# Patient Record
Sex: Female | Born: 1935
Health system: Southern US, Community
[De-identification: ages and names within clinical notes are randomized; demographics above are authoritative.]

## PROBLEM LIST (undated history)

## (undated) DIAGNOSIS — N183 Chronic kidney disease, stage 3 unspecified: Secondary | ICD-10-CM

## (undated) DIAGNOSIS — I2510 Atherosclerotic heart disease of native coronary artery without angina pectoris: Secondary | ICD-10-CM

## (undated) DIAGNOSIS — E785 Hyperlipidemia, unspecified: Secondary | ICD-10-CM

## (undated) DIAGNOSIS — G8929 Other chronic pain: Secondary | ICD-10-CM

## (undated) DIAGNOSIS — M545 Low back pain, unspecified: Secondary | ICD-10-CM

## (undated) DIAGNOSIS — M1990 Unspecified osteoarthritis, unspecified site: Secondary | ICD-10-CM

## (undated) DIAGNOSIS — I10 Essential (primary) hypertension: Secondary | ICD-10-CM

## (undated) DIAGNOSIS — E875 Hyperkalemia: Secondary | ICD-10-CM

## (undated) DIAGNOSIS — H409 Unspecified glaucoma: Secondary | ICD-10-CM

## (undated) DIAGNOSIS — E119 Type 2 diabetes mellitus without complications: Secondary | ICD-10-CM

## (undated) DIAGNOSIS — I5032 Chronic diastolic (congestive) heart failure: Secondary | ICD-10-CM

## (undated) HISTORY — DX: Hyperlipidemia, unspecified: E78.5

## (undated) HISTORY — PX: CATARACT EXTRACTION W/ INTRAOCULAR LENS  IMPLANT, BILATERAL: SHX1307

## (undated) HISTORY — DX: Hyperkalemia: E87.5

## (undated) HISTORY — DX: Unspecified osteoarthritis, unspecified site: M19.90

## (undated) HISTORY — DX: Chronic kidney disease, stage 3 unspecified: N18.30

## (undated) HISTORY — DX: Chronic diastolic (congestive) heart failure: I50.32

## (undated) HISTORY — DX: Morbid (severe) obesity due to excess calories: E66.01

## (undated) HISTORY — PX: TONSILLECTOMY: SUR1361

## (undated) HISTORY — DX: Essential (primary) hypertension: I10

---

## 1998-03-22 ENCOUNTER — Other Ambulatory Visit: Admission: RE | Admit: 1998-03-22 | Discharge: 1998-03-22 | Payer: Self-pay | Admitting: Family Medicine

## 1999-01-11 ENCOUNTER — Other Ambulatory Visit: Admission: RE | Admit: 1999-01-11 | Discharge: 1999-01-11 | Payer: Self-pay | Admitting: Internal Medicine

## 1999-05-15 ENCOUNTER — Ambulatory Visit (HOSPITAL_COMMUNITY): Admission: RE | Admit: 1999-05-15 | Discharge: 1999-05-15 | Payer: Self-pay | Admitting: *Deleted

## 2000-03-23 ENCOUNTER — Encounter: Payer: Self-pay | Admitting: Emergency Medicine

## 2000-03-23 ENCOUNTER — Emergency Department (HOSPITAL_COMMUNITY): Admission: EM | Admit: 2000-03-23 | Discharge: 2000-03-23 | Payer: Self-pay | Admitting: Emergency Medicine

## 2000-06-29 ENCOUNTER — Ambulatory Visit (HOSPITAL_COMMUNITY): Admission: RE | Admit: 2000-06-29 | Discharge: 2000-06-29 | Payer: Self-pay | Admitting: *Deleted

## 2001-02-04 ENCOUNTER — Other Ambulatory Visit: Admission: RE | Admit: 2001-02-04 | Discharge: 2001-02-04 | Payer: Self-pay | Admitting: Family Medicine

## 2001-03-22 ENCOUNTER — Ambulatory Visit (HOSPITAL_COMMUNITY): Admission: RE | Admit: 2001-03-22 | Discharge: 2001-03-22 | Payer: Self-pay | Admitting: Ophthalmology

## 2001-06-04 ENCOUNTER — Ambulatory Visit (HOSPITAL_COMMUNITY): Admission: RE | Admit: 2001-06-04 | Discharge: 2001-06-04 | Payer: Self-pay | Admitting: Ophthalmology

## 2001-07-07 ENCOUNTER — Encounter: Payer: Self-pay | Admitting: Ophthalmology

## 2001-07-09 ENCOUNTER — Ambulatory Visit (HOSPITAL_COMMUNITY): Admission: RE | Admit: 2001-07-09 | Discharge: 2001-07-09 | Payer: Self-pay | Admitting: Ophthalmology

## 2002-04-26 ENCOUNTER — Other Ambulatory Visit: Admission: RE | Admit: 2002-04-26 | Discharge: 2002-04-26 | Payer: Self-pay | Admitting: Family Medicine

## 2003-05-17 ENCOUNTER — Ambulatory Visit (HOSPITAL_COMMUNITY): Admission: RE | Admit: 2003-05-17 | Discharge: 2003-05-17 | Payer: Self-pay | Admitting: Ophthalmology

## 2003-06-09 ENCOUNTER — Ambulatory Visit (HOSPITAL_COMMUNITY): Admission: RE | Admit: 2003-06-09 | Discharge: 2003-06-09 | Payer: Self-pay | Admitting: Internal Medicine

## 2003-09-28 ENCOUNTER — Ambulatory Visit (HOSPITAL_COMMUNITY): Admission: RE | Admit: 2003-09-28 | Discharge: 2003-09-28 | Payer: Self-pay | Admitting: Internal Medicine

## 2004-05-28 ENCOUNTER — Ambulatory Visit: Payer: Self-pay | Admitting: Nurse Practitioner

## 2004-06-03 ENCOUNTER — Ambulatory Visit (HOSPITAL_COMMUNITY): Admission: RE | Admit: 2004-06-03 | Discharge: 2004-06-03 | Payer: Self-pay | Admitting: Internal Medicine

## 2004-08-28 ENCOUNTER — Ambulatory Visit: Payer: Self-pay | Admitting: Nurse Practitioner

## 2004-12-05 ENCOUNTER — Ambulatory Visit: Payer: Self-pay | Admitting: Nurse Practitioner

## 2004-12-13 ENCOUNTER — Ambulatory Visit: Payer: Self-pay | Admitting: Nurse Practitioner

## 2004-12-13 ENCOUNTER — Other Ambulatory Visit: Admission: RE | Admit: 2004-12-13 | Discharge: 2004-12-13 | Payer: Self-pay | Admitting: Family Medicine

## 2004-12-24 ENCOUNTER — Ambulatory Visit (HOSPITAL_COMMUNITY): Admission: RE | Admit: 2004-12-24 | Discharge: 2004-12-24 | Payer: Self-pay | Admitting: Internal Medicine

## 2005-04-04 ENCOUNTER — Emergency Department (HOSPITAL_COMMUNITY): Admission: EM | Admit: 2005-04-04 | Discharge: 2005-04-04 | Payer: Self-pay | Admitting: Emergency Medicine

## 2005-07-28 ENCOUNTER — Ambulatory Visit: Payer: Self-pay | Admitting: Nurse Practitioner

## 2005-08-15 ENCOUNTER — Other Ambulatory Visit: Admission: RE | Admit: 2005-08-15 | Discharge: 2005-08-15 | Payer: Self-pay | Admitting: Family Medicine

## 2005-08-15 ENCOUNTER — Ambulatory Visit: Payer: Self-pay | Admitting: Family Medicine

## 2005-08-20 ENCOUNTER — Ambulatory Visit: Payer: Self-pay | Admitting: Nurse Practitioner

## 2005-09-05 ENCOUNTER — Ambulatory Visit: Payer: Self-pay | Admitting: Family Medicine

## 2005-09-16 ENCOUNTER — Ambulatory Visit (HOSPITAL_COMMUNITY): Admission: RE | Admit: 2005-09-16 | Discharge: 2005-09-16 | Payer: Self-pay | Admitting: Internal Medicine

## 2005-11-28 ENCOUNTER — Ambulatory Visit: Payer: Self-pay | Admitting: Gynecology

## 2005-11-28 ENCOUNTER — Other Ambulatory Visit: Admission: RE | Admit: 2005-11-28 | Discharge: 2005-11-28 | Payer: Self-pay | Admitting: Gynecology

## 2005-12-19 ENCOUNTER — Ambulatory Visit: Payer: Self-pay | Admitting: Nurse Practitioner

## 2005-12-30 ENCOUNTER — Encounter: Admission: RE | Admit: 2005-12-30 | Discharge: 2005-12-30 | Payer: Self-pay

## 2006-01-29 ENCOUNTER — Ambulatory Visit: Payer: Self-pay | Admitting: Nurse Practitioner

## 2008-01-21 ENCOUNTER — Ambulatory Visit (HOSPITAL_COMMUNITY): Admission: RE | Admit: 2008-01-21 | Discharge: 2008-01-21 | Payer: Self-pay | Admitting: Obstetrics and Gynecology

## 2008-01-21 ENCOUNTER — Encounter (INDEPENDENT_AMBULATORY_CARE_PROVIDER_SITE_OTHER): Payer: Self-pay | Admitting: Obstetrics and Gynecology

## 2008-08-29 ENCOUNTER — Encounter: Admission: RE | Admit: 2008-08-29 | Discharge: 2008-08-29 | Payer: Self-pay | Admitting: Internal Medicine

## 2011-01-28 NOTE — Op Note (Signed)
NAMESHAI, RASMUSSEN          ACCOUNT NO.:  [id number]   MEDICAL RECORD NO.:  [id number]          PATIENT TYPE:  AMB   LOCATION:  SDC                           [facility]ACILITY:  [facility]   PHYSICIAN:  Osborn Coho, M.D.   DATE OF BIRTH:  09/09/1936   DATE OF PROCEDURE:  01/21/2008  DATE OF DISCHARGE:                               OPERATIVE REPORT   PREOPERATIVE DIAGNOSES:  1. Atypical glandular cells of undetermined significance.  2. Atypical squamous cells of undetermined significance.  3. Polyp.   POSTOPERATIVE DIAGNOSES:  1. Atypical glandular cells of undetermined significance.  2. Atypical squamous cells of undetermined significance.  3. Polyp.   PROCEDURE:  1. Hysteroscopy.  2. Dilation and curettage.  3. Polypectomy.   ATTENDING:  Osborn Coho, MD   ANESTHESIA:  MAC and paracervical block.   SPECIMENS TO PATHOLOGY:  1. Polyp.  2. Endometrial curettings.   FLUIDS:  1000 mL.   URINE OUTPUT:  Straight cath prior to procedure with quantity  sufficient.  Hysteroscopic fluid deficit of 50 mL of sorbitol.   ESTIMATED BLOOD LOSS:  Minimal.   COMPLICATIONS:  None.   PROCEDURE:  The patient was taken to the operating room after risks,  benefits, and alternatives were reviewed with the patient.  The patient  verbalized understanding and consent was signed and witnessed.  The  patient was given a MAC per anesthesia and prepped and draped in the  normal sterile fashion.  A bivalve speculum was placed in the patient's  vagina and a paracervical block was administered using a total of 20 mL  of 1% lidocaine and the anterior lip of the cervix was grasped with a  single-tooth tenaculum.  The cervix was dilated for passage of the  hysteroscope.  Hysteroscope was introduced and a large polyp noted and  the base was attached to the anterior wall adjacent to the fundus.  Polypectomy was performed and most of the polyp was removed.  A small  portion of the base was still remaining.   Sharp curettage was performed  and the base was then removed and sent to pathology with the rest of the  polyp.  Curettage was performed, so a good texture was noted and  curettings sent to pathology.  Hysteroscope was introduced again and no  obvious remaining polyp was noticed.  Curettage was performed 1 last  time to remove any remaining debris.  The tenaculum was removed and  there was a small amount of bleeding at the right tenaculum site, which  was made hemostatic with silver nitrate and pressure.  All instruments  were removed.  The patient tolerated the procedure well and was awaiting  to transfer to the recovery room in good condition.      Osborn Coho, M.D.  Electronically Signed     AR/MEDQ  D:  01/21/2008  T:  01/22/2008  Job:  [id number]

## 2011-01-31 NOTE — Op Note (Signed)
[place]. [facility]  Patient:    Sarah Weaver, Sarah Weaver Visit Number: [id number] MRN: [id number]          Service Type: DSU Location: [facility] 28[address] 17 Attending Physician:  Karenann Cai Dictated by:   Marya Landry Carlyle Lipa., M.D. Proc. Date: 06/04/01 Admit Date:  06/04/2001                             Operative Report  PREOPERATIVE DIAGNOSIS:  Opaque posterior capsule right eye.  POSTOPERATIVE DIAGNOSIS:  Opaque posterior capsule right eye.  OPERATION:  YAG laser capsulotomy.  JUSTIFICATION FOR PROCEDURE:  This 75 year old lady underwent a cataract extraction on the right eye several months ago.  She has developed opacification of the posterior capsule with a drop in her visual acuity.  She had a visual acuity at this time as best corrected at 20/40.  She complains of difficulty seeing to read.  She was evaluated and found to have an opaque posterior capsule, and YAG laser capsulotomy is recommended. She is admitted at this time for that purpose.  DESCRIPTION OF PROCEDURE:  The patient was brought to the laser room, positioned appropriately behind the laser.  Approximately 13 applications of laser energy at 1.9 millijoules were applied to the posterior capsule obtaining an opening of 3 to 4 mm.  The patient tolerated the procedure well and was discharged in satisfactory condition with instructions to see me in the office this afternoon for evaluation of her intraocular pressure.  DISCHARGE DIAGNOSIS: Opaque posterior right eye. Dictated by:   Marya Landry Carlyle Lipa., M.D. Attending Physician:  Karenann Cai DD:  06/04/01 TD:  06/04/01 Job: [id number]

## 2011-01-31 NOTE — Op Note (Signed)
[place]. [facility]  Patient:    Sarah Weaver, Sarah Weaver Visit Number: [id number] MRN: [id number]          Service Type: DSU Location: [facility] 28[address] 22 Attending Physician:  Karenann Cai Dictated by:   Marya Landry Carlyle Lipa., M.D. Admit Date:  07/09/2001 Discharge Date: 07/09/2001                             Operative Report  PREOPERATIVE DIAGNOSIS:  Immature cataract, left eye.  POSTOPERATIVE DIAGNOSIS:  Immature cataract, left eye.  PROCEDURE:  Kelman phacoemulsification of cataract, left eye.  ANESTHESIA:  Local using Xylocaine 2% with Marcaine 0.75% and Wydase.  JUSTIFICATION FOR PROCEDURE:  This is a 75 year old lady who has had a cataract extraction from the right eye.  She has a cataract present in the left eye with a visual acuity best corrected at 20/60.  She complains of difficulty seeing to read.  Cataract extraction with intraocular lens implantation was recommended, and she is admitted at this time for that purpose.  DESCRIPTION OF PROCEDURE:  Under the influence of IV sedation, the patient was prepped and draped in the usual manner.  The lid speculum was inserted under the upper and lower lid of the left eye, and a 4-0 silk traction suture was passed through the belly of the superior rectus muscle for traction.  A fornix-based conjunctival flap was turned and hemostasis was achieved by using cautery.  An incision made in the sclera approximately 1 mm posterior to the limbus.  This incision was dissected down to clear cornea using the crescent blade.  A side port incision was made at the 1:30 clock hour position. Occucoat was injected into the eye through the side port incision.  The anterior chamber was then entered through the corneoscleral tunnel incision at the 11:30 position.  An anterior capsulotomy was done using a bent 25-gauge needle.  The nucleus was hydrodissected using Xylocaine.  The KPE handpiece was passed  into the eye, and the nucleus was emulsified without difficulty. The residual cortical material was aspirated.  The posterior capsule was polished using the olive-tip polisher.  The wound was then widened slightly to accommodate a foldable silicone lens.  This was injected into the eye behind the iris without difficulty.  The anterior chamber was reformed, and the pupil was constricted using Miochol.  The lips of the wound were hydrated with saline.  The wound was tested to make sure that it was airtight and watertight.  The conjunctiva was then closed over the wounds using thermal cautery.  Celestone 1 cc and 0.5 cc of gentamicin were injected subconjunctivally.  Maxitrol ophthalmic ointment and Pilopine ointment were applied along with a patch and Fox shield.  The patient tolerated the procedure well, was discharged to postanesthesia recovery room in satisfactory condition.  She was instructed to rest today, to take Vicodin every four hours as needed for pain, and to see me in the office tomorrow for further evaluation.  DISCHARGE DIAGNOSIS:  Immature cataract, left eye. Dictated by:   Marya Landry Carlyle Lipa., M.D. Attending Physician:  Karenann Cai DD:  07/10/01 TD:  07/12/01 Job: [id number]

## 2011-01-31 NOTE — Group Therapy Note (Signed)
NAMEDENNIS, Sarah Weaver          ACCOUNT NO.:  [id number]   MEDICAL RECORD NO.:  [id number]          PATIENT TYPE:  WOC   LOCATION:  [facility]                   FACILITY:  [facility]   PHYSICIAN:  Tinnie Gens, MD        DATE OF BIRTH:  Dec 12, 1935   DATE OF SERVICE:  09/05/2005                                    CLINIC NOTE   CHIEF COMPLAINT:  Follow-up biopsy results.   HISTORY OF PRESENT ILLNESS:  Patient is a 75 year old para 3, who was  referred from [facility] for evaluation and treatment of  postmenopausal bleeding.  On her last visit, she underwent endometrial  biopsy sampling, which revealed simple hyperplasia without atypia associated  with an endometrial polyp.   PHYSICAL EXAMINATION:  VITAL SIGNS:  As noted in the chart.  GENERAL: She is an obese black female in no acute distress.   IMPRESSION:  Simple hyperplasia and endometrial polyp.   PLAN:  Will try a chemical D&C given her poor health and probably not a good  surgical candidate with Provera 10 mg p.o. q. day for the next three months  followed by a follow-up endometrial biopsy at that time.  If this fails, D&C  would probably be the next option.  We will make that determination at her  next biopsy.           ______________________________  Tinnie Gens, MD     TP/MEDQ  D:  09/05/2005  T:  09/08/2005  Job:  [id number]   cc:   [facility]  [address]

## 2011-01-31 NOTE — Group Therapy Note (Signed)
Sarah Weaver, Sarah Weaver          ACCOUNT NO.:  [id number]   MEDICAL RECORD NO.:  [id number]          PATIENT TYPE:  WOC   LOCATION:  [facility]                   [facility]ACILITY[facility]   PHYSICIAN:  Tinnie Gens, MD        DATE OF BIRTH:  1936/06/04   DATE OF SERVICE:                                    CLINIC NOTE   CHIEF COMPLAINT:  Postmenopausal bleeding.   HISTORY OF PRESENT ILLNESS:  Patient is a 75 year old, para 3, who is  referred from [facility] for postmenopausal bleeding.  The patient  underwent pelvic sonography, which revealed an endometrial stripe of 1.8 cm,  and an endometrial biopsy was suggested.  The patient underwent this  ultrasound in April of 2006, and apparently, the result was just found by  her physician, and she is referred over for a biopsy today.   PAST MEDICAL HISTORY:  Her past medical history is fairly complicated, and  the patient did not fill out a GYN history form today.  However, she clearly  has elevated blood pressure, diabetes, and elevated cholesterol.   MEDICATIONS:  1.  Atenolol.  2.  Hydrochlorothiazide.  3.  Possibly Glyburide, but may be other medications.  She did not bring      them with her today.   ALLERGIES:  None known.   GYNECOLOGY HISTORY:  Underwent menopause in 1992, she believes, at age 41.  Last Pap was in 2005 and was normal.  Last mammogram was also in 2005 and  normal.   PHYSICAL EXAMINATION:  VITAL SIGNS: On exam today, her blood pressure is  184/98 without taking her meds.  Weight is 313 pounds.  GENERAL: She is an obese black female in no acute distress.  ABDOMEN:  Soft, non-tender, non-distended.  GENITOURINARY:  She has normal external female genitalia.  The vagina is  pink.  The cervix is parous and without lesions.  The uterus sounds to  approximately 7 cm.  Endometrial biopsy is taken without difficulty.   IMPRESSION:  1.  Postmenopausal bleeding.  2.  Multiple other medical problems.   PLAN:  1.   Endometrial biopsy today.  2.  Follow-up results in two weeks and then treatment after that.           ______________________________  Tinnie Gens, MD     TP/MEDQ  D:  08/15/2005  T:  08/16/2005  Job:  [id number]

## 2011-01-31 NOTE — Op Note (Signed)
[place]. [facility]  Patient:    Sarah Weaver, Sarah Weaver                   MRN: [id number] Proc. Date: 03/23/01 Attending:  Marya Landry. Carlyle Lipa., M.D.                           Operative Report  PREOPERATIVE DIAGNOSIS:  Immature cataract, right eye.  POSTOPERATIVE DIAGNOSIS:  Immature cataract, right eye.  OPERATION PERFORMED:  Kelman phacoemulsification of cataract, right eye, with intraocular lens implantation.  SURGEON:  Marya Landry. Carlyle Lipa., M.D.  ANESTHESIA:  Local using Xylocaine 2%, Marcaine 0.75% with Wydase.  INDICATIONS FOR PROCEDURE:  The patient is a 75 year old diabetic female who complains of blurring of vision with difficulty seeing to read and recognize faces.  Her vision is particularly blurred in the bright sun.  She was evaluated and found to have visual acuity best corrected at 20/60 on the right and 20/50 on the left.  There were bilateral posterior subcapsular cataract slightly worse on the right than on the left.  Cataract extraction with intraocular lens implantation in the right eye was recommended and she was admitted now for that purpose.  DESCRIPTION OF PROCEDURE:  Under the influence of IV sedation, the van Lint akinesia retrobulbar anesthesia was given.  The patient was prepped and draped in the usual manner.  The lid speculum was inserted under the upper and lower lid of the right eye and a 4-0 silk  traction suture was passed through the belly of the superior rectus muscle for traction.  A fornix based conjunctival flap was turned and hemostasis was used with cautery.  A grooved incision was made in the sclera approximately 1 mm posterior to the limbus.  This incision was dissected down into the clear cornea using crescent blade.  A side port incision was made at the 1:30 oclock position.  OcuCoat was injected into the eye through the side port incision.  The anterior chamber was then entered through the  corneoscleral tunnel incision at the 11:30 oclock position. Anterior capsulotomy was done using a bent 25 gauge needle.  The nucleus was hydrodissected using Xylocaine.  The KPE handpiece was passed into the eye and the nucleus was emulsified without difficulty.  The residual cortical material was aspirated.  The posterior capsule was polished using an olive tip polisher.  The wound was then widened slightly to accommodate a 5 x 6 oval intraocular lens.  This lens was seated into the eye behind the iris without difficulty.  The anterior chamber was reformed and the pupil was constricted using  Miochol.  The residual OcuCoat was aspirated from the eye.  The corneoscleral wound was then closed by using a single horizontal suture of 10-0 nylon.  It was ascertained that the wound was airtight and watertight and the conjunctiva was closed using thermal cautery.  1 cc of Celestone and 0.5 cc of gentamicin were injected subconjunctivally.  Maxitrol ophthalmic ointment and Pilopine ointment were applied along with a patch and Fox shield. The patient tolerated the procedure well and was discharged to the post anesthesia care unit in satisfactory condition.  She was instructed to rest today, to take Vicodin every four hours as needed for pain and to see me in the office tomorrow for further evaluation.  DISCHARGE DIAGNOSIS:  Immature cataract right eye.DD:  03/23/01 TD:  03/23/01 Job: [id number]

## 2011-01-31 NOTE — Group Therapy Note (Signed)
Sarah Weaver, Sarah Weaver          ACCOUNT NO.:  [id number]   MEDICAL RECORD NO.:  [id number]          PATIENT TYPE:  WOC   LOCATION:  [facility]                   FACILITY:  [facility]   PHYSICIAN:  Ginger Carne, MD DATE OF BIRTH:  Jan 23, 1936   DATE OF SERVICE:                                    CLINIC NOTE   REASON FOR CONSULTATION:  Followup for management of simple hyperplasia of  the endometrium without atypia.   HISTORY OF PRESENT ILLNESS:  This is a 75 year old para 3, who has undergone  a 28-month treatment with Provera 10 mg daily for non-atypical simple  hyperplasia of the endometrium.  The patient reports no active bleeding at  this time.   PHYSICAL EXAMINATION:  VITAL SIGNS:  Per chart.  PELVIC:  Cervix smooth without erosions.  A very small polyp at the 10  o'clock position was noted measuring about 3-4 mm and it was elected to  leave said polyp alone at this time.  An endometrial biopsy was obtained,  scant tissue noted.   IMPRESSION:  Non-atypical simple hyperplasia of the endometrium.   PLAN:  Endometrial biopsy sample will be sent to pathology and results  obtained.  If the patient's hyperplasia resolves, Provera will be  discontinued.  If hyperplasia is still present and/or bleeding recurs,  further management will be explored.  Dr. Shawnie Pons will be notified of said  findings.  The small polyp will also be discussed with Dr. Shawnie Pons.  From a  practical standpoint, it is unlikely this represents any significant lesion.           ______________________________  Ginger Carne, MD     SHB/MEDQ  D:  11/28/2005  T:  11/29/2005  Job:  [id number]

## 2011-03-21 ENCOUNTER — Emergency Department (HOSPITAL_COMMUNITY): Payer: PRIVATE HEALTH INSURANCE

## 2011-03-21 ENCOUNTER — Inpatient Hospital Stay (HOSPITAL_COMMUNITY)
Admission: EM | Admit: 2011-03-21 | Discharge: 2011-03-23 | DRG: 444 | Disposition: A | Discharge status: Home / Self Care | Payer: PRIVATE HEALTH INSURANCE | Attending: Family Medicine | Admitting: Family Medicine

## 2011-03-21 DIAGNOSIS — Z7982 Long term (current) use of aspirin: Secondary | ICD-10-CM

## 2011-03-21 DIAGNOSIS — K859 Acute pancreatitis without necrosis or infection, unspecified: Secondary | ICD-10-CM | POA: Diagnosis present

## 2011-03-21 DIAGNOSIS — N390 Urinary tract infection, site not specified: Secondary | ICD-10-CM | POA: Diagnosis present

## 2011-03-21 DIAGNOSIS — E785 Hyperlipidemia, unspecified: Secondary | ICD-10-CM | POA: Diagnosis present

## 2011-03-21 DIAGNOSIS — IMO0002 Reserved for concepts with insufficient information to code with codable children: Secondary | ICD-10-CM | POA: Diagnosis present

## 2011-03-21 DIAGNOSIS — I10 Essential (primary) hypertension: Secondary | ICD-10-CM | POA: Diagnosis present

## 2011-03-21 DIAGNOSIS — R42 Dizziness and giddiness: Secondary | ICD-10-CM | POA: Diagnosis present

## 2011-03-21 DIAGNOSIS — E860 Dehydration: Secondary | ICD-10-CM | POA: Diagnosis present

## 2011-03-21 DIAGNOSIS — K8020 Calculus of gallbladder without cholecystitis without obstruction: Principal | ICD-10-CM | POA: Diagnosis present

## 2011-03-21 DIAGNOSIS — K7689 Other specified diseases of liver: Secondary | ICD-10-CM | POA: Diagnosis present

## 2011-03-21 DIAGNOSIS — E1165 Type 2 diabetes mellitus with hyperglycemia: Secondary | ICD-10-CM | POA: Diagnosis present

## 2011-03-22 ENCOUNTER — Inpatient Hospital Stay (HOSPITAL_COMMUNITY): Payer: PRIVATE HEALTH INSURANCE

## 2011-03-22 LAB — URINE MICROSCOPIC-ADD ON

## 2011-03-22 LAB — COMPREHENSIVE METABOLIC PANEL
ALT: 22 U/L (ref 0–35)
AST: 19 U/L (ref 0–37)
Albumin: 3.5 g/dL (ref 3.5–5.2)
Alkaline Phosphatase: 77 U/L (ref 39–117)
BUN: 16 mg/dL (ref 6–23)
CO2: 24 mEq/L (ref 19–32)
Calcium: 9.3 mg/dL (ref 8.4–10.5)
Chloride: 100 mEq/L (ref 96–112)
Creatinine, Ser: 0.89 mg/dL (ref 0.50–1.10)
GFR calc Af Amer: >60 mL/min (ref >60)
GFR calc non Af Amer: >60 mL/min (ref >60)
Glucose, Bld: 214 mg/dL — ABNORMAL HIGH (ref 70–99)
Potassium: 4.6 mEq/L (ref 3.5–5.1)
Sodium: 135 mEq/L (ref 135–145)
Total Bilirubin: 0.3 mg/dL (ref 0.3–1.2)
Total Protein: 7 g/dL (ref 6.0–8.3)

## 2011-03-22 LAB — BASIC METABOLIC PANEL
BUN: 14 mg/dL (ref 6–23)
CO2: 28 mEq/L (ref 19–32)
Calcium: 9 mg/dL (ref 8.4–10.5)
Chloride: 102 mEq/L (ref 96–112)
Creatinine, Ser: 0.86 mg/dL (ref 0.50–1.10)
GFR calc Af Amer: >60 mL/min (ref >60)
GFR calc non Af Amer: >60 mL/min (ref >60)
Glucose, Bld: 210 mg/dL — ABNORMAL HIGH (ref 70–99)
Potassium: 4.4 mEq/L (ref 3.5–5.1)
Sodium: 138 mEq/L (ref 135–145)

## 2011-03-22 LAB — DIFFERENTIAL
Basophils Absolute: 0 10*3/uL (ref 0.0–0.1)
Basophils Absolute: 0 10*3/uL (ref 0.0–0.1)
Basophils Relative: 0 % (ref 0–1)
Basophils Relative: 0 % (ref 0–1)
Eosinophils Absolute: 0 10*3/uL (ref 0.0–0.7)
Eosinophils Absolute: 0 10*3/uL (ref 0.0–0.7)
Eosinophils Relative: 0 % (ref 0–5)
Eosinophils Relative: 1 % (ref 0–5)
Lymphocytes Relative: 18 % (ref 12–46)
Lymphocytes Relative: 41 % (ref 12–46)
Lymphs Abs: 1.3 10*3/uL (ref 0.7–4.0)
Lymphs Abs: 1.6 10*3/uL (ref 0.7–4.0)
Monocytes Absolute: 0.3 10*3/uL (ref 0.1–1.0)
Monocytes Absolute: 0.4 10*3/uL (ref 0.1–1.0)
Monocytes Relative: 5 % (ref 3–12)
Monocytes Relative: 7 % (ref 3–12)
Neutro Abs: 2 10*3/uL (ref 1.7–7.7)
Neutro Abs: 5.3 10*3/uL (ref 1.7–7.7)
Neutrophils Relative %: 51 % (ref 43–77)
Neutrophils Relative %: 76 % (ref 43–77)

## 2011-03-22 LAB — CBC
HCT: 33.1 % — ABNORMAL LOW (ref 36.0–46.0)
HCT: 35.7 % — ABNORMAL LOW (ref 36.0–46.0)
Hemoglobin: 11 g/dL — ABNORMAL LOW (ref 12.0–15.0)
Hemoglobin: 12.1 g/dL (ref 12.0–15.0)
MCH: 28.4 pg (ref 26.0–34.0)
MCH: 28.7 pg (ref 26.0–34.0)
MCHC: 33.2 g/dL (ref 30.0–36.0)
MCHC: 33.9 g/dL (ref 30.0–36.0)
MCV: 84.8 fL (ref 78.0–100.0)
MCV: 85.3 fL (ref 78.0–100.0)
Platelets: 199 10*3/uL (ref 150–400)
Platelets: 206 10*3/uL (ref 150–400)
RBC: 3.88 MIL/uL (ref 3.87–5.11)
RBC: 4.21 MIL/uL (ref 3.87–5.11)
RDW: 12.8 % (ref 11.5–15.5)
RDW: 12.9 % (ref 11.5–15.5)
WBC: 3.9 10*3/uL — ABNORMAL LOW (ref 4.0–10.5)
WBC: 7 10*3/uL (ref 4.0–10.5)

## 2011-03-22 LAB — CARDIAC PANEL(CRET KIN+CKTOT+MB+TROPI)
CK, MB: 2 ng/mL (ref 0.3–4.0)
CK, MB: 2.2 ng/mL (ref 0.3–4.0)
Relative Index: 1.8 (ref 0.0–2.5)
Relative Index: 1.6 (ref 0.0–2.5)
Total CK: 113 U/L (ref 7–177)
Total CK: 134 U/L (ref 7–177)
Troponin I: <0.3 ng/mL (ref <0.30)
Troponin I: <0.3 ng/mL (ref <0.30)

## 2011-03-22 LAB — URINE CULTURE
Colony Count: NO GROWTH
Culture  Setup Time: 201207070059
Culture: NO GROWTH

## 2011-03-22 LAB — HEMOGLOBIN A1C
Hgb A1c MFr Bld: 7.9 % — ABNORMAL HIGH (ref <5.7)
Mean Plasma Glucose: 180 mg/dL — ABNORMAL HIGH (ref <117)

## 2011-03-22 LAB — URINALYSIS, ROUTINE W REFLEX MICROSCOPIC
Bilirubin Urine: NEGATIVE
Glucose, UA: NEGATIVE mg/dL
Ketones, ur: NEGATIVE mg/dL
Nitrite: NEGATIVE
Protein, ur: 30 mg/dL — AB
Specific Gravity, Urine: 1.023 (ref 1.005–1.030)
Urobilinogen, UA: 0.2 mg/dL (ref 0.0–1.0)
pH: 5 (ref 5.0–8.0)

## 2011-03-22 LAB — GLUCOSE, CAPILLARY
Glucose-Capillary: 173 mg/dL — ABNORMAL HIGH (ref 70–99)
Glucose-Capillary: 133 mg/dL — ABNORMAL HIGH (ref 70–99)
Glucose-Capillary: 166 mg/dL — ABNORMAL HIGH (ref 70–99)

## 2011-03-22 LAB — CK TOTAL AND CKMB (NOT AT ARMC)
CK, MB: 1.8 ng/mL (ref 0.3–4.0)
Relative Index: 1.7 (ref 0.0–2.5)
Total CK: 108 U/L (ref 7–177)

## 2011-03-22 LAB — LIPID PANEL
Cholesterol: 166 mg/dL (ref 0–200)
HDL: 60 mg/dL (ref >39)
LDL Cholesterol: 90 mg/dL (ref 0–99)
Total CHOL/HDL Ratio: 2.8 RATIO
Triglycerides: 80 mg/dL (ref <150)
VLDL: 16 mg/dL (ref 0–40)

## 2011-03-22 LAB — LIPASE, BLOOD: Lipase: 70 U/L — ABNORMAL HIGH (ref 11–59)

## 2011-03-22 LAB — PROTIME-INR
INR: 1.03 (ref 0.00–1.49)
Prothrombin Time: 13.7 seconds (ref 11.6–15.2)

## 2011-03-22 LAB — APTT: aPTT: 29 seconds (ref 24–37)

## 2011-03-22 LAB — TSH: TSH: 2.043 u[IU]/mL (ref 0.350–4.500)

## 2011-03-23 LAB — CBC
HCT: 33.9 % — ABNORMAL LOW (ref 36.0–46.0)
Hemoglobin: 11.1 g/dL — ABNORMAL LOW (ref 12.0–15.0)
MCH: 28.2 pg (ref 26.0–34.0)
MCHC: 32.7 g/dL (ref 30.0–36.0)
MCV: 86 fL (ref 78.0–100.0)
Platelets: 181 10*3/uL (ref 150–400)
RBC: 3.94 MIL/uL (ref 3.87–5.11)
RDW: 12.9 % (ref 11.5–15.5)
WBC: 3.8 10*3/uL — ABNORMAL LOW (ref 4.0–10.5)

## 2011-03-23 LAB — DIFFERENTIAL
Basophils Absolute: 0 10*3/uL (ref 0.0–0.1)
Basophils Relative: 0 % (ref 0–1)
Eosinophils Absolute: 0.1 10*3/uL (ref 0.0–0.7)
Eosinophils Relative: 2 % (ref 0–5)
Lymphocytes Relative: 48 % — ABNORMAL HIGH (ref 12–46)
Lymphs Abs: 1.8 10*3/uL (ref 0.7–4.0)
Monocytes Absolute: 0.4 10*3/uL (ref 0.1–1.0)
Monocytes Relative: 11 % (ref 3–12)
Neutro Abs: 1.5 10*3/uL — ABNORMAL LOW (ref 1.7–7.7)
Neutrophils Relative %: 39 % — ABNORMAL LOW (ref 43–77)

## 2011-03-23 LAB — COMPREHENSIVE METABOLIC PANEL
ALT: 20 U/L (ref 0–35)
AST: 18 U/L (ref 0–37)
Albumin: 3.1 g/dL — ABNORMAL LOW (ref 3.5–5.2)
Alkaline Phosphatase: 64 U/L (ref 39–117)
BUN: 9 mg/dL (ref 6–23)
CO2: 27 mEq/L (ref 19–32)
Calcium: 8.7 mg/dL (ref 8.4–10.5)
Chloride: 104 mEq/L (ref 96–112)
Creatinine, Ser: 0.88 mg/dL (ref 0.50–1.10)
GFR calc Af Amer: >60 mL/min (ref >60)
GFR calc non Af Amer: >60 mL/min (ref >60)
Glucose, Bld: 136 mg/dL — ABNORMAL HIGH (ref 70–99)
Potassium: 4 mEq/L (ref 3.5–5.1)
Sodium: 139 mEq/L (ref 135–145)
Total Bilirubin: 0.3 mg/dL (ref 0.3–1.2)
Total Protein: 6.1 g/dL (ref 6.0–8.3)

## 2011-03-23 LAB — GLUCOSE, CAPILLARY
Glucose-Capillary: 145 mg/dL — ABNORMAL HIGH (ref 70–99)
Glucose-Capillary: 257 mg/dL — ABNORMAL HIGH (ref 70–99)

## 2011-03-23 LAB — LIPASE, BLOOD: Lipase: 50 U/L (ref 11–59)

## 2011-03-23 LAB — HEMOGLOBIN A1C
Hgb A1c MFr Bld: 8.1 % — ABNORMAL HIGH (ref <5.7)
Mean Plasma Glucose: 186 mg/dL — ABNORMAL HIGH (ref <117)

## 2011-03-23 LAB — AMYLASE: Amylase: 55 U/L (ref 0–105)

## 2011-03-25 NOTE — Discharge Summary (Signed)
NAMESAKARA, Sarah Weaver          ACCOUNT NO.:  [id number]  MEDICAL RECORD NO.:  [id number]  LOCATION:  2019                         FACILITY:  [facility]  PHYSICIAN:  Standley Dakins, MD   DATE OF BIRTH:  Oct 10, 1935  DATE OF ADMISSION:  03/21/2011 DATE OF DISCHARGE:                              DISCHARGE SUMMARY   PRIMARY CARE PHYSICIAN:  Sarah Goo, MD  DISCHARGE DIAGNOSES: 1. Cholelithiasis - NO CHOLECYSTITIS DETERMINED. 2. Pancreatitis - acute secondary to cholelithiasis - resolved. 3. Morbid obesity. 4. Hypertension - poorly controlled. 5. Poorly controlled type 2 diabetes mellitus, non-insulin requiring. 6. Dyslipidemia. 7. Allergies. 8. Steatosis of the liver.  DISCHARGE MEDICATIONS: 1. Ciprofloxacin 500 mg 1 p.o. b.i.d. times additional 3 days. 2. Avapro 300 mg 1 p.o. daily. 3. Atenolol 50 mg 2 tabs p.o. daily. 4. Metformin 1000 mg p.o. b.i.d. 5. Clonidine 0.1 mg 0.5 tabs p.o. b.i.d. 6. Procardia XL 90 mg 1 p.o. daily. 7. Januvia 50 mg 1 p.o. q.a.m. 8. Simvastatin 40 mg 1 p.o. daily. 9. Aspirin 81 mg 1 p.o. daily. 10.Promethazine 25 mg 1 p.o. b.i.d. p.r.n. 11.Metoclopramide 10 mg 1 p.o. t.i.d. 12.Klor-Con 10 mEq 1 p.o. q.a.m. 13.Loratadine 10 mg 1 p.o. daily. 14.Glimepiride 1 p.o. q.a.m. 15.Hydrochlorothiazide 25 mg p.o. q.a.m.  HOSPITAL COURSE:  Briefly, this patient is a 75 year old female who presented to the emergency department with nausea and vomiting.  She was evaluated in the emergency department and had negative findings for acute intracranial hemorrhage and mass lesion.  No acute infarction noted.  The patient had results that revealed a mild urinary tract infection and an elevated lipase level at 70.  The patient was admitted to the hospital with diagnosis of pancreatitis.  She was treated with clear liquids and IV fluids for dehydration.  She was treated with Rocephin for the urinary tract infection.  She improved considerably.    I ordered  an ultrasound of her abdomen that revealed hepatic steatosis and cholelithiasis in the gallbladder, but NO cholecystitis.  The patient's lipase improved to normal levels on day of discharge and the patient tolerated regular diet and improved considerably.  The nausea and vomiting had also resolved.  At that point, the patient was deemed stable for discharge.  I explained to the patient that she will most likely need to be evaluated by surgeon for consideration of cholecystectomy.  Because she has multiple gallstones in the gallbladder, I advised her to avoid high fatty meals because that could irritate her gallbladder and could possibly cause the gallbladder attack.  The patient verbalized understanding.  She said that she would speak with her primary care physician to discuss having her gallbladder removed.  DISCHARGE CONDITION:  Stable.  DISPOSITION:  The patient will be discharged home with family members.  ACTIVITY: 1. Ad lib.  Fall precautions recommended. 2. Diet low fat cardiac and diabetic diet recommended3. Followup with Dr. Lovell Sheehan her primary care physician in 3-5 days. 4. Drink extra fluids.  SPECIAL INSTRUCTIONS: 1. Please discuss being referred to a surgeon for evaluation for     cholecystectomy with your primary care physician DR. JENKINS. 2. Avoid high fatty meals because this can aggravate gallstones and     cause a gallbladder  attack. 3. Return if symptoms recur, worsen or new changes develop.  The     patient verbalized understanding to these instructions. 4. Check blood sugars 1-2 times per day. 5. Discuss with your primary care physician about instituting basal     insulin to help control diabetes better. 6. Monitor blood pressure at least one time per day.  Record them and     bring them to the primary care physician's office for visit.  The     patient was evaluated on the day of discharge.     She was evaluated on March 23, 2011.    The patient was in no  distress, cooperative and pleasant.  She reported  that her dizziness has improved, but is still there.  She is not having   any abdominal pain and the nausea and vomiting has resolved.  PHYSICAL EXAMINATION:  VITAL SIGNS:  Were reviewed temperature 98.1, pulse 64, respirations 19, blood pressure 146/64 and pulse ox 97% on 2 L nasal cannula. GENERAL:  Well-developed, well-nourished female.  She is awake in no distress, cooperative and pleasant. HEENT:  Normocephalic, atraumatic.  Mucous membranes moist.  Oropharynx clear.  Nares clear. NECK:  Supple.  No lymphadenopathy, no JVD and no adenopathy.  Trachea midline.  Thyroid soft, no nodules or masses palpated. CARDIOVASCULAR:  Normal S1 and S2.  Sounds without murmurs, rubs or gallops. LUNGS:  Bilateral breath sounds.  Clear to auscultation. ABDOMEN:  Obese, soft, bowel sounds present, nontender and no masses palpated.  No hepatosplenomegaly appreciated. EXTREMITIES:  No cyanosis, clubbing or edema. NEUROLOGICAL:  No focal deficits.  LABORATORY DATA:  White blood cell count 3.8, hemoglobin 11.1, hematocrit 33.9, platelet count 181.  Lipase 50, amylase 55.  Sodium 139, potassium 4.0, chloride 104, bicarb 27, BUN 9, creatinine 0.88, AST 18, ALT 20, albumin 3.1, calcium 8.7.  IMPRESSION:  The patient is evaluated and deemed stable for discharge with close followup with her primary care provider.  I spent 37 minutes preparing this patient's discharge including reviewing her medical records, laboratory data, reconciling medications.     Standley Dakins, MD     CJ/MEDQ  D:  03/23/2011  T:  03/23/2011  Job:  [id number]  cc:   Sarah Weaver, M.D.  Electronically Signed by Standley Dakins  on 03/25/2011 06:49:13 PM

## 2011-04-07 NOTE — H&P (Signed)
NAMEAYRIEL, TEXIDOR          ACCOUNT NO.:  [id number]  MEDICAL RECORD NO.:  [id number]  LOCATION:                                 FACILITY:  PHYSICIAN:  Della Goo, M.D. DATE OF BIRTH:  1936-05-14  DATE OF ADMISSION:  03/22/2011 DATE OF DISCHARGE:                             HISTORY & PHYSICAL   PRIMARY CARE PHYSICIAN:  Della Goo, MD  CHIEF COMPLAINT:  Nausea, vomiting.  HISTORY OF PRESENT ILLNESS:  This is a 75 year old female with a history of uncontrolled diabetes and uncontrolled hypertension who presents to the emergency department with complaints of worsening nausea and vomiting over the past 2 weeks.  She denies having any abdominal pain. She denies having any fevers or chills.  She also denies having any diarrhea or constipation.  The patient reports being able to hold down a little bit food today prior to coming to the emergency department.  The patient also reports having dizziness and feeling as if she has motion sickness.  She denies having any ear pain or nasal congestion.  The patient was seen and evaluated in the emergency department and a CAT scan of her head was performed, results of which were negative for findings of acute intracranial hemorrhage, mass lesion, or acute infarction.  Laboratory studies were also performed, results of which revealed a mild urinary tract infection versus contamination and an elevated lipase level at 70, the normal range is 11-59.  The patient was referred for medical admission.  PAST MEDICAL HISTORY:  Significant for: 1. Uncontrolled hypertension. 2. Uncontrolled diabetes, type 2. 3. Morbid obesity. 4. Dyslipidemia. 5. Allergies.  MEDICATIONS:  Aspirin, atenolol, Avapro, clonidine, glimepiride, Glucophage, HydroDIURIL, Januvia, Klor-Con, loratadine, metoclopramide, Phenergan, Procardia, and simvastatin.  ALLERGIES:  No known drug allergies.  SOCIAL HISTORY:  The patient is a nonsmoker, nondrinker.  No  history of illicit drug usage.  FAMILY HISTORY:  Noncontributory.  REVIEW OF SYSTEMS:  Pertinents as mentioned above.  PHYSICAL EXAMINATION FINDINGS:  GENERAL:  This is a 74 year old morbidly obese African American female who is in no visible discomfort or acute distress. VITAL SIGNS:  Temperature 98.0, blood pressure 149/81, heart rate 75, respirations 20, O2 sats 85% initially, then 97-98%. HEENT:  Normocephalic, atraumatic.  Pupils equally round and reactive to light.  Extraocular movements are intact.  Funduscopic benign.  There is no scleral icterus.  Nares are patent bilaterally.  Oropharynx is clear. Tympanic membranes were also clear but small fluid levels were seen. NECK:  Supple.  Full range of motion.  No thyromegaly, adenopathy, jugular venous distention. CARDIOVASCULAR:  Regular rate and rhythm.  No murmurs, gallops, or rubs appreciated. LUNGS:  Clear to auscultation bilaterally.  No rales, rhonchi, or wheezes. ABDOMEN:  Positive bowel sounds, soft, nontender, nondistended.  No hepatosplenomegaly. EXTREMITIES:  Without cyanosis, clubbing, or edema. NEUROLOGIC:  Nonfocal.  LABORATORY STUDIES:  White blood cell count 7.0, hemoglobin 12.1, hematocrit 35.7, MCV 84.8, platelets 206, neutrophils 76%, lymphocytes 18%, and a urinalysis as mentioned above.  Sodium 135, potassium 4.6, chloride 100, CO2 is 24, BUN 16, creatinine 0.89, and glucose 214, lipase 70.  Pro time 13.7, INR 1.03, PTT 29.  ASSESSMENT:  This is a 75 year old female being admitted with: 1.  Nausea and vomiting. 2. Uncontrolled type 2 diabetes mellitus. 3. Uncontrolled hypertension. 4. Urinary tract infection. 5. Morbid obesity. 6. Dizziness/vertigo.  PLAN:  The patient will be admitted to telemetry for monitoring. Cardiac enzymes will be performed.  The patient will be placed on antibiotic therapy to cover a urinary tract infection and her regular medications will be further reconciled and sliding  scale insulin coverage will also be ordered.  DVT prophylaxis has also been ordered. Further workup will ensue pending results and the patient's clinical course.     Della Goo, M.D.   ______________________________ Della Goo, M.D.    HJ/MEDQ  D:  03/22/2011  T:  03/22/2011  Job:  [id number]  Electronically Signed by Della Goo M.D. on 04/07/2011 12:05:00 PM

## 2011-04-14 ENCOUNTER — Ambulatory Visit (INDEPENDENT_AMBULATORY_CARE_PROVIDER_SITE_OTHER): Payer: PRIVATE HEALTH INSURANCE | Admitting: General Surgery

## 2011-04-14 ENCOUNTER — Encounter (INDEPENDENT_AMBULATORY_CARE_PROVIDER_SITE_OTHER): Payer: Self-pay | Admitting: General Surgery

## 2011-04-14 DIAGNOSIS — K8020 Calculus of gallbladder without cholecystitis without obstruction: Secondary | ICD-10-CM | POA: Insufficient documentation

## 2011-04-14 NOTE — Progress Notes (Signed)
Subjective:     Patient ID: Sarah Weaver, female   DOB: 08-18-1936, 75 y.o.   MRN: [id number]  HPI We are asked to see the patient comes placement l Lovell Sheehan to allow her for gallstones. Chief complaint is nausea and vomiting. The patient is a 75 year old black female who has been experiencing intermittent nausea and vomiting for the last month or so. She denies any abdominal pain. She was actually hospitalized a couple weeks ago for this. As part of her workup she underwent an ultrasound. This showed stones in the gallbladder but no gallbladder wall thickening or ductal dilatation.  Review of Systems  Constitutional: Negative.   HENT: Negative.   Eyes: Negative.   Respiratory: Negative.   Cardiovascular: Negative.   Gastrointestinal: Negative.   Genitourinary: Negative.   Musculoskeletal: Negative.   Skin: Negative.   Neurological: Negative.   Hematological: Negative.   Psychiatric/Behavioral: Negative.    Past Medical History  Diagnosis Date  . Hyperlipidemia   . Hypertension   . Glaucoma   . Arthritis    Past Surgical History  Procedure Date  . Tonsillectomy    Current outpatient prescriptions:atenolol (TENORMIN) 50 MG tablet, Take 50 mg by mouth 2 (two) times daily.  , Disp: , Rfl: ;  brinzolamide (AZOPT) 1 % ophthalmic suspension, 1 drop every 3 (three) days.  , Disp: , Rfl: ;  cloNIDine (CATAPRES) 0.1 MG tablet, Take 0.1 mg by mouth 2 (two) times daily.  , Disp: , Rfl: ;  glimepiride (AMARYL) 4 MG tablet, Take 4 mg by mouth daily before breakfast.  , Disp: , Rfl:  hydrochlorothiazide 25 MG tablet, Take 25 mg by mouth daily.  , Disp: , Rfl: ;  HYDROcodone-acetaminophen (VICODIN) 5-500 MG per tablet, Take 1 tablet by mouth every 6 (six) hours as needed.  , Disp: , Rfl: ;  irbesartan (AVAPRO) 300 MG tablet, Take 300 mg by mouth at bedtime.  , Disp: , Rfl: ;  latanoprost (XALATAN) 0.005 % ophthalmic solution, 1 drop as needed. Every 12 days  , Disp: , Rfl:  loratadine  (CLARITIN) 10 MG tablet, Take 10 mg by mouth daily.  , Disp: , Rfl: ;  metFORMIN (GLUMETZA) 1000 MG (MOD) 24 hr tablet, Take 1,000 mg by mouth 2 (two) times daily with a meal.  , Disp: , Rfl: ;  NIFEdipine (ADALAT CC) 90 MG 24 hr tablet, Take 90 mg by mouth daily.  , Disp: , Rfl: ;  potassium chloride (KLOR-CON) 10 MEQ CR tablet, Take 10 mEq by mouth daily.  , Disp: , Rfl:  simvastatin (ZOCOR) 40 MG tablet, Take 40 mg by mouth at bedtime.  , Disp: , Rfl: ;  sitaGLIPtin (JANUVIA) 50 MG tablet, Take 50 mg by mouth daily.  , Disp: , Rfl: ;  timolol (TIMOPTIC) 0.5 % ophthalmic solution, 1 drop every 3 (three) days.  , Disp: , Rfl: ;  traMADol (ULTRAM) 50 MG tablet, Take 50 mg by mouth every 6 (six) hours as needed.  , Disp: , Rfl:   No Known Allergies     Objective:   Physical Exam  Constitutional: She is oriented to person, place, and time. She appears well-developed and well-nourished.  HENT:  Head: Normocephalic and atraumatic.  Eyes: Conjunctivae and EOM are normal. Pupils are equal, round, and reactive to light.  Neck: Normal range of motion. Neck supple.  Cardiovascular: Normal rate, regular rhythm and normal heart sounds.   Pulmonary/Chest: Effort normal and breath sounds normal.  Abdominal: Soft. Bowel  sounds are normal. There is tenderness.  Musculoskeletal: Normal range of motion.  Neurological: She is alert and oriented to person, place, and time.  Skin: Skin is warm and dry.  Psychiatric: She has a normal mood and affect. Her behavior is normal.       Assessment:     Probable symptomatic gallstones.    Plan:     Because of the risk of further episodes as well as possible pancreatitis I think she would benefit from having her gallbladder removed. She would also like to have this done. I've discussed in detail the risks and benefits of the operations to remove the gallbladder as well as some technical aspects and she understands and wishes to proceed.

## 2011-04-21 ENCOUNTER — Encounter (HOSPITAL_COMMUNITY)
Admission: RE | Admit: 2011-04-21 | Discharge: 2011-04-21 | Disposition: A | Discharge status: Home / Self Care | Payer: PRIVATE HEALTH INSURANCE | Source: Ambulatory Visit | Attending: General Surgery | Admitting: General Surgery

## 2011-04-21 LAB — DIFFERENTIAL
Basophils Absolute: 0 10*3/uL (ref 0.0–0.1)
Basophils Relative: 0 % (ref 0–1)
Eosinophils Absolute: 0.1 10*3/uL (ref 0.0–0.7)
Eosinophils Relative: 1 % (ref 0–5)
Lymphocytes Relative: 38 % (ref 12–46)
Lymphs Abs: 1.7 10*3/uL (ref 0.7–4.0)
Monocytes Absolute: 0.4 10*3/uL (ref 0.1–1.0)
Monocytes Relative: 8 % (ref 3–12)
Neutro Abs: 2.4 10*3/uL (ref 1.7–7.7)
Neutrophils Relative %: 53 % (ref 43–77)

## 2011-04-21 LAB — SURGICAL PCR SCREEN
MRSA, PCR: NEGATIVE
Staphylococcus aureus: NEGATIVE

## 2011-04-21 LAB — COMPREHENSIVE METABOLIC PANEL
ALT: 15 U/L (ref 0–35)
AST: 15 U/L (ref 0–37)
Albumin: 3.7 g/dL (ref 3.5–5.2)
Alkaline Phosphatase: 58 U/L (ref 39–117)
BUN: 19 mg/dL (ref 6–23)
CO2: 28 mEq/L (ref 19–32)
Calcium: 10 mg/dL (ref 8.4–10.5)
Chloride: 103 mEq/L (ref 96–112)
Creatinine, Ser: 1 mg/dL (ref 0.50–1.10)
GFR calc Af Amer: >60 mL/min (ref >60)
GFR calc non Af Amer: 54 mL/min — ABNORMAL LOW (ref >60)
Glucose, Bld: 162 mg/dL — ABNORMAL HIGH (ref 70–99)
Potassium: 3.9 mEq/L (ref 3.5–5.1)
Sodium: 141 mEq/L (ref 135–145)
Total Bilirubin: 0.4 mg/dL (ref 0.3–1.2)
Total Protein: 6.9 g/dL (ref 6.0–8.3)

## 2011-04-21 LAB — CBC
HCT: 35.8 % — ABNORMAL LOW (ref 36.0–46.0)
Hemoglobin: 11.9 g/dL — ABNORMAL LOW (ref 12.0–15.0)
MCH: 28 pg (ref 26.0–34.0)
MCHC: 33.2 g/dL (ref 30.0–36.0)
MCV: 84.2 fL (ref 78.0–100.0)
Platelets: 210 10*3/uL (ref 150–400)
RBC: 4.25 MIL/uL (ref 3.87–5.11)
RDW: 13 % (ref 11.5–15.5)
WBC: 4.6 10*3/uL (ref 4.0–10.5)

## 2011-04-23 ENCOUNTER — Other Ambulatory Visit (INDEPENDENT_AMBULATORY_CARE_PROVIDER_SITE_OTHER): Payer: Self-pay | Admitting: General Surgery

## 2011-04-23 ENCOUNTER — Ambulatory Visit (HOSPITAL_COMMUNITY): Payer: PRIVATE HEALTH INSURANCE

## 2011-04-23 ENCOUNTER — Ambulatory Visit (HOSPITAL_COMMUNITY)
Admission: RE | Admit: 2011-04-23 | Discharge: 2011-04-25 | Disposition: A | Discharge status: Home / Self Care | Payer: PRIVATE HEALTH INSURANCE | Source: Ambulatory Visit | Attending: General Surgery | Admitting: General Surgery

## 2011-04-23 DIAGNOSIS — E119 Type 2 diabetes mellitus without complications: Secondary | ICD-10-CM | POA: Insufficient documentation

## 2011-04-23 DIAGNOSIS — K8010 Calculus of gallbladder with chronic cholecystitis without obstruction: Secondary | ICD-10-CM

## 2011-04-23 DIAGNOSIS — Z01812 Encounter for preprocedural laboratory examination: Secondary | ICD-10-CM | POA: Insufficient documentation

## 2011-04-23 DIAGNOSIS — Z23 Encounter for immunization: Secondary | ICD-10-CM | POA: Insufficient documentation

## 2011-04-23 DIAGNOSIS — Z01810 Encounter for preprocedural cardiovascular examination: Secondary | ICD-10-CM | POA: Insufficient documentation

## 2011-04-23 DIAGNOSIS — E669 Obesity, unspecified: Secondary | ICD-10-CM | POA: Insufficient documentation

## 2011-04-23 DIAGNOSIS — I10 Essential (primary) hypertension: Secondary | ICD-10-CM | POA: Insufficient documentation

## 2011-04-23 DIAGNOSIS — M129 Arthropathy, unspecified: Secondary | ICD-10-CM | POA: Insufficient documentation

## 2011-04-23 HISTORY — PX: LAPAROSCOPIC CHOLECYSTECTOMY: SUR755

## 2011-04-23 LAB — GLUCOSE, CAPILLARY
Glucose-Capillary: 174 mg/dL — ABNORMAL HIGH (ref 70–99)
Glucose-Capillary: 163 mg/dL — ABNORMAL HIGH (ref 70–99)
Glucose-Capillary: 138 mg/dL — ABNORMAL HIGH (ref 70–99)
Glucose-Capillary: 139 mg/dL — ABNORMAL HIGH (ref 70–99)

## 2011-04-24 LAB — GLUCOSE, CAPILLARY
Glucose-Capillary: 153 mg/dL — ABNORMAL HIGH (ref 70–99)
Glucose-Capillary: 159 mg/dL — ABNORMAL HIGH (ref 70–99)
Glucose-Capillary: 172 mg/dL — ABNORMAL HIGH (ref 70–99)
Glucose-Capillary: 174 mg/dL — ABNORMAL HIGH (ref 70–99)

## 2011-04-25 LAB — GLUCOSE, CAPILLARY: Glucose-Capillary: 145 mg/dL — ABNORMAL HIGH (ref 70–99)

## 2011-05-13 ENCOUNTER — Encounter (INDEPENDENT_AMBULATORY_CARE_PROVIDER_SITE_OTHER): Payer: Self-pay | Admitting: General Surgery

## 2011-05-13 ENCOUNTER — Ambulatory Visit (INDEPENDENT_AMBULATORY_CARE_PROVIDER_SITE_OTHER): Payer: PRIVATE HEALTH INSURANCE | Admitting: General Surgery

## 2011-05-13 DIAGNOSIS — K8020 Calculus of gallbladder without cholecystitis without obstruction: Secondary | ICD-10-CM

## 2011-05-13 NOTE — Progress Notes (Signed)
Subjective:     Patient ID: Sarah Weaver, female   DOB: 1935/12/08, 75 y.o.   MRN: [id number]  HPI The patient is a 75 year old white female who is now 2 weeks out from a laparoscopic cholecystectomy for cholecystitis with cholelithiasis. Her postoperative course has been complicated only by some diarrhea. She is not running any fevers. She's not having any abdominal pain.  Review of Systems     Objective:   Physical Exam On exam her abdomen is soft and nontender. Her incisions are healing nicely.    Assessment:     2 weeks status post laparoscopic cholecystectomy.    Plan:     Because of the diarrhea we will plan to check her stool for Clostridium difficile. If this is negative then I think the diarrhea is simply a postop result of the cholecystectomy. Hopefully this will get better for her way from surgery she gets. If it does not we can start her on some cholestyramine. I will see her back in about 3 weeks.

## 2011-05-13 NOTE — Patient Instructions (Signed)
Avoid greasy foods Drink plenty of liquids F/U in 3 weeks

## 2011-05-13 NOTE — Op Note (Signed)
NAMEMARSA, MATTEO          ACCOUNT NO.:  [id number]  MEDICAL RECORD NO.:  [id number]  LOCATION:  5122                         FACILITY:  [facility]  PHYSICIAN:  Ollen Gross. Vernell Morgans, M.D. DATE OF BIRTH:  Jan 06, 1936  DATE OF PROCEDURE:  04/23/2011 DATE OF DISCHARGE:                              OPERATIVE REPORT   PREOPERATIVE DIAGNOSIS:  Gallstones.  POSTOPERATIVE DIAGNOSIS:  Gallstones.  PROCEDURE:  Laparoscopic cholecystectomy with intraoperative cholangiogram.  SURGEON:  Ollen Gross. Vernell Morgans, MD  ASSISTANT:  Wilmon Arms. Tsuei, MD  ANESTHESIA:  General endotracheal.  PROCEDURE IN DETAIL:  After informed consent was obtained, the patient was brought to the operating room and placed in supine position on the operating room table.  After adequate induction of general anesthesia, the patient's abdomen was prepped with ChloraPrep, allowed to dry and draped in usual sterile manner.  The area below the umbilicus was infiltrated with 0.25% Marcaine.  A small incision was made with a #15 blade knife.  This incision was carried down through the subcutaneous tissue bluntly with a hemostat and [employer] retractors until linea alba was identified.  The linea alba was incised with a #15 blade knife and each side was grasped with Kocher clamps and elevated anteriorly.  The preperitoneal space was then probed bluntly with the hemostat until the peritoneum was opened and access was gained to the abdominal cavity.  A 0-Vicryl pursestring stitch was placed in the fascia around the opening. A Hasson cannula was placed through the opening and anchored in place with the previously placed 0-Vicryl pursestring stitch.  The abdomen was then insufflated with carbon dioxide without difficulty.  The patient was placed in reverse Trendelenburg position and rotated with the right side up.  The laparoscope was inserted through the Hasson cannula and the right upper quadrant was inspected.  The dome of the  gallbladder and liver were readily identified.  The epigastric region was then infiltrated with 0.25% Marcaine.  A small incision was made with a #15 blade knife.  A 10-mm port was placed bluntly through this incision into the abdominal cavity under direct vision.  Two sites were chosen laterally on the right side of the abdomen with placement of 5-mm ports. Each of these areas were infiltrated with 0.25% Marcaine.  Small stab incisions were made with #15 blade knife and 5-mm ports were then placed bluntly through these incisions into the abdominal cavity under direct vision.  A blunt grasper was placed through the lateral-most 5-mm port and used to grasp the dome of gallbladder and elevated anteriorly and superiorly.  Another blunt grasper was placed through the other 5-mm port and used to retract on the body and neck of the gallbladder.  A dissector was placed through the epigastric port and using the electrocautery, the peritoneal reflection at the gallbladder neck was opened.  Blunt dissection was then carried out in this area until the gallbladder neck and cystic duct junction was readily identified and a good window was created.  The patient had a small anterior artery that was also dissected bluntly in a circumferential manner until a good window was created.  Two clips were placed proximally and one distally in the artery and the artery  was divided between the two.  A clip was then placed on the gallbladder neck.  A small ductotomy was made just below the clip with the laparoscopic scissors.  A 14-gauge Angiocath was placed percutaneously through the anterior abdominal wall under direct vision.  A Reddick cholangiogram catheter was placed through the Angiocath and flushed.  The Reddick catheter was then placed within the cystic duct and anchored in place with a clip.  A cholangiogram was obtained that showed no filling defects, good emptying in duodenum and adequate length of the  cystic duct.  Anchoring clip and catheters were removed from the patient.  Three clips were placed proximally on the cystic duct and the duct was divided between the two sets of clips. Posterior branch of the cystic artery was identified and again dissected bluntly in a circumferential manner until a good window was created. Two clips were placed proximally and one distally on the artery and the artery was divided between the two.  Next, a laparoscopic hook cautery device was used to separate the gallbladder from the liver bed prior to completely detaching the gallbladder from the liver bed.  The liver bed was inspected and several small bleeding points were coagulated with electrocautery until the area was completely hemostatic.  The gallbladder was then detached rest of the way from the liver bed without difficulty.  A laparoscopic bag was inserted through the epigastric port.  The gallbladder was placed within the bag and bag was sealed. The abdomen was then irrigated with copious amounts of saline until the effluent was clear.  Laparoscope was then moved to the epigastric port. A gallbladder grasper was placed through the Hasson cannula and used to grasp and open the bag.  The bag with the gallbladder was removed with the Hasson cannula through the infraumbilical port without difficulty. The fascial defect was closed with the previously placed 0-Vicryl pursestring stitch as well as with another figure-of-eight 0-Vicryl stitch.  The rest of the ports were then removed under direct vision and were found to be hemostatic.  The gas was allowed to escape.  The skin incisions were all closed with interrupted 4-0 Monocryl subcuticular stitches and Dermabond dressings were applied.  The patient tolerated the procedure well.  At the of the end case, all needle, sponge, and instrument counts were correct.  The patient was then awakened and taken to recovery room in stable  condition.     Ollen Gross. Vernell Morgans, M.D.     PST/MEDQ  D:  04/23/2011  T:  04/24/2011  Job:  [id number]  Electronically Signed by Chevis Pretty III M.D. on 05/13/2011 07:56:05 AM

## 2011-05-20 LAB — CLOSTRIDIUM DIFFICILE TOXIN

## 2011-05-26 ENCOUNTER — Ambulatory Visit (INDEPENDENT_AMBULATORY_CARE_PROVIDER_SITE_OTHER): Payer: PRIVATE HEALTH INSURANCE | Admitting: General Surgery

## 2011-05-26 DIAGNOSIS — K8020 Calculus of gallbladder without cholecystitis without obstruction: Secondary | ICD-10-CM

## 2011-05-26 NOTE — Patient Instructions (Signed)
Continue to try different foods Try to avoid fats

## 2011-05-29 ENCOUNTER — Encounter (INDEPENDENT_AMBULATORY_CARE_PROVIDER_SITE_OTHER): Payer: Self-pay | Admitting: General Surgery

## 2011-05-29 LAB — CLOSTRIDIUM DIFFICILE CULTURE-FECAL

## 2011-05-29 NOTE — Progress Notes (Signed)
Subjective:     Patient ID: Sarah Weaver, female   DOB: May 10, 1936, 75 y.o.   MRN: [id number]  HPI The patient is a 75 year old black female who is now about a month out from a laparoscopic cholecystectomy. Early on she had some issues with diarrhea but this is now resolved. She still doesn't have much of an appetite but otherwise seems to be doing well.  Review of Systems     Objective:   Physical Exam On exam her abdomen is soft and nontender. Her incisions are all healing nicely.   Assessment:     Postop from a laparoscopic cholecystectomy.    Plan:     At this point she seems to be doing well now. Her C. difficile toxin study was negative. The diarrhea has resolved. She can return to all her normal activities without any restrictions. We will plan to see her back on a p.r.n. basis

## 2011-06-12 ENCOUNTER — Encounter (INDEPENDENT_AMBULATORY_CARE_PROVIDER_SITE_OTHER): Payer: PRIVATE HEALTH INSURANCE | Admitting: General Surgery

## 2011-07-07 ENCOUNTER — Encounter (INDEPENDENT_AMBULATORY_CARE_PROVIDER_SITE_OTHER): Payer: Self-pay | Admitting: General Surgery

## 2011-07-07 ENCOUNTER — Ambulatory Visit (INDEPENDENT_AMBULATORY_CARE_PROVIDER_SITE_OTHER): Payer: PRIVATE HEALTH INSURANCE | Admitting: General Surgery

## 2011-07-07 DIAGNOSIS — K8020 Calculus of gallbladder without cholecystitis without obstruction: Secondary | ICD-10-CM

## 2011-07-07 NOTE — Progress Notes (Signed)
Subjective:     Patient ID: Sarah Weaver, female   DOB: [birth year], 75 y.o.   MRN: [id number]  HPI The patient is a 75 her white female who is now little over 2 months out from a laparoscopic cholecystectomy. Her postoperative course was complicated by some persistent diarrhea but this has since resolved now. She feels good today and has no complaints.  Review of Systems     Objective:   Physical Exam On exam her abdomen is soft and nontender. Her incisions have all healed nicely.    Assessment:     2 months status post laparoscopic cholecystectomy    Plan:     At this point she can return to her normal activities without any restrictions. We will plan to see her back on a p.r.n. basis.

## 2011-07-07 NOTE — Patient Instructions (Signed)
May return to all normal activities 

## 2012-06-07 ENCOUNTER — Emergency Department (HOSPITAL_COMMUNITY): Payer: PRIVATE HEALTH INSURANCE

## 2012-06-07 ENCOUNTER — Emergency Department (HOSPITAL_COMMUNITY)
Admission: EM | Admit: 2012-06-07 | Discharge: 2012-06-08 | Disposition: A | Discharge status: Home / Self Care | Payer: PRIVATE HEALTH INSURANCE | Attending: Emergency Medicine | Admitting: Emergency Medicine

## 2012-06-07 ENCOUNTER — Encounter (HOSPITAL_COMMUNITY): Payer: Self-pay

## 2012-06-07 DIAGNOSIS — R51 Headache: Secondary | ICD-10-CM | POA: Insufficient documentation

## 2012-06-07 DIAGNOSIS — I10 Essential (primary) hypertension: Secondary | ICD-10-CM | POA: Insufficient documentation

## 2012-06-07 DIAGNOSIS — M542 Cervicalgia: Secondary | ICD-10-CM | POA: Insufficient documentation

## 2012-06-07 DIAGNOSIS — Z79899 Other long term (current) drug therapy: Secondary | ICD-10-CM | POA: Insufficient documentation

## 2012-06-07 DIAGNOSIS — M5412 Radiculopathy, cervical region: Secondary | ICD-10-CM | POA: Insufficient documentation

## 2012-06-07 DIAGNOSIS — E119 Type 2 diabetes mellitus without complications: Secondary | ICD-10-CM | POA: Insufficient documentation

## 2012-06-07 MED ORDER — MORPHINE SULFATE 4 MG/ML IJ SOLN
4.0000 mg | Freq: Once | INTRAMUSCULAR | Status: AC
  Administered 2012-06-07: 4 mg via INTRAVENOUS
  Filled 2012-06-07: qty 1

## 2012-06-07 MED ORDER — DIAZEPAM 5 MG PO TABS
5.0000 mg | ORAL_TABLET | Freq: Once | ORAL | Status: AC
  Administered 2012-06-07: 5 mg via ORAL
  Filled 2012-06-07: qty 1

## 2012-06-07 MED ORDER — ONDANSETRON HCL 4 MG/2ML IJ SOLN
4.0000 mg | Freq: Once | INTRAMUSCULAR | Status: AC
  Administered 2012-06-07: 4 mg via INTRAVENOUS
  Filled 2012-06-07: qty 2

## 2012-06-07 NOTE — ED Provider Notes (Signed)
History     CSN: [id number]  Arrival date & time 06/07/12  8469   First MD Initiated Contact with Patient 06/07/12 2124      Chief Complaint  Patient presents with  . Headache  . Neck Pain   HPI  History provided by the patient and family. Patient is a 76 year old female with history of hypertension, hyperlipidemia, diabetes, and degenerative disc disease who presents with complaints of posterior neck pain. Patient reports waking up Saturday morning with pain in her neck. Pain is worse with any movements. He has been increasing steadily and is unrelieved with hydrocodone at home. Patient uses hydrocodone for low back pains. She denies any previous history of neck pains similar to this. Pain radiates into the posterior scalp and for head area. She denies any numbness or weakness of extremities, facial weakness or droop, speech difficulty or vision change. Patient denies any fever, chills, cough, nasal congestion, rhinorrhea or sinus pressure.    Past Medical History  Diagnosis Date  . Hyperlipidemia   . Hypertension   . Glaucoma   . Arthritis   . Diabetes mellitus     Past Surgical History  Procedure Date  . Tonsillectomy   . Cholecystectomy 04/23/11    lap chole    Family History  Problem Relation Age of Onset  . Heart disease Mother   . Heart disease Sister   . Cancer Sister     History  Substance Use Topics  . Smoking status: Never Smoker   . Smokeless tobacco: Not on file  . Alcohol Use: No    OB History    Grav Para Term Preterm Abortions TAB SAB Ect Mult Living                  Review of Systems  Constitutional: Negative for fever, chills and diaphoresis.  HENT: Positive for neck pain. Negative for congestion, sore throat, rhinorrhea and sinus pressure.   Eyes: Negative for visual disturbance.  Respiratory: Negative for cough.   Musculoskeletal: Negative for back pain.  Neurological: Positive for headaches. Negative for dizziness, seizures, facial  asymmetry, speech difficulty, weakness, light-headedness and numbness.    Allergies  Review of patient's allergies indicates no known allergies.  Home Medications   Current Outpatient Rx  Name Route Sig Dispense Refill  . ATENOLOL 50 MG PO TABS Oral Take 50 mg by mouth 2 (two) times daily.      Marland Kitchen BRINZOLAMIDE 1 % OP SUSP  1 drop every 3 (three) days.      Marland Kitchen CLONIDINE HCL 0.1 MG PO TABS Oral Take 0.1 mg by mouth daily.     Marland Kitchen GLIMEPIRIDE 4 MG PO TABS Oral Take 4 mg by mouth daily before breakfast.      . HYDROCHLOROTHIAZIDE 25 MG PO TABS Oral Take 25 mg by mouth daily.      Marland Kitchen HYDROCODONE-ACETAMINOPHEN 5-500 MG PO TABS Oral Take 1 tablet by mouth 2 (two) times daily as needed.     . IRBESARTAN 300 MG PO TABS Oral Take 300 mg by mouth daily.     Marland Kitchen LATANOPROST 0.005 % OP SOLN Both Eyes Place 1 drop into both eyes as needed. Every 12 days     . METFORMIN HCL ER (MOD) 1000 MG PO TB24 Oral Take 1,000 mg by mouth 2 (two) times daily with a meal.      . NIFEDIPINE ER 90 MG PO TB24 Oral Take 90 mg by mouth daily.      Marland Kitchen  POTASSIUM CHLORIDE 10 MEQ PO TBCR Oral Take 10 mEq by mouth daily.      Marland Kitchen SIMVASTATIN 40 MG PO TABS Oral Take 40 mg by mouth at bedtime.        BP 152/78  Pulse 77  Temp 98.7 F (37.1 C) (Oral)  Resp 16  SpO2 96%  Physical Exam  Nursing note and vitals reviewed. Constitutional: She is oriented to person, place, and time. She appears well-developed and well-nourished. No distress.  HENT:  Head: Normocephalic and atraumatic.       Tenderness to posterior scalp and neck.  Eyes: EOM are normal. Pupils are equal, round, and reactive to light.  Neck: No tracheal deviation present.  Cardiovascular: Normal rate and regular rhythm.   Pulmonary/Chest: Effort normal and breath sounds normal. No respiratory distress. She has no rales.  Abdominal: Soft. There is no tenderness.  Musculoskeletal:       Cervical back: She exhibits decreased range of motion and tenderness.        Back:       Bilateral posterior soft tissue tenderness. No gross deformities. Reduced range of motion secondary to pain.  Lymphadenopathy:    She has no cervical adenopathy.  Neurological: She is alert and oriented to person, place, and time. She has normal strength. No cranial nerve deficit or sensory deficit.       Strength equal bilaterally in upper extremities  Skin: Skin is warm and dry. No rash noted.  Psychiatric: She has a normal mood and affect. Her behavior is normal.    ED Course  Procedures   Ct Cervical Spine Wo Contrast  06/07/2012  *RADIOLOGY REPORT*  Clinical Data: 76 year old female with neck pain, headache.  CT CERVICAL SPINE WITHOUT CONTRAST  Technique:  Multidetector CT imaging of the cervical spine was performed. Multiplanar CT image reconstructions were also generated.  Comparison: Head CT 03/21/2011.  Findings: Straightening of cervical lordosis.  Mild ligamentous hypertrophy about the odontoid. Visualized skull base is intact. No atlanto-occipital dissociation.  Cervicothoracic junction alignment is within normal limits.  Bilateral posterior element alignment is within normal limits.  No acute cervical fracture.  Negative lung apices. Visualized paraspinal soft tissues are within normal limits.  Incidental retropharyngeal course of the carotid arteries.  Negative visualized posterior fossa structures.  Multilevel central cervical disc protrusions, most pronounced at C3- C4, and combined with some ligamentous hypertrophy at that level to result in spinal stenosis which is probably mild (estimated AP thecal sac 7-8 mm).  IMPRESSION: 1. No acute fracture or listhesis identified in the cervical spine. Ligamentous injury is not excluded. 2.  Cervical disc degeneration. Evidence of mild spinal stenosis at C3-C4 related to disc protrusion and ligament flavum hypertrophy.   Original Report Authenticated By: Harley Hallmark, M.D.      1. Neck pain   2. Cervical radiculopathy        MDM  9:25 PM patient seen and evaluated. Patient appears mildly uncomfortable. Patient having difficulty positioning head.  Pain symptoms began in the posterior neck. Pain is reproducible with palpation. Pain does cause radiation up into the skull. Patient has a normal nonfocal neuro exam. Symptoms have been gradually worsening. At this time I have low suspicion for [facility] or other concerning cause. CT scan of cervical spine shows some stenosis. Will provide patient soft collar for comfort and symptomatic treatment this time. Patient given strict injections to followup for recheck of symptoms with PCP.      Angus Seller, PA 06/08/12  2215 

## 2012-06-07 NOTE — ED Notes (Signed)
Symptoms started Saturday morning with headache on back of her head and neck pain. Pain 10/10. Denied weakness, numbness, pt has clear speech, no facial droop noted, neurologically intact.

## 2012-06-08 MED ORDER — OXYCODONE-ACETAMINOPHEN 5-325 MG PO TABS: 1.0000 | ORAL_TABLET | ORAL | Status: DC | PRN

## 2012-06-08 MED ORDER — METHOCARBAMOL 500 MG PO TABS: 500.0000 mg | ORAL_TABLET | Freq: Two times a day (BID) | ORAL | Status: DC

## 2012-06-09 NOTE — ED Provider Notes (Signed)
History/physical exam/procedure(s) were performed by non-physician practitioner and as supervising physician I was immediately available for consultation/collaboration. I have reviewed all notes and am in agreement with care and plan.  I performed a history and physical examination of Sarah Weaver and discussed her management with Noel Gerold agree with the history, physical, assessment, and plan of care, with the following exceptions: None  I was present for the following procedures: None Time Spent in Critical Care of the patient: None Time spent in discussions with the patient and family: 10  Masaru Chamberlin Corlis Leak, MD 06/09/12 1512

## 2013-03-15 IMAGING — CT CT HEAD W/O CM
1 of 2 series · 13 of 30 positions shown, 17 images · non-contrast
Comparison: None.

CLINICAL DATA: Vomiting.  Dizziness.  Diabetes.

CT HEAD WITHOUT CONTRAST
TECHNIQUE: Contiguous axial images were obtained from the base of
the skull through the vertex without contrast.

[Series 2: brain · axial · 0.49mm/px · z∈[+127,+263]mm · 13 of 32 slices shown, 17 images]
[im 3/32  brain]
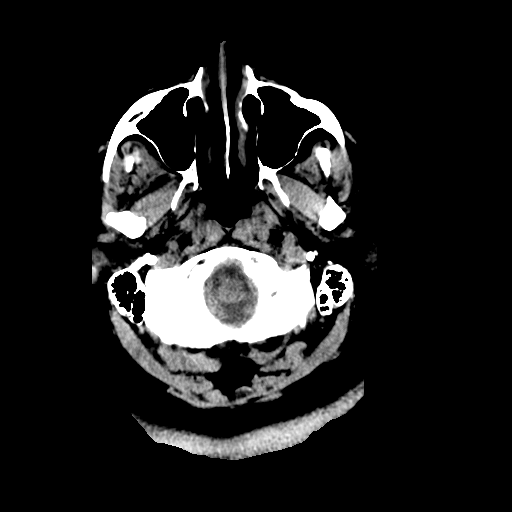
[im 3/32  bone]
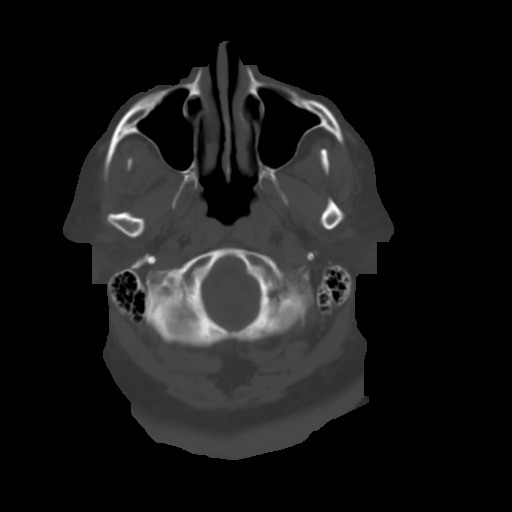
[im 5/32  brain]
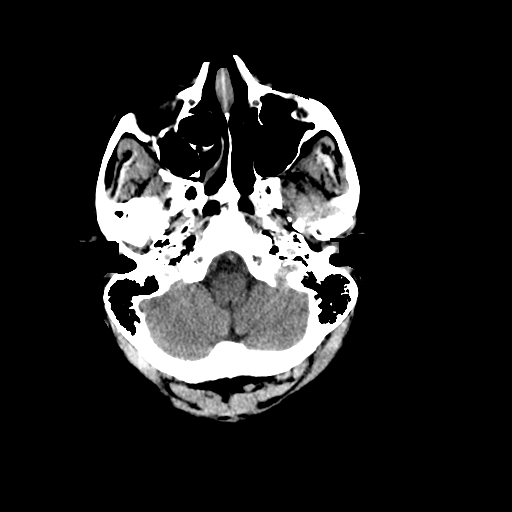
[im 7/32  brain]
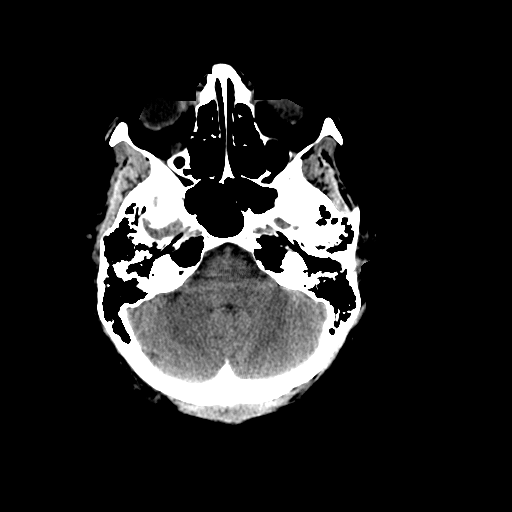
[im 9/32  brain]
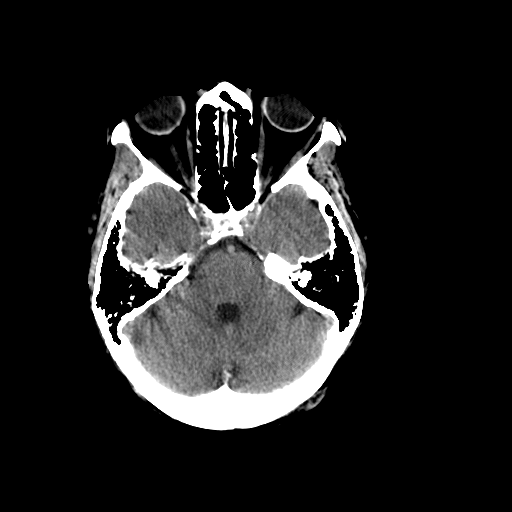
[im 12/32  brain]
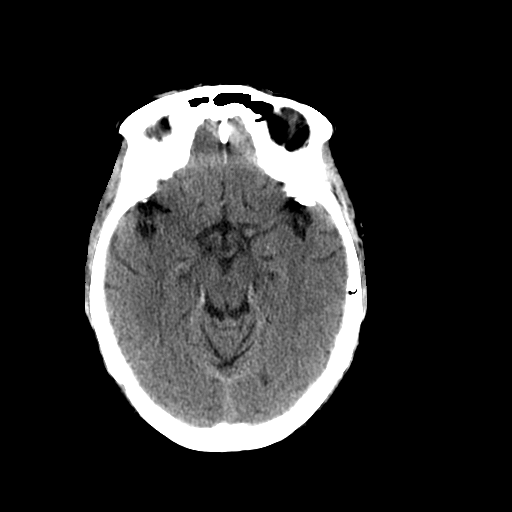
[im 12/32  bone]
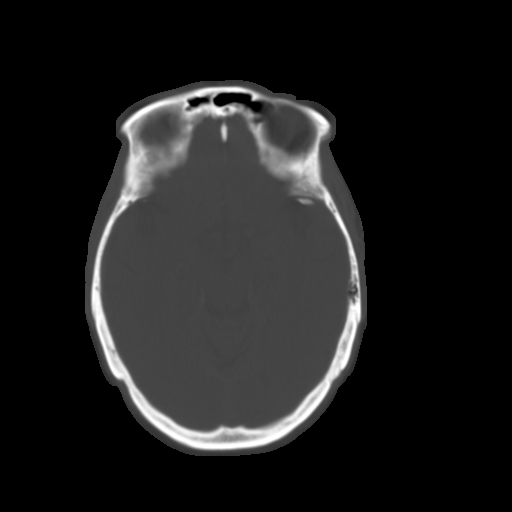
[im 14/32  brain]
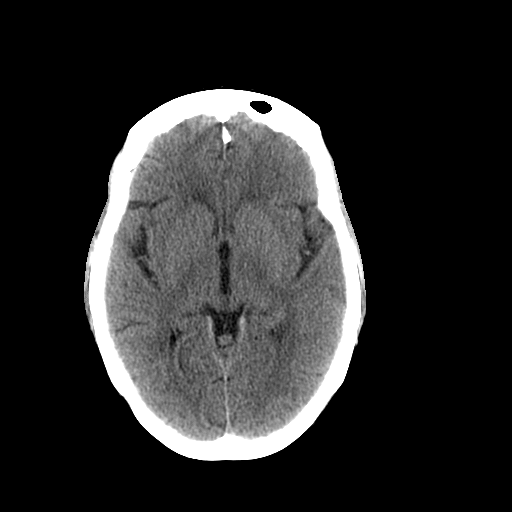
[im 16/32  brain]
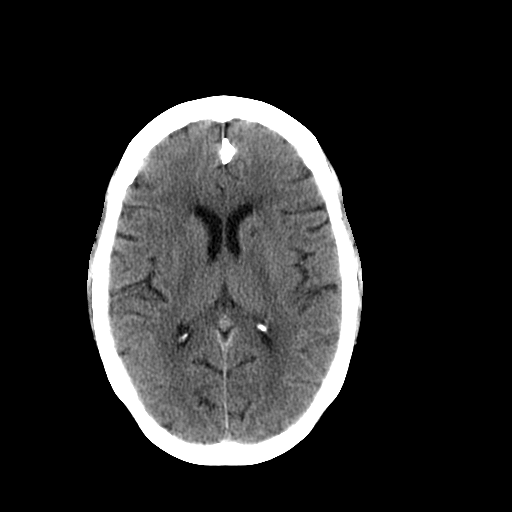
[im 18/32  brain]
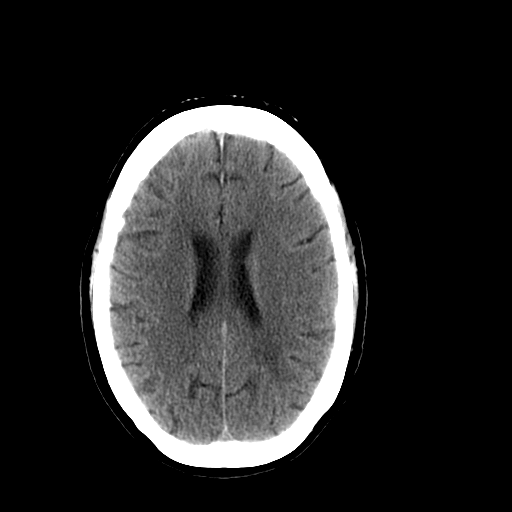
[im 20/32  brain]
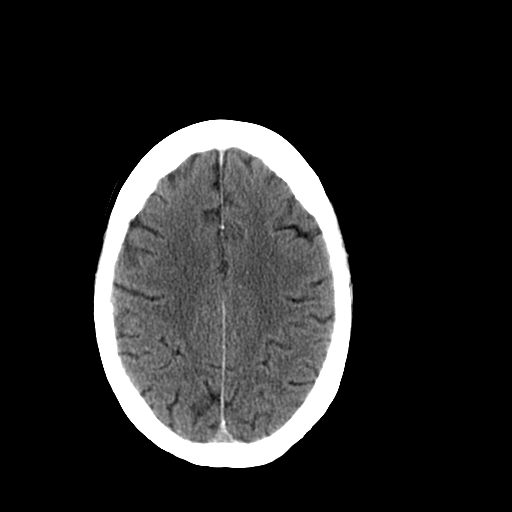
[im 20/32  bone]
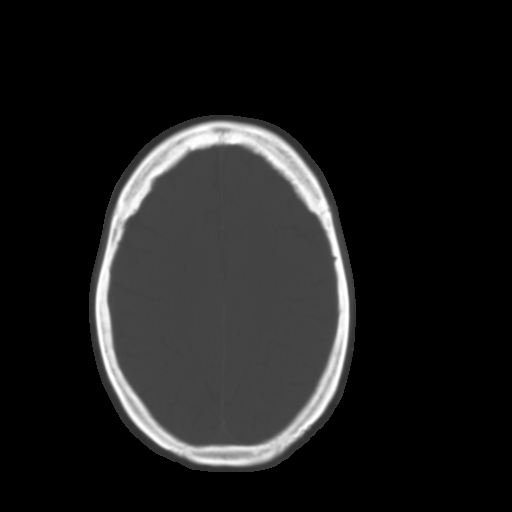
[im 23/32  brain]
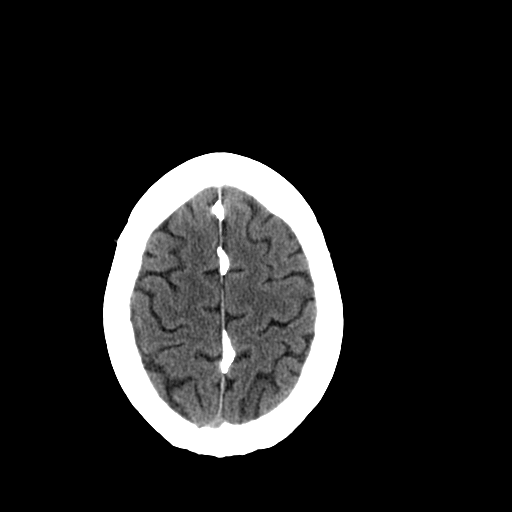
[im 25/32  brain]
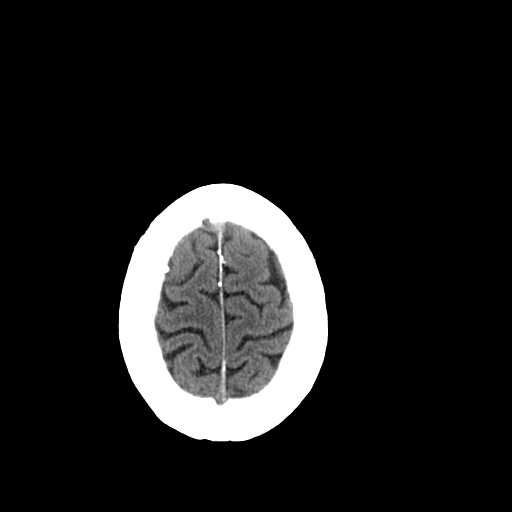
[im 27/32  brain]
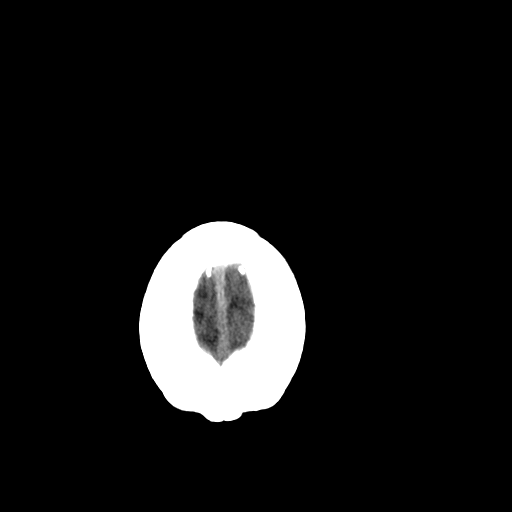
[im 29/32  brain]
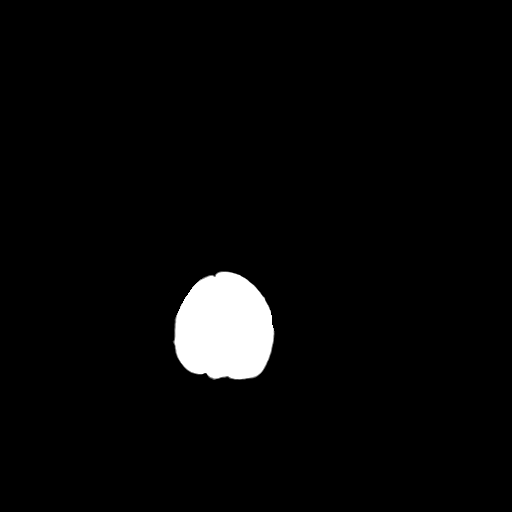
[im 29/32  bone]
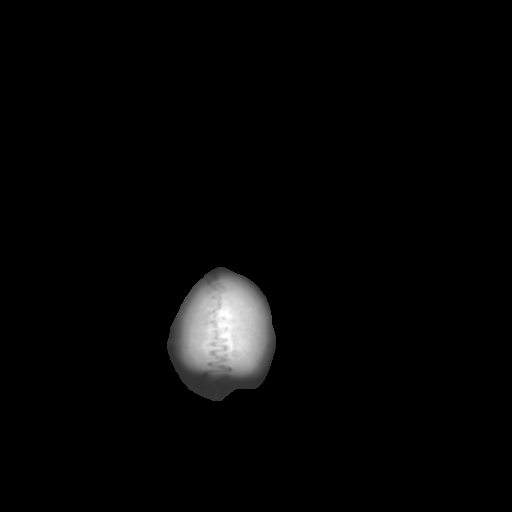

[13 of 30 positions shown; findings below may reference images not displayed]

FINDINGS: Ventricles and sulci appear symmetrical without
significant mass effect or midline shift.  No ventricular
dilatation.  Gray-white matter junctions are distinct.  Basal
cisterns are not effaced.  No abnormal extra-axial fluid
collections.  No evidence of acute intracranial hemorrhage.
Scattered calcifications along the falx.  No depressed skull
fractures.  Visualized paranasal sinuses are not opacified.
IMPRESSION: No evidence of acute intracranial hemorrhage, mass lesion, or acute
infarct.

## 2013-04-05 ENCOUNTER — Ambulatory Visit (HOSPITAL_COMMUNITY)
Admission: RE | Admit: 2013-04-05 | Discharge: 2013-04-05 | Disposition: A | Discharge status: Home / Self Care | Payer: PRIVATE HEALTH INSURANCE | Source: Ambulatory Visit | Attending: Internal Medicine | Admitting: Internal Medicine

## 2013-04-05 ENCOUNTER — Other Ambulatory Visit (HOSPITAL_COMMUNITY): Payer: Self-pay | Admitting: Internal Medicine

## 2013-04-05 DIAGNOSIS — M25521 Pain in right elbow: Secondary | ICD-10-CM

## 2013-04-05 DIAGNOSIS — G8929 Other chronic pain: Secondary | ICD-10-CM

## 2013-04-05 DIAGNOSIS — M25531 Pain in right wrist: Secondary | ICD-10-CM

## 2013-04-05 DIAGNOSIS — M25529 Pain in unspecified elbow: Secondary | ICD-10-CM | POA: Insufficient documentation

## 2013-04-05 DIAGNOSIS — M25539 Pain in unspecified wrist: Secondary | ICD-10-CM | POA: Insufficient documentation

## 2015-02-14 ENCOUNTER — Other Ambulatory Visit: Payer: Self-pay | Admitting: Family Medicine

## 2015-02-14 DIAGNOSIS — Z1231 Encounter for screening mammogram for malignant neoplasm of breast: Secondary | ICD-10-CM

## 2015-02-14 DIAGNOSIS — M8580 Other specified disorders of bone density and structure, unspecified site: Secondary | ICD-10-CM

## 2015-02-14 DIAGNOSIS — E2839 Other primary ovarian failure: Secondary | ICD-10-CM

## 2015-06-20 ENCOUNTER — Emergency Department (HOSPITAL_COMMUNITY): Payer: Medicare Other

## 2015-06-20 ENCOUNTER — Encounter (HOSPITAL_COMMUNITY): Payer: Self-pay | Admitting: Neurology

## 2015-06-20 ENCOUNTER — Emergency Department (HOSPITAL_COMMUNITY)
Admission: EM | Admit: 2015-06-20 | Discharge: 2015-06-20 | Disposition: A | Discharge status: Home / Self Care | Payer: Medicare Other | Attending: Emergency Medicine | Admitting: Emergency Medicine

## 2015-06-20 DIAGNOSIS — H409 Unspecified glaucoma: Secondary | ICD-10-CM | POA: Insufficient documentation

## 2015-06-20 DIAGNOSIS — Z79899 Other long term (current) drug therapy: Secondary | ICD-10-CM | POA: Insufficient documentation

## 2015-06-20 DIAGNOSIS — I10 Essential (primary) hypertension: Secondary | ICD-10-CM | POA: Insufficient documentation

## 2015-06-20 DIAGNOSIS — M7918 Myalgia, other site: Secondary | ICD-10-CM

## 2015-06-20 DIAGNOSIS — M1990 Unspecified osteoarthritis, unspecified site: Secondary | ICD-10-CM | POA: Diagnosis not present

## 2015-06-20 DIAGNOSIS — E119 Type 2 diabetes mellitus without complications: Secondary | ICD-10-CM | POA: Insufficient documentation

## 2015-06-20 DIAGNOSIS — E785 Hyperlipidemia, unspecified: Secondary | ICD-10-CM | POA: Insufficient documentation

## 2015-06-20 DIAGNOSIS — M542 Cervicalgia: Secondary | ICD-10-CM | POA: Insufficient documentation

## 2015-06-20 DIAGNOSIS — M25511 Pain in right shoulder: Secondary | ICD-10-CM | POA: Insufficient documentation

## 2015-06-20 LAB — CBC WITH DIFFERENTIAL/PLATELET
Basophils Absolute: 0 10*3/uL (ref 0.0–0.1)
Basophils Relative: 0 %
Eosinophils Absolute: 0.1 10*3/uL (ref 0.0–0.7)
Eosinophils Relative: 1 %
HCT: 39.8 % (ref 36.0–46.0)
Hemoglobin: 12.8 g/dL (ref 12.0–15.0)
Lymphocytes Relative: 25 %
Lymphs Abs: 1.6 10*3/uL (ref 0.7–4.0)
MCH: 28.3 pg (ref 26.0–34.0)
MCHC: 32.2 g/dL (ref 30.0–36.0)
MCV: 87.9 fL (ref 78.0–100.0)
Monocytes Absolute: 0.4 10*3/uL (ref 0.1–1.0)
Monocytes Relative: 7 %
Neutro Abs: 4.3 10*3/uL (ref 1.7–7.7)
Neutrophils Relative %: 67 %
Platelets: 190 10*3/uL (ref 150–400)
RBC: 4.53 MIL/uL (ref 3.87–5.11)
RDW: 13.3 % (ref 11.5–15.5)
WBC: 6.4 10*3/uL (ref 4.0–10.5)

## 2015-06-20 LAB — BASIC METABOLIC PANEL
Anion gap: 16 — ABNORMAL HIGH (ref 5–15)
BUN: 22 mg/dL — ABNORMAL HIGH (ref 6–20)
CO2: 19 mmol/L — ABNORMAL LOW (ref 22–32)
Calcium: 9.4 mg/dL (ref 8.9–10.3)
Chloride: 99 mmol/L — ABNORMAL LOW (ref 101–111)
Creatinine, Ser: 1.28 mg/dL — ABNORMAL HIGH (ref 0.44–1.00)
GFR calc Af Amer: 46 mL/min — ABNORMAL LOW (ref >60)
GFR calc non Af Amer: 39 mL/min — ABNORMAL LOW (ref >60)
Glucose, Bld: 129 mg/dL — ABNORMAL HIGH (ref 65–99)
Potassium: 4.3 mmol/L (ref 3.5–5.1)
Sodium: 134 mmol/L — ABNORMAL LOW (ref 135–145)

## 2015-06-20 LAB — I-STAT TROPONIN, ED: Troponin i, poc: 0.01 ng/mL (ref 0.00–0.08)

## 2015-06-20 MED ORDER — CYCLOBENZAPRINE HCL 10 MG PO TABS
5.0000 mg | ORAL_TABLET | Freq: Once | ORAL | Status: AC
  Administered 2015-06-20: 5 mg via ORAL
  Filled 2015-06-20: qty 1

## 2015-06-20 MED ORDER — HYDROCODONE-ACETAMINOPHEN 5-325 MG PO TABS: 1.0000 | ORAL_TABLET | ORAL | Status: DC | PRN

## 2015-06-20 MED ORDER — CYCLOBENZAPRINE HCL 7.5 MG PO TABS: 7.5000 mg | ORAL_TABLET | Freq: Two times a day (BID) | ORAL | Status: DC | PRN

## 2015-06-20 MED ORDER — ASPIRIN 81 MG PO CHEW
324.0000 mg | CHEWABLE_TABLET | Freq: Once | ORAL | Status: AC
  Administered 2015-06-20: 324 mg via ORAL
  Filled 2015-06-20: qty 4

## 2015-06-20 NOTE — Discharge Instructions (Signed)
Musculoskeletal Pain Musculoskeletal pain is muscle and boney aches and pains. These pains can occur in any part of the body. Your caregiver may treat you without knowing the cause of the pain. They may treat you if blood or urine tests, X-rays, and other tests were normal.  CAUSES There is often not a definite cause or reason for these pains. These pains may be caused by a type of germ (virus). The discomfort may also come from overuse. Overuse includes working out too hard when your body is not fit. Boney aches also come from weather changes. Bone is sensitive to atmospheric pressure changes. HOME CARE INSTRUCTIONS   Ask when your test results will be ready. Make sure you get your test results.  Only take over-the-counter or prescription medicines for pain, discomfort, or fever as directed by your caregiver. If you were given medications for your condition, do not drive, operate machinery or power tools, or sign legal documents for 24 hours. Do not drink alcohol. Do not take sleeping pills or other medications that may interfere with treatment.  Continue all activities unless the activities cause more pain. When the pain lessens, slowly resume normal activities. Gradually increase the intensity and duration of the activities or exercise.  During periods of severe pain, bed rest may be helpful. Lay or sit in any position that is comfortable.  Putting ice on the injured area.  Put ice in a bag.  Place a towel between your skin and the bag.  Leave the ice on for 15 to 20 minutes, 3 to 4 times a day.  Follow up with your caregiver for continued problems and no reason can be found for the pain. If the pain becomes worse or does not go away, it may be necessary to repeat tests or do additional testing. Your caregiver may need to look further for a possible cause. SEEK IMMEDIATE MEDICAL CARE IF:  You have pain that is getting worse and is not relieved by medications.  You develop chest pain  that is associated with shortness or breath, sweating, feeling sick to your stomach (nauseous), or throw up (vomit).  Your pain becomes localized to the abdomen.  You develop any new symptoms that seem different or that concern you. MAKE SURE YOU:   Understand these instructions.  Will watch your condition.  Will get help right away if you are not doing well or get worse.   This information is not intended to replace advice given to you by your health care provider. Make sure you discuss any questions you have with your health care provider.   Document Released: 09/01/2005 Document Revised: 11/24/2011 Document Reviewed: 05/06/2013 Elsevier Interactive Patient Education 2016 [employer]   Muscle Cramps and Spasms Muscle cramps and spasms occur when a muscle or muscles tighten and you have no control over this tightening (involuntary muscle contraction). They are a common problem and can develop in any muscle. The most common place is in the calf muscles of the leg. Both muscle cramps and muscle spasms are involuntary muscle contractions, but they also have differences:   Muscle cramps are sporadic and painful. They may last a few seconds to a quarter of an hour. Muscle cramps are often more forceful and last longer than muscle spasms.  Muscle spasms may or may not be painful. They may also last just a few seconds or much longer. CAUSES  It is uncommon for cramps or spasms to be due to a serious underlying problem. In many cases,  the cause of cramps or spasms is unknown. Some common causes are:   Overexertion.   Overuse from repetitive motions (doing the same thing over and over).   Remaining in a certain position for a long period of time.   Improper preparation, form, or technique while performing a sport or activity.   Dehydration.   Injury.   Side effects of some medicines.   Abnormally low levels of the salts and ions in your blood (electrolytes), especially  potassium and calcium. This could happen if you are taking water pills (diuretics) or you are pregnant.  Some underlying medical problems can make it more likely to develop cramps or spasms. These include, but are not limited to:   Diabetes.   Parkinson disease.   Hormone disorders, such as thyroid problems.   Alcohol abuse.   Diseases specific to muscles, joints, and bones.   Blood vessel disease where not enough blood is getting to the muscles.  HOME CARE INSTRUCTIONS   Stay well hydrated. Drink enough water and fluids to keep your urine clear or pale yellow.  It may be helpful to massage, stretch, and relax the affected muscle.  For tight or tense muscles, use a warm towel, heating pad, or hot shower water directed to the affected area.  If you are sore or have pain after a cramp or spasm, applying ice to the affected area may relieve discomfort.  Put ice in a plastic bag.  Place a towel between your skin and the bag.  Leave the ice on for 15-20 minutes, 03-04 times a day.  Medicines used to treat a known cause of cramps or spasms may help reduce their frequency or severity. Only take over-the-counter or prescription medicines as directed by your caregiver. SEEK MEDICAL CARE IF:  Your cramps or spasms get more severe, more frequent, or do not improve over time.  MAKE SURE YOU:   Understand these instructions.  Will watch your condition.  Will get help right away if you are not doing well or get worse.   This information is not intended to replace advice given to you by your health care provider. Make sure you discuss any questions you have with your health care provider.   Document Released: 02/21/2002 Document Revised: 12/27/2012 Document Reviewed: 08/18/2012 Elsevier Interactive Patient Education [employer]

## 2015-06-20 NOTE — Care Management (Signed)
ED CM received call from [facility] Pharmacist regarding flexeril prescription written on discharge today upon discharge from ED. Flexeril is not covered not covered by patient's insurance. Spoke with Dr. Julieanne Manson can be switched to Zanaflex instead. Pharmacist made aware. No further CM needs identified.

## 2015-06-20 NOTE — ED Provider Notes (Signed)
CSN: [id number]     Arrival date & time 06/20/15  1655 History   First MD Initiated Contact with Patient 06/20/15 1723     Chief Complaint  Patient presents with  . Shoulder Pain  . Neck Pain   Jonia Oakey is a 79 y.o. female with a past medical history significant for hypertension, hyperlipidemia, diabetes, and arthritis who presents with right shoulder and neck pain. The patient reports that she has had pain for approximately 5 days that she feels radiates from her shoulder to her neck. The patient describes the pain as aching, sharp, and tense. The patient reports that she is ambidextrous and noticed the pain while she was cooking for her grandson on Saturday. The patient denies any specific trauma, any chest pain, shortness of breath, nausea, vomiting, constipation, diarrhea, dysuria. The patient denies any recent head injuries, headache, vision changes, numbness, tingling, or weakness in any extremity. The patient has no other complaints other than the shoulder aching.   (Consider location/radiation/quality/duration/timing/severity/associated sxs/prior Treatment) Patient is a 79 y.o. female presenting with shoulder pain. The history is provided by the patient and a relative. No language interpreter was used.  Shoulder Pain Location:  Shoulder Time since incident:  5 days Injury: no   Shoulder location:  R shoulder Pain details:    Quality:  Aching, cramping and sharp   Radiates to:  R shoulder (right neck)   Severity:  Severe   Onset quality:  Gradual   Timing:  Constant   Progression:  Unchanged Chronicity:  New Handedness:  Ambidextrous Dislocation: no   Foreign body present:  No foreign bodies Tetanus status:  Unknown Prior injury to area:  No Relieved by:  Nothing Worsened by:  Movement Ineffective treatments:  NSAIDs Associated symptoms: neck pain and stiffness   Associated symptoms: no back pain, no decreased range of motion, no fatigue, no fever, no muscle  weakness, no numbness, no swelling and no tingling   Risk factors: no recent illness     Past Medical History  Diagnosis Date  . Hyperlipidemia   . Hypertension   . Glaucoma   . Arthritis   . Diabetes mellitus    Past Surgical History  Procedure Laterality Date  . Tonsillectomy    . Cholecystectomy  04/23/11    lap chole   Family History  Problem Relation Age of Onset  . Heart disease Mother   . Heart disease Sister   . Cancer Sister    Social History  Substance Use Topics  . Smoking status: Never Smoker   . Smokeless tobacco: None  . Alcohol Use: No   OB History    No data available     Review of Systems  Constitutional: Negative for fever, chills, diaphoresis and fatigue.  HENT: Negative for congestion and rhinorrhea.   Eyes: Negative for visual disturbance.  Respiratory: Negative for cough, chest tightness, shortness of breath, wheezing and stridor.   Cardiovascular: Negative for chest pain and palpitations.  Gastrointestinal: Negative for nausea, vomiting, abdominal pain, diarrhea and constipation.  Genitourinary: Negative for flank pain.  Musculoskeletal: Positive for stiffness and neck pain. Negative for back pain and neck stiffness.  Skin: Negative for pallor and rash.  Neurological: Negative for dizziness, facial asymmetry, weakness, light-headedness, numbness and headaches.  Psychiatric/Behavioral: Negative for confusion and agitation.  All other systems reviewed and are negative.     Allergies  Review of patient's allergies indicates no known allergies.  Home Medications   Prior to Admission medications  Medication Sig Start Date End Date Taking? Authorizing Provider  atenolol (TENORMIN) 50 MG tablet Take 50 mg by mouth 2 (two) times daily.      Historical Provider, MD  B Complex-C (B-COMPLEX WITH VITAMIN C) tablet Take 1 tablet by mouth daily.    Historical Provider, MD  brinzolamide (AZOPT) 1 % ophthalmic suspension Place 1 drop into both eyes 3  (three) times daily.     Historical Provider, MD  cloNIDine (CATAPRES) 0.1 MG tablet Take 0.05 mg by mouth 2 (two) times daily.     Historical Provider, MD  glimepiride (AMARYL) 4 MG tablet Take 4 mg by mouth daily before breakfast.      Historical Provider, MD  hydrochlorothiazide 25 MG tablet Take 25 mg by mouth daily.      Historical Provider, MD  HYDROcodone-acetaminophen (VICODIN) 5-500 MG per tablet Take 1 tablet by mouth 2 (two) times daily as needed. For pain.    Historical Provider, MD  irbesartan (AVAPRO) 300 MG tablet Take 300 mg by mouth daily.     Historical Provider, MD  latanoprost (XALATAN) 0.005 % ophthalmic solution Place 1 drop into both eyes at bedtime.     Historical Provider, MD  metFORMIN (GLUCOPHAGE) 1000 MG tablet Take 1,000 mg by mouth 2 (two) times daily with a meal.    Historical Provider, MD  methocarbamol (ROBAXIN) 500 MG tablet Take 1 tablet (500 mg total) by mouth 2 (two) times daily. 06/08/12   Ivonne Andrew, PA-C  NIFEdipine (ADALAT CC) 90 MG 24 hr tablet Take 90 mg by mouth daily.      Historical Provider, MD  oxyCODONE-acetaminophen (PERCOCET) 5-325 MG per tablet Take 1 tablet by mouth every 4 (four) hours as needed for pain. 06/08/12   Ivonne Andrew, PA-C  potassium chloride (KLOR-CON) 10 MEQ CR tablet Take 10 mEq by mouth daily.      Historical Provider, MD  simvastatin (ZOCOR) 40 MG tablet Take 40 mg by mouth at bedtime.      Historical Provider, MD   BP 141/63 mmHg  Pulse 77  Temp(Src) 98.3 F (36.8 C) (Oral)  Resp 16  SpO2 93% Physical Exam  Constitutional: She is oriented to person, place, and time. She appears well-developed and well-nourished. No distress.  HENT:  Head: Normocephalic and atraumatic.  Mouth/Throat: No oropharyngeal exudate.  Eyes: Conjunctivae and EOM are normal. Pupils are equal, round, and reactive to light.  Neck: Normal range of motion. Muscular tenderness present. No spinous process tenderness present. No erythema and normal range  of motion present.    Cardiovascular: Normal rate, regular rhythm, normal heart sounds and intact distal pulses.   No murmur heard. Pulmonary/Chest: Effort normal and breath sounds normal. No stridor. No respiratory distress. She has no wheezes. She exhibits no tenderness.  Abdominal: Soft. Bowel sounds are normal. She exhibits no distension and no mass. There is no tenderness. There is no rebound.  Musculoskeletal: She exhibits tenderness.       Right shoulder: She exhibits tenderness and pain. She exhibits normal range of motion, no crepitus, no deformity, no laceration, normal pulse and normal strength.       Arms: Patient's right upper extremity exam unremarkable. Patient had normal pulses, normal strength and sensation and range of motion in all joints of the right of her sternum area. The patient mild tenderness to palpation in the superior shoulder of the right arm towards her neck.  Neurological: She is alert and oriented to person, place, and time. She displays  normal reflexes. No cranial nerve deficit. She exhibits normal muscle tone. Coordination normal.  Skin: Skin is warm. She is not diaphoretic. No erythema.  Psychiatric: She has a normal mood and affect.  Vitals reviewed.   ED Course  Procedures (including critical care time) Labs Review Labs Reviewed  BASIC METABOLIC PANEL - Abnormal; Notable for the following:    Sodium 134 (*)    Chloride 99 (*)    CO2 19 (*)    Glucose, Bld 129 (*)    BUN 22 (*)    Creatinine, Ser 1.28 (*)    GFR calc non Af Amer 39 (*)    GFR calc Af Amer 46 (*)    Anion gap 16 (*)    All other components within normal limits  CBC WITH DIFFERENTIAL/PLATELET  I-STAT TROPOININ, ED    Imaging Review Dg Chest 2 View  06/20/2015   CLINICAL DATA:  Right shoulder pain for 4 days  EXAM: CHEST  2 VIEW  COMPARISON:  03/21/2011  FINDINGS: Heart is mildly enlarged allowing for AP technique. Lungs are under aerated and grossly clear. Increased a body  habitus. No pleural effusion or pneumothorax.  IMPRESSION: No active cardiopulmonary disease.   Electronically Signed   By: Jolaine Click M.D.   On: 06/20/2015 18:58   Dg Shoulder Right  06/20/2015   CLINICAL DATA:  Pain for 4 days.  No known injury  EXAM: RIGHT SHOULDER - 2+ VIEW  COMPARISON:  April 04, 2005  FINDINGS: Frontal, Y scapular, and axillary images obtained. There is no fracture or dislocation. Joint spaces appear intact. No erosive change. No intra-articular calcification.  IMPRESSION: No fracture or dislocation.  No appreciable arthropathy.   Electronically Signed   By: Bretta Bang III M.D.   On: 06/20/2015 18:58   I have personally reviewed and evaluated these images and lab results as part of my medical decision-making.   EKG Interpretation   Date/Time:  Wednesday June 20 2015 17:09:34 EDT Ventricular Rate:  72 PR Interval:  176 QRS Duration: 68 QT Interval:  368 QTC Calculation: 402 R Axis:   9 Text Interpretation:  Normal sinus rhythm Cannot rule out Anterior infarct  , age undetermined ST \\T \ T wave abnormality, consider inferior ischemia  Abnormal ECG Confirmed by Juleen China  MD, STEPHEN ([id number]) on 06/20/2015 5:15:17  PM      MDM   Anola Gurney is a 79 y.o. female with a past medical history significant for hypertension, hyperlipidemia, diabetes, and arthritis who presents with right shoulder and neck pain. The patient denied any chest pain, shortness of breath, or other symptoms concerning for cardiac etiology of her pain. The patient's exam was concerning for a musculoskeletal etiology of her symptoms as there was palpable muscle spasm in the right shoulder and neck. These muscle areas were also tender to palpation.  The patient had some screening laboratory and imaging studies. The patient's CBC did not show evidence of acute infection or anemia and the patient's BMP was remarkable for a slightly worsening kidney function. The patient's troponin was  normal. And had imaging studies of the chest and shoulder which did not show evidence of acute fracture or findings consistent with infection.  Given the patient's exam and history, the patient's pain is felt to be secondary to a musculoskeletal etiology. The patient was given Flexeril and he reported significant improvement in her symptoms while in the ED.  As the patient reports that she was taking ibuprofen at home, given the  patient's worsening kidney function, the patient was advised to stop taking this medication. The patient was given prescriptions for Norco as well as Flexeril for pain and spasm management. The patient was instructed to follow-up with both her PCP for surveillance of the kidney function as well as a cardiologist to follow-up for some EKG changes were observed.   The patient and her family had no further questions, concerns, or complaints and understood return precautions for new or worsening symptoms. The patient was discharged in good condition with improvement in her presenting symptoms.  This patient was seen with Dr. Madilyn Hook, emergency medicine attending.   Final diagnoses:  Musculoskeletal pain       Theda Belfast, MD 06/21/15 1610  Tilden Fossa, MD 06/23/15 1340

## 2015-06-20 NOTE — ED Notes (Signed)
Pt reports right sided pain in her elbow to her shoulder and into the back of her neck since Saturday. Denies injury or fall. Denies cp or sob.

## 2015-12-20 ENCOUNTER — Encounter (HOSPITAL_COMMUNITY): Payer: Self-pay | Admitting: Emergency Medicine

## 2015-12-20 ENCOUNTER — Encounter (HOSPITAL_COMMUNITY)
Admission: EM | Disposition: A | Discharge status: Home / Self Care | Payer: Self-pay | Source: Home / Self Care | Attending: Internal Medicine

## 2015-12-20 ENCOUNTER — Emergency Department (HOSPITAL_COMMUNITY): Payer: Medicare Other

## 2015-12-20 ENCOUNTER — Inpatient Hospital Stay (HOSPITAL_COMMUNITY)
Admission: EM | Admit: 2015-12-20 | Discharge: 2015-12-21 | DRG: 247 | Disposition: A | Discharge status: Home / Self Care | Payer: Medicare Other | Attending: Internal Medicine | Admitting: Internal Medicine

## 2015-12-20 DIAGNOSIS — I214 Non-ST elevation (NSTEMI) myocardial infarction: Principal | ICD-10-CM | POA: Diagnosis present

## 2015-12-20 DIAGNOSIS — R079 Chest pain, unspecified: Secondary | ICD-10-CM | POA: Diagnosis present

## 2015-12-20 DIAGNOSIS — N183 Chronic kidney disease, stage 3 (moderate): Secondary | ICD-10-CM | POA: Diagnosis present

## 2015-12-20 DIAGNOSIS — I209 Angina pectoris, unspecified: Secondary | ICD-10-CM | POA: Diagnosis not present

## 2015-12-20 DIAGNOSIS — I252 Old myocardial infarction: Secondary | ICD-10-CM | POA: Insufficient documentation

## 2015-12-20 DIAGNOSIS — I129 Hypertensive chronic kidney disease with stage 1 through stage 4 chronic kidney disease, or unspecified chronic kidney disease: Secondary | ICD-10-CM | POA: Diagnosis present

## 2015-12-20 DIAGNOSIS — I10 Essential (primary) hypertension: Secondary | ICD-10-CM | POA: Diagnosis present

## 2015-12-20 DIAGNOSIS — N179 Acute kidney failure, unspecified: Secondary | ICD-10-CM

## 2015-12-20 DIAGNOSIS — E1165 Type 2 diabetes mellitus with hyperglycemia: Secondary | ICD-10-CM | POA: Diagnosis present

## 2015-12-20 DIAGNOSIS — Z955 Presence of coronary angioplasty implant and graft: Secondary | ICD-10-CM

## 2015-12-20 DIAGNOSIS — Z7984 Long term (current) use of oral hypoglycemic drugs: Secondary | ICD-10-CM | POA: Diagnosis not present

## 2015-12-20 DIAGNOSIS — E1122 Type 2 diabetes mellitus with diabetic chronic kidney disease: Secondary | ICD-10-CM

## 2015-12-20 DIAGNOSIS — E785 Hyperlipidemia, unspecified: Secondary | ICD-10-CM | POA: Diagnosis present

## 2015-12-20 DIAGNOSIS — I25119 Atherosclerotic heart disease of native coronary artery with unspecified angina pectoris: Secondary | ICD-10-CM | POA: Diagnosis not present

## 2015-12-20 DIAGNOSIS — Z79899 Other long term (current) drug therapy: Secondary | ICD-10-CM

## 2015-12-20 DIAGNOSIS — E119 Type 2 diabetes mellitus without complications: Secondary | ICD-10-CM

## 2015-12-20 DIAGNOSIS — I119 Hypertensive heart disease without heart failure: Secondary | ICD-10-CM | POA: Insufficient documentation

## 2015-12-20 DIAGNOSIS — Z6841 Body Mass Index (BMI) 40.0 and over, adult: Secondary | ICD-10-CM

## 2015-12-20 DIAGNOSIS — I2510 Atherosclerotic heart disease of native coronary artery without angina pectoris: Secondary | ICD-10-CM | POA: Diagnosis not present

## 2015-12-20 DIAGNOSIS — H409 Unspecified glaucoma: Secondary | ICD-10-CM | POA: Diagnosis present

## 2015-12-20 DIAGNOSIS — Z7982 Long term (current) use of aspirin: Secondary | ICD-10-CM | POA: Diagnosis not present

## 2015-12-20 DIAGNOSIS — I110 Hypertensive heart disease with heart failure: Secondary | ICD-10-CM | POA: Diagnosis present

## 2015-12-20 HISTORY — DX: Low back pain, unspecified: M54.50

## 2015-12-20 HISTORY — DX: Atherosclerotic heart disease of native coronary artery without angina pectoris: I25.10

## 2015-12-20 HISTORY — DX: Type 2 diabetes mellitus without complications: E11.9

## 2015-12-20 HISTORY — DX: Low back pain: M54.5

## 2015-12-20 HISTORY — PX: CARDIAC CATHETERIZATION: SHX172

## 2015-12-20 HISTORY — DX: Other chronic pain: G89.29

## 2015-12-20 HISTORY — PX: CORONARY ANGIOPLASTY WITH STENT PLACEMENT: SHX49

## 2015-12-20 HISTORY — DX: Unspecified glaucoma: H40.9

## 2015-12-20 LAB — GLUCOSE, CAPILLARY
Glucose-Capillary: 110 mg/dL — ABNORMAL HIGH (ref 65–99)
Glucose-Capillary: 180 mg/dL — ABNORMAL HIGH (ref 65–99)
Glucose-Capillary: 232 mg/dL — ABNORMAL HIGH (ref 65–99)

## 2015-12-20 LAB — CBC
HCT: 36.5 % (ref 36.0–46.0)
Hemoglobin: 11.3 g/dL — ABNORMAL LOW (ref 12.0–15.0)
MCH: 27.4 pg (ref 26.0–34.0)
MCHC: 31 g/dL (ref 30.0–36.0)
MCV: 88.4 fL (ref 78.0–100.0)
Platelets: 199 10*3/uL (ref 150–400)
RBC: 4.13 MIL/uL (ref 3.87–5.11)
RDW: 13.2 % (ref 11.5–15.5)
WBC: 5.2 10*3/uL (ref 4.0–10.5)

## 2015-12-20 LAB — POCT ACTIVATED CLOTTING TIME: Activated Clotting Time: 507 seconds

## 2015-12-20 LAB — I-STAT TROPONIN, ED: Troponin i, poc: 0 ng/mL (ref 0.00–0.08)

## 2015-12-20 LAB — CBG MONITORING, ED
Glucose-Capillary: 306 mg/dL — ABNORMAL HIGH (ref 65–99)
Glucose-Capillary: 219 mg/dL — ABNORMAL HIGH (ref 65–99)

## 2015-12-20 LAB — TROPONIN I
Troponin I: 0.05 ng/mL — ABNORMAL HIGH (ref <0.031)
Troponin I: 0.15 ng/mL — ABNORMAL HIGH (ref <0.031)
Troponin I: 0.41 ng/mL — ABNORMAL HIGH (ref <0.031)

## 2015-12-20 LAB — BASIC METABOLIC PANEL
Anion gap: 14 (ref 5–15)
BUN: 17 mg/dL (ref 6–20)
CO2: 27 mmol/L (ref 22–32)
Calcium: 9.7 mg/dL (ref 8.9–10.3)
Chloride: 98 mmol/L — ABNORMAL LOW (ref 101–111)
Creatinine, Ser: 1.48 mg/dL — ABNORMAL HIGH (ref 0.44–1.00)
GFR calc Af Amer: 38 mL/min — ABNORMAL LOW (ref >60)
GFR calc non Af Amer: 33 mL/min — ABNORMAL LOW (ref >60)
Glucose, Bld: 275 mg/dL — ABNORMAL HIGH (ref 65–99)
Potassium: 4 mmol/L (ref 3.5–5.1)
Sodium: 139 mmol/L (ref 135–145)

## 2015-12-20 LAB — MAGNESIUM: Magnesium: 1.7 mg/dL (ref 1.7–2.4)

## 2015-12-20 LAB — BRAIN NATRIURETIC PEPTIDE: B Natriuretic Peptide: 47.7 pg/mL (ref 0.0–100.0)

## 2015-12-20 SURGERY — LEFT HEART CATH AND CORONARY ANGIOGRAPHY

## 2015-12-20 MED ORDER — LIDOCAINE HCL (PF) 1 % IJ SOLN
INTRAMUSCULAR | Status: AC
  Filled 2015-12-20: qty 30

## 2015-12-20 MED ORDER — NITROGLYCERIN 1 MG/10 ML FOR IR/CATH LAB
INTRA_ARTERIAL | Status: AC
  Filled 2015-12-20: qty 10

## 2015-12-20 MED ORDER — IOPAMIDOL (ISOVUE-370) INJECTION 76%
INTRAVENOUS | Status: AC
  Filled 2015-12-20: qty 100

## 2015-12-20 MED ORDER — LOSARTAN POTASSIUM 50 MG PO TABS
100.0000 mg | ORAL_TABLET | Freq: Every day | ORAL | Status: DC
  Administered 2015-12-20: 100 mg via ORAL
  Filled 2015-12-20 (×2): qty 2

## 2015-12-20 MED ORDER — BIVALIRUDIN 250 MG IV SOLR
INTRAVENOUS | Status: AC
  Filled 2015-12-20: qty 250

## 2015-12-20 MED ORDER — NITROGLYCERIN IN D5W 200-5 MCG/ML-% IV SOLN
10.0000 ug/min | INTRAVENOUS | Status: DC
  Administered 2015-12-20: 19:00:00 10 ug/min via INTRAVENOUS

## 2015-12-20 MED ORDER — MORPHINE SULFATE (PF) 2 MG/ML IV SOLN: 2.0000 mg | INTRAVENOUS | Status: DC | PRN

## 2015-12-20 MED ORDER — METOPROLOL TARTRATE 1 MG/ML IV SOLN
INTRAVENOUS | Status: DC | PRN
  Administered 2015-12-20: 5 mg via INTRAVENOUS

## 2015-12-20 MED ORDER — VITAMIN D 1000 UNITS PO TABS
1000.0000 [IU] | ORAL_TABLET | Freq: Every day | ORAL | Status: DC
  Administered 2015-12-20 – 2015-12-21 (×2): 1000 [IU] via ORAL
  Filled 2015-12-20 (×2): qty 1

## 2015-12-20 MED ORDER — VERAPAMIL HCL 2.5 MG/ML IV SOLN
INTRA_ARTERIAL | Status: DC | PRN
  Administered 2015-12-20: 2 mL via INTRA_ARTERIAL
  Administered 2015-12-20: 5 mL via INTRA_ARTERIAL

## 2015-12-20 MED ORDER — MIDAZOLAM HCL 2 MG/2ML IJ SOLN
INTRAMUSCULAR | Status: AC
  Filled 2015-12-20: qty 2

## 2015-12-20 MED ORDER — SODIUM CHLORIDE 0.9% FLUSH: 3.0000 mL | INTRAVENOUS | Status: DC | PRN

## 2015-12-20 MED ORDER — METOPROLOL TARTRATE 1 MG/ML IV SOLN
INTRAVENOUS | Status: AC
  Filled 2015-12-20: qty 5

## 2015-12-20 MED ORDER — BIVALIRUDIN 250 MG IV SOLR
1.0000 mg/kg/h | INTRAVENOUS | Status: AC
  Administered 2015-12-20: 1 mg/kg/h via INTRAVENOUS
  Filled 2015-12-20: qty 250

## 2015-12-20 MED ORDER — NITROGLYCERIN 1 MG/10 ML FOR IR/CATH LAB
INTRA_ARTERIAL | Status: DC | PRN
  Administered 2015-12-20: 200 ug via INTRA_ARTERIAL

## 2015-12-20 MED ORDER — ACETAMINOPHEN 325 MG PO TABS: 650.0000 mg | ORAL_TABLET | ORAL | Status: DC | PRN

## 2015-12-20 MED ORDER — LATANOPROST 0.005 % OP SOLN
1.0000 [drp] | Freq: Every day | OPHTHALMIC | Status: DC
  Administered 2015-12-20: 1 [drp] via OPHTHALMIC
  Filled 2015-12-20: qty 2.5

## 2015-12-20 MED ORDER — SODIUM CHLORIDE 0.9 % IV SOLN: INTRAVENOUS | Status: AC

## 2015-12-20 MED ORDER — ATENOLOL 50 MG PO TABS
50.0000 mg | ORAL_TABLET | Freq: Two times a day (BID) | ORAL | Status: DC
  Administered 2015-12-20: 50 mg via ORAL
  Filled 2015-12-20: qty 1

## 2015-12-20 MED ORDER — NITROGLYCERIN 0.4 MG SL SUBL
0.4000 mg | SUBLINGUAL_TABLET | SUBLINGUAL | Status: AC | PRN
  Administered 2015-12-20 (×3): 0.4 mg via SUBLINGUAL
  Filled 2015-12-20: qty 1

## 2015-12-20 MED ORDER — LOSARTAN POTASSIUM-HCTZ 100-25 MG PO TABS: 1.0000 | ORAL_TABLET | Freq: Every day | ORAL | Status: DC

## 2015-12-20 MED ORDER — SODIUM CHLORIDE 0.9 % IV SOLN
250.0000 mg | INTRAVENOUS | Status: DC | PRN
  Administered 2015-12-20: 1.75 mg/kg/h via INTRAVENOUS
  Administered 2015-12-20 (×2): 250 mg
  Administered 2015-12-20: 1.75 mg/kg/h via INTRAVENOUS

## 2015-12-20 MED ORDER — VERAPAMIL HCL 2.5 MG/ML IV SOLN
INTRAVENOUS | Status: AC
  Filled 2015-12-20: qty 2

## 2015-12-20 MED ORDER — NITROGLYCERIN IN D5W 200-5 MCG/ML-% IV SOLN
INTRAVENOUS | Status: DC | PRN
  Administered 2015-12-20: 10 ug/min via INTRAVENOUS

## 2015-12-20 MED ORDER — TICAGRELOR 90 MG PO TABS
ORAL_TABLET | ORAL | Status: AC
  Filled 2015-12-20: qty 1

## 2015-12-20 MED ORDER — SODIUM CHLORIDE 0.9 % IV SOLN
Freq: Once | INTRAVENOUS | Status: AC
  Administered 2015-12-20: 150 mL/h via INTRAVENOUS

## 2015-12-20 MED ORDER — HEPARIN (PORCINE) IN NACL 100-0.45 UNIT/ML-% IJ SOLN
1400.0000 [IU]/h | INTRAMUSCULAR | Status: DC
  Administered 2015-12-20: 1400 [IU]/h via INTRAVENOUS
  Filled 2015-12-20 (×2): qty 250

## 2015-12-20 MED ORDER — ONDANSETRON HCL 4 MG/2ML IJ SOLN: 4.0000 mg | Freq: Four times a day (QID) | INTRAMUSCULAR | Status: DC | PRN

## 2015-12-20 MED ORDER — SODIUM CHLORIDE 0.9% FLUSH
3.0000 mL | Freq: Two times a day (BID) | INTRAVENOUS | Status: DC
  Administered 2015-12-20 – 2015-12-21 (×2): 3 mL via INTRAVENOUS

## 2015-12-20 MED ORDER — SODIUM CHLORIDE 0.9 % IV SOLN: 250.0000 mL | INTRAVENOUS | Status: DC | PRN

## 2015-12-20 MED ORDER — INSULIN ASPART 100 UNIT/ML ~~LOC~~ SOLN
0.0000 [IU] | Freq: Three times a day (TID) | SUBCUTANEOUS | Status: DC
  Administered 2015-12-21: 07:00:00 2 [IU] via SUBCUTANEOUS
  Administered 2015-12-21: 1 [IU] via SUBCUTANEOUS

## 2015-12-20 MED ORDER — HEPARIN BOLUS VIA INFUSION
4000.0000 [IU] | Freq: Once | INTRAVENOUS | Status: AC
  Administered 2015-12-20: 4000 [IU] via INTRAVENOUS
  Filled 2015-12-20: qty 4000

## 2015-12-20 MED ORDER — SIMVASTATIN 20 MG PO TABS: 40.0000 mg | ORAL_TABLET | Freq: Every day | ORAL | Status: DC

## 2015-12-20 MED ORDER — HEPARIN SODIUM (PORCINE) 1000 UNIT/ML IJ SOLN
INTRAMUSCULAR | Status: DC | PRN
Start: 2015-12-20 — End: 2015-12-20
  Administered 2015-12-20: 6000 [IU] via INTRAVENOUS

## 2015-12-20 MED ORDER — INSULIN ASPART 100 UNIT/ML ~~LOC~~ SOLN
0.0000 [IU] | SUBCUTANEOUS | Status: DC
  Administered 2015-12-20: 2 [IU] via SUBCUTANEOUS
  Administered 2015-12-20: 7 [IU] via SUBCUTANEOUS
  Administered 2015-12-20: 3 [IU] via SUBCUTANEOUS
  Filled 2015-12-20 (×2): qty 1

## 2015-12-20 MED ORDER — BIVALIRUDIN BOLUS VIA INFUSION - CUPID
INTRAVENOUS | Status: DC | PRN
  Administered 2015-12-20: 95.25 mg via INTRAVENOUS

## 2015-12-20 MED ORDER — TICAGRELOR 90 MG PO TABS
90.0000 mg | ORAL_TABLET | Freq: Two times a day (BID) | ORAL | Status: DC
Start: 2015-12-20 — End: 2015-12-21
  Administered 2015-12-20 – 2015-12-21 (×2): 90 mg via ORAL
  Filled 2015-12-20 (×2): qty 1

## 2015-12-20 MED ORDER — ONDANSETRON HCL 4 MG/2ML IJ SOLN
4.0000 mg | Freq: Four times a day (QID) | INTRAMUSCULAR | Status: DC | PRN
Start: 2015-12-20 — End: 2015-12-21

## 2015-12-20 MED ORDER — MIDAZOLAM HCL 2 MG/2ML IJ SOLN
INTRAMUSCULAR | Status: DC | PRN
  Administered 2015-12-20 (×2): 1 mg via INTRAVENOUS

## 2015-12-20 MED ORDER — ASPIRIN EC 81 MG PO TBEC
81.0000 mg | DELAYED_RELEASE_TABLET | Freq: Every day | ORAL | Status: DC
  Administered 2015-12-20 – 2015-12-21 (×2): 81 mg via ORAL
  Filled 2015-12-20 (×2): qty 1

## 2015-12-20 MED ORDER — HYDROCHLOROTHIAZIDE 25 MG PO TABS
25.0000 mg | ORAL_TABLET | Freq: Every day | ORAL | Status: DC
  Administered 2015-12-20: 18:00:00 25 mg via ORAL
  Filled 2015-12-20: qty 1

## 2015-12-20 MED ORDER — LIDOCAINE HCL (PF) 1 % IJ SOLN
INTRAMUSCULAR | Status: DC | PRN
  Administered 2015-12-20: 13:00:00

## 2015-12-20 MED ORDER — TICAGRELOR 90 MG PO TABS
ORAL_TABLET | ORAL | Status: DC | PRN
  Administered 2015-12-20: 180 mg via ORAL

## 2015-12-20 MED ORDER — ENOXAPARIN SODIUM 40 MG/0.4ML ~~LOC~~ SOLN
40.0000 mg | Freq: Every day | SUBCUTANEOUS | Status: DC
  Filled 2015-12-20: qty 0.4

## 2015-12-20 MED ORDER — ANGIOPLASTY BOOK
Freq: Once | Status: AC
  Administered 2015-12-21: 07:00:00
  Filled 2015-12-20: qty 1

## 2015-12-20 MED ORDER — HEPARIN (PORCINE) IN NACL 2-0.9 UNIT/ML-% IJ SOLN
INTRAMUSCULAR | Status: AC
  Filled 2015-12-20: qty 1000

## 2015-12-20 MED ORDER — NIFEDIPINE ER OSMOTIC RELEASE 90 MG PO TB24
90.0000 mg | ORAL_TABLET | Freq: Every day | ORAL | Status: DC
  Filled 2015-12-20 (×2): qty 1

## 2015-12-20 MED ORDER — ASPIRIN 81 MG PO CHEW
81.0000 mg | CHEWABLE_TABLET | Freq: Every day | ORAL | Status: DC
Start: 2015-12-20 — End: 2015-12-20

## 2015-12-20 MED ORDER — ATORVASTATIN CALCIUM 80 MG PO TABS
80.0000 mg | ORAL_TABLET | Freq: Every day | ORAL | Status: DC
  Administered 2015-12-20: 18:00:00 80 mg via ORAL
  Filled 2015-12-20: qty 1

## 2015-12-20 MED ORDER — IOPAMIDOL (ISOVUE-370) INJECTION 76%
INTRAVENOUS | Status: DC | PRN
  Administered 2015-12-20: 360 mL via INTRA_ARTERIAL

## 2015-12-20 MED ORDER — FENTANYL CITRATE (PF) 100 MCG/2ML IJ SOLN
INTRAMUSCULAR | Status: DC | PRN
  Administered 2015-12-20 (×3): 25 ug via INTRAVENOUS

## 2015-12-20 MED ORDER — FENTANYL CITRATE (PF) 100 MCG/2ML IJ SOLN
INTRAMUSCULAR | Status: AC
  Filled 2015-12-20: qty 2

## 2015-12-20 MED ORDER — BRINZOLAMIDE 1 % OP SUSP
1.0000 [drp] | Freq: Three times a day (TID) | OPHTHALMIC | Status: DC
  Administered 2015-12-20 – 2015-12-21 (×3): 1 [drp] via OPHTHALMIC
  Filled 2015-12-20: qty 10

## 2015-12-20 SURGICAL SUPPLY — 22 items
BALLN EMERGE MR 2.0X12 (BALLOONS) ×2
BALLN ~~LOC~~ EMERGE MR 2.75X15 (BALLOONS) ×2
BALLOON EMERGE MR 2.0X12 (BALLOONS) IMPLANT
BALLOON ~~LOC~~ EMERGE MR 2.75X15 (BALLOONS) IMPLANT
CATH INFINITI 5 FR JL3.5 (CATHETERS) ×1 IMPLANT
CATH INFINITI JR4 5F (CATHETERS) ×1 IMPLANT
CATH VISTA GUIDE 6FR XBLAD3.5 (CATHETERS) ×1 IMPLANT
DEVICE RAD COMP TR BAND LRG (VASCULAR PRODUCTS) ×1 IMPLANT
GLIDESHEATH SLEND A-KIT 6F 22G (SHEATH) ×1 IMPLANT
HOVERMATT SINGLE USE (MISCELLANEOUS) ×1 IMPLANT
KIT ENCORE 26 ADVANTAGE (KITS) ×1 IMPLANT
KIT ESSENTIALS PG (KITS) ×1 IMPLANT
KIT HEART LEFT (KITS) ×2 IMPLANT
PACK CARDIAC CATHETERIZATION (CUSTOM PROCEDURE TRAY) ×2 IMPLANT
STENT SYNERGY DES 2.5X20 (Permanent Stent) ×1 IMPLANT
TRANSDUCER W/STOPCOCK (MISCELLANEOUS) ×2 IMPLANT
TUBING CIL FLEX 10 FLL-RA (TUBING) ×2 IMPLANT
WIRE ASAHI PROWATER 180CM (WIRE) ×1 IMPLANT
WIRE HI TORQ VERSACORE-J 145CM (WIRE) ×1 IMPLANT
WIRE HI TORQ WHISPER MS 190CM (WIRE) ×1 IMPLANT
WIRE PT2 MS 300CM (WIRE) ×1 IMPLANT
WIRE SAFE-T 1.5MM-J .035X260CM (WIRE) ×1 IMPLANT

## 2015-12-20 NOTE — Care Management Note (Signed)
Case Management Note  Patient Details  Name: Sarah GurneyFreddie Mae Tench MRN: [id number] Date of Birth: Mar 03, 1936  Subjective/Objective:     Patient is from home alone, pta indep.  NCM gave her [employer] 30 day savings card and per benefit check-[employer]: $3.70 for 30 day retail/ no auth required/ Patient can use any retail pharmacy .  [facility] [address] To pick up 30 day free.  NCM will cont to follow for dc needs.                 Action/Plan:   Expected Discharge Date:                  Expected Discharge Plan:  Home/Self Care  In-House Referral:     Discharge planning Services  CM Consult  Post Acute Care Choice:    Choice offered to:     DME Arranged:    DME Agency:     HH Arranged:    HH Agency:     Status of Service:  In process, will continue to follow  Medicare Important Message Given:    Date Medicare IM Given:    Medicare IM give by:    Date Additional Medicare IM Given:    Additional Medicare Important Message give by:     If discussed at Long Length of Stay Meetings, dates discussed:    Additional Comments:  Leone Havenaylor, Keviana Guida Clinton, RN 12/20/2015, 4:47 PM

## 2015-12-20 NOTE — ED Notes (Signed)
CBG - 219 

## 2015-12-20 NOTE — Progress Notes (Signed)
Family in to see. 

## 2015-12-20 NOTE — ED Notes (Signed)
Per EMS, pt c/o chest pain that began approx 1hr prior to calling EMS. Reported pt had similar CP earlier in the week, but not as severe. Took 324mg  ASA per dispatch direction, and received 1 nitro from EMS. Pain decr'd with nitro from 10/10 to 8/10 en route. Denies SOB, N/V/D, LOC. PMHx includes HTN, DM. NKDA

## 2015-12-20 NOTE — Progress Notes (Signed)
PATIENT DETAILS Name: Sarah Weaver Age: 80 y.o. Sex: female Date of Birth: 1936/09/08 Admit Date: 12/20/2015 Admitting Physician Runell Gess, MD ZOX:WRUE, CAMMIE, MD  Subjective: No further chest pain  Assessment/Plan: Principal Problem: Acute Coronary Syndrome:see by Cards, underwent LHC with PCI to mid LAD. Continue ASA,Brilinta, statin and beta blocker.     Active Problems: DM2 (diabetes mellitus, type 2):CBG's stable-continue SSI. Oral Hypoglycemics on hold-Await A1C.  HTN (hypertension):controlled-continue Atenolol-follow  HLD (hyperlipidemia):continue statin-await fasting lipids.  Disposition: Remain inpatient  Antimicrobial agents  See below  Anti-infectives    None      DVT Prophylaxis:  SCD's  Code Status: Full code   Family Communication None at bedside  Procedures: 4/7>>LHC  CONSULTS:  cardiology  Time spent 25 minutes-Greater than 50% of this time was spent in counseling, explanation of diagnosis, planning of further management, and coordination of care.  MEDICATIONS: Scheduled Meds: . [MAR Hold] aspirin EC  81 mg Oral Daily  . [MAR Hold] atenolol  50 mg Oral BID  . [MAR Hold] brinzolamide  1 drop Both Eyes TID  . [MAR Hold] cholecalciferol  1,000 Units Oral Daily  . [MAR Hold] losartan  100 mg Oral Daily   And  . [MAR Hold] hydrochlorothiazide  25 mg Oral Daily  . [MAR Hold] insulin aspart  0-9 Units Subcutaneous 6 times per day  . [MAR Hold] latanoprost  1 drop Both Eyes QHS  . [MAR Hold] NIFEdipine  90 mg Oral Daily  . [MAR Hold] simvastatin  40 mg Oral QHS   Continuous Infusions: . sodium chloride 75 mL/hr at 12/20/15 1308  . bivalirudin (ANGIOMAX) infusion 5 mg/mL (Cath Lab,ACS,PCI indication)    . heparin 1,400 Units/hr (12/20/15 0920)   PRN Meds:.[MAR Hold] acetaminophen, morphine injection, [MAR Hold]  morphine injection, [MAR Hold] ondansetron (ZOFRAN) IV    PHYSICAL EXAM: Vital signs in  last 24 hours: Filed Vitals:   12/20/15 1355 12/20/15 1420 12/20/15 1435 12/20/15 1450  BP: 106/47 102/37 104/38 114/31  Pulse: 70 64 66 64  Temp:      TempSrc:      Resp: Height:      SpO2: 96% 96% 95% 95%    Weight change:  There were no vitals filed for this visit. There is no weight on file to calculate BMI.   Gen Exam: Awake and alert with clear speech.  Neck: Supple, No JVD.   Chest: B/L Clear.   CVS: S1 S2 Regular, no murmurs.  Abdomen: soft, BS +, non tender, non distended.  Extremities: + edema, lower extremities warm to touch. Neurologic: Non Focal.   Skin: No Rash.  Wounds: N/A.    Intake/Output from previous day: No intake or output data in the 24 hours ending 12/20/15 1528   LAB RESULTS: CBC  Recent Labs Lab 12/20/15 0155  WBC 5.2  HGB 11.3*  HCT 36.5  PLT 199  MCV 88.4  MCH 27.4  MCHC 31.0  RDW 13.2    Chemistries   Recent Labs Lab 12/20/15 0155  NA 139  K 4.0  CL 98*  CO2 27  GLUCOSE 275*  BUN 17  CREATININE 1.48*  CALCIUM 9.7  MG 1.7    CBG:  Recent Labs Lab 12/20/15 0525 12/20/15 0826 12/20/15 1325  GLUCAP 306* 219* 110*    GFR CrCl cannot be calculated (Unknown ideal weight.).  Coagulation profile No  results for input(s): INR, PROTIME in the last 168 hours.  Cardiac Enzymes  Recent Labs Lab 12/20/15 0504 12/20/15 0753  TROPONINI 0.05* 0.15*    Invalid input(s): POCBNP No results for input(s): DDIMER in the last 72 hours. No results for input(s): HGBA1C in the last 72 hours. No results for input(s): CHOL, HDL, LDLCALC, TRIG, CHOLHDL, LDLDIRECT in the last 72 hours. No results for input(s): TSH, T4TOTAL, T3FREE, THYROIDAB in the last 72 hours.  Invalid input(s): FREET3 No results for input(s): VITAMINB12, FOLATE, FERRITIN, TIBC, IRON, RETICCTPCT in the last 72 hours. No results for input(s): LIPASE, AMYLASE in the last 72 hours.  Urine Studies No results for input(s): UHGB, CRYS in the last  72 hours.  Invalid input(s): UACOL, UAPR, USPG, UPH, UTP, UGL, UKET, UBIL, UNIT, UROB, ULEU, UEPI, UWBC, URBC, UBAC, CAST, UCOM, BILUA  MICROBIOLOGY: No results found for this or any previous visit (from the past 240 hour(s)).  RADIOLOGY STUDIES/RESULTS: Dg Chest 2 View  12/20/2015  CLINICAL DATA:  Mid upper chest pain for 1 week. Shortness of breath. EXAM: CHEST  2 VIEW COMPARISON:  06/20/2015 FINDINGS: The cardiomediastinal contours are unchanged, heart upper limits of normal in size. The lungs are clear. Pulmonary vasculature is normal. No consolidation, pleural effusion, or pneumothorax. No acute osseous abnormalities are seen. IMPRESSION: No acute pulmonary process. Electronically Signed   By: Rubye OaksMelanie  Ehinger M.D.   On: 12/20/2015 03:08    Jeoffrey MassedGHIMIRE,SHANKER, MD  [facility] Pager:[phone]  If 7PM-7AM, please contact night-coverage [email or web address] Password [email or web address] 12/20/2015, 3:28 PM   LOS: 0 days

## 2015-12-20 NOTE — H&P (Signed)
[facility] History and Physical  Sarah GurneyFreddie Mae Klinedinst XBJ:[id number] DOB: 02/23/1936 DOA: 12/20/2015  Referring physician: EDP PCP: Cain SaupeFULP, CAMMIE, MD   Chief Complaint: Chest pain   HPI: Sarah Weaver is a 80 y.o. female who presents to the ED with c/o chest pain.  Pain is moderate, throbbing, non-radiating, and substernal.  Symptoms onset yesterday and worsened one hour ago PTA while at rest.  Patient has been occasionally complaining of CP in the recent past and her PCP actually had referred her to cardiology although she hadnt been seen yet.  No history of MI, but patient does have numerous risk factors including DM, HTN, HLD, and sedentary life style.  Pain with moderate relief with NTG.  Review of Systems: Systems reviewed.  As above, otherwise negative  Past Medical History  Diagnosis Date  . Hyperlipidemia   . Hypertension   . Glaucoma   . Arthritis   . Diabetes mellitus    Past Surgical History  Procedure Laterality Date  . Tonsillectomy    . Cholecystectomy  04/23/11    lap chole   Social History:  reports that she has never smoked. She does not have any smokeless tobacco history on file. She reports that she does not drink alcohol or use illicit drugs.  No Known Allergies  Family History  Problem Relation Age of Onset  . Heart disease Mother   . Heart disease Sister   . Cancer Sister      Prior to Admission medications   Medication Sig Start Date End Date Taking? Authorizing Provider  aspirin EC 81 MG tablet Take 81 mg by mouth daily.   Yes Historical Provider, MD  atenolol (TENORMIN) 50 MG tablet Take 50 mg by mouth 2 (two) times daily.     Yes Historical Provider, MD  brinzolamide (AZOPT) 1 % ophthalmic suspension Place 1 drop into both eyes 3 (three) times daily.    Yes Historical Provider, MD  cholecalciferol (VITAMIN D) 1000 UNITS tablet Take 1,000 Units by mouth daily.   Yes Historical Provider, MD  glimepiride (AMARYL) 1 MG tablet Take 1 mg  by mouth daily with breakfast.   Yes Historical Provider, MD  latanoprost (XALATAN) 0.005 % ophthalmic solution Place 1 drop into both eyes at bedtime.    Yes Historical Provider, MD  losartan-hydrochlorothiazide (HYZAAR) 100-25 MG tablet Take 1 tablet by mouth daily.   Yes Historical Provider, MD  metFORMIN (GLUCOPHAGE) 1000 MG tablet Take 1,000 mg by mouth daily with breakfast.    Yes Historical Provider, MD  NIFEdipine (ADALAT CC) 90 MG 24 hr tablet Take 90 mg by mouth daily.     Yes Historical Provider, MD  simvastatin (ZOCOR) 40 MG tablet Take 40 mg by mouth at bedtime.     Yes Historical Provider, MD  sitaGLIPtin (JANUVIA) 50 MG tablet Take 50 mg by mouth daily.   Yes Historical Provider, MD   Physical Exam: Filed Vitals:   12/20/15 0337 12/20/15 0342  BP: 131/63 121/53  Pulse:  86  Temp:    Resp:  21    BP 121/53 mmHg  Pulse 86  Temp(Src) 97.4 F (36.3 C) (Oral)  Resp 21  Ht 5\' 4"  (1.626 m)  SpO2 90%  General Appearance:    Alert, oriented, no distress, appears stated age  Head:    Normocephalic, atraumatic  Eyes:    PERRL, EOMI, sclera non-icteric        Nose:   Nares without drainage or epistaxis. Mucosa, turbinates normal  Throat:   Moist mucous membranes. Oropharynx without erythema or exudate.  Neck:   Supple. No carotid bruits.  No thyromegaly.  No lymphadenopathy.   Back:     No CVA tenderness, no spinal tenderness  Lungs:     Clear to auscultation bilaterally, without wheezes, rhonchi or rales  Chest wall:    No tenderness to palpitation  Heart:    Regular rate and rhythm without murmurs, gallops, rubs  Abdomen:     Soft, non-tender, nondistended, normal bowel sounds, no organomegaly  Genitalia:    deferred  Rectal:    deferred  Extremities:   No clubbing, cyanosis or edema.  Pulses:   2+ and symmetric all extremities  Skin:   Skin color, texture, turgor normal, no rashes or lesions  Lymph nodes:   Cervical, supraclavicular, and axillary nodes normal   Neurologic:   CNII-XII intact. Normal strength, sensation and reflexes      throughout    Labs on Admission:  Basic Metabolic Panel:  Recent Labs Lab 12/20/15 0155  NA 139  K 4.0  CL 98*  CO2 27  GLUCOSE 275*  BUN 17  CREATININE 1.48*  CALCIUM 9.7  MG 1.7   Liver Function Tests: No results for input(s): AST, ALT, ALKPHOS, BILITOT, PROT, ALBUMIN in the last 168 hours. No results for input(s): LIPASE, AMYLASE in the last 168 hours. No results for input(s): AMMONIA in the last 168 hours. CBC:  Recent Labs Lab 12/20/15 0155  WBC 5.2  HGB 11.3*  HCT 36.5  MCV 88.4  PLT 199   Cardiac Enzymes: No results for input(s): CKTOTAL, CKMB, CKMBINDEX, TROPONINI in the last 168 hours.  BNP (last 3 results) No results for input(s): PROBNP in the last 8760 hours. CBG: No results for input(s): GLUCAP in the last 168 hours.  Radiological Exams on Admission: Dg Chest 2 View  12/20/2015  CLINICAL DATA:  Mid upper chest pain for 1 week. Shortness of breath. EXAM: CHEST  2 VIEW COMPARISON:  06/20/2015 FINDINGS: The cardiomediastinal contours are unchanged, heart upper limits of normal in size. The lungs are clear. Pulmonary vasculature is normal. No consolidation, pleural effusion, or pneumothorax. No acute osseous abnormalities are seen. IMPRESSION: No acute pulmonary process. Electronically Signed   By: Rubye Oaks M.D.   On: 12/20/2015 03:08    EKG: Independently reviewed.  Assessment/Plan Principal Problem:   Chest pain, moderate coronary artery risk Active Problems:   DM2 (diabetes mellitus, type 2) (HCC)   HTN (hypertension)   HLD (hyperlipidemia)   1. Chest pain - 1. Numerous risk factors 2. HEART score of 6 3. Chest pain obs pathway 4. Call cards in AM 5. Tele monitor 6. NPO 2. DM2 - 1. Sensitive scale SSI Q4H and holding home meds while NPO 3. HTN - continue home meds 4. HLD - continue statin    Code Status: Full  Family Communication: Family at  bedside Disposition Plan: Admit to obs   Time spent: 50 min  GARDNER, JARED M. [facility] Pager [phone]  If 7AM-7PM, please contact the day team taking care of the patient [email or web address] Password [email or web address] 12/20/2015, 5:02 AM

## 2015-12-20 NOTE — Progress Notes (Signed)
TR BAND REMOVAL  LOCATION:  right radial  DEFLATED PER PROTOCOL:  Yes.    TIME BAND OFF / DRESSING APPLIED:   2215   SITE UPON ARRIVAL:   Level 0  SITE AFTER BAND REMOVAL:  Level 0  CIRCULATION SENSATION AND MOVEMENT:  Within Normal Limits  Yes.    COMMENTS:    

## 2015-12-20 NOTE — Progress Notes (Signed)
ANTICOAGULATION CONSULT NOTE - Initial Consult  Pharmacy Consult for heparin Indication: chest pain/ACS  No Known Allergies  Patient Measurements: Height: 5\' 4"  (162.6 cm) IBW/kg (Calculated) : 54.7 Pt stated wt 293 - 296 lbs ~ 134 KG   Vital Signs: Temp: 97.4 F (36.3 C) (04/06 0138) Temp Source: Oral (04/06 0138) BP: 123/51 mmHg (04/06 0849) Pulse Rate: 71 (04/06 0849)  Labs:  Recent Labs  12/20/15 0155 12/20/15 0504 12/20/15 0753  HGB 11.3*  --   --   HCT 36.5  --   --   PLT 199  --   --   CREATININE 1.48*  --   --   TROPONINI  --  0.05* 0.15*    CrCl cannot be calculated (Unknown ideal weight.).   Medical History: Past Medical History  Diagnosis Date  . Hyperlipidemia   . Hypertension   . Glaucoma   . Arthritis   . Diabetes mellitus    Assessment: 80 yo F in ED with CP.  Pharmacy consulted to dose heparin for ACS/STEMI. Pt stated wt 293 - 296 lbs ~ 134 KG.  Hg 11.3, PLTC WNL, trop 0.15.  Creat 1.48.  Symptoms concerning for angina.    Goal of Therapy:  Heparin level 0.3-0.7 units/ml Monitor platelets by anticoagulation protocol: Yes   Plan:  Give 4000 units bolus x 1 Start heparin infusion at 1400 units/hr Check anti-Xa level in 8 hours and daily while on heparin Continue to monitor H&H and platelets  Herby AbrahamMichelle T. Johntavius Shepard, Pharm.D. [phone] 12/20/2015 9:17 AM

## 2015-12-20 NOTE — ED Provider Notes (Signed)
CSN: [id number]     Arrival date & time 12/20/15  0122 History  By signing my name below, I, Doreatha MartinEva Mathews, attest that this documentation has been prepared under the direction and in the presence of Tomasita CrumbleAdeleke Roland Prine, MD. Electronically Signed: Doreatha MartinEva Mathews, ED Scribe. 12/20/2015. 2:12 AM.    Chief Complaint  Patient presents with  . Chest Pain   The history is provided by the patient. No language interpreter was used.   HPI Comments: Sarah Weaver is a 80 y.o. female brought in by ambulance with h/o HLD, HTN, DM who presents to the Emergency Department complaining of moderate, throbbing, non-radiating, substernal CP onset yesterday and worsened one hour ago while at rest with associated SOB. Family reports that the pt occasionally complains of CP and notes that her PCP is planning on referring her to cardiology. No h/o PE/DVT, MI. There are palpitations associated as well. Pt also reports mild recent productive cough at night. She reports mild to moderate relief of pain en route with NTG. Pt has not been evaluated by a cardiologist. PCP is Dr. Thomes LollingFulk. Denies emesis, diaphoresis. She also denies rhinorrhea, fever.   Past Medical History  Diagnosis Date  . Hyperlipidemia   . Hypertension   . Glaucoma   . Arthritis   . Diabetes mellitus    Past Surgical History  Procedure Laterality Date  . Tonsillectomy    . Cholecystectomy  04/23/11    lap chole   Family History  Problem Relation Age of Onset  . Heart disease Mother   . Heart disease Sister   . Cancer Sister    Social History  Substance Use Topics  . Smoking status: Never Smoker   . Smokeless tobacco: None  . Alcohol Use: No   OB History    No data available     Review of Systems A complete 10 system review of systems was obtained and all systems are negative except as noted in the HPI and PMH.    Allergies  Review of patient's allergies indicates no known allergies.  Home Medications   Prior to Admission medications    Medication Sig Start Date End Date Taking? Authorizing Provider  aspirin EC 81 MG tablet Take 81 mg by mouth daily.    Historical Provider, MD  atenolol (TENORMIN) 50 MG tablet Take 50 mg by mouth 2 (two) times daily.      Historical Provider, MD  B Complex-C (B-COMPLEX WITH VITAMIN C) tablet Take 1 tablet by mouth daily.    Historical Provider, MD  brinzolamide (AZOPT) 1 % ophthalmic suspension Place 1 drop into both eyes 3 (three) times daily.     Historical Provider, MD  cholecalciferol (VITAMIN D) 1000 UNITS tablet Take 1,000 Units by mouth daily.    Historical Provider, MD  cyclobenzaprine (FEXMID) 7.5 MG tablet Take 1 tablet (7.5 mg total) by mouth 2 (two) times daily as needed for muscle spasms. 06/20/15   Theda Belfasthris Tegeler, MD  glimepiride (AMARYL) 4 MG tablet Take 4 mg by mouth daily before breakfast.      Historical Provider, MD  HYDROcodone-acetaminophen (NORCO/VICODIN) 5-325 MG tablet Take 1 tablet by mouth every 4 (four) hours as needed for severe pain. 06/20/15   Theda Belfasthris Tegeler, MD  latanoprost (XALATAN) 0.005 % ophthalmic solution Place 1 drop into both eyes at bedtime.     Historical Provider, MD  losartan-hydrochlorothiazide (HYZAAR) 100-25 MG tablet Take 1 tablet by mouth daily.    Historical Provider, MD  metFORMIN (GLUCOPHAGE) 1000 MG  tablet Take 1,000 mg by mouth daily with breakfast.     Historical Provider, MD  NIFEdipine (ADALAT CC) 90 MG 24 hr tablet Take 90 mg by mouth daily.      Historical Provider, MD  simvastatin (ZOCOR) 40 MG tablet Take 40 mg by mouth at bedtime.      Historical Provider, MD  sitaGLIPtin (JANUVIA) 50 MG tablet Take 50 mg by mouth daily.    Historical Provider, MD   BP 152/68 mmHg  Pulse 95  Temp(Src) 97.4 F (36.3 C) (Oral)  Resp 12  Ht  (1.626 m)  SpO2 93% Physical Exam  Constitutional: She is oriented to person, place, and time. She appears well-developed and well-nourished. No distress.  Obese. Nasal cannula in place.   HENT:  Head:  Normocephalic and atraumatic.  Nose: Nose normal.  Mouth/Throat: Oropharynx is clear and moist. No oropharyngeal exudate.  Eyes: Conjunctivae and EOM are normal. Pupils are equal, round, and reactive to light. No scleral icterus.  Neck: Normal range of motion. Neck supple. No JVD present. No tracheal deviation present. No thyromegaly present.  Cardiovascular: Normal rate, regular rhythm and normal heart sounds.  Exam reveals no gallop and no friction rub.   No murmur heard. Pulmonary/Chest: Effort normal and breath sounds normal. No respiratory distress. She has no wheezes. She exhibits no tenderness.  Abdominal: Soft. Bowel sounds are normal. She exhibits no distension and no mass. There is no tenderness. There is no rebound and no guarding.  Musculoskeletal: Normal range of motion. She exhibits edema. She exhibits no tenderness.  Bilateral lower extremity edema.   Lymphadenopathy:    She has no cervical adenopathy.  Neurological: She is alert and oriented to person, place, and time. No cranial nerve deficit. She exhibits normal muscle tone.  Skin: Skin is warm and dry. No rash noted. No erythema. No pallor.  Nursing note and vitals reviewed.   ED Course  Procedures (including critical care time) DIAGNOSTIC STUDIES: Oxygen Saturation is 93% on RA, low by my interpretation.    COORDINATION OF CARE: 2:10 AM Discussed treatment plan with pt at bedside which includes lab work, CXR, EKG and pt agreed to plan.   Labs Review Labs Reviewed  BASIC METABOLIC PANEL - Abnormal; Notable for the following:    Chloride 98 (*)    Glucose, Bld 275 (*)    Creatinine, Ser 1.48 (*)    GFR calc non Af Amer 33 (*)    GFR calc Af Amer 38 (*)    All other components within normal limits  CBC - Abnormal; Notable for the following:    Hemoglobin 11.3 (*)    All other components within normal limits  MAGNESIUM  BRAIN NATRIURETIC PEPTIDE  I-STAT TROPOININ, ED    Imaging Review Dg Chest 2  View  12/20/2015  CLINICAL DATA:  Mid upper chest pain for 1 week. Shortness of breath. EXAM: CHEST  2 VIEW COMPARISON:  06/20/2015 FINDINGS: The cardiomediastinal contours are unchanged, heart upper limits of normal in size. The lungs are clear. Pulmonary vasculature is normal. No consolidation, pleural effusion, or pneumothorax. No acute osseous abnormalities are seen. IMPRESSION: No acute pulmonary process. Electronically Signed   By: Rubye Oaks M.D.   On: 12/20/2015 03:08   I have personally reviewed and evaluated these images and lab results as part of my medical decision-making.   EKG Interpretation   Date/Time:  Thursday December 20 2015 01:30:37 EDT Ventricular Rate:  101 PR Interval:  189 QRS Duration:  91 QT Interval:  350 QTC Calculation: 454 R Axis:   -3 Text Interpretation:  Sinus tachycardia Premature atrial complexes No  significant change since last tracing Confirmed by Erroll Luna  [phone]) on 12/20/2015 2:00:23 AM      MDM   Final diagnoses:  None   Patient presents to the ED for chest pain.  Her history is concerning for ACS.  Her PCP seemed to be concerned as well as they were planning to send her to cardiology and possible stress test.  She will benefit from observation for further evaluation.  Nitro relieved her pain.  Trop and EKG are unremarkable.  I will page [facility] for admission.  4:45 AM I spoke with Dr. Julian Reil who accepts the patient to tele for further care.   I personally performed the services described in this documentation, which was scribed in my presence. The recorded information has been reviewed and is accurate.     Tomasita Crumble, MD 12/20/15 [phone]

## 2015-12-20 NOTE — Interval H&P Note (Signed)
Cath Lab Visit (complete for each Cath Lab visit)  Clinical Evaluation Leading to the Procedure:   ACS: Yes.    Non-ACS:    Anginal Classification: CCS III  Anti-ischemic medical therapy: No Therapy  Non-Invasive Test Results: No non-invasive testing performed  Prior CABG: No previous CABG      History and Physical Interval Note:  12/20/2015 11:24 AM  Sarah Weaver  has presented today for surgery, with the diagnosis of n stemi  The various methods of treatment have been discussed with the patient and family. After consideration of risks, benefits and other options for treatment, the patient has consented to  Procedure(s): Left Heart Cath and Coronary Angiography (N/A) as a surgical intervention .  The patient's history has been reviewed, patient examined, no change in status, stable for surgery.  I have reviewed the patient's chart and labs.  Questions were answered to the patient's satisfaction.     [name], Jonathan

## 2015-12-20 NOTE — ED Notes (Signed)
Pt being taken up to the Cath lab at this time

## 2015-12-20 NOTE — Consult Note (Signed)
 CARDIOLOGY CONSULT NOTE   Patient ID: Sarah Weaver MRN: [id number], DOB/AGE: 02/16/1936   Admit date: 12/20/2015 Date of Consult: 12/20/2015   Primary Physician: FULP, CAMMIE, MD Primary Cardiologist: new  Pt. Profile  Sarah Weaver is a morbidly obese 80-year-old Afro-American female with past medical history of hypertension, hyperlipidemia, DM II however no past cardiac history who presented to [place] ED in the morning of 12/20/2015 for chest pain.  Problem List  Past Medical History  Diagnosis Date  . Hyperlipidemia   . Hypertension   . Glaucoma   . Arthritis   . Diabetes mellitus     Past Surgical History  Procedure Laterality Date  . Tonsillectomy    . Cholecystectomy  04/23/11    lap chole     Allergies  No Known Allergies  HPI   Sarah Weaver is a morbidly obese 80-year-old Afro-American female with past medical history of hypertension, hyperlipidemia, DM II however no past cardiac history who presented to  ED in the morning of 12/20/2015 for chest pain. According to the patient, she has no family history of early CAD, however her mother does have heart disease later in life. She has never smoked in the past and denies any illicit drug use.  He first noticed intermittent chest pain last week when she is doing laundry and other stuff. Usually last about 15 minutes and it would go away once she rested. She also complaining of associated symptoms such as shortness of breath however no diaphoresis or dizziness. She did have some dry cough last week however no recurrence, no fever or chill. Since yesterday however her chest pain would start at rest when she is watching TV. Yesterday morning, shortly after waking up, her chest pain restarted and this time it lasted a while. Yesterday around 3 PM, after fixing the dinner, her chest pain recurred and has been persistent since then. On arrival to the emergency room, her initial point-of-care troponin was  negative. Second troponin was positive at 0.05. Creatinine was at 1.5. Chest x-ray was negative for acute process. EKG showed mild sinus tachycardia, poor R wave progression anterior lead, minimal ST depression in the V6, otherwise no significant ST-T wave changes. Despite her history of coughing, her BNP was normal 47.7. In the ED room, she does have frequent O2 desats, however her physical exam is not consistent with heart failure. Her lung is clear on exam.   Inpatient Medications  . aspirin EC  81 mg Oral Daily  . atenolol  50 mg Oral BID  . brinzolamide  1 drop Both Eyes TID  . cholecalciferol  1,000 Units Oral Daily  . losartan  100 mg Oral Daily   And  . hydrochlorothiazide  25 mg Oral Daily  . insulin aspart  0-9 Units Subcutaneous 6 times per day  . latanoprost  1 drop Both Eyes QHS  . NIFEdipine  90 mg Oral Daily  . simvastatin  40 mg Oral QHS    Family History Family History  Problem Relation Age of Onset  . Heart disease Mother   . Heart disease Sister   . Cancer Sister      Social History Social History   Social History  . Marital Status: Single    Spouse Name: N/A  . Number of Children: N/A  . Years of Education: N/A   Occupational History  . Not on file.   Social History Main Topics  . Smoking status: Never Smoker   . Smokeless   tobacco: Not on file  . Alcohol Use: No  . Drug Use: No  . Sexual Activity: Not on file   Other Topics Concern  . Not on file   Social History Narrative     Review of Systems  General:  No chills, fever, night sweats or weight changes.  Cardiovascular:  No dyspnea on exertion, edema, orthopnea, palpitations, paroxysmal nocturnal dyspnea. +chest pain Dermatological: No rash, lesions/masses Respiratory: +cough, dyspnea Urologic: No hematuria, dysuria Abdominal:   No nausea, vomiting, diarrhea, bright red blood per rectum, melena, or hematemesis Neurologic:  No visual changes, wkns, changes in mental status. All other  systems reviewed and are otherwise negative except as noted above.  Physical Exam  Blood pressure 128/60, pulse 64, temperature 97.4 F (36.3 C), temperature source Oral, resp. rate 23, height 5' 4" (1.626 m), SpO2 87 %.  General: Pleasant, NAD Psych: Normal affect. Neuro: Alert and oriented X 3. Moves all extremities spontaneously. HEENT: Normal  Neck: Supple without bruits or JVD. Lungs:  Resp regular and unlabored, CTA. Heart: RRR no s3, s4, or murmurs. Abdomen: Soft, non-tender, non-distended, BS + x 4.  Extremities: No clubbing, cyanosis or edema. DP/PT/Radials 2+ and equal bilaterally.  Labs   Recent Labs  12/20/15 0504 12/20/15 0753  TROPONINI 0.05* 0.15*   Lab Results  Component Value Date   WBC 5.2 12/20/2015   HGB 11.3* 12/20/2015   HCT 36.5 12/20/2015   MCV 88.4 12/20/2015   PLT 199 12/20/2015     Recent Labs Lab 12/20/15 0155  NA 139  K 4.0  CL 98*  CO2 27  BUN 17  CREATININE 1.48*  CALCIUM 9.7  GLUCOSE 275*   Lab Results  Component Value Date   CHOL 166 03/22/2011   HDL 60 03/22/2011   LDLCALC 90 03/22/2011   TRIG 80 03/22/2011   No results found for: DDIMER  Radiology/Studies  Dg Chest 2 View  12/20/2015  CLINICAL DATA:  Mid upper chest pain for 1 week. Shortness of breath. EXAM: CHEST  2 VIEW COMPARISON:  06/20/2015 FINDINGS: The cardiomediastinal contours are unchanged, heart upper limits of normal in size. The lungs are clear. Pulmonary vasculature is normal. No consolidation, pleural effusion, or pneumothorax. No acute osseous abnormalities are seen. IMPRESSION: No acute pulmonary process. Electronically Signed   By: Melanie  Ehinger M.D.   On: 12/20/2015 03:08    ECG  Normal sinus rhythm, mildly tachycardic, minimal ST depression in V6, poor R wave progression anteriorly, otherwise no significant ST-T wave changes.  ASSESSMENT AND PLAN  1. Chest pain  - Symptoms concerning for angina, it appears she had a progression of stable to  unstable angina over the past week. Only atypical part is the fact that despite persistent chest pain since 3 PM yesterday, her troponin this morning was only 0.05.  - Continue to trend troponin, obtain echocardiogram.  - Will discuss with M.D. regarding ischemic workup, she is not a candidate for coronary CT, she is a very poor candidate for Myoview due to her morbid obesity. Best study in this case would be direct cardiac catheterization.  - IV hydration given Cr. 1.48   2. HTN  3. HLD  4. DM II  5. O2 desat: Question if related to obesity hypoventilation syndrome.  6. CKD stage III: IV hydration, unclear baseline, it was 1.3 six month ago.   Signed, Jaysie Benthall H, PA-C 12/20/2015, 9:53 AM   The patient was seen, examined and discussed with Hao Meng, PA-C and I   agree with the above.   A pleasant 80-year-old female with prior medical history of hypertension hyperlipidemia and diabetes, who presented with symptoms suspicious of unstable angina and ruled in for non-STEMI. Her EKG doesn't show any significant ischemia, however her troponin continues to rise 0.05 --> 0.15.  We will place on the left cardiac cath scheduled for today. Her baseline creatinine is 1.0-1.2 today 1.4, will start aggressive hydration. Her blood pressure is well controlled. For now we will continue her home medicines with aspirin, simvastatin, atenolol and losartan, if she is found to have coronary artery disease we will switch atenolol to carvedilol and simvastatin to atorvastatin. We'll order an echocardiogram.  Roquel Burgin H 12/20/2015  

## 2015-12-20 NOTE — H&P (View-Only) (Signed)
CARDIOLOGY CONSULT NOTE   Patient ID: Sarah Weaver MRN: [id number], DOB/AGE: 1936-01-27   Admit date: 12/20/2015 Date of Consult: 12/20/2015   Primary Physician: Cain Saupe, MD Primary Cardiologist: new  Pt. Profile  Mrs. Sarah Weaver is a morbidly obese 80 year old Afro-American female with past medical history of hypertension, hyperlipidemia, DM II however no past cardiac history who presented to [facility] ED in the morning of 12/20/2015 for chest pain.  Problem List  Past Medical History  Diagnosis Date  . Hyperlipidemia   . Hypertension   . Glaucoma   . Arthritis   . Diabetes mellitus     Past Surgical History  Procedure Laterality Date  . Tonsillectomy    . Cholecystectomy  04/23/11    lap chole     Allergies  No Known Allergies  HPI   Mrs. Sarah Weaver is a morbidly obese 80 year old Afro-American female with past medical history of hypertension, hyperlipidemia, DM II however no past cardiac history who presented to [facility] ED in the morning of 12/20/2015 for chest pain. According to the patient, she has no family history of early CAD, however her mother does have heart disease later in life. She has never smoked in the past and denies any illicit drug use.  He first noticed intermittent chest pain last week when she is doing laundry and other stuff. Usually last about 15 minutes and it would go away once she rested. She also complaining of associated symptoms such as shortness of breath however no diaphoresis or dizziness. She did have some dry cough last week however no recurrence, no fever or chill. Since yesterday however her chest pain would start at rest when she is watching TV. Yesterday morning, shortly after waking up, her chest pain restarted and this time it lasted a while. Yesterday around 3 PM, after fixing the dinner, her chest pain recurred and has been persistent since then. On arrival to the emergency room, her initial point-of-care troponin was  negative. Second troponin was positive at 0.05. Creatinine was at 1.5. Chest x-ray was negative for acute process. EKG showed mild sinus tachycardia, poor R wave progression anterior lead, minimal ST depression in the V6, otherwise no significant ST-T wave changes. Despite her history of coughing, her BNP was normal 47.7. In the ED room, she does have frequent O2 desats, however her physical exam is not consistent with heart failure. Her lung is clear on exam.   Inpatient Medications  . aspirin EC  81 mg Oral Daily  . atenolol  50 mg Oral BID  . brinzolamide  1 drop Both Eyes TID  . cholecalciferol  1,000 Units Oral Daily  . losartan  100 mg Oral Daily   And  . hydrochlorothiazide  25 mg Oral Daily  . insulin aspart  0-9 Units Subcutaneous 6 times per day  . latanoprost  1 drop Both Eyes QHS  . NIFEdipine  90 mg Oral Daily  . simvastatin  40 mg Oral QHS    Family History Family History  Problem Relation Age of Onset  . Heart disease Mother   . Heart disease Sister   . Cancer Sister      Social History Social History   Social History  . Marital Status: Single    Spouse Name: N/A  . Number of Children: N/A  . Years of Education: N/A   Occupational History  . Not on file.   Social History Main Topics  . Smoking status: Never Smoker   . Smokeless  tobacco: Not on file  . Alcohol Use: No  . Drug Use: No  . Sexual Activity: Not on file   Other Topics Concern  . Not on file   Social History Narrative     Review of Systems  General:  No chills, fever, night sweats or weight changes.  Cardiovascular:  No dyspnea on exertion, edema, orthopnea, palpitations, paroxysmal nocturnal dyspnea. +chest pain Dermatological: No rash, lesions/masses Respiratory: +cough, dyspnea Urologic: No hematuria, dysuria Abdominal:   No nausea, vomiting, diarrhea, bright red blood per rectum, melena, or hematemesis Neurologic:  No visual changes, wkns, changes in mental status. All other  systems reviewed and are otherwise negative except as noted above.  Physical Exam  Blood pressure 128/60, pulse 64, temperature 97.4 F (36.3 C), temperature source Oral, resp. rate 23, height  (1.626 m), SpO2 87 %.  General: Pleasant, NAD Psych: Normal affect. Neuro: Alert and oriented X 3. Moves all extremities spontaneously. HEENT: Normal  Neck: Supple without bruits or JVD. Lungs:  Resp regular and unlabored, CTA. Heart: RRR no s3, s4, or murmurs. Abdomen: Soft, non-tender, non-distended, BS + x 4.  Extremities: No clubbing, cyanosis or edema. DP/PT/Radials 2+ and equal bilaterally.  Labs   Recent Labs  12/20/15 0504 12/20/15 0753  TROPONINI 0.05* 0.15*   Lab Results  Component Value Date   WBC 5.2 12/20/2015   HGB 11.3* 12/20/2015   HCT 36.5 12/20/2015   MCV 88.4 12/20/2015   PLT 199 12/20/2015     Recent Labs Lab 12/20/15 0155  NA 139  K 4.0  CL 98*  CO2 27  BUN 17  CREATININE 1.48*  CALCIUM 9.7  GLUCOSE 275*   Lab Results  Component Value Date   CHOL 166 03/22/2011   HDL 60 03/22/2011   LDLCALC 90 03/22/2011   TRIG 80 03/22/2011   No results found for: DDIMER  Radiology/Studies  Dg Chest 2 View  12/20/2015  CLINICAL DATA:  Mid upper chest pain for 1 week. Shortness of breath. EXAM: CHEST  2 VIEW COMPARISON:  06/20/2015 FINDINGS: The cardiomediastinal contours are unchanged, heart upper limits of normal in size. The lungs are clear. Pulmonary vasculature is normal. No consolidation, pleural effusion, or pneumothorax. No acute osseous abnormalities are seen. IMPRESSION: No acute pulmonary process. Electronically Signed   By: Rubye Oaks M.D.   On: 12/20/2015 03:08    ECG  Normal sinus rhythm, mildly tachycardic, minimal ST depression in V6, poor R wave progression anteriorly, otherwise no significant ST-T wave changes.  ASSESSMENT AND PLAN  1. Chest pain  - Symptoms concerning for angina, it appears she had a progression of stable to  unstable angina over the past week. Only atypical part is the fact that despite persistent chest pain since 3 PM yesterday, her troponin this morning was only 0.05.  - Continue to trend troponin, obtain echocardiogram.  - Will discuss with M.D. regarding ischemic workup, she is not a candidate for coronary CT, she is a very poor candidate for Myoview due to her morbid obesity. Best study in this case would be direct cardiac catheterization.  - IV hydration given Cr. 1.48   2. HTN  3. HLD  4. DM II  5. O2 desat: Question if related to obesity hypoventilation syndrome.  6. CKD stage III: IV hydration, unclear baseline, it was 1.3 six month ago.   SignedLars Masson, PA-C 12/20/2015, 9:53 AM   The patient was seen, examined and discussed with Azalee Course, PA-C and I  agree with the above.   A pleasant 80 year old female with prior medical history of hypertension hyperlipidemia and diabetes, who presented with symptoms suspicious of unstable angina and ruled in for non-STEMI. Her EKG doesn't show any significant ischemia, however her troponin continues to rise 0.05 --> 0.15.  We will place on the left cardiac cath scheduled for today. Her baseline creatinine is 1.0-1.2 today 1.4, will start aggressive hydration. Her blood pressure is well controlled. For now we will continue her home medicines with aspirin, simvastatin, atenolol and losartan, if she is found to have coronary artery disease we will switch atenolol to carvedilol and simvastatin to atorvastatin. We'll order an echocardiogram.  Lars MassonELSON, Torah Pinnock H 12/20/2015

## 2015-12-20 NOTE — ED Notes (Signed)
Admitting Cards at the bedside

## 2015-12-21 ENCOUNTER — Encounter (HOSPITAL_COMMUNITY): Payer: Self-pay | Admitting: Cardiovascular Disease

## 2015-12-21 ENCOUNTER — Telehealth: Payer: Self-pay | Admitting: Cardiology

## 2015-12-21 ENCOUNTER — Inpatient Hospital Stay (HOSPITAL_COMMUNITY): Payer: Medicare Other

## 2015-12-21 DIAGNOSIS — I10 Essential (primary) hypertension: Secondary | ICD-10-CM

## 2015-12-21 DIAGNOSIS — I209 Angina pectoris, unspecified: Secondary | ICD-10-CM

## 2015-12-21 DIAGNOSIS — I2510 Atherosclerotic heart disease of native coronary artery without angina pectoris: Secondary | ICD-10-CM

## 2015-12-21 LAB — LIPID PANEL
Cholesterol: 150 mg/dL (ref 0–200)
HDL: 46 mg/dL (ref >40)
LDL Cholesterol: 89 mg/dL (ref 0–99)
Total CHOL/HDL Ratio: 3.3 RATIO
Triglycerides: 74 mg/dL (ref <150)
VLDL: 15 mg/dL (ref 0–40)

## 2015-12-21 LAB — GLUCOSE, CAPILLARY
Glucose-Capillary: 161 mg/dL — ABNORMAL HIGH (ref 65–99)
Glucose-Capillary: 148 mg/dL — ABNORMAL HIGH (ref 65–99)

## 2015-12-21 LAB — CBC
HCT: 35.9 % — ABNORMAL LOW (ref 36.0–46.0)
Hemoglobin: 11.9 g/dL — ABNORMAL LOW (ref 12.0–15.0)
MCH: 28.8 pg (ref 26.0–34.0)
MCHC: 33.1 g/dL (ref 30.0–36.0)
MCV: 86.9 fL (ref 78.0–100.0)
Platelets: 209 10*3/uL (ref 150–400)
RBC: 4.13 MIL/uL (ref 3.87–5.11)
RDW: 13.6 % (ref 11.5–15.5)
WBC: 4.7 10*3/uL (ref 4.0–10.5)

## 2015-12-21 LAB — BASIC METABOLIC PANEL
Anion gap: 12 (ref 5–15)
BUN: 18 mg/dL (ref 6–20)
CO2: 21 mmol/L — ABNORMAL LOW (ref 22–32)
Calcium: 9.3 mg/dL (ref 8.9–10.3)
Chloride: 102 mmol/L (ref 101–111)
Creatinine, Ser: 1.27 mg/dL — ABNORMAL HIGH (ref 0.44–1.00)
GFR calc Af Amer: 46 mL/min — ABNORMAL LOW (ref >60)
GFR calc non Af Amer: 39 mL/min — ABNORMAL LOW (ref >60)
Glucose, Bld: 162 mg/dL — ABNORMAL HIGH (ref 65–99)
Potassium: 4.9 mmol/L (ref 3.5–5.1)
Sodium: 135 mmol/L (ref 135–145)

## 2015-12-21 LAB — ECHOCARDIOGRAM COMPLETE
Height: 64 in
Weight: 4672 oz

## 2015-12-21 LAB — HEMOGLOBIN A1C
Hgb A1c MFr Bld: 9.1 % — ABNORMAL HIGH (ref 4.8–5.6)
Mean Plasma Glucose: 214 mg/dL

## 2015-12-21 MED ORDER — TICAGRELOR 90 MG PO TABS: 90.0000 mg | ORAL_TABLET | Freq: Two times a day (BID) | ORAL | Status: DC

## 2015-12-21 MED ORDER — LISINOPRIL 2.5 MG PO TABS: 2.5000 mg | ORAL_TABLET | Freq: Every day | ORAL | Status: DC

## 2015-12-21 MED ORDER — PERFLUTREN LIPID MICROSPHERE
INTRAVENOUS | Status: AC
  Filled 2015-12-21: qty 10

## 2015-12-21 MED ORDER — ATORVASTATIN CALCIUM 80 MG PO TABS: 80.0000 mg | ORAL_TABLET | Freq: Every day | ORAL | Status: DC

## 2015-12-21 MED ORDER — CARVEDILOL 3.125 MG PO TABS: 3.1250 mg | ORAL_TABLET | Freq: Two times a day (BID) | ORAL | Status: DC

## 2015-12-21 MED ORDER — PERFLUTREN LIPID MICROSPHERE
1.0000 mL | INTRAVENOUS | Status: AC | PRN
  Administered 2015-12-21: 12:00:00 3 mL via INTRAVENOUS
  Filled 2015-12-21: qty 10

## 2015-12-21 MED ORDER — LISINOPRIL 5 MG PO TABS
2.5000 mg | ORAL_TABLET | Freq: Every day | ORAL | Status: DC
  Administered 2015-12-21: 11:00:00 2.5 mg via ORAL
  Filled 2015-12-21: qty 1

## 2015-12-21 NOTE — Progress Notes (Signed)
Echocardiogram 2D Echocardiogram with Definity has been performed.  Nolon RodBrown, Tony 12/21/2015, 12:25 PM

## 2015-12-21 NOTE — Telephone Encounter (Signed)
According to progress note, the pt is still currently in the hospital, but plan is too discharge today if her BP is stable.  Will keep on triage desktop to follow-up on this pt for TCM call.

## 2015-12-21 NOTE — Progress Notes (Signed)
Notified MD SBP  In 70's patient was sleeping and asymptomatic. Plan is continue to monitor.

## 2015-12-21 NOTE — Telephone Encounter (Signed)
New message    TCM appt on  4/14 @ 9:30 am with Sarah Weaver- Per Sarah Weaver

## 2015-12-21 NOTE — Progress Notes (Signed)
CARDIAC REHAB PHASE I   PRE:  Rate/Rhythm: 63 SR  BP:  Supine:   Sitting: 81/21,  115/41  Standing:    SaO2: 99% 1L  MODE:  Ambulation: 140 ft   POST:  Rate/Rhythm: 81 SR  BP:  Supine:   Sitting: 134/33  Standing:    SaO2: 96%RA 0850-0950 Pt uses cane when she walks long distance. Used rolling walker to walk 140 ft with asst x 1. Stopped at 70 ft to rest. Some DOE noted. Sats good on RA. No c/o CP. Gave MI booklet but pt unaware of MI. Reviewed NTG use, carb counting, importance of brilinta with stent, and encouraged walking as tolerated. Did not give written ex ed as pt limited in mobility. Discussed CRP 2 and [facility] Got pt in bed after walk because going to have 2D Echo later. Encouraged pt to watch carbs with A1C 9.1. Stated usually eats what she is craving.   Luetta Nuttingharlene Quinne Pires, RN BSN  12/21/2015 9:45 AM

## 2015-12-21 NOTE — Discharge Summary (Signed)
PATIENT DETAILS Name: Sarah GurneyFreddie Mae Weaver Age: 80 y.o. Sex: female Date of Birth: 09/11/1936 MRN: [id number]. Admitting Physician: Runell GessJonathan J Berry, MD NWG:NFAOPCP:FULP, CAMMIE, MD  Admit Date: 12/20/2015 Discharge date: 12/21/2015  Recommendations for Outpatient Follow-up:  1. New medications-aspirin, Brilinta, Lipitor, Coreg and lisinopril.  2. Please repeat CBC/BMET at next visit 3. Please ensure follow-up with cardiology  PRIMARY DISCHARGE DIAGNOSIS:  Principal Problem:   Chest pain, moderate coronary artery risk Active Problems:   DM2 (diabetes mellitus, type 2) (HCC)   HTN (hypertension)   HLD (hyperlipidemia)   Ischemic chest pain (HCC)      PAST MEDICAL HISTORY: Past Medical History  Diagnosis Date  . Hyperlipidemia   . Hypertension   . Glaucoma   . Coronary artery disease   . Type II diabetes mellitus (HCC)   . Arthritis     "all over"  . Chronic lower back pain   . Glaucoma of both eyes     DISCHARGE MEDICATIONS: Current Discharge Medication List    START taking these medications   Details  atorvastatin (LIPITOR) 80 MG tablet Take 1 tablet (80 mg total) by mouth daily at 6 PM. Qty: 90 tablet, Refills: 0    carvedilol (COREG) 3.125 MG tablet Take 1 tablet (3.125 mg total) by mouth 2 (two) times daily with a meal. Qty: 120 tablet, Refills: 0    lisinopril (PRINIVIL,ZESTRIL) 2.5 MG tablet Take 1 tablet (2.5 mg total) by mouth daily. Qty: 60 tablet, Refills: 0    ticagrelor (BRILINTA) 90 MG TABS tablet Take 1 tablet (90 mg total) by mouth 2 (two) times daily. Qty: 180 tablet, Refills: 3      CONTINUE these medications which have NOT CHANGED   Details  aspirin EC 81 MG tablet Take 81 mg by mouth daily.    brinzolamide (AZOPT) 1 % ophthalmic suspension Place 1 drop into both eyes 3 (three) times daily.     cholecalciferol (VITAMIN D) 1000 UNITS tablet Take 1,000 Units by mouth daily.    glimepiride (AMARYL) 1 MG tablet Take 1 mg by mouth daily with  breakfast.    latanoprost (XALATAN) 0.005 % ophthalmic solution Place 1 drop into both eyes at bedtime.     metFORMIN (GLUCOPHAGE) 1000 MG tablet Take 1,000 mg by mouth daily with breakfast.     sitaGLIPtin (JANUVIA) 50 MG tablet Take 50 mg by mouth daily.      STOP taking these medications     atenolol (TENORMIN) 50 MG tablet      losartan-hydrochlorothiazide (HYZAAR) 100-25 MG tablet      NIFEdipine (ADALAT CC) 90 MG 24 hr tablet      simvastatin (ZOCOR) 40 MG tablet         ALLERGIES:  No Known Allergies  BRIEF HPI:  See H&P, Labs, Consult and Test reports for all details in brief, patient was admitted for Attenuation of chest pain, she was found to have non-STEMI.  CONSULTATIONS:   cardiology  PERTINENT RADIOLOGIC STUDIES: Dg Chest 2 View  12/20/2015  CLINICAL DATA:  Mid upper chest pain for 1 week. Shortness of breath. EXAM: CHEST  2 VIEW COMPARISON:  06/20/2015 FINDINGS: The cardiomediastinal contours are unchanged, heart upper limits of normal in size. The lungs are clear. Pulmonary vasculature is normal. No consolidation, pleural effusion, or pneumothorax. No acute osseous abnormalities are seen. IMPRESSION: No acute pulmonary process. Electronically Signed   By: Rubye OaksMelanie  Ehinger M.D.   On: 12/20/2015 03:08     PERTINENT LAB RESULTS:  CBC:  Recent Labs  12/20/15 0155 12/21/15 0400  WBC 5.2 4.7  HGB 11.3* 11.9*  HCT 36.5 35.9*  PLT 199 209   CMET CMP     Component Value Date/Time   NA 135 12/21/2015 0400   K 4.9 12/21/2015 0400   CL 102 12/21/2015 0400   CO2 21* 12/21/2015 0400   GLUCOSE 162* 12/21/2015 0400   BUN 18 12/21/2015 0400   CREATININE 1.27* 12/21/2015 0400   CALCIUM 9.3 12/21/2015 0400   PROT 6.9 04/21/2011 0837   ALBUMIN 3.7 04/21/2011 0837   AST 15 04/21/2011 0837   ALT 15 04/21/2011 0837   ALKPHOS 58 04/21/2011 0837   BILITOT 0.4 04/21/2011 0837   GFRNONAA 39* 12/21/2015 0400   GFRAA 46* 12/21/2015 0400    GFR Estimated  Creatinine Clearance: 49.4 mL/min (by C-G formula based on Cr of 1.27). No results for input(s): LIPASE, AMYLASE in the last 72 hours.  Recent Labs  12/20/15 0504 12/20/15 0753 12/20/15 1551  TROPONINI 0.05* 0.15* 0.41*   Invalid input(s): POCBNP No results for input(s): DDIMER in the last 72 hours.  Recent Labs  12/20/15 1632  HGBA1C 9.1*    Recent Labs  12/21/15 0400  CHOL 150  HDL 46  LDLCALC 89  TRIG 74  CHOLHDL 3.3   No results for input(s): TSH, T4TOTAL, T3FREE, THYROIDAB in the last 72 hours.  Invalid input(s): FREET3 No results for input(s): VITAMINB12, FOLATE, FERRITIN, TIBC, IRON, RETICCTPCT in the last 72 hours. Coags: No results for input(s): INR in the last 72 hours.  Invalid input(s): PT Microbiology: No results found for this or any previous visit (from the past 240 hour(s)).   BRIEF HOSPITAL COURSE:  Non-STEMI:seen by Cards, underwent LHC with PCI to mid LAD. Recommendations are to continue ASA,Brilinta, statin and beta blocker.2-D echocardiogram showed preserved ejection fraction. Outpatient appointment with cardiology made-no further recommendations from cardiology, okay to discharge.  Active Problems: DM2 (diabetes mellitus, type 2):CBG's stable-continue SSI. Resume oral hypoglycemics on discharge. Further optimization deferred to outpatient M.D. Oral Hypoglycemics on hold-Await A1C.  HTN (hypertension): Currently controlled-did have a brief period of hypotension earlier this morning. Medications have been adjusted-she is now on Coreg and low-dose lisinopril  HLD (hyperlipidemia):continue statin-we have substituted Lipitor for Zocor.  TODAY-DAY OF DISCHARGE:  Subjective:   Sarah Weaver today has no headache,no chest abdominal pain,no new weakness tingling or numbness, feels much better wants to go home today.   Objective:   Blood pressure 93/20, pulse 64, temperature 98 F (36.7 C), temperature source Oral, resp. rate 16, height 5'  4" (1.626 m), weight 132.45 kg (292 lb), SpO2 100 %.  Intake/Output Summary (Last 24 hours) at 12/21/15 1031 Last data filed at 12/21/15 0823  Gross per 24 hour  Intake 1219.1 ml  Output   1300 ml  Net  -80.9 ml   Filed Weights   12/20/15 1550  Weight: 132.45 kg (292 lb)    Exam Awake Alert, Oriented *3, No new F.N deficits, Normal affect [place].AT,PERRAL Supple Neck,No JVD, No cervical lymphadenopathy appriciated.  Symmetrical Chest wall movement, Good air movement bilaterally, CTAB RRR,No Gallops,Rubs or new Murmurs, No Parasternal Heave +ve B.Sounds, Abd Soft, Non tender, No organomegaly appriciated, No rebound -guarding or rigidity. No Cyanosis, Clubbing or edema, No new Rash or bruise  DISCHARGE CONDITION: Stable  DISPOSITION: Home  DISCHARGE INSTRUCTIONS:    Activity:  As tolerated with Full fall precautions use walker/cane & assistance as needed  Get Medicines reviewed and adjusted:  Please take all your medications with you for your next visit with your Primary MD  Please request your Primary MD to go over all hospital tests and procedure/radiological results at the follow up, please ask your Primary MD to get all Hospital records sent to his/her office.  If you experience worsening of your admission symptoms, develop shortness of breath, life threatening emergency, suicidal or homicidal thoughts you must seek medical attention immediately by calling 911 or calling your MD immediately  if symptoms less severe.  You must read complete instructions/literature along with all the possible adverse reactions/side effects for all the Medicines you take and that have been prescribed to you. Take any new Medicines after you have completely understood and accpet all the possible adverse reactions/side effects.   Do not drive when taking Pain medications.   Do not take more than prescribed Pain, Sleep and Anxiety Medications  Special Instructions: If you have smoked or chewed  Tobacco  in the last 2 yrs please stop smoking, stop any regular Alcohol  and or any Recreational drug use.  Wear Seat belts while driving.  Please note  You were cared for by a hospitalist during your hospital stay. Once you are discharged, your primary care physician will handle any further medical issues. Please note that NO REFILLS for any discharge medications will be authorized once you are discharged, as it is imperative that you return to your primary care physician (or establish a relationship with a primary care physician if you do not have one) for your aftercare needs so that they can reassess your need for medications and monitor your lab values.   Diet recommendation: Diabetic Diet Heart Healthy diet  Discharge Instructions    AMB Referral to Cardiac Rehabilitation - Phase II    Complete by:  As directed   Diagnosis:  Coronary Stents     Amb Referral to Cardiac Rehabilitation    Complete by:  As directed   Diagnosis:   NSTEMI Coronary Stents       Call MD for:  severe uncontrolled pain    Complete by:  As directed      Diet - low sodium heart healthy    Complete by:  As directed      Diet Carb Modified    Complete by:  As directed      Increase activity slowly    Complete by:  As directed            Follow-up Information    Follow up with Azalee Course, PA On 12/28/2015.   Specialties:  Cardiology, Radiology   Why:  9:30AM. Cardiology followup.    Contact information:   [address] [place] [address] [phone]       Follow up with FULP, CAMMIE, MD. Schedule an appointment as soon as possible for a visit in 1 week.   Specialty:  Family Medicine   Why:  Hospital follow up   Contact information:   [address] [place] [address] [phone]      Total Time spent on discharge equals 45 minutes.  SignedJeoffrey Massed 12/21/2015 10:31 AM

## 2015-12-21 NOTE — Progress Notes (Signed)
Patient Name: Sarah GurneyFreddie Mae Larue Date of Encounter: 12/21/2015  Primary Cardiologist: Dr. Delton SeeNelson   Principal Problem:   Chest pain, moderate coronary artery risk Active Problems:   DM2 (diabetes mellitus, type 2) (HCC)   HTN (hypertension)   HLD (hyperlipidemia)   Ischemic chest pain (HCC)    SUBJECTIVE  Denies any CP or SOB this morning, had some SOB overnight, but resolved.   CURRENT MEDS . aspirin EC  81 mg Oral Daily  . atenolol  50 mg Oral BID  . atorvastatin  80 mg Oral q1800  . brinzolamide  1 drop Both Eyes TID  . cholecalciferol  1,000 Units Oral Daily  . losartan  100 mg Oral Daily   And  . hydrochlorothiazide  25 mg Oral Daily  . insulin aspart  0-9 Units Subcutaneous TID WC  . latanoprost  1 drop Both Eyes QHS  . sodium chloride flush  3 mL Intravenous Q12H  . ticagrelor  90 mg Oral BID    OBJECTIVE  Filed Vitals:   12/21/15 0355 12/21/15 0400 12/21/15 0402 12/21/15 0822  BP: 128/39 120/47 120/47 93/20  Pulse:   57 64  Temp:   98.1 F (36.7 C) 98 F (36.7 C)  TempSrc:   Oral Oral  Resp: 15 19 17 16   Height:      Weight:      SpO2:   100% 100%    Intake/Output Summary (Last 24 hours) at 12/21/15 0834 Last data filed at 12/21/15 0823  Gross per 24 hour  Intake 1219.1 ml  Output   1300 ml  Net  -80.9 ml   Filed Weights   12/20/15 1550  Weight: 292 lb (132.45 kg)    PHYSICAL EXAM  General: Pleasant, NAD. Neuro: Alert and oriented X 3. Moves all extremities spontaneously. Psych: Normal affect. HEENT:  Normal  Neck: Supple without bruits or JVD. Lungs:  Resp regular and unlabored, CTA. Heart: RRR no s3, s4, or murmurs. Abdomen: Soft, non-tender, non-distended, BS + x 4.  Extremities: No clubbing, cyanosis or edema. DP/PT/Radials 2+ and equal bilaterally.  Accessory Clinical Findings  CBC  Recent Labs  12/20/15 0155 12/21/15 0400  WBC 5.2 4.7  HGB 11.3* 11.9*  HCT 36.5 35.9*  MCV 88.4 86.9  PLT 199 209   Basic Metabolic  Panel  Recent Labs  16/06/9603/06/17 0155 12/21/15 0400  NA 139 135  K 4.0 4.9  CL 98* 102  CO2 27 21*  GLUCOSE 275* 162*  BUN 17 18  CREATININE 1.48* 1.27*  CALCIUM 9.7 9.3  MG 1.7  --    Cardiac Enzymes  Recent Labs  12/20/15 0504 12/20/15 0753 12/20/15 1551  TROPONINI 0.05* 0.15* 0.41*   Hemoglobin A1C  Recent Labs  12/20/15 1632  HGBA1C 9.1*   Fasting Lipid Panel  Recent Labs  12/21/15 0400  CHOL 150  HDL 46  LDLCALC 89  TRIG 74  CHOLHDL 3.3    TELE NSR without significant ventricular ectopy    ECG  NSR without significant ST-T wave changes  Echocardiogram  pending    Radiology/Studies  Dg Chest 2 View  12/20/2015  CLINICAL DATA:  Mid upper chest pain for 1 week. Shortness of breath. EXAM: CHEST  2 VIEW COMPARISON:  06/20/2015 FINDINGS: The cardiomediastinal contours are unchanged, heart upper limits of normal in size. The lungs are clear. Pulmonary vasculature is normal. No consolidation, pleural effusion, or pneumothorax. No acute osseous abnormalities are seen. IMPRESSION: No acute pulmonary process. Electronically Signed  By: Rubye Oaks M.D.   On: 12/20/2015 03:08    ASSESSMENT AND PLAN  1. NSTEMI  - cath 12/20/2015 40% ost D2, 25% ost LAD, 40% prox LCx, 99% mid LAD treated with DES  - pending echocardiogram today to assess EF. BP low overnight. She is on home dose of atenolol and Hyzaar. Will d/c nifedipine. Hold BP med this morning. Likely discharge today once BP stable. She will need 1 year of ASA and Brilinta. I have sent Rx of Brilinta to his pharmacy to make sure she has 1 year supply.  2. HTN: SBP 90s.   3. HLD: on high dose lipitor  4. DM II: uncontrolled with Hgb A1C 9.1, hold metformin for 48 hours post cath due to interaction with contrast dye  Signed, Amedeo Plenty Pager: [phone]  The patient was seen, examined and discussed with Azalee Course, PA-C and I agree with the above.   A pleasant 80 year old female with prior  medical history of hypertension hyperlipidemia and diabetes, who presented with symptoms suspicious of unstable angina and ruled in for non-STEMI. Cardiac cath showed 99 % stenosis in the mid LAD, s/p PCI with DES, no complications.  Continue ASA, ticagrelor, lipitor, she is quite hypotensive, I will change her meds - d/c atenolol and losartan, Start lisinopril 2.5 mg po daily and carvedilol 3.125 mg po BID.  Echo to be done today, discharge later today.   Lars Masson 12/21/2015

## 2015-12-24 NOTE — Telephone Encounter (Signed)
I left a message with Bradly ChrisRene asking pt call our office.

## 2015-12-24 NOTE — Telephone Encounter (Signed)
I spoke with pt's daughter Bradly ChrisRene. Bradly ChrisRene states pt not at this number, staying with her brother, her brother is deaf, she did not know her sister in law's phone number.

## 2015-12-27 NOTE — Telephone Encounter (Signed)
Daughter answered and states that pt is not with her at this time. Daughter states that pt is aware of her appt tomorrow.

## 2015-12-28 ENCOUNTER — Ambulatory Visit (INDEPENDENT_AMBULATORY_CARE_PROVIDER_SITE_OTHER): Payer: Medicare Other | Admitting: Physician Assistant

## 2015-12-28 ENCOUNTER — Encounter: Payer: Self-pay | Admitting: Physician Assistant

## 2015-12-28 ENCOUNTER — Telehealth: Payer: Self-pay | Admitting: Physician Assistant

## 2015-12-28 VITALS — BP 110/50 | HR 58 | Ht 64.0 in | Wt 301.0 lb

## 2015-12-28 DIAGNOSIS — I10 Essential (primary) hypertension: Secondary | ICD-10-CM

## 2015-12-28 DIAGNOSIS — I214 Non-ST elevation (NSTEMI) myocardial infarction: Secondary | ICD-10-CM | POA: Diagnosis not present

## 2015-12-28 DIAGNOSIS — E785 Hyperlipidemia, unspecified: Secondary | ICD-10-CM | POA: Diagnosis not present

## 2015-12-28 DIAGNOSIS — I2510 Atherosclerotic heart disease of native coronary artery without angina pectoris: Secondary | ICD-10-CM

## 2015-12-28 DIAGNOSIS — I119 Hypertensive heart disease without heart failure: Secondary | ICD-10-CM

## 2015-12-28 LAB — BASIC METABOLIC PANEL
BUN: 37 mg/dL — ABNORMAL HIGH (ref 7–25)
CO2: 27 mmol/L (ref 20–31)
Calcium: 9.5 mg/dL (ref 8.6–10.4)
Chloride: 102 mmol/L (ref 98–110)
Creat: 2.62 mg/dL — ABNORMAL HIGH (ref 0.60–0.93)
Glucose, Bld: 176 mg/dL — ABNORMAL HIGH (ref 65–99)
Potassium: 5 mmol/L (ref 3.5–5.3)
Sodium: 137 mmol/L (ref 135–146)

## 2015-12-28 MED ORDER — LOSARTAN POTASSIUM 25 MG PO TABS: 25.0000 mg | ORAL_TABLET | Freq: Every day | ORAL | Status: DC

## 2015-12-28 MED ORDER — FUROSEMIDE 40 MG PO TABS: 40.0000 mg | ORAL_TABLET | Freq: Every day | ORAL | Status: DC | PRN

## 2015-12-28 NOTE — Progress Notes (Signed)
Cardiology Office Note   Date:  12/28/2015   ID:  Sarah Weaver, DOB 1936/01/18, MRN [id number]  PCP:  Cain Saupe, MD  Cardiologist:  Dr. Delton See  Chief Complaint  Patient presents with  . Hospitalization Follow-up    seen for Dr. Delton See      History of Present Illness: Sarah Weaver is a 80 y.o. female who presents for post hospital follow-up. She has past medical history of morbid obesity, hypertension, hyperlipidemia, DM II however no past cardiac history who presented on 12/20/2015 with chest pain. EKG showed a mild sinus tachycardia, poor R wave progression in anterior leads, minimal ST depression in V6, otherwise no significant ST-T wave changes. Her BNP was normal at 47.7. Initial troponin were minimally elevated however subsequent troponin peaked to trend up. After discussing with the patient, decision was made to take her to the cath lab on the same day of arrival. Cardiac cath performed on 12/20/2015 showed 99% mid LAD lesion treated with Synergy DES 2.5 x 20 mm. Her hemoglobin A1c was elevated at 9.1 suggesting uncontrolled diabetes. Lipids had 12/21/2015 showed cholesterol 150, triglycerides 74, HDL 46, LDL 89. Echocardiogram obtained on 12/21/2015 showed EF 55-60%, no regional wall motion abnormality, grade 1 diastolic dysfunction. Post procedure, patient was placed on aspirin, atenolol, atorvastatin, losartan, HCTZ and Brilinta. She was discharged on 4/7.  Patient presents today for cardiology follow-up. She denies any recurrent chest pain, however she does complain of lower extremity edema, dry cough and shortness of breath even at rest. On physical exam, her lung is clear, I think her shortness breath may be related to Brilinta, however she needs pertinent at this time, usually shortness of breath associated with Brilinta tend to resolve after 1 or 2 weeks. I have asked her to continue Brilinta this time. I think her dry cough may be related to the new lisinopril  that was started, I will is continue lisinopril and the switch her to losartan. As a side note, her blood pressure is low today and the EKG shows sinus bradycardia, however turns out patient has been continuing to take her previous old medication that was clearly discontinued in the hospital including atenolol, losartan/HCTZ, nifedipine, and simvastatin. Given the fact that she is taking medication that can affect her kidney, we will obtain a basic metabolic panel today. As for her lower extremity edema, she only has trace amount of edema, I have asked her to raise her leg at night to treat this conservatively, if that doesn't work, I have also prescribed PRN dose of 40 mg Lasix for lower extremity swelling. She had a lot of medication adjustment today, I will see her back in 2 weeks for blood pressure check and repeat EKG.  It appears this was originally intended to be a seven-day transitional care follow-up, however our staff was unable to reach her within 48 hours of discharge, we left her message to call us back however have not heard since. Therefore I will not bill for 7 day TCM     Past Medical History  Diagnosis Date  . Hyperlipidemia   . Hypertension   . Glaucoma   . Coronary artery disease   . Type II diabetes mellitus (HCC)   . Arthritis     "all over"  . Chronic lower back pain   . Glaucoma of both eyes     Past Surgical History  Procedure Laterality Date  . Tonsillectomy    . Laparoscopic cholecystectomy  04/23/11  .  Coronary angioplasty with stent placement  12/20/2015  . Cataract extraction w/ intraocular lens  implant, bilateral Bilateral   . Cardiac catheterization N/A 12/20/2015    Procedure: Left Heart Cath and Coronary Angiography;  Surgeon: Runell Gess, MD;  Location: [facility] INVASIVE CV LAB;  Service: Cardiovascular;  Laterality: N/A;  . Cardiac catheterization N/A 12/20/2015    Procedure: Coronary Stent Intervention;  Surgeon: Runell Gess, MD;  Location: [facility] INVASIVE CV  LAB;  Service: Cardiovascular;  Laterality: N/A;  mid LAD 2.5 x 20 Synergy     Current Outpatient Prescriptions  Medication Sig Dispense Refill  . ACCU-CHEK SMARTVIEW test strip FOR USE WHEN CHECKING BLOOD SUGARS ONCE DAILY ALTERNATING MORNINGS AND EVENINGS BEFORE MEALS  3  . aspirin EC 81 MG tablet Take 81 mg by mouth daily.    Marland Kitchen atorvastatin (LIPITOR) 80 MG tablet Take 1 tablet (80 mg total) by mouth daily at 6 PM. 90 tablet 0  . brinzolamide (AZOPT) 1 % ophthalmic suspension Place 1 drop into both eyes 3 (three) times daily.     . carvedilol (COREG) 3.125 MG tablet Take 1 tablet (3.125 mg total) by mouth 2 (two) times daily with a meal. 120 tablet 0  . cholecalciferol (VITAMIN D) 1000 UNITS tablet Take 1,000 Units by mouth daily.    Marland Kitchen glimepiride (AMARYL) 1 MG tablet Take 1 mg by mouth daily with breakfast.    . latanoprost (XALATAN) 0.005 % ophthalmic solution Place 1 drop into both eyes at bedtime.     . metFORMIN (GLUCOPHAGE) 1000 MG tablet Take 1,000 mg by mouth daily with breakfast.     . sitaGLIPtin (JANUVIA) 50 MG tablet Take 50 mg by mouth daily.    . ticagrelor (BRILINTA) 90 MG TABS tablet Take 1 tablet (90 mg total) by mouth 2 (two) times daily. 180 tablet 3  . furosemide (LASIX) 40 MG tablet Take 1 tablet (40 mg total) by mouth daily as needed for fluid or edema. 90 tablet 3  . losartan (COZAAR) 25 MG tablet Take 1 tablet (25 mg total) by mouth daily. 90 tablet 3   No current facility-administered medications for this visit.    Allergies:   Review of patient's allergies indicates no known allergies.    Social History:  The patient  reports that she has never smoked. She has never used smokeless tobacco. She reports that she does not drink alcohol or use illicit drugs.   Family History:  The patient's family history includes Cancer in her sister; Heart attack in her mother; Heart disease in her mother and sister; Hypertension in her mother; Stroke in her sister.    ROS:   Please see the history of present illness.   Otherwise, review of systems are positive for cough, SOB.   All other systems are reviewed and negative.    PHYSICAL EXAM: VS:  BP 110/50 mmHg  Pulse 58  Ht 5\' 4"  (1.626 m)  Wt 301 lb (136.533 kg)  BMI 51.64 kg/m2  SpO2 95% , BMI Body mass index is 51.64 kg/(m^2). GEN: Well nourished, well developed, in no acute distress HEENT: normal Neck: no JVD, carotid bruits, or masses Cardiac: RRR; no murmurs, rubs, or gallops,no edema  Respiratory:  clear to auscultation bilaterally, normal work of breathing GI: soft, nontender, nondistended, + BS MS: no deformity or atrophy Skin: warm and dry, no rash Neuro:  Strength and sensation are intact Psych: euthymic mood, full affect   EKG:  EKG is ordered today. The ekg  ordered today demonstrates sinus bradycardia with heart rate 50. Difficult to see T wave and P-wave due to low voltage.   Recent Labs: 12/20/2015: B Natriuretic Peptide 47.7; Magnesium 1.7 12/21/2015: Hemoglobin 11.9*; Platelets 209 12/28/2015: BUN 37*; Creat 2.62*; Potassium 5.0; Sodium 137    Lipid Panel    Component Value Date/Time   CHOL 150 12/21/2015 0400   TRIG 74 12/21/2015 0400   HDL 46 12/21/2015 0400   CHOLHDL 3.3 12/21/2015 0400   VLDL 15 12/21/2015 0400   LDLCALC 89 12/21/2015 0400      Wt Readings from Last 3 Encounters:  12/28/15 301 lb (136.533 kg)  12/20/15 292 lb (132.45 kg)  07/07/11 279 lb (126.554 kg)      Other studies Reviewed: Additional studies/ records that were reviewed today include:   Echo 12/21/2015 LV EF: 55% - 60%  ------------------------------------------------------------------- Indications: CAD of native vessels 414.01.  ------------------------------------------------------------------- History: PMH: Coronary artery disease. Risk factors: Hypertension. Diabetes mellitus. Morbidly obese.  ------------------------------------------------------------------- Study  Conclusions  - Left ventricle: The cavity size was normal. Wall thickness was  increased in a pattern of mild LVH. Systolic function was normal.  The estimated ejection fraction was in the range of 55% to 60%.  Wall motion was normal; there were no regional wall motion  abnormalities. Doppler parameters are consistent with abnormal  left ventricular relaxation (grade 1 diastolic dysfunction). - Mitral valve: Calcified annulus.  Impressions:  - Technically difficult; definity used; normal LV systolic  function; grade 1 diastolic dysfunction.    Cath 12/20/2015 Conclusion     Ost 2nd Diag lesion, 40% stenosed.  Ost LAD to Mid LAD lesion, 25% stenosed.  Prox Cx to Mid Cx lesion, 40% stenosed.  Mid LAD lesion, 99% stenosed. Post intervention, there is a 0% residual stenosis.      Review of the above records demonstrates:   Recently presented with non-ST elevated MI in the hospital and received drug-eluting stent to mid LAD.   ASSESSMENT AND PLAN:  1. CAD  - cath 12/20/2015 99% mid LAD lesion treated with Synergy DES 2.5 x 20 mm.  - Echo 12/21/2015 EF 55-60%, no RWMA, grade 1 DD.  - Stable from cardiac perspective, EKG today shows sinus bradycardia. But it turned out patient has been taking her old medication including the medications prescribed during this admission, therefore she is on both carvedilol and atenolol. I have reviewed each one for her home medication bottles and taken out medications she should not take.  - Continue aspirin and Brilinta, her shortness of breath is probably related to Brilinta, hopefully will better. Check CBC  2. HTN: Blood pressure 100/50 today, again she has been taking multiple medications she should not be on, I have separated those medications out from her medication bag.  - Since her BP is low and if she is taking multiple medications that could affect her renal function, I will check a basic metabolic panel. I did stop her lisinopril  and changed to losartan. Before starting losartan, we will check the renal function.  3. HLD: on lipitor  4. DM: Hgb A1C 9.1, continue followup with PCP   Current medicines are reviewed at length with the patient today.  The patient does not have concerns regarding medicines.  The following changes have been made:  Stop losartan/HCTZ, atenolol, nifedipine, simvastatin. Stop lisinopril start losartan 25 mg. PRN Lasix for additional swelling   Labs/ tests ordered today include:   Orders Placed This Encounter  Procedures  . Basic Metabolic  Panel (BMET)  . EKG 12-Lead     Disposition:   FU with APP in 2 weeks  Ramond Dial, [place]  12/28/2015 11:09 PM    [facility] [address] [place], [place], [place]  [address] Phone: [phone]; Fax: [phone]

## 2015-12-28 NOTE — Patient Instructions (Addendum)
Medication Instructions:  Your physician has recommended you make the following change in your medication:  1.  STOP THE ATENOLOL] 2.  STOP THE LOSARTAN/HCTZ 3.  STOP THE NIFEDIPINE 4.  STOP THE SIMVASTATIN 5.  STOP THE LISINOPRIL 6.  START THE LOSARTAN 25 MG TAKING 1 TABLET DAILY 7.  START THE LASIX 40 MG TAKING 1 TABLET ONLY AS NEEDED (IF ELEVATING THE LEGS DON'T WORK)  Labwork: TODAY:  BMET  Testing/Procedures: None ordered  Follow-Up: Your physician recommends that you schedule a follow-up appointment in: 2 WEEKS WITH HAO MENG, PA-C   Any Other Special Instructions Will Be Listed Below (If Applicable).     If you need a refill on your cardiac medications before your next appointment, please call your pharmacy.

## 2015-12-28 NOTE — Telephone Encounter (Signed)
Pt granddaughter, Harriett Sineancy, per verbal permission by pt, has been made aware of of pts labs and to hold off on starting the losartan and prn lasix and repeat bmet on 01/02/16. They verbalized understanding Order for bmet in epic

## 2015-12-28 NOTE — Telephone Encounter (Signed)
Follow Up   Pt daughter returned the call. Please call ( she states that jennifer called her)

## 2016-01-02 ENCOUNTER — Other Ambulatory Visit (INDEPENDENT_AMBULATORY_CARE_PROVIDER_SITE_OTHER): Payer: Medicare Other

## 2016-01-02 ENCOUNTER — Telehealth: Payer: Self-pay | Admitting: Physician Assistant

## 2016-01-02 DIAGNOSIS — I119 Hypertensive heart disease without heart failure: Secondary | ICD-10-CM

## 2016-01-02 LAB — BASIC METABOLIC PANEL
BUN/Creatinine Ratio: 14 (ref 12–28)
BUN: 21 mg/dL (ref 8–27)
CO2: 24 mmol/L (ref 18–29)
Calcium: 9.4 mg/dL (ref 8.7–10.3)
Chloride: 106 mmol/L (ref 96–106)
Creatinine, Ser: 1.46 mg/dL — ABNORMAL HIGH (ref 0.57–1.00)
GFR calc Af Amer: 39 mL/min/{1.73_m2} — ABNORMAL LOW (ref >59)
GFR calc non Af Amer: 34 mL/min/{1.73_m2} — ABNORMAL LOW (ref >59)
Glucose: 108 mg/dL — ABNORMAL HIGH (ref 65–99)
Potassium: 4.3 mmol/L (ref 3.5–5.2)
Sodium: 140 mmol/L (ref 134–144)

## 2016-01-02 NOTE — Telephone Encounter (Signed)
Error

## 2016-01-02 NOTE — Addendum Note (Signed)
Addended by: Kabria Hetzer J on: 01/02/2016 02:23 PM   Modules accepted: Orders  

## 2016-01-02 NOTE — Addendum Note (Signed)
Addended by: Channing MuttersGRACE, Bosco Paparella J on: 01/02/2016 02:23 PM   Modules accepted: Orders

## 2016-01-10 ENCOUNTER — Encounter: Payer: Self-pay | Admitting: Physician Assistant

## 2016-01-10 ENCOUNTER — Ambulatory Visit (INDEPENDENT_AMBULATORY_CARE_PROVIDER_SITE_OTHER): Payer: Medicare Other | Admitting: Physician Assistant

## 2016-01-10 VITALS — BP 164/82 | HR 94 | Ht 64.0 in | Wt 298.8 lb

## 2016-01-10 DIAGNOSIS — I2510 Atherosclerotic heart disease of native coronary artery without angina pectoris: Secondary | ICD-10-CM

## 2016-01-10 DIAGNOSIS — I10 Essential (primary) hypertension: Secondary | ICD-10-CM

## 2016-01-10 MED ORDER — LOSARTAN POTASSIUM 50 MG PO TABS: 50.0000 mg | ORAL_TABLET | Freq: Every day | ORAL | Status: DC

## 2016-01-10 MED ORDER — CARVEDILOL 12.5 MG PO TABS: 12.5000 mg | ORAL_TABLET | Freq: Two times a day (BID) | ORAL | Status: DC

## 2016-01-10 NOTE — Progress Notes (Signed)
Cardiology Office Note   Date:  01/10/2016   ID:  Sarah Weaver, DOB 04/12/1936, MRN [id number]  PCP:  Cain SaupeFULP, CAMMIE, MD  Cardiologist:  Dr. Delton SeeNelson    Chief Complaint  Patient presents with  . Follow-up    seen for Dr. Delton SeeNelson  . Shortness of Breath    while sitting, with any leisure activity including walking      History of Present Illness: Sarah GurneyFreddie Mae Weaver is a 80 y.o. female who presents for post hospital follow-up. She has past medical history of morbid obesity, hypertension, hyperlipidemia, DM II however no past cardiac history who presented on 12/20/2015 with chest pain. EKG showed a mild sinus tachycardia, poor R wave progression in anterior leads, minimal ST depression in V6, otherwise no significant ST-T wave changes. Her BNP was normal at 47.7. Initial troponin were minimally elevated however subsequent troponin continued to trend up. After discussing with the patient, decision was made to take her to the cath lab on the same day of arrival. Cardiac cath performed on 12/20/2015 showed 99% mid LAD lesion treated with Synergy DES 2.5 x 20 mm. Her hemoglobin A1c was elevated at 9.1 suggesting uncontrolled diabetes. Lipids had 12/21/2015 showed cholesterol 150, triglycerides 74, HDL 46, LDL 89. Echocardiogram obtained on 12/21/2015 showed EF 55-60%, no regional wall motion abnormality, grade 1 diastolic dysfunction. Post procedure, patient was placed on aspirin, atenolol, atorvastatin, losartan, HCTZ and Brilinta. She was discharged on 4/7.  I saw her in the cardiology office on 12/28/2015 she did have some lower extremity pitting edema, I gave her a prescription of PRN Lasix. She also has some degree of shortness of breath, it is unclear whether it is related to Brilinta, however I recommended the patient to continue medications at the time. It also turns out she has been taking multiple medications that was supposed to be discontinued in the hospital including her old atenolol,  losartan HCTZ, nifedipine, and simvastatin along with the new lisinopril. She was complaining of dry cough both during exertion and at rest, I decided to stop the lisinopril and switched to losartan instead. I have also stopped the medication she was not supposed to be on based on d/c summary. We obtained BMET which unfortunately showed a rise of creatinine up to 2.62 which is likely related to combination of polypharmacy that dropped BP and slowed down the HR. I asked her to stop the PRN lasix and losartan, and hydrate herself, we rechecked BMET on 01/02/2016, her creatinine has improved to 1.46. I had instructed her to restart on the losartan and PRN Lasix.  She presents today for follow-up, she is no longer coughing, she still have some degree of shortness breath. He has restarted on the Lasix and has used 2 doses so far, her lower extremity edema has improved significantly after Lasix. Her blood pressure is elevated today, however she has been feeling very well since our last visit.   Past Medical History  Diagnosis Date  . Hyperlipidemia   . Hypertension   . Glaucoma   . Coronary artery disease   . Type II diabetes mellitus (HCC)   . Arthritis     "all over"  . Chronic lower back pain   . Glaucoma of both eyes     Past Surgical History  Procedure Laterality Date  . Tonsillectomy    . Laparoscopic cholecystectomy  04/23/11  . Coronary angioplasty with stent placement  12/20/2015  . Cataract extraction w/ intraocular lens  implant, bilateral  Bilateral   . Cardiac catheterization N/A 12/20/2015    Procedure: Left Heart Cath and Coronary Angiography;  Surgeon: Runell Gess, MD;  Location: [facility] INVASIVE CV LAB;  Service: Cardiovascular;  Laterality: N/A;  . Cardiac catheterization N/A 12/20/2015    Procedure: Coronary Stent Intervention;  Surgeon: Runell Gess, MD;  Location: [facility] INVASIVE CV LAB;  Service: Cardiovascular;  Laterality: N/A;  mid LAD 2.5 x 20 Synergy     Current Outpatient  Prescriptions  Medication Sig Dispense Refill  . ACCU-CHEK SMARTVIEW test strip FOR USE WHEN CHECKING BLOOD SUGARS ONCE DAILY ALTERNATING MORNINGS AND EVENINGS BEFORE MEALS  3  . aspirin EC 81 MG tablet Take 81 mg by mouth daily.    Marland Kitchen atorvastatin (LIPITOR) 80 MG tablet Take 1 tablet (80 mg total) by mouth daily at 6 PM. 90 tablet 0  . brinzolamide (AZOPT) 1 % ophthalmic suspension Place 1 drop into both eyes 3 (three) times daily.     . carvedilol (COREG) 12.5 MG tablet Take 1 tablet (12.5 mg total) by mouth 2 (two) times daily with a meal. 60 tablet 5  . cholecalciferol (VITAMIN D) 1000 UNITS tablet Take 1,000 Units by mouth daily.    . furosemide (LASIX) 40 MG tablet Take 1 tablet (40 mg total) by mouth daily as needed for fluid or edema. 90 tablet 3  . glimepiride (AMARYL) 1 MG tablet Take 1 mg by mouth daily with breakfast.    . latanoprost (XALATAN) 0.005 % ophthalmic solution Place 1 drop into both eyes at bedtime.     Marland Kitchen losartan (COZAAR) 50 MG tablet Take 1 tablet (50 mg total) by mouth daily. 30 tablet 5  . sitaGLIPtin (JANUVIA) 50 MG tablet Take 50 mg by mouth daily.    . ticagrelor (BRILINTA) 90 MG TABS tablet Take 1 tablet (90 mg total) by mouth 2 (two) times daily. 180 tablet 3   No current facility-administered medications for this visit.    Allergies:   Review of patient's allergies indicates no known allergies.    Social History:  The patient  reports that she has never smoked. She has never used smokeless tobacco. She reports that she does not drink alcohol or use illicit drugs.   Family History:  The patient's family history includes Cancer in her sister; Heart attack in her mother; Heart disease in her mother and sister; Hypertension in her mother; Stroke in her sister.    ROS:  Please see the history of present illness.   Otherwise, review of systems are positive for SOB.   All other systems are reviewed and negative.    PHYSICAL EXAM: VS:  BP 164/82 mmHg  Pulse 94   Ht  (1.626 m)  Wt 298 lb 12.8 oz (135.535 kg)  BMI 51.26 kg/m2  SpO2 97% , BMI Body mass index is 51.26 kg/(m^2). GEN: Well nourished, well developed, in no acute distress HEENT: normal Neck: no JVD, carotid bruits, or masses Cardiac: RRR; no murmurs, rubs, or gallops. Trace edema in bilateral LE Respiratory:  clear to auscultation bilaterally, normal work of breathing GI: soft, nontender, nondistended, + BS MS: no deformity or atrophy Skin: warm and dry, no rash Neuro:  Strength and sensation are intact Psych: euthymic mood, full affect   EKG:  EKG is not ordered today.   Recent Labs: 12/20/2015: B Natriuretic Peptide 47.7; Magnesium 1.7 12/21/2015: Hemoglobin 11.9*; Platelets 209 01/02/2016: BUN 21; Creatinine, Ser 1.46*; Potassium 4.3; Sodium 140    Lipid Panel  Component Value Date/Time   CHOL 150 12/21/2015 0400   TRIG 74 12/21/2015 0400   HDL 46 12/21/2015 0400   CHOLHDL 3.3 12/21/2015 0400   VLDL 15 12/21/2015 0400   LDLCALC 89 12/21/2015 0400      Wt Readings from Last 3 Encounters:  01/10/16 298 lb 12.8 oz (135.535 kg)  12/28/15 301 lb (136.533 kg)  12/20/15 292 lb (132.45 kg)      Other studies Reviewed:  Additional studies/ records that were reviewed today include:  Echo 12/21/2015 LV EF: 55% - 60%  ------------------------------------------------------------------- Indications: CAD of native vessels 414.01.  ------------------------------------------------------------------- History: PMH: Coronary artery disease. Risk factors: Hypertension. Diabetes mellitus. Morbidly obese.  ------------------------------------------------------------------- Study Conclusions  - Left ventricle: The cavity size was normal. Wall thickness was  increased in a pattern of mild LVH. Systolic function was normal.  The estimated ejection fraction was in the range of 55% to 60%.  Wall motion was normal; there were no regional wall motion   abnormalities. Doppler parameters are consistent with abnormal  left ventricular relaxation (grade 1 diastolic dysfunction). - Mitral valve: Calcified annulus.  Impressions:  - Technically difficult; definity used; normal LV systolic  function; grade 1 diastolic dysfunction.    Cath 12/20/2015 Conclusion     Ost 2nd Diag lesion, 40% stenosed.  Ost LAD to Mid LAD lesion, 25% stenosed.  Prox Cx to Mid Cx lesion, 40% stenosed.  Mid LAD lesion, 99% stenosed. Post intervention, there is a 0% residual stenosis.            Review of the above records demonstrates:   Patient will underwent recent PCI, noted to have acute kidney injury on last office visit due to polypharmacy, she presents today for follow-up after medication adjustment.   ASSESSMENT AND PLAN:  1. CAD - cath 12/20/2015 99% mid LAD lesion treated with Synergy DES 2.5 x 20 mm. - Echo 12/21/2015 EF 55-60%, no RWMA, grade 1 DD. - Continue aspirin and Brilinta, her shortness of breath is probably related to Brilinta, will continue for now as her symptom is relatively mild.   2. HTN: Her BP is now high, I will increase coreg to 12.5mg  BID and losartan to  daily. Followup with APP in 1 month and Dr. Delton See in 3 month.  3. HLD: on lipitor  4. DM: Hgb A1C 9.1 on last admission, continue followup with PCP  5. Acute on chronic CKD: improved on recent lab, will recheck BMET today.   Current medicines are reviewed at length with the patient today.  The patient does not have concerns regarding medicines.  The following changes have been made:  Increase losartan and coreg dosage  Labs/ tests ordered today include:   Orders Placed This Encounter  Procedures  . Basic metabolic panel     Disposition:   FU with APP in 4 weeks  Signed, Azalee Course, [place]  01/10/2016 5:48 PM    [facility] [address] [place], [place], [place]  [address] Phone: [phone];  Fax: [phone]

## 2016-01-10 NOTE — Patient Instructions (Signed)
Medication Instructions:  Your physician has recommended you make the following change in your medication:  1) INCREASE Carvedilol to 12.5mg  twice daily. An Rx has been sent to your pharmacy 2) INCREASE Losartan to 50mg  daily. An Rx has been sent to your pharmacy   Labwork: Bmet today  Testing/Procedures: None ordered  Follow-Up: Your physician recommends that you schedule a follow-up appointment in: 4 weeks with Azalee CourseHao Meng, PA or an APP  Your physician recommends that you schedule a follow-up appointment in: 3 months with Dr.Nelson     Any Other Special Instructions Will Be Listed Below (If Applicable).     If you need a refill on your cardiac medications before your next appointment, please call your pharmacy.

## 2016-01-18 ENCOUNTER — Other Ambulatory Visit (INDEPENDENT_AMBULATORY_CARE_PROVIDER_SITE_OTHER): Payer: Medicare Other | Admitting: *Deleted

## 2016-01-18 DIAGNOSIS — I10 Essential (primary) hypertension: Secondary | ICD-10-CM

## 2016-01-18 LAB — BASIC METABOLIC PANEL
BUN: 23 mg/dL (ref 7–25)
CO2: 29 mmol/L (ref 20–31)
Calcium: 9.4 mg/dL (ref 8.6–10.4)
Chloride: 103 mmol/L (ref 98–110)
Creat: 1.41 mg/dL — ABNORMAL HIGH (ref 0.60–0.93)
Glucose, Bld: 189 mg/dL — ABNORMAL HIGH (ref 65–99)
Potassium: 4.1 mmol/L (ref 3.5–5.3)
Sodium: 141 mmol/L (ref 135–146)

## 2016-01-18 NOTE — Addendum Note (Signed)
Addended by: Tonita PhoenixBOWDEN, ROBIN K on: 01/18/2016 08:22 AM   Modules accepted: Orders

## 2016-02-06 ENCOUNTER — Encounter: Payer: Self-pay | Admitting: Physician Assistant

## 2016-02-06 ENCOUNTER — Ambulatory Visit (INDEPENDENT_AMBULATORY_CARE_PROVIDER_SITE_OTHER): Payer: Medicare Other | Admitting: Physician Assistant

## 2016-02-06 VITALS — BP 140/78 | HR 68 | Ht 64.0 in | Wt 298.0 lb

## 2016-02-06 DIAGNOSIS — I2510 Atherosclerotic heart disease of native coronary artery without angina pectoris: Secondary | ICD-10-CM | POA: Diagnosis not present

## 2016-02-06 DIAGNOSIS — N183 Chronic kidney disease, stage 3 unspecified: Secondary | ICD-10-CM

## 2016-02-06 DIAGNOSIS — I10 Essential (primary) hypertension: Secondary | ICD-10-CM

## 2016-02-06 DIAGNOSIS — R600 Localized edema: Secondary | ICD-10-CM | POA: Diagnosis not present

## 2016-02-06 MED ORDER — FUROSEMIDE 40 MG PO TABS: 40.0000 mg | ORAL_TABLET | Freq: Every day | ORAL | Status: DC

## 2016-02-06 MED ORDER — CARVEDILOL 25 MG PO TABS: 25.0000 mg | ORAL_TABLET | Freq: Two times a day (BID) | ORAL | Status: DC

## 2016-02-06 NOTE — Patient Instructions (Addendum)
Medication Instructions:   START TAKING COREG 25 MG TWICE A DAY  START TAKING LASIX  ONCE A DAY   If you need a refill on your cardiac medications before your next appointment, please call your pharmacy.  Labwork: NONE ORDER TODAY    Testing/Procedures: NONE ORDER TODAY    Follow-Up: WITH DR Delton SeeNELSON IN 3 MONTHS    Any Other Special Instructions Will Be Listed Below (If Applicable).

## 2016-02-06 NOTE — Progress Notes (Signed)
Cardiology Office Note    Date:  02/06/2016   ID:  Biannca Scantlin, [place] Meo 11-14-1935, MRN [id number]  PCP:  Cain Saupe, MD  Cardiologist:  Dr. Delton See  Chief Complaint  Patient presents with  . Follow-up    seen for Dr. Delton See    History of Present Illness:  Oceania Noori is a 80 y.o. female who presents for follow-up. She has past medical history of morbid obesity, hypertension, hyperlipidemia, DM II however no past cardiac history who presented on 12/20/2015 with chest pain. EKG showed a mild sinus tachycardia, poor R wave progression in anterior leads, minimal ST depression in V6, otherwise no significant ST-T wave changes. Her BNP was normal at 47.7. Initial troponin were minimally elevated however subsequent troponin continued to trend up. After discussing with the patient, decision was made to take her to the cath lab on the same day of arrival. Cardiac cath performed on 12/20/2015 showed 99% mid LAD lesion treated with Synergy DES 2.5 x 20 mm. Her hemoglobin A1c was elevated at 9.1 suggesting uncontrolled diabetes. Lipids had 12/21/2015 showed cholesterol 150, triglycerides 74, HDL 46, LDL 89. Echocardiogram obtained on 12/21/2015 showed EF 55-60%, no regional wall motion abnormality, grade 1 diastolic dysfunction. Post procedure, patient was placed on aspirin, atenolol, atorvastatin, losartan, HCTZ and Brilinta. She was discharged on 4/7.  I saw her in the cardiology office on 12/28/2015 she did have some lower extremity pitting edema, I gave her a prescription of PRN Lasix. She also has some degree of shortness of breath, it is unclear whether it is related to Brilinta, however I recommended the patient to continue medications at the time. It also turns out she has been taking multiple medications that was supposed to be discontinued in the hospital including her old atenolol, losartan HCTZ, nifedipine, and simvastatin along with the new lisinopril. She was complaining of dry cough  both during exertion and at rest, I decided to stop the lisinopril and switched to losartan instead. I have also stopped the medication she was not supposed to be on based on d/c summary. We obtained BMET which unfortunately showed a rise of creatinine up to 2.62 which is likely related to combination of polypharmacy that dropped BP and slowed down the HR. I asked her to stop the PRN lasix and losartan, and hydrate herself, we rechecked BMET on 01/02/2016, her creatinine has improved to 1.46. I had instructed her to restart on the losartan and PRN Lasix.  During her last follow-up on 4/27, her blood pressure was high, I increased to carvedilol to 12.5 mg twice a day. I have instructed her to follow-up in one month. She presents today for follow-up. Instead of PRN daily lasix, she says she is actually taking 40 mg daily Lasix. She continued to have shortness breath and lower extremity edema. On physical exam, she does have 1+ pitting edema. Her blood pressure is 140/78 today, I will continue to increase her Coreg to 25 mg twice a day. Otherwise she has been doing relatively well. She has been seen by Dr. Marisue Humble with nephrology, her baseline creatinines seems to be around 1.4 to 1.5 range. Since she continued to have lower extremity edema and her renal function is stable on daily dose of Lasix, I will continue on the current dose. Although she will need to inform her PCP and Dr. Marisue Humble that she has changed her medication schedule. I will arrange follow-up with Dr. Delton See in 3 month.     Past Medical  History  Diagnosis Date  . Hyperlipidemia   . Hypertension   . Glaucoma   . Coronary artery disease   . Type II diabetes mellitus (HCC)   . Arthritis     "all over"  . Chronic lower back pain   . Glaucoma of both eyes     Past Surgical History  Procedure Laterality Date  . Tonsillectomy    . Laparoscopic cholecystectomy  04/23/11  . Coronary angioplasty with stent placement  12/20/2015  . Cataract  extraction w/ intraocular lens  implant, bilateral Bilateral   . Cardiac catheterization N/A 12/20/2015    Procedure: Left Heart Cath and Coronary Angiography;  Surgeon: Runell Gess, MD;  Location: [facility] INVASIVE CV LAB;  Service: Cardiovascular;  Laterality: N/A;  . Cardiac catheterization N/A 12/20/2015    Procedure: Coronary Stent Intervention;  Surgeon: Runell Gess, MD;  Location: [facility] INVASIVE CV LAB;  Service: Cardiovascular;  Laterality: N/A;  mid LAD 2.5 x 20 Synergy    Current Medications: Outpatient Prescriptions Prior to Visit  Medication Sig Dispense Refill  . ACCU-CHEK SMARTVIEW test strip FOR USE WHEN CHECKING BLOOD SUGARS ONCE DAILY ALTERNATING MORNINGS AND EVENINGS BEFORE MEALS  3  . aspirin EC 81 MG tablet Take 81 mg by mouth daily.    Marland Kitchen atorvastatin (LIPITOR) 80 MG tablet Take 1 tablet (80 mg total) by mouth daily at 6 PM. 90 tablet 0  . brinzolamide (AZOPT) 1 % ophthalmic suspension Place 1 drop into both eyes 3 (three) times daily.     . cholecalciferol (VITAMIN D) 1000 UNITS tablet Take 1,000 Units by mouth daily.    Marland Kitchen latanoprost (XALATAN) 0.005 % ophthalmic solution Place 1 drop into both eyes at bedtime.     Marland Kitchen losartan (COZAAR) 50 MG tablet Take 1 tablet (50 mg total) by mouth daily. 30 tablet 5  . sitaGLIPtin (JANUVIA) 50 MG tablet Take 50 mg by mouth daily.    . ticagrelor (BRILINTA) 90 MG TABS tablet Take 1 tablet (90 mg total) by mouth 2 (two) times daily. 180 tablet 3  . carvedilol (COREG) 12.5 MG tablet Take 1 tablet (12.5 mg total) by mouth 2 (two) times daily with a meal. 60 tablet 5  . furosemide (LASIX) 40 MG tablet Take 1 tablet (40 mg total) by mouth daily as needed for fluid or edema. 90 tablet 3  . glimepiride (AMARYL) 1 MG tablet Take 1 mg by mouth daily with breakfast.     No facility-administered medications prior to visit.     Allergies:   Review of patient's allergies indicates no known allergies.   Social History   Social History  . Marital  Status: Divorced    Spouse Name: N/A  . Number of Children: N/A  . Years of Education: N/A   Social History Main Topics  . Smoking status: Never Smoker   . Smokeless tobacco: Never Used  . Alcohol Use: No  . Drug Use: No  . Sexual Activity: No   Other Topics Concern  . None   Social History Narrative     Family History:  The patient's family history includes Cancer in her sister; Heart attack in her mother; Heart disease in her mother and sister; Hypertension in her mother; Stroke in her sister.   ROS:   Please see the history of present illness.    ROS All other systems reviewed and are negative.   PHYSICAL EXAM:   VS:  BP 140/78 mmHg  Pulse 68  Ht 5'  4" (1.626 m)  Wt 298 lb (135.172 kg)  BMI 51.13 kg/m2   GEN: Well nourished, well developed, in no acute distress HEENT: normal Neck: no JVD, carotid bruits, or masses Cardiac: RRR; no murmurs, rubs, or gallops. 1+ pitting edema Respiratory:  clear to auscultation bilaterally, normal work of breathing GI: soft, nontender, nondistended, + BS MS: no deformity or atrophy Skin: warm and dry, no rash Neuro:  Alert and Oriented x 3, Strength and sensation are intact Psych: euthymic mood, full affect  Wt Readings from Last 3 Encounters:  02/06/16 298 lb (135.172 kg)  01/10/16 298 lb 12.8 oz (135.535 kg)  12/28/15 301 lb (136.533 kg)      Studies/Labs Reviewed:   EKG:  EKG is not ordered today.    Recent Labs: 12/20/2015: B Natriuretic Peptide 47.7; Magnesium 1.7 12/21/2015: Hemoglobin 11.9*; Platelets 209 01/18/2016: BUN 23; Creat 1.41*; Potassium 4.1; Sodium 141   Lipid Panel    Component Value Date/Time   CHOL 150 12/21/2015 0400   TRIG 74 12/21/2015 0400   HDL 46 12/21/2015 0400   CHOLHDL 3.3 12/21/2015 0400   VLDL 15 12/21/2015 0400   LDLCALC 89 12/21/2015 0400    Additional studies/ records that were reviewed today include:   Cardiac cath 12/20/2015 Conclusion     Ost 2nd Diag lesion, 40%  stenosed.  Ost LAD to Mid LAD lesion, 25% stenosed.  Prox Cx to Mid Cx lesion, 40% stenosed.  Mid LAD lesion, 99% stenosed. Post intervention, there is a 0% residual stenosis.     Echo 12/21/2015 LV EF: 55% - 60%  ------------------------------------------------------------------- Indications: CAD of native vessels 414.01.  ------------------------------------------------------------------- History: PMH: Coronary artery disease. Risk factors: Hypertension. Diabetes mellitus. Morbidly obese.  ------------------------------------------------------------------- Study Conclusions  - Left ventricle: The cavity size was normal. Wall thickness was  increased in a pattern of mild LVH. Systolic function was normal.  The estimated ejection fraction was in the range of 55% to 60%.  Wall motion was normal; there were no regional wall motion  abnormalities. Doppler parameters are consistent with abnormal  left ventricular relaxation (grade 1 diastolic dysfunction). - Mitral valve: Calcified annulus.  Impressions:  - Technically difficult; definity used; normal LV systolic  function; grade 1 diastolic dysfunction.    ASSESSMENT:    1. Coronary artery disease involving native coronary artery of native heart without angina pectoris   2. Essential hypertension   3. CKD (chronic kidney disease), stage III   4. Bilateral edema of lower extremity      PLAN:  In order of problems listed above:  1. CAD - cath 12/20/2015 99% mid LAD lesion treated with Synergy DES 2.5 x 20 mm. - Echo 12/21/2015 EF 55-60%, no RWMA, grade 1 DD. - Continue aspirin and Brilinta, her shortness of breath is probably related to Brilinta, will continue for now as her symptom is relatively mild.   2. HTN: I increased her Coreg to 12.5 mg twice a day during the last visit and losartan to 50 mg daily. Her blood pressure today is 140/78, will increase Coreg  to 25 mg twice a day.  3. HLD: on lipitor  4. DM: Hgb A1C 9.1 on last admission, continue followup with PCP  5. Acute on chronic CKD: seen by Dr. Marisue HumbleSanford, recent labs stable  6. LE edema: She continued to have 1+ pitting edema in bilateral lower extremity, is still taking when necessary dose of Lasix, she is actually taking 40 mg Lasix daily, despite daily dosing of Lasix,  it appears her renal function is quite stable. I am fine with her continue on the current dose. If she does have increased her lower extremity edema, she has been instructed to take additional dose of Lasix once daily if needed.    Medication Adjustments/Labs and Tests Ordered: Current medicines are reviewed at length with the patient today.  Concerns regarding medicines are outlined above.  Medication changes, Labs and Tests ordered today are listed in the Patient Instructions below. Patient Instructions  Medication Instructions:   START TAKING COREG 25 MG TWICE A DAY  START TAKING LASIX  ONCE A DAY   If you need a refill on your cardiac medications before your next appointment, please call your pharmacy.  Labwork: NONE ORDER TODAY    Testing/Procedures: NONE ORDER TODAY    Follow-Up: WITH DR Delton See IN 3 MONTHS    Any Other Special Instructions Will Be Listed Below (If Applicable).                                                                                                                                                       Ramond Dial, [place]  02/06/2016 5:21 PM    [facility] [address] [place], [place], [place]  [address] Phone: [phone]; Fax: [phone]

## 2016-02-13 ENCOUNTER — Encounter: Payer: Self-pay | Admitting: Physician Assistant

## 2016-04-04 ENCOUNTER — Ambulatory Visit: Payer: Medicare Other | Admitting: Cardiology

## 2016-05-02 ENCOUNTER — Encounter: Payer: Self-pay | Admitting: Cardiology

## 2016-05-16 ENCOUNTER — Encounter: Payer: Self-pay | Admitting: Cardiology

## 2016-05-16 ENCOUNTER — Ambulatory Visit (INDEPENDENT_AMBULATORY_CARE_PROVIDER_SITE_OTHER): Payer: Medicare Other | Admitting: Cardiology

## 2016-05-16 VITALS — BP 130/82 | HR 67 | Ht 64.0 in | Wt 302.0 lb

## 2016-05-16 DIAGNOSIS — E785 Hyperlipidemia, unspecified: Secondary | ICD-10-CM

## 2016-05-16 DIAGNOSIS — I2510 Atherosclerotic heart disease of native coronary artery without angina pectoris: Secondary | ICD-10-CM

## 2016-05-16 DIAGNOSIS — I10 Essential (primary) hypertension: Secondary | ICD-10-CM | POA: Diagnosis not present

## 2016-05-16 DIAGNOSIS — I5033 Acute on chronic diastolic (congestive) heart failure: Secondary | ICD-10-CM

## 2016-05-16 DIAGNOSIS — I119 Hypertensive heart disease without heart failure: Secondary | ICD-10-CM | POA: Diagnosis not present

## 2016-05-16 NOTE — Progress Notes (Signed)
Cardiology Office Note    Date:  05/16/2016   ID:  Sarah Weaver, DOB Aug 15, 1936, MRN [id number]  PCP:  Cain Saupe, MD  Cardiologist:  Dr. Delton See  No chief complaint on file.   History of Present Illness:  Sarah Weaver is a 80 y.o. female who presents for follow-up. She has past medical history of morbid obesity, hypertension, hyperlipidemia, DM II however no past cardiac history who presented on 12/20/2015 with chest pain. EKG showed a mild sinus tachycardia, poor R wave progression in anterior leads, minimal ST depression in V6, otherwise no significant ST-T wave changes. Her BNP was normal at 47.7. Initial troponin were minimally elevated however subsequent troponin continued to trend up. After discussing with the patient, decision was made to take her to the cath lab on the same day of arrival. Cardiac cath performed on 12/20/2015 showed 99% mid LAD lesion treated with Synergy DES 2.5 x 20 mm. Her hemoglobin A1c was elevated at 9.1 suggesting uncontrolled diabetes. Lipids had 12/21/2015 showed cholesterol 150, triglycerides 74, HDL 46, LDL 89. Echocardiogram obtained on 12/21/2015 showed EF 55-60%, no regional wall motion abnormality, grade 1 diastolic dysfunction. Post procedure, patient was placed on aspirin, atenolol, atorvastatin, losartan, HCTZ and Brilinta. She was discharged on 4/7.  I saw her in the cardiology office on 12/28/2015 she did have some lower extremity pitting edema, I gave her a prescription of PRN Lasix. She also has some degree of shortness of breath, it is unclear whether it is related to Brilinta, however I recommended the patient to continue medications at the time. It also turns out she has been taking multiple medications that was supposed to be discontinued in the hospital including her old atenolol, losartan HCTZ, nifedipine, and simvastatin along with the new lisinopril. She was complaining of dry cough both during exertion and at rest, I decided to stop  the lisinopril and switched to losartan instead. I have also stopped the medication she was not supposed to be on based on d/c summary. We obtained BMET which unfortunately showed a rise of creatinine up to 2.62 which is likely related to combination of polypharmacy that dropped BP and slowed down the HR. I asked her to stop the PRN lasix and losartan, and hydrate herself, we rechecked BMET on 01/02/2016, her creatinine has improved to 1.46. I had instructed her to restart on the losartan and PRN Lasix.  During her last follow-up on 4/27, her blood pressure was high, I increased to carvedilol to 12.5 mg twice a day. I have instructed her to follow-up in one month. She presents today for follow-up. Instead of PRN daily lasix, she says she is actually taking 40 mg daily Lasix. She continued to have shortness breath and lower extremity edema. On physical exam, she does have 1+ pitting edema. Her blood pressure is 140/78 today, I will continue to increase her Coreg to 25 mg twice a day. Otherwise she has been doing relatively well. She has been seen by Dr. Marisue Humble with nephrology, her baseline creatinines seems to be around 1.4 to 1.5 range. Since she continued to have lower extremity edema and her renal function is stable on daily dose of Lasix, I will continue on the current dose. Although she will need to inform her PCP and Dr. Marisue Humble that she has changed her medication schedule. I will arrange follow-up with Dr. Delton See in 3 month.   05/16/2016 - 3 months follow up, she is accompanied by her granddaughter. She overall feels better,  complaint to her meds but stopped using lasix as she felt that it makes her SOB. Her LE edema has resolved, she has occasional PND, no chest pain, no palpitations or syncope. NO side effects from atorvastatin. No bleeding on DAPT.     Past Medical History:  Diagnosis Date  . Arthritis    "all over"  . Chronic lower back pain   . Coronary artery disease   . Glaucoma   . Glaucoma  of both eyes   . Hyperlipidemia   . Hypertension   . Type II diabetes mellitus (HCC)     Past Surgical History:  Procedure Laterality Date  . CARDIAC CATHETERIZATION N/A 12/20/2015   Procedure: Left Heart Cath and Coronary Angiography;  Surgeon: Runell GessJonathan J Berry, MD;  [facility];  Service: Cardiovascular;  Laterality: N/A;  . CARDIAC CATHETERIZATION N/A 12/20/2015   Procedure: Coronary Stent Intervention;  Surgeon: Runell GessJonathan J Berry, MD;  Location: [facility] INVASIVE CV LAB;  Service: Cardiovascular;  Laterality: N/A;  mid LAD 2.5 x 20 Synergy  . CATARACT EXTRACTION W/ INTRAOCULAR LENS  IMPLANT, BILATERAL Bilateral   . CORONARY ANGIOPLASTY WITH STENT PLACEMENT  12/20/2015  . LAPAROSCOPIC CHOLECYSTECTOMY  04/23/11  . TONSILLECTOMY      Current Medications: Outpatient Medications Prior to Visit  Medication Sig Dispense Refill  . ACCU-CHEK SMARTVIEW test strip FOR USE WHEN CHECKING BLOOD SUGARS ONCE DAILY ALTERNATING MORNINGS AND EVENINGS BEFORE MEALS  3  . aspirin EC 81 MG tablet Take 81 mg by mouth daily.    Marland Kitchen. atorvastatin (LIPITOR) 80 MG tablet Take 1 tablet (80 mg total) by mouth daily at 6 PM. 90 tablet 0  . brinzolamide (AZOPT) 1 % ophthalmic suspension Place 1 drop into both eyes 3 (three) times daily.     . carvedilol (COREG) 25 MG tablet Take 1 tablet (25 mg total) by mouth 2 (two) times daily with a meal. 60 tablet 3  . furosemide (LASIX) 40 MG tablet Take 1 tablet (40 mg total) by mouth daily. 90 tablet 3  . glimepiride (AMARYL) 2 MG tablet     . latanoprost (XALATAN) 0.005 % ophthalmic solution Place 1 drop into both eyes at bedtime.     Marland Kitchen. losartan (COZAAR) 50 MG tablet Take 1 tablet (50 mg total) by mouth daily. 30 tablet 5  . oxyCODONE-acetaminophen (PERCOCET/ROXICET) 5-325 MG tablet Take by mouth every 6 (six) hours as needed for severe pain.    . sitaGLIPtin (JANUVIA) 50 MG tablet Take 50 mg by mouth daily.    . ticagrelor (BRILINTA) 90 MG TABS tablet Take 1 tablet (90  mg total) by mouth 2 (two) times daily. 180 tablet 3  . cholecalciferol (VITAMIN D) 1000 UNITS tablet Take 1,000 Units by mouth daily.     No facility-administered medications prior to visit.      Allergies:   Review of patient's allergies indicates no known allergies.   Social History   Social History  . Marital status: Divorced    Spouse name: N/A  . Number of children: N/A  . Years of education: N/A   Social History Main Topics  . Smoking status: Never Smoker  . Smokeless tobacco: Never Used  . Alcohol use No  . Drug use: No  . Sexual activity: No   Other Topics Concern  . None   Social History Narrative  . None     Family History:  The patient's family history includes Cancer in her sister; Heart attack in her mother;  Heart disease in her mother and sister; Hypertension in her mother; Stroke in her sister.   ROS:   Please see the history of present illness.    ROS All other systems reviewed and are negative.   PHYSICAL EXAM:   VS:  BP 130/82   Pulse 67   Ht 5\' 4"  (1.626 m)   Wt (!) 302 lb (137 kg)   SpO2 98%   BMI 51.84 kg/m    GEN: Well nourished, well developed, in no acute distress  HEENT: normal  Neck: no JVD, carotid bruits, or masses Cardiac: RRR; no murmurs, rubs, or gallops. 1+ pitting edema Respiratory:  clear to auscultation bilaterally, normal work of breathing GI: soft, nontender, nondistended, + BS MS: no deformity or atrophy  Skin: warm and dry, no rash Neuro:  Alert and Oriented x 3, Strength and sensation are intact Psych: euthymic mood, full affect  Wt Readings from Last 3 Encounters:  05/16/16 (!) 302 lb (137 kg)  02/06/16 298 lb (135.2 kg)  01/10/16 298 lb 12.8 oz (135.5 kg)      Studies/Labs Reviewed:   EKG:  EKG is not ordered today.    Recent Labs: 12/20/2015: B Natriuretic Peptide 47.7; Magnesium 1.7 12/21/2015: Hemoglobin 11.9; Platelets 209 01/18/2016: BUN 23; Creat 1.41; Potassium 4.1; Sodium 141   Lipid Panel      Component Value Date/Time   CHOL 150 12/21/2015 0400   TRIG 74 12/21/2015 0400   HDL 46 12/21/2015 0400   CHOLHDL 3.3 12/21/2015 0400   VLDL 15 12/21/2015 0400   LDLCALC 89 12/21/2015 0400    Additional studies/ records that were reviewed today include:   Cardiac cath 12/20/2015 Conclusion     Ost 2nd Diag lesion, 40% stenosed.  Ost LAD to Mid LAD lesion, 25% stenosed.  Prox Cx to Mid Cx lesion, 40% stenosed.  Mid LAD lesion, 99% stenosed. Post intervention, there is a 0% residual stenosis.     Echo 12/21/2015 LV EF: 55% - 60%  ------------------------------------------------------------------- Indications: CAD of native vessels 414.01.  ------------------------------------------------------------------- History: PMH: Coronary artery disease. Risk factors: Hypertension. Diabetes mellitus. Morbidly obese.  ------------------------------------------------------------------- Study Conclusions  - Left ventricle: The cavity size was normal. Wall thickness was  increased in a pattern of mild LVH. Systolic function was normal.  The estimated ejection fraction was in the range of 55% to 60%.  Wall motion was normal; there were no regional wall motion  abnormalities. Doppler parameters are consistent with abnormal  left ventricular relaxation (grade 1 diastolic dysfunction). - Mitral valve: Calcified annulus.  Impressions:  - Technically difficult; definity used; normal LV systolic  function; grade 1 diastolic dysfunction.    ASSESSMENT:    1. Essential hypertension   2. Coronary artery disease involving native coronary artery of native heart without angina pectoris   3. HLD (hyperlipidemia)   4. Hypertensive heart disease without heart failure   5. Acute on chronic diastolic CHF (congestive heart failure) (HCC)      PLAN:  In order of problems listed above:  1. CAD - cath 12/20/2015 99% mid LAD lesion treated with Synergy DES  2.5 x 20 mm. - Echo 12/21/2015 EF 55-60%, no RWMA, grade 1 DD. - Continue aspirin and Brilinta, her shortness of breath is probably related to Brilinta, will continue for now as her symptom is relatively mild.   2. ZOX:[id number] after increasing carvedilol   3. HLD: on lipitor, well tolerated  4. DM: Hgb A1C 9.1 on last admission, continue followup with PCP  5. Acute on chronic CKD: lasix 40 mg po daily PRN   Medication Adjustments/Labs and Tests Ordered: Current medicines are reviewed at length with the patient today.  Concerns regarding medicines are outlined above.  Medication changes, Labs and Tests ordered today are listed in the Patient Instructions below. Patient Instructions    Medication Instructions:   Your physician recommends that you continue on your current medications as directed. Please refer to the Current Medication list given to you today.   Labwork:  PRIOR TOO YOUR 3 MONTH FOLLOW-UP APPOINTMENT WITH DR Delton See TO CHECK A ---CMET AND CBC W DIFF    Follow-Up:  3 MONTHS WITH DR Rayshawn Visconti--PLEASE HAVE YOUR LABS PRIOR TO THIS OFFICE VISIT      If you need a refill on your cardiac medications before your next appointment, please call your pharmacy.      Signed, Tobias Alexander, MD  05/16/2016 7:41 PM    [facility] [address] [place], [place], [place]  [address] Phone: [phone]; Fax: [phone]

## 2016-05-16 NOTE — Patient Instructions (Addendum)
   Medication Instructions:   Your physician recommends that you continue on your current medications as directed. Please refer to the Current Medication list given to you today.   Labwork:  PRIOR TOO YOUR 3 MONTH FOLLOW-UP APPOINTMENT WITH DR Delton SeeNELSON TO CHECK A ---CMET AND CBC W DIFF    Follow-Up:  3 MONTHS WITH DR NELSON--PLEASE HAVE YOUR LABS PRIOR TO THIS OFFICE VISIT      If you need a refill on your cardiac medications before your next appointment, please call your pharmacy.

## 2016-06-06 ENCOUNTER — Other Ambulatory Visit: Payer: Self-pay | Admitting: Cardiology

## 2016-06-06 DIAGNOSIS — I2510 Atherosclerotic heart disease of native coronary artery without angina pectoris: Secondary | ICD-10-CM

## 2016-06-06 DIAGNOSIS — I10 Essential (primary) hypertension: Secondary | ICD-10-CM

## 2016-06-18 ENCOUNTER — Other Ambulatory Visit: Payer: Self-pay | Admitting: Cardiology

## 2016-08-06 ENCOUNTER — Encounter: Payer: Self-pay | Admitting: Cardiology

## 2016-08-15 ENCOUNTER — Ambulatory Visit (INDEPENDENT_AMBULATORY_CARE_PROVIDER_SITE_OTHER): Payer: Medicare Other | Admitting: Cardiology

## 2016-08-15 ENCOUNTER — Encounter: Payer: Self-pay | Admitting: Cardiology

## 2016-08-15 VITALS — BP 128/74 | HR 80 | Ht 64.0 in | Wt 298.0 lb

## 2016-08-15 DIAGNOSIS — E782 Mixed hyperlipidemia: Secondary | ICD-10-CM | POA: Diagnosis not present

## 2016-08-15 DIAGNOSIS — E785 Hyperlipidemia, unspecified: Secondary | ICD-10-CM

## 2016-08-15 DIAGNOSIS — I2510 Atherosclerotic heart disease of native coronary artery without angina pectoris: Secondary | ICD-10-CM

## 2016-08-15 DIAGNOSIS — I119 Hypertensive heart disease without heart failure: Secondary | ICD-10-CM | POA: Diagnosis not present

## 2016-08-15 NOTE — Progress Notes (Signed)
Cardiology Office Note    Date:  08/15/2016   ID:  [name], DOB 01/19/1936, MRN [id number]  PCP:  Sarah Saupe, MD  Cardiologist:  Dr. Delton Weaver  No chief complaint on file.   History of Present Illness:  Sarah Weaver is a 80 y.o. female who presents for follow-up. She has past medical history of morbid obesity, hypertension, hyperlipidemia, DM II however no past cardiac history who presented on 12/20/2015 with chest pain. EKG showed a mild sinus tachycardia, poor R wave progression in anterior leads, minimal ST depression in V6, otherwise no significant ST-T wave changes. Her BNP was normal at 47.7. Initial troponin were minimally elevated however subsequent troponin continued to trend up. After discussing with the patient, decision was made to take her to the cath lab on the same day of arrival. Cardiac cath performed on 12/20/2015 showed 99% mid LAD lesion treated with Synergy DES 2.5 x 20 mm. Her hemoglobin A1c was elevated at 9.1 suggesting uncontrolled diabetes. Lipids had 12/21/2015 showed cholesterol 150, triglycerides 74, HDL 46, LDL 89. Echocardiogram obtained on 12/21/2015 showed EF 55-60%, no regional wall motion abnormality, grade 1 diastolic dysfunction. Post procedure, patient was placed on aspirin, atenolol, atorvastatin, losartan, HCTZ and Brilinta. She was discharged on 4/7.  I saw her in the cardiology office on 12/28/2015 she did have some lower extremity pitting edema, I gave her a prescription of PRN Lasix. She also has some degree of shortness of breath, it is unclear whether it is related to Brilinta, however I recommended the patient to continue medications at the time. It also turns out she has been taking multiple medications that was supposed to be discontinued in the hospital including her old atenolol, losartan HCTZ, nifedipine, and simvastatin along with the new lisinopril. She was complaining of dry cough both during exertion and at rest, I decided to  stop the lisinopril and switched to losartan instead. I have also stopped the medication she was not supposed to be on based on d/c summary. We obtained BMET which unfortunately showed a rise of creatinine up to 2.62 which is likely related to combination of polypharmacy that dropped BP and slowed down the HR. I asked her to stop the PRN lasix and losartan, and hydrate herself, we rechecked BMET on 01/02/2016, her creatinine has improved to 1.46. I had instructed her to restart on the losartan and PRN Lasix.  During her last follow-up on 4/27, her blood pressure was high, I increased to carvedilol to 12.5 mg twice a day. I have instructed her to follow-up in one month. She presents today for follow-up. Instead of PRN daily lasix, she says she is actually taking 40 mg daily Lasix. She continued to have shortness breath and lower extremity edema. On physical exam, she does have 1+ pitting edema. Her blood pressure is 140/78 today, I will continue to increase her Coreg to 25 mg twice a day. Otherwise she has been doing relatively well. She has been seen by Dr. Marisue Weaver with nephrology, her baseline creatinines seems to be around 1.4 to 1.5 range. Since she continued to have lower extremity edema and her renal function is stable on daily dose of Lasix, I will continue on the current dose. Although she will need to inform her PCP and Dr. Marisue Weaver that she has changed her medication schedule. I will arrange follow-up with Dr. Delton Weaver in 3 month.   05/16/2016 - 3 months follow up, she is accompanied by her granddaughter. She overall feels better,  complaint to her meds but stopped using lasix as she felt that it makes her SOB. Her LE edema has resolved, she has occasional PND, no chest pain, no palpitations or syncope. NO side effects from atorvastatin. No bleeding on DAPT.    08/15/2016 - the patient is coming after 3 months, she looks good and feels good. She denies any lower extremity edema orthopnea or proximal  nocturnal dyspnea. She denies any exertional chest pain but states that lately she has been spending a lot of time with her great-granddaughter that wants to be picked up and she feels soreness in her chest when she does that. No palpitations or syncope. She has been compliant with her meds and has no muscle pain with atorvastatin. She is scheduled to Weaver a nephrologist on December 12 who will draw her labs.   Past Medical History:  Diagnosis Date  . Arthritis    "all over"  . Chronic lower back pain   . Coronary artery disease   . Glaucoma   . Glaucoma of both eyes   . Hyperlipidemia   . Hypertension   . Type II diabetes mellitus (HCC)     Past Surgical History:  Procedure Laterality Date  . CARDIAC CATHETERIZATION N/A 12/20/2015   Procedure: Left Heart Cath and Coronary Angiography;  Surgeon: Runell GessJonathan J Berry, MD;  [facility];  Service: Cardiovascular;  Laterality: N/A;  . CARDIAC CATHETERIZATION N/A 12/20/2015   Procedure: Coronary Stent Intervention;  Surgeon: Runell GessJonathan J Berry, MD;  Location: [facility] INVASIVE CV LAB;  Service: Cardiovascular;  Laterality: N/A;  mid LAD 2.5 x 20 Synergy  . CATARACT EXTRACTION W/ INTRAOCULAR LENS  IMPLANT, BILATERAL Bilateral   . CORONARY ANGIOPLASTY WITH STENT PLACEMENT  12/20/2015  . LAPAROSCOPIC CHOLECYSTECTOMY  04/23/11  . TONSILLECTOMY      Current Medications: Outpatient Medications Prior to Visit  Medication Sig Dispense Refill  . ACCU-CHEK SMARTVIEW test strip FOR USE WHEN CHECKING BLOOD SUGARS ONCE DAILY ALTERNATING MORNINGS AND EVENINGS BEFORE MEALS  3  . aspirin EC 81 MG tablet Take 81 mg by mouth daily.    Marland Kitchen. atorvastatin (LIPITOR) 80 MG tablet TAKE 1 TABLET BY MOUTH DAILY AT 6PM 90 tablet 3  . brinzolamide (AZOPT) 1 % ophthalmic suspension Place 1 drop into both eyes 3 (three) times daily.     . carvedilol (COREG) 25 MG tablet TAKE 1 TABLET (25 MG TOTAL) BY MOUTH 2 (TWO) TIMES DAILY WITH A MEAL. 60 tablet 11  . Cholecalciferol  (NATURAL VITAMIN D-3) 5000 units TABS Take 1 tablet by mouth daily.    . furosemide (LASIX) 40 MG tablet Take 1 tablet (40 mg total) by mouth daily. 90 tablet 3  . glimepiride (AMARYL) 2 MG tablet Take 2 mg by mouth.     . latanoprost (XALATAN) 0.005 % ophthalmic solution Place 1 drop into both eyes at bedtime.     Marland Kitchen. losartan (COZAAR) 50 MG tablet Take 1 tablet (50 mg total) by mouth daily. 30 tablet 5  . oxyCODONE-acetaminophen (PERCOCET/ROXICET) 5-325 MG tablet Take by mouth every 6 (six) hours as needed for severe pain.    . sitaGLIPtin (JANUVIA) 50 MG tablet Take 50 mg by mouth daily.    . ticagrelor (BRILINTA) 90 MG TABS tablet Take 1 tablet (90 mg total) by mouth 2 (two) times daily. 180 tablet 3   No facility-administered medications prior to visit.      Allergies:   Patient has no known allergies.   Social History  Social History  . Marital status: Divorced    Spouse name: N/A  . Number of children: N/A  . Years of education: N/A   Social History Main Topics  . Smoking status: Never Smoker  . Smokeless tobacco: Never Used  . Alcohol use No  . Drug use: No  . Sexual activity: No   Other Topics Concern  . None   Social History Narrative  . None     Family History:  The patient's family history includes Cancer in her sister; Heart attack in her mother; Heart disease in her mother and sister; Hypertension in her mother; Stroke in her sister.   ROS:   Please Weaver the history of present illness.    ROS All other systems reviewed and are negative.   PHYSICAL EXAM:   VS:  BP 128/74   Pulse 80   Ht 5\' 4"  (1.626 m)   Wt 298 lb (135.2 kg)   SpO2 97%   BMI 51.15 kg/m    GEN: Well nourished, well developed, in no acute distress  HEENT: normal  Neck: no JVD, carotid bruits, or masses Cardiac: RRR; no murmurs, rubs, or gallops. 1+ pitting edema Respiratory:  clear to auscultation bilaterally, normal work of breathing GI: soft, nontender, nondistended, + BS MS: no  deformity or atrophy  Skin: warm and dry, no rash Neuro:  Alert and Oriented x 3, Strength and sensation are intact Psych: euthymic mood, full affect  Wt Readings from Last 3 Encounters:  08/15/16 298 lb (135.2 kg)  05/16/16 (!) 302 lb (137 kg)  02/06/16 298 lb (135.2 kg)      Studies/Labs Reviewed:   EKG:  EKG is not ordered today.    Recent Labs: 12/20/2015: B Natriuretic Peptide 47.7; Magnesium 1.7 12/21/2015: Hemoglobin 11.9; Platelets 209 01/18/2016: BUN 23; Creat 1.41; Potassium 4.1; Sodium 141   Lipid Panel    Component Value Date/Time   CHOL 150 12/21/2015 0400   TRIG 74 12/21/2015 0400   HDL 46 12/21/2015 0400   CHOLHDL 3.3 12/21/2015 0400   VLDL 15 12/21/2015 0400   LDLCALC 89 12/21/2015 0400    Additional studies/ records that were reviewed today include:   Cardiac cath 12/20/2015 Conclusion     Ost 2nd Diag lesion, 40% stenosed.  Ost LAD to Mid LAD lesion, 25% stenosed.  Prox Cx to Mid Cx lesion, 40% stenosed.  Mid LAD lesion, 99% stenosed. Post intervention, there is a 0% residual stenosis.     Echo 12/21/2015 LV EF: 55% - 60%  ------------------------------------------------------------------- Indications: CAD of native vessels 414.01.  ------------------------------------------------------------------- History: PMH: Coronary artery disease. Risk factors: Hypertension. Diabetes mellitus. Morbidly obese.  ------------------------------------------------------------------- Study Conclusions  - Left ventricle: The cavity size was normal. Wall thickness was  increased in a pattern of mild LVH. Systolic function was normal.  The estimated ejection fraction was in the range of 55% to 60%.  Wall motion was normal; there were no regional wall motion  abnormalities. Doppler parameters are consistent with abnormal  left ventricular relaxation (grade 1 diastolic dysfunction). - Mitral valve: Calcified annulus.  Impressions:  -  Technically difficult; definity used; normal LV systolic  function; grade 1 diastolic dysfunction.    ASSESSMENT:    No diagnosis found.   PLAN:  In order of problems listed above:  1. CAD - cath 12/20/2015 99% mid LAD lesion treated with Synergy DES 2.5 x 20 mm. - Echo 12/21/2015 EF 55-60%, no RWMA, grade 1 DD. - Continue aspirin and Brilinta, her  shortness of breath Has improved   - chest pain score scatter secondary to lifting up her granddaughter, EKG today shows new negative T waves in inferolateral leads, however patient states that she feels so much better than before getting her stent back in April 2017 and she would rather not do stress testing right now.  2. HTN: Now controlled.  3. HLD: on lipitor, well tolerated  4. DM: Hgb A1C 9.1 on last admission, continue followup with PCP  5. Acute on chronic CKD: lasix 40 mg po daily PRN, she is euvolemic today, her most recent creatinine 1.4 from 2.6, she is seeing a nephrologis later this month we'll check her blood work. t  Follow-up in 6 months.  Medication Adjustments/Labs and Tests Ordered: Current medicines are reviewed at length with the patient today.  Concerns regarding medicines are outlined above.  Medication changes, Labs and Tests ordered today are listed in the Patient Instructions below. There are no Patient Instructions on file for this visit.   Signed, Tobias Alexander, MD  08/15/2016 8:35 AM    [facility] [address] [place], [place], [place]  [address] Phone: [phone]; Fax: [phone]

## 2016-08-15 NOTE — Patient Instructions (Signed)

## 2017-01-15 ENCOUNTER — Telehealth: Payer: Self-pay | Admitting: Cardiology

## 2017-01-15 NOTE — Telephone Encounter (Signed)
Will forward this message to our medical records dept for [facility], for requested diagnosis and last EF.

## 2017-01-15 NOTE — Telephone Encounter (Signed)
New message    [facility]ervin [facility]Starla [facility] is calling to find out if pt has a diagnosis of CHF and her current weight. Also if it's available she needs a current ejection fraction percentage.

## 2017-01-27 NOTE — Telephone Encounter (Signed)
[facility] with [facility] representative. She will re-fax the Request for records to me. So I can get records sent.

## 2017-03-11 ENCOUNTER — Encounter: Payer: Self-pay | Admitting: Cardiology

## 2017-03-11 ENCOUNTER — Ambulatory Visit (INDEPENDENT_AMBULATORY_CARE_PROVIDER_SITE_OTHER): Payer: Medicare Other | Admitting: Cardiology

## 2017-03-11 VITALS — BP 126/82 | HR 77 | Ht 64.0 in | Wt 309.0 lb

## 2017-03-11 DIAGNOSIS — E7849 Other hyperlipidemia: Secondary | ICD-10-CM

## 2017-03-11 DIAGNOSIS — I119 Hypertensive heart disease without heart failure: Secondary | ICD-10-CM

## 2017-03-11 DIAGNOSIS — I2510 Atherosclerotic heart disease of native coronary artery without angina pectoris: Secondary | ICD-10-CM | POA: Diagnosis not present

## 2017-03-11 DIAGNOSIS — E784 Other hyperlipidemia: Secondary | ICD-10-CM

## 2017-03-11 DIAGNOSIS — F5101 Primary insomnia: Secondary | ICD-10-CM | POA: Diagnosis not present

## 2017-03-11 MED ORDER — LOSARTAN POTASSIUM 25 MG PO TABS: 25.0000 mg | ORAL_TABLET | Freq: Every day | ORAL | 3 refills | Status: DC

## 2017-03-11 NOTE — Patient Instructions (Signed)
Medication Instructions:   DECREASE YOUR LOSARTAN TO 25 MG ONCE DAILY    Labwork:  TODAY--CMET, CBC W DIFF, TSH, MAGNESIUM, AND PRO-BNP     Follow-Up:  6 MONTHS WITH DR Delton SeeNELSON       If you need a refill on your cardiac medications before your next appointment, please call your pharmacy.

## 2017-03-11 NOTE — Progress Notes (Signed)
Cardiology Office Note    Date:  03/11/2017   ID:  Sarah Weaver, DOB 09/26/1935, MRN [id number]  PCP:  Cain SaupeFulp, Cammie, MD  Cardiologist:  Dr. Delton SeeNelson  Chief complain: Orthostatic hypotension, insomnia  History of Present Illness:  Sarah Weaver is a 81 y.o. female who presents for follow-up. She has past medical history of morbid obesity, hypertension, hyperlipidemia, DM II however no past cardiac history who presented on 12/20/2015 with chest pain. EKG showed a mild sinus tachycardia, poor R wave progression in anterior leads, minimal ST depression in V6, otherwise no significant ST-T wave changes. Her BNP was normal at 47.7. Initial troponin were minimally elevated however subsequent troponin continued to trend up. After discussing with the patient, decision was made to take her to the cath lab on the same day of arrival. Cardiac cath performed on 12/20/2015 showed 99% mid LAD lesion treated with Synergy DES 2.5 x 20 mm. Her hemoglobin A1c was elevated at 9.1 suggesting uncontrolled diabetes. Lipids had 12/21/2015 showed cholesterol 150, triglycerides 74, HDL 46, LDL 89. Echocardiogram obtained on 12/21/2015 showed EF 55-60%, no regional wall motion abnormality, grade 1 diastolic dysfunction. Post procedure, patient was placed on aspirin, atenolol, atorvastatin, losartan, HCTZ and Brilinta. She was discharged on 4/7.  I saw her in the cardiology office on 12/28/2015 she did have some lower extremity pitting edema, I gave her a prescription of PRN Lasix. She also has some degree of shortness of breath, it is unclear whether it is related to Brilinta, however I recommended the patient to continue medications at the time. It also turns out she has been taking multiple medications that was supposed to be discontinued in the hospital including her old atenolol, losartan HCTZ, nifedipine, and simvastatin along with the new lisinopril. She was complaining of dry cough both during exertion and at  rest, I decided to stop the lisinopril and switched to losartan instead. I have also stopped the medication she was not supposed to be on based on d/c summary. We obtained BMET which unfortunately showed a rise of creatinine up to 2.62 which is likely related to combination of polypharmacy that dropped BP and slowed down the HR. I asked her to stop the PRN lasix and losartan, and hydrate herself, we rechecked BMET on 01/02/2016, her creatinine has improved to 1.46. I had instructed her to restart on the losartan and PRN Lasix.  During her last follow-up on 4/27, her blood pressure was high, I increased to carvedilol to 12.5 mg twice a day. I have instructed her to follow-up in one month. She presents today for follow-up. Instead of PRN daily lasix, she says she is actually taking 40 mg daily Lasix. She continued to have shortness breath and lower extremity edema. On physical exam, she does have 1+ pitting edema. Her blood pressure is 140/78 today, I will continue to increase her Coreg to 25 mg twice a day. Otherwise she has been doing relatively well. She has been seen by Dr. Marisue HumbleSanford with nephrology, her baseline creatinines seems to be around 1.4 to 1.5 range. Since she continued to have lower extremity edema and her renal function is stable on daily dose of Lasix, I will continue on the current dose. Although she will need to inform her PCP and Dr. Marisue HumbleSanford that she has changed her medication schedule. I will arrange follow-up with Dr. Delton SeeNelson in 3 month.   05/16/2016 - 3 months follow up, she is accompanied by her granddaughter. She overall feels better, complaint  to her meds but stopped using lasix as she felt that it makes her SOB. Her LE edema has resolved, she has occasional PND, no chest pain, no palpitations or syncope. NO side effects from atorvastatin. No bleeding on DAPT.    08/15/2016 - the patient is coming after 3 months, she looks good and feels good. She denies any lower extremity edema orthopnea  or proximal nocturnal dyspnea. She denies any exertional chest pain but states that lately she has been spending a lot of time with her great-granddaughter that wants to be picked up and she feels soreness in her chest when she does that. No palpitations or syncope. She has been compliant with her meds and has no muscle pain with atorvastatin. She is scheduled to see a nephrologist on December 12 who will draw her labs.  03/11/2017, patient is coming after 6 months, she states that she has no chest pain, she is stable dyspnea on exertion, she walks minimally with a walker or cane. She has been feeling dizzy when she gets up and insecure about her balance but no falls. She has been compliant with her medications and is tolerating them well. She complains of constipation occasional on upper extremity cramping on. She has minimal lower extremity edema but no orthopnea or proximal nocturnal dyspnea.   Past Medical History:  Diagnosis Date  . Arthritis    "all over"  . Chronic lower back pain   . Coronary artery disease   . Glaucoma   . Glaucoma of both eyes   . Hyperlipidemia   . Hypertension   . Type II diabetes mellitus (HCC)     Past Surgical History:  Procedure Laterality Date  . CARDIAC CATHETERIZATION N/A 12/20/2015   Procedure: Left Heart Cath and Coronary Angiography;  Surgeon: Runell Gess, MD;  Location: [facility] INVASIVE CV LAB;  Service: Cardiovascular;  Laterality: N/A;  . CARDIAC CATHETERIZATION N/A 12/20/2015   Procedure: Coronary Stent Intervention;  Surgeon: Runell Gess, MD;  Location: [facility] INVASIVE CV LAB;  Service: Cardiovascular;  Laterality: N/A;  mid LAD 2.5 x 20 Synergy  . CATARACT EXTRACTION W/ INTRAOCULAR LENS  IMPLANT, BILATERAL Bilateral   . CORONARY ANGIOPLASTY WITH STENT PLACEMENT  12/20/2015  . LAPAROSCOPIC CHOLECYSTECTOMY  04/23/11  . TONSILLECTOMY      Current Medications: Outpatient Medications Prior to Visit  Medication Sig Dispense Refill  . ACCU-CHEK  SMARTVIEW test strip FOR USE WHEN CHECKING BLOOD SUGARS ONCE DAILY ALTERNATING MORNINGS AND EVENINGS BEFORE MEALS  3  . aspirin EC 81 MG tablet Take 81 mg by mouth daily.    Marland Kitchen atorvastatin (LIPITOR) 80 MG tablet TAKE 1 TABLET BY MOUTH DAILY AT 6PM 90 tablet 3  . brinzolamide (AZOPT) 1 % ophthalmic suspension Place 1 drop into both eyes 3 (three) times daily.     . carvedilol (COREG) 25 MG tablet TAKE 1 TABLET (25 MG TOTAL) BY MOUTH 2 (TWO) TIMES DAILY WITH A MEAL. 60 tablet 11  . Cholecalciferol (NATURAL VITAMIN D-3) 5000 units TABS Take 1 tablet by mouth daily.    . furosemide (LASIX) 40 MG tablet Take 1 tablet (40 mg total) by mouth daily. 90 tablet 3  . glimepiride (AMARYL) 2 MG tablet Take 2 mg by mouth.     . latanoprost (XALATAN) 0.005 % ophthalmic solution Place 1 drop into both eyes at bedtime.     Marland Kitchen oxyCODONE-acetaminophen (PERCOCET/ROXICET) 5-325 MG tablet Take by mouth every 6 (six) hours as needed for severe pain.    Marland Kitchen  sitaGLIPtin (JANUVIA) 50 MG tablet Take 50 mg by mouth daily.    . ticagrelor (BRILINTA) 90 MG TABS tablet Take 1 tablet (90 mg total) by mouth 2 (two) times daily. 180 tablet 3  . losartan (COZAAR) 50 MG tablet Take 1 tablet (50 mg total) by mouth daily. 30 tablet 5   No facility-administered medications prior to visit.      Allergies:   Patient has no known allergies.   Social History   Social History  . Marital status: Divorced    Spouse name: N/A  . Number of children: N/A  . Years of education: N/A   Social History Main Topics  . Smoking status: Never Smoker  . Smokeless tobacco: Never Used  . Alcohol use No  . Drug use: No  . Sexual activity: No   Other Topics Concern  . None   Social History Narrative  . None     Family History:  The patient's family history includes Cancer in her sister; Heart attack in her mother; Heart disease in her mother and sister; Hypertension in her mother; Stroke in her sister.   ROS:   Please see the history of  present illness.    ROS All other systems reviewed and are negative.   PHYSICAL EXAM:   VS:  BP 126/82   Pulse 77   Ht 5\' 4"  (1.626 m)   Wt (!) 309 lb (140.2 kg)   SpO2 98%   BMI 53.04 kg/m    GEN: Well nourished, well developed, in no acute distress  HEENT: normal  Neck: no JVD, carotid bruits, or masses Cardiac: RRR; no murmurs, rubs, or gallops. 1+ pitting edema Respiratory:  clear to auscultation bilaterally, normal work of breathing GI: soft, nontender, nondistended, + BS MS: no deformity or atrophy  Skin: warm and dry, no rash Neuro:  Alert and Oriented x 3, Strength and sensation are intact Psych: euthymic mood, full affect  Wt Readings from Last 3 Encounters:  03/11/17 (!) 309 lb (140.2 kg)  08/15/16 298 lb (135.2 kg)  05/16/16 (!) 302 lb (137 kg)      Studies/Labs Reviewed:   EKG:  EKG is not ordered today.    Recent Labs: No results found for requested labs within last 8760 hours.   Lipid Panel    Component Value Date/Time   CHOL 150 12/21/2015 0400   TRIG 74 12/21/2015 0400   HDL 46 12/21/2015 0400   CHOLHDL 3.3 12/21/2015 0400   VLDL 15 12/21/2015 0400   LDLCALC 89 12/21/2015 0400    Additional studies/ records that were reviewed today include:   Cardiac cath 12/20/2015 Conclusion     Ost 2nd Diag lesion, 40% stenosed.  Ost LAD to Mid LAD lesion, 25% stenosed.  Prox Cx to Mid Cx lesion, 40% stenosed.  Mid LAD lesion, 99% stenosed. Post intervention, there is a 0% residual stenosis.     Echo 12/21/2015 LV EF: 55% - 60%  ------------------------------------------------------------------- Indications: CAD of native vessels 414.01.  ------------------------------------------------------------------- History: PMH: Coronary artery disease. Risk factors: Hypertension. Diabetes mellitus. Morbidly obese.  ------------------------------------------------------------------- Study Conclusions  - Left ventricle: The cavity size  was normal. Wall thickness was  increased in a pattern of mild LVH. Systolic function was normal.  The estimated ejection fraction was in the range of 55% to 60%.  Wall motion was normal; there were no regional wall motion  abnormalities. Doppler parameters are consistent with abnormal  left ventricular relaxation (grade 1 diastolic dysfunction). - Mitral valve:  Calcified annulus.  Impressions:  - Technically difficult; definity used; normal LV systolic  function; grade 1 diastolic dysfunction.    ASSESSMENT:    1. Hypertensive heart disease without heart failure   2. Other hyperlipidemia   3. Coronary artery disease involving native coronary artery of native heart without angina pectoris      PLAN:  In order of problems listed above:  1. CAD - cath 12/20/2015 99% mid LAD lesion treated with Synergy DES 2.5 x 20 mm. - Echo 12/21/2015 EF 55-60%, no RWMA, grade 1 DD. - Continue aspirin and Brilinta, She was offered distal Brilinta but she would rather continue taking it.   - She has stable symptoms, no ischemic workup needed right now.  - She is encouraged to exercise/walking 10-15 minutes daily.  2. HTN: Now quite low for her age with orthostatic hypotension, will decrease losartan from 50-25 mg daily.  3. HLD: on lipitor, well tolerated, we will check LFTs today.  4. DM: Hgb A1C 9.1 on last admission, continue followup with PCP  5. Acute on chronic CKD: lasix 40 mg po daily, she is euvolemic today.  6. Insomnia, she is advised to try taking melatonin for she goes to bed  7. Constipation, she is advised to use Colace and milk of magnesia as well as prune juice.  Follow-up in 6 months.  Medication Adjustments/Labs and Tests Ordered: Current medicines are reviewed at length with the patient today.  Concerns regarding medicines are outlined above.  Medication changes, Labs and Tests ordered today are listed in the Patient  Instructions below. Patient Instructions  Medication Instructions:   DECREASE YOUR LOSARTAN TO 25 MG ONCE DAILY    Labwork:  TODAY--CMET, CBC W DIFF, TSH, MAGNESIUM, AND PRO-BNP     Follow-Up:  6 MONTHS WITH DR Delton See       If you need a refill on your cardiac medications before your next appointment, please call your pharmacy.      Signed, Tobias Alexander, MD  03/11/2017 1:20 PM    [facility] [address] [place], [place], [place]  [address] Phone: [phone]; Fax: [phone]

## 2017-03-12 ENCOUNTER — Other Ambulatory Visit: Payer: Self-pay | Admitting: Physician Assistant

## 2017-03-12 LAB — CBC WITH DIFFERENTIAL/PLATELET
Basophils Absolute: 0 10*3/uL (ref 0.0–0.2)
Basos: 0 %
EOS (ABSOLUTE): 0.1 10*3/uL (ref 0.0–0.4)
Eos: 2 %
Hematocrit: 35.7 % (ref 34.0–46.6)
Hemoglobin: 11.4 g/dL (ref 11.1–15.9)
Immature Grans (Abs): 0 10*3/uL (ref 0.0–0.1)
Immature Granulocytes: 0 %
Lymphocytes Absolute: 1.4 10*3/uL (ref 0.7–3.1)
Lymphs: 34 %
MCH: 27.6 pg (ref 26.6–33.0)
MCHC: 31.9 g/dL (ref 31.5–35.7)
MCV: 86 fL (ref 79–97)
Monocytes Absolute: 0.3 10*3/uL (ref 0.1–0.9)
Monocytes: 8 %
Neutrophils Absolute: 2.2 10*3/uL (ref 1.4–7.0)
Neutrophils: 56 %
Platelets: 215 10*3/uL (ref 150–379)
RBC: 4.13 x10E6/uL (ref 3.77–5.28)
RDW: 14.6 % (ref 12.3–15.4)
WBC: 4 10*3/uL (ref 3.4–10.8)

## 2017-03-12 LAB — COMPREHENSIVE METABOLIC PANEL
ALT: 15 IU/L (ref 0–32)
AST: 15 IU/L (ref 0–40)
Albumin/Globulin Ratio: 1.4 (ref 1.2–2.2)
Albumin: 3.8 g/dL (ref 3.5–4.8)
Alkaline Phosphatase: 102 IU/L (ref 39–117)
BUN/Creatinine Ratio: 13 (ref 12–28)
BUN: 19 mg/dL (ref 8–27)
Bilirubin Total: 0.5 mg/dL (ref 0.0–1.2)
CO2: 25 mmol/L (ref 20–29)
Calcium: 9.5 mg/dL (ref 8.7–10.3)
Chloride: 102 mmol/L (ref 96–106)
Creatinine, Ser: 1.43 mg/dL — ABNORMAL HIGH (ref 0.57–1.00)
GFR calc Af Amer: 40 mL/min/{1.73_m2} — ABNORMAL LOW (ref >59)
GFR calc non Af Amer: 35 mL/min/{1.73_m2} — ABNORMAL LOW (ref >59)
Globulin, Total: 2.8 g/dL (ref 1.5–4.5)
Glucose: 197 mg/dL — ABNORMAL HIGH (ref 65–99)
Potassium: 4.4 mmol/L (ref 3.5–5.2)
Sodium: 142 mmol/L (ref 134–144)
Total Protein: 6.6 g/dL (ref 6.0–8.5)

## 2017-03-12 LAB — MAGNESIUM: Magnesium: 1.8 mg/dL (ref 1.6–2.3)

## 2017-03-12 LAB — PRO B NATRIURETIC PEPTIDE: NT-Pro BNP: 321 pg/mL (ref 0–738)

## 2017-03-12 LAB — TSH: TSH: 3.92 u[IU]/mL (ref 0.450–4.500)

## 2017-03-12 NOTE — Telephone Encounter (Signed)
Please review for refill, Thanks !  

## 2017-05-27 ENCOUNTER — Other Ambulatory Visit: Payer: Self-pay | Admitting: Cardiology

## 2017-05-27 DIAGNOSIS — I10 Essential (primary) hypertension: Secondary | ICD-10-CM

## 2017-05-27 DIAGNOSIS — I2510 Atherosclerotic heart disease of native coronary artery without angina pectoris: Secondary | ICD-10-CM

## 2017-07-14 ENCOUNTER — Other Ambulatory Visit: Payer: Self-pay | Admitting: Cardiology

## 2017-08-21 ENCOUNTER — Other Ambulatory Visit: Payer: Self-pay | Admitting: Cardiology

## 2017-09-14 ENCOUNTER — Encounter: Payer: Self-pay | Admitting: Cardiology

## 2017-09-14 ENCOUNTER — Ambulatory Visit (INDEPENDENT_AMBULATORY_CARE_PROVIDER_SITE_OTHER): Payer: Medicare Other | Admitting: Cardiology

## 2017-09-14 VITALS — BP 132/72 | HR 70 | Ht 64.0 in | Wt 312.0 lb

## 2017-09-14 DIAGNOSIS — R600 Localized edema: Secondary | ICD-10-CM

## 2017-09-14 DIAGNOSIS — N183 Chronic kidney disease, stage 3 unspecified: Secondary | ICD-10-CM

## 2017-09-14 DIAGNOSIS — I5033 Acute on chronic diastolic (congestive) heart failure: Secondary | ICD-10-CM

## 2017-09-14 DIAGNOSIS — I2510 Atherosclerotic heart disease of native coronary artery without angina pectoris: Secondary | ICD-10-CM | POA: Diagnosis not present

## 2017-09-14 DIAGNOSIS — R079 Chest pain, unspecified: Secondary | ICD-10-CM | POA: Diagnosis not present

## 2017-09-14 LAB — BASIC METABOLIC PANEL
BUN/Creatinine Ratio: 15 (ref 12–28)
BUN: 21 mg/dL (ref 8–27)
CO2: 25 mmol/L (ref 20–29)
Calcium: 9.2 mg/dL (ref 8.7–10.3)
Chloride: 99 mmol/L (ref 96–106)
Creatinine, Ser: 1.36 mg/dL — ABNORMAL HIGH (ref 0.57–1.00)
GFR calc Af Amer: 43 mL/min/{1.73_m2} — ABNORMAL LOW (ref >59)
GFR calc non Af Amer: 37 mL/min/{1.73_m2} — ABNORMAL LOW (ref >59)
Glucose: 211 mg/dL — ABNORMAL HIGH (ref 65–99)
Potassium: 4.4 mmol/L (ref 3.5–5.2)
Sodium: 140 mmol/L (ref 134–144)

## 2017-09-14 LAB — PRO B NATRIURETIC PEPTIDE: NT-Pro BNP: 206 pg/mL (ref 0–738)

## 2017-09-14 MED ORDER — FUROSEMIDE 40 MG PO TABS: 40.0000 mg | ORAL_TABLET | Freq: Two times a day (BID) | ORAL | 11 refills | Status: DC

## 2017-09-14 NOTE — Patient Instructions (Addendum)
Medication Instructions:  1) INCREASE LASIX to 40 mg twice daily  Labwork: TODAY: BMET, BNP  Testing/Procedures: None  Follow-Up: You have an appointment scheduled with Dr. Delton SeeNelson October 15, 2017 at 10:40 AM.   Any Other Special Instructions Will Be Listed Below (If Applicable).     If you need a refill on your cardiac medications before your next appointment, please call your pharmacy.

## 2017-09-14 NOTE — Progress Notes (Signed)
Cardiology Office Note    Date:  09/16/2017   ID:  Sarah GurneyFreddie Mae Bardin, DOB Nov 03, 1935, MRN [id number]  PCP:  Cain SaupeFulp, Cammie, MD (Inactive)  Cardiologist:  Dr. Delton SeeNelson  Chief complain: DOE  History of Present Illness:  Sarah Weaver is a 81 y.o. female who presents for follow-up. She has past medical history of morbid obesity, hypertension, hyperlipidemia, DM II however no past cardiac history who presented on 12/20/2015 with chest pain. EKG showed a mild sinus tachycardia, poor R wave progression in anterior leads, minimal ST depression in V6, otherwise no significant ST-T wave changes. Her BNP was normal at 47.7. Initial troponin were minimally elevated however subsequent troponin continued to trend up. After discussing with the patient, decision was made to take her to the cath lab on the same day of arrival. Cardiac cath performed on 12/20/2015 showed 99% mid LAD lesion treated with Synergy DES 2.5 x 20 mm. Her hemoglobin A1c was elevated at 9.1 suggesting uncontrolled diabetes. Lipids had 12/21/2015 showed cholesterol 150, triglycerides 74, HDL 46, LDL 89. Echocardiogram obtained on 12/21/2015 showed EF 55-60%, no regional wall motion abnormality, grade 1 diastolic dysfunction. Post procedure, patient was placed on aspirin, atenolol, atorvastatin, losartan, HCTZ and Brilinta. She was discharged on 4/7.  I saw her in the cardiology office on 12/28/2015 she did have some lower extremity pitting edema, I gave her a prescription of PRN Lasix. She also has some degree of shortness of breath, it is unclear whether it is related to Brilinta, however I recommended the patient to continue medications at the time. It also turns out she has been taking multiple medications that was supposed to be discontinued in the hospital including her old atenolol, losartan HCTZ, nifedipine, and simvastatin along with the new lisinopril. She was complaining of dry cough both during exertion and at rest, I decided to  stop the lisinopril and switched to losartan instead. I have also stopped the medication she was not supposed to be on based on d/c summary. We obtained BMET which unfortunately showed a rise of creatinine up to 2.62 which is likely related to combination of polypharmacy that dropped BP and slowed down the HR. I asked her to stop the PRN lasix and losartan, and hydrate herself, we rechecked BMET on 01/02/2016, her creatinine has improved to 1.46. I had instructed her to restart on the losartan and PRN Lasix.  During her last follow-up on 4/27, her blood pressure was high, I increased to carvedilol to 12.5 mg twice a day. I have instructed her to follow-up in one month. She presents today for follow-up. Instead of PRN daily lasix, she says she is actually taking 40 mg daily Lasix. She continued to have shortness breath and lower extremity edema. On physical exam, she does have 1+ pitting edema. Her blood pressure is 140/78 today, I will continue to increase her Coreg to 25 mg twice a day. Otherwise she has been doing relatively well. She has been seen by Dr. Marisue HumbleSanford with nephrology, her baseline creatinines seems to be around 1.4 to 1.5 range. Since she continued to have lower extremity edema and her renal function is stable on daily dose of Lasix, I will continue on the current dose. Although she will need to inform her PCP and Dr. Marisue HumbleSanford that she has changed her medication schedule. I will arrange follow-up with Dr. Delton SeeNelson in 3 month.   05/16/2016 - 3 months follow up, she is accompanied by her granddaughter. She overall feels better, complaint to  her meds but stopped using lasix as she felt that it makes her SOB. Her LE edema has resolved, she has occasional PND, no chest pain, no palpitations or syncope. NO side effects from atorvastatin. No bleeding on DAPT.    08/15/2016 - the patient is coming after 3 months, she looks good and feels good. She denies any lower extremity edema orthopnea or proximal  nocturnal dyspnea. She denies any exertional chest pain but states that lately she has been spending a lot of time with her great-granddaughter that wants to be picked up and she feels soreness in her chest when she does that. No palpitations or syncope. She has been compliant with her meds and has no muscle pain with atorvastatin. She is scheduled to see a nephrologist on December 12 who will draw her labs.  09/14/2017 - 6 months follow up, the patient complains of worsening DOE, some PND and 2 pillow orthopnea, there is mild LE edema, no chest pain, palpitations or falls. Tolerates medications well.    Past Medical History:  Diagnosis Date  . Arthritis    "all over"  . Chronic lower back pain   . Coronary artery disease   . Glaucoma   . Glaucoma of both eyes   . Hyperlipidemia   . Hypertension   . Type II diabetes mellitus (HCC)     Past Surgical History:  Procedure Laterality Date  . CARDIAC CATHETERIZATION N/A 12/20/2015   Procedure: Left Heart Cath and Coronary Angiography;  Surgeon: Runell Gess, MD;  Location: [facility] INVASIVE CV LAB;  Service: Cardiovascular;  Laterality: N/A;  . CARDIAC CATHETERIZATION N/A 12/20/2015   Procedure: Coronary Stent Intervention;  Surgeon: Runell Gess, MD;  Location: [facility] INVASIVE CV LAB;  Service: Cardiovascular;  Laterality: N/A;  mid LAD 2.5 x 20 Synergy  . CATARACT EXTRACTION W/ INTRAOCULAR LENS  IMPLANT, BILATERAL Bilateral   . CORONARY ANGIOPLASTY WITH STENT PLACEMENT  12/20/2015  . LAPAROSCOPIC CHOLECYSTECTOMY  04/23/11  . TONSILLECTOMY      Current Medications: Outpatient Medications Prior to Visit  Medication Sig Dispense Refill  . ACCU-CHEK SMARTVIEW test strip FOR USE WHEN CHECKING BLOOD SUGARS ONCE DAILY ALTERNATING MORNINGS AND EVENINGS BEFORE MEALS  3  . aspirin EC 81 MG tablet Take 81 mg by mouth daily.    Marland Kitchen atorvastatin (LIPITOR) 80 MG tablet TAKE 1 TABLET BY MOUTH DAILY AT 6PM 90 tablet 2  . brinzolamide (AZOPT) 1 % ophthalmic  suspension Place 1 drop into both eyes 3 (three) times daily.     . carvedilol (COREG) 25 MG tablet TAKE 1 TABLET (25 MG TOTAL) BY MOUTH 2 (TWO) TIMES DAILY WITH A MEAL. 60 tablet 8  . Cholecalciferol (NATURAL VITAMIN D-3) 5000 units TABS Take 1 tablet by mouth daily.    Marland Kitchen glimepiride (AMARYL) 2 MG tablet Take 2 mg by mouth.     . latanoprost (XALATAN) 0.005 % ophthalmic solution Place 1 drop into both eyes at bedtime.     Marland Kitchen oxyCODONE-acetaminophen (PERCOCET/ROXICET) 5-325 MG tablet Take by mouth every 6 (six) hours as needed for severe pain.    . sitaGLIPtin (JANUVIA) 50 MG tablet Take 50 mg by mouth daily.    . furosemide (LASIX) 40 MG tablet Take 1 tablet (40 mg total) by mouth daily. 90 tablet 3  . losartan (COZAAR) 25 MG tablet Take 1 tablet (25 mg total) by mouth daily. 90 tablet 3  . ticagrelor (BRILINTA) 90 MG TABS tablet Take 1 tablet (90 mg total) by mouth  2 (two) times daily. (Patient not taking: Reported on 09/14/2017) 180 tablet 3   No facility-administered medications prior to visit.      Allergies:   Patient has no known allergies.   Social History   Socioeconomic History  . Marital status: Divorced    Spouse name: None  . Number of children: None  . Years of education: None  . Highest education level: None  Social Needs  . Financial resource strain: None  . Food insecurity - worry: None  . Food insecurity - inability: None  . Transportation needs - medical: None  . Transportation needs - non-medical: None  Occupational History  . None  Tobacco Use  . Smoking status: Never Smoker  . Smokeless tobacco: Never Used  Substance and Sexual Activity  . Alcohol use: No  . Drug use: No  . Sexual activity: No  Other Topics Concern  . None  Social History Narrative  . None     Family History:  The patient's family history includes Cancer in her sister; Heart attack in her mother; Heart disease in her mother and sister; Hypertension in her mother; Stroke in her sister.    ROS:   Please see the history of present illness.    ROS All other systems reviewed and are negative.   PHYSICAL EXAM:   VS:  BP 132/72   Pulse 70   Ht 5\' 4"  (1.626 m)   Wt (!) 312 lb (141.5 kg)   BMI 53.55 kg/m    GEN: Well nourished, well developed, in no acute distress  HEENT: normal  Neck: no JVD, carotid bruits, or masses Cardiac: RRR; no murmurs, rubs, or gallops. 1+ pitting edema Respiratory:  clear to auscultation bilaterally, normal work of breathing GI: soft, nontender, nondistended, + BS MS: no deformity or atrophy  Skin: warm and dry, no rash Neuro:  Alert and Oriented x 3, Strength and sensation are intact Psych: euthymic mood, full affect  Wt Readings from Last 3 Encounters:  09/14/17 (!) 312 lb (141.5 kg)  03/11/17 (!) 309 lb (140.2 kg)  08/15/16 298 lb (135.2 kg)      Studies/Labs Reviewed:   EKG:  EKG is not ordered today.    Recent Labs: 03/11/2017: ALT 15; Hemoglobin 11.4; Magnesium 1.8; Platelets 215; TSH 3.920 09/14/2017: BUN 21; Creatinine, Ser 1.36; NT-Pro BNP 206; Potassium 4.4; Sodium 140   Lipid Panel    Component Value Date/Time   CHOL 150 12/21/2015 0400   TRIG 74 12/21/2015 0400   HDL 46 12/21/2015 0400   CHOLHDL 3.3 12/21/2015 0400   VLDL 15 12/21/2015 0400   LDLCALC 89 12/21/2015 0400    Additional studies/ records that were reviewed today include:   Cardiac cath 12/20/2015 Conclusion     Ost 2nd Diag lesion, 40% stenosed.  Ost LAD to Mid LAD lesion, 25% stenosed.  Prox Cx to Mid Cx lesion, 40% stenosed.  Mid LAD lesion, 99% stenosed. Post intervention, there is a 0% residual stenosis.     Echo 12/21/2015 LV EF: 55% - 60%  ------------------------------------------------------------------- Indications: CAD of native vessels 414.01.  ------------------------------------------------------------------- History: PMH: Coronary artery disease. Risk factors: Hypertension. Diabetes mellitus. Morbidly  obese.  ------------------------------------------------------------------- Study Conclusions  - Left ventricle: The cavity size was normal. Wall thickness was  increased in a pattern of mild LVH. Systolic function was normal.  The estimated ejection fraction was in the range of 55% to 60%.  Wall motion was normal; there were no regional wall motion  abnormalities.  Doppler parameters are consistent with abnormal  left ventricular relaxation (grade 1 diastolic dysfunction). - Mitral valve: Calcified annulus.  Impressions:  - Technically difficult; definity used; normal LV systolic  function; grade 1 diastolic dysfunction.  Ecg done today 09/14/2017 shows normal SR, normal ECG, negative T waves in the inferolateral leads have resolved.    ASSESSMENT:    1. Acute on chronic diastolic heart failure (HCC)   2. CAD in native artery   3. Chest pain, unspecified type      PLAN:  In order of problems listed above:  1. Acute on chronic diastolic CHF - start lasix 40 mg po daily  - chcek BMP, BNP today - follow up in 4 weeks  2. CAD - cath 12/20/2015 99% mid LAD lesion treated with Synergy DES 2.5 x 20 mm. - Echo 12/21/2015 EF 55-60%, no RWMA, grade 1 DD. - Continue aspirin, discontinue Brilinta,   - EKG today shows resolution of previously seen negative T waves in   inferolateral leads  2. HTN: Now controlled.  3. HLD: on lipitor, well tolerated  4. DM: Hgb A1C 9.1 on last admission, continue followup with PCP  5. Acute on chronic CKD: her creatinine today is 1.36 from 2.6, she is seeing a nephrologis   Follow-up in 6 months.  Medication Adjustments/Labs and Tests Ordered: Current medicines are reviewed at length with the patient today.  Concerns regarding medicines are outlined above.  Medication changes, Labs and Tests ordered today are listed in the Patient Instructions below. Patient Instructions  Medication Instructions:   1) INCREASE LASIX to 40 mg twice daily  Labwork: TODAY: BMET, BNP  Testing/Procedures: None  Follow-Up: You have an appointment scheduled with Dr. Delton See October 15, 2017 at 10:40 AM.   Any Other Special Instructions Will Be Listed Below (If Applicable).     If you need a refill on your cardiac medications before your next appointment, please call your pharmacy.      Signed, Tobias Alexander, MD  09/16/2017 10:41 PM    [facility] [address] [place], [place], [place]  [address] Phone: [phone]; Fax: [phone]

## 2017-09-29 ENCOUNTER — Encounter: Payer: Self-pay | Admitting: Cardiology

## 2017-10-15 ENCOUNTER — Encounter: Payer: Self-pay | Admitting: Cardiology

## 2017-10-15 ENCOUNTER — Ambulatory Visit (INDEPENDENT_AMBULATORY_CARE_PROVIDER_SITE_OTHER): Payer: Medicare Other | Admitting: Cardiology

## 2017-10-15 VITALS — BP 150/70 | HR 67 | Resp 16 | Ht 64.0 in | Wt 315.2 lb

## 2017-10-15 DIAGNOSIS — I119 Hypertensive heart disease without heart failure: Secondary | ICD-10-CM

## 2017-10-15 DIAGNOSIS — I5030 Unspecified diastolic (congestive) heart failure: Secondary | ICD-10-CM

## 2017-10-15 DIAGNOSIS — E782 Mixed hyperlipidemia: Secondary | ICD-10-CM | POA: Diagnosis not present

## 2017-10-15 MED ORDER — FUROSEMIDE 40 MG PO TABS: 40.0000 mg | ORAL_TABLET | Freq: Every day | ORAL | 1 refills | Status: DC

## 2017-10-15 NOTE — Patient Instructions (Signed)
Medication Instructions:   DECREASE YOUR LASIX TO 40 MG ONCE DAILY     Follow-Up:  3 MONTHS WITH DR Delton SeeNELSON       If you need a refill on your cardiac medications before your next appointment, please call your pharmacy.

## 2017-10-15 NOTE — Progress Notes (Signed)
Cardiology Office Note    Date:  10/15/2017   ID:  Sarah Weaver, Park Meo 1936-06-12, MRN [id number]  PCP:  Cain Saupe, MD (Inactive)  Cardiologist:  Dr. Delton See  Chief complain: DOE  History of Present Illness:  Sarah Weaver is a 82 y.o. female who presents for follow-up. She has past medical history of morbid obesity, hypertension, hyperlipidemia, DM II however no past cardiac history who presented on 12/20/2015 with chest pain. EKG showed a mild sinus tachycardia, poor R wave progression in anterior leads, minimal ST depression in V6, otherwise no significant ST-T wave changes. Her BNP was normal at 47.7. Initial troponin were minimally elevated however subsequent troponin continued to trend up. After discussing with the patient, decision was made to take her to the cath lab on the same day of arrival. Cardiac cath performed on 12/20/2015 showed 99% mid LAD lesion treated with Synergy DES 2.5 x 20 mm. Her hemoglobin A1c was elevated at 9.1 suggesting uncontrolled diabetes. Lipids had 12/21/2015 showed cholesterol 150, triglycerides 74, HDL 46, LDL 89. Echocardiogram obtained on 12/21/2015 showed EF 55-60%, no regional wall motion abnormality, grade 1 diastolic dysfunction. Post procedure, patient was placed on aspirin, atenolol, atorvastatin, losartan, HCTZ and Brilinta. She was discharged on 4/7.  I saw her in the cardiology office on 12/28/2015 she did have some lower extremity pitting edema, I gave her a prescription of PRN Lasix. She also has some degree of shortness of breath, it is unclear whether it is related to Brilinta, however I recommended the patient to continue medications at the time. It also turns out she has been taking multiple medications that was supposed to be discontinued in the hospital including her old atenolol, losartan HCTZ, nifedipine, and simvastatin along with the new lisinopril. She was complaining of dry cough both during exertion and at rest, I decided to  stop the lisinopril and switched to losartan instead. I have also stopped the medication she was not supposed to be on based on d/c summary. We obtained BMET which unfortunately showed a rise of creatinine up to 2.62 which is likely related to combination of polypharmacy that dropped BP and slowed down the HR. I asked her to stop the PRN lasix and losartan, and hydrate herself, we rechecked BMET on 01/02/2016, her creatinine has improved to 1.46. I had instructed her to restart on the losartan and PRN Lasix.  05/16/2016 - 3 months follow up, she is accompanied by her granddaughter. She overall feels better, complaint to her meds but stopped using lasix as she felt that it makes her SOB. Her LE edema has resolved, she has occasional PND, no chest pain, no palpitations or syncope. NO side effects from atorvastatin. No bleeding on DAPT.    09/14/2017 - 6 months follow up, the patient complains of worsening DOE, some PND and 2 pillow orthopnea, there is mild LE edema, no chest pain, palpitations or falls. Tolerates medications well.   10/15/2017 - this is  4 weeks follow-up, the patient states that would increase Lasix for lower extremity edema has improved, she's not experiencing any shortness of breath at rest and stable dyspnea on exertion. She walks with a walker. She denies any orthopnea or nocturnal dyspnea. She's been compliant with her medications and has no side effects.  Past Medical History:  Diagnosis Date  . Arthritis    "all over"  . Chronic lower back pain   . Coronary artery disease   . Glaucoma   . Glaucoma of both  eyes   . Hyperlipidemia   . Hypertension   . Type II diabetes mellitus (HCC)     Past Surgical History:  Procedure Laterality Date  . CARDIAC CATHETERIZATION N/A 12/20/2015   Procedure: Left Heart Cath and Coronary Angiography;  Surgeon: Runell Gess, MD;  Location: [facility] INVASIVE CV LAB;  Service: Cardiovascular;  Laterality: N/A;  . CARDIAC CATHETERIZATION N/A 12/20/2015     Procedure: Coronary Stent Intervention;  Surgeon: Runell Gess, MD;  Location: [facility] INVASIVE CV LAB;  Service: Cardiovascular;  Laterality: N/A;  mid LAD 2.5 x 20 Synergy  . CATARACT EXTRACTION W/ INTRAOCULAR LENS  IMPLANT, BILATERAL Bilateral   . CORONARY ANGIOPLASTY WITH STENT PLACEMENT  12/20/2015  . LAPAROSCOPIC CHOLECYSTECTOMY  04/23/11  . TONSILLECTOMY      Current Medications: Outpatient Medications Prior to Visit  Medication Sig Dispense Refill  . ACCU-CHEK SMARTVIEW test strip FOR USE WHEN CHECKING BLOOD SUGARS ONCE DAILY ALTERNATING MORNINGS AND EVENINGS BEFORE MEALS  3  . aspirin EC 81 MG tablet Take 81 mg by mouth daily.    Marland Kitchen atorvastatin (LIPITOR) 80 MG tablet TAKE 1 TABLET BY MOUTH DAILY AT 6PM 90 tablet 2  . brinzolamide (AZOPT) 1 % ophthalmic suspension Place 1 drop into both eyes 3 (three) times daily.     . carvedilol (COREG) 25 MG tablet TAKE 1 TABLET (25 MG TOTAL) BY MOUTH 2 (TWO) TIMES DAILY WITH A MEAL. 60 tablet 8  . Cholecalciferol (NATURAL VITAMIN D-3) 5000 units TABS Take 1 tablet by mouth daily.    Marland Kitchen glimepiride (AMARYL) 2 MG tablet Take 2 mg by mouth.     . latanoprost (XALATAN) 0.005 % ophthalmic solution Place 1 drop into both eyes at bedtime.     Marland Kitchen losartan (COZAAR) 25 MG tablet Take 1 tablet (25 mg total) by mouth daily. 90 tablet 3  . oxyCODONE-acetaminophen (PERCOCET/ROXICET) 5-325 MG tablet Take by mouth every 6 (six) hours as needed for severe pain.    . sitaGLIPtin (JANUVIA) 50 MG tablet Take 50 mg by mouth daily.    . furosemide (LASIX) 40 MG tablet Take 1 tablet (40 mg total) by mouth 2 (two) times daily. 60 tablet 11   No facility-administered medications prior to visit.      Allergies:   Patient has no known allergies.   Social History   Socioeconomic History  . Marital status: Divorced    Spouse name: None  . Number of children: None  . Years of education: None  . Highest education level: None  Social Needs  . Financial resource strain:  None  . Food insecurity - worry: None  . Food insecurity - inability: None  . Transportation needs - medical: None  . Transportation needs - non-medical: None  Occupational History  . None  Tobacco Use  . Smoking status: Never Smoker  . Smokeless tobacco: Never Used  Substance and Sexual Activity  . Alcohol use: No  . Drug use: No  . Sexual activity: No  Other Topics Concern  . None  Social History Narrative  . None     Family History:  The patient's family history includes Cancer in her sister; Heart attack in her mother; Heart disease in her mother and sister; Hypertension in her mother; Stroke in her sister.   ROS:   Please see the history of present illness.    ROS All other systems reviewed and are negative.   PHYSICAL EXAM:   VS:  BP (!) 150/70   Pulse  67   Resp 16   Ht 5\' 4"  (1.626 m)   Wt (!) 315 lb 3.2 oz (143 kg)   SpO2 94%   BMI 54.10 kg/m    GEN: Well nourished, well developed, in no acute distress  HEENT: normal  Neck: no JVD, carotid bruits, or masses Cardiac: RRR; no murmurs, rubs, or gallops. Minimal B/L LE edema Respiratory:  clear to auscultation bilaterally, normal work of breathing GI: soft, nontender, nondistended, + BS MS: no deformity or atrophy  Skin: warm and dry, no rash Neuro:  Alert and Oriented x 3, Strength and sensation are intact Psych: euthymic mood, full affect  Wt Readings from Last 3 Encounters:  10/15/17 (!) 315 lb 3.2 oz (143 kg)  09/14/17 (!) 312 lb (141.5 kg)  03/11/17 (!) 309 lb (140.2 kg)      Studies/Labs Reviewed:   EKG:  EKG is not ordered today.    Recent Labs: 03/11/2017: ALT 15; Hemoglobin 11.4; Magnesium 1.8; Platelets 215; TSH 3.920 09/14/2017: BUN 21; Creatinine, Ser 1.36; NT-Pro BNP 206; Potassium 4.4; Sodium 140   Lipid Panel    Component Value Date/Time   CHOL 150 12/21/2015 0400   TRIG 74 12/21/2015 0400   HDL 46 12/21/2015 0400   CHOLHDL 3.3 12/21/2015 0400   VLDL 15 12/21/2015 0400   LDLCALC  89 12/21/2015 0400    Additional studies/ records that were reviewed today include:   Cardiac cath 12/20/2015 Conclusion     Ost 2nd Diag lesion, 40% stenosed.  Ost LAD to Mid LAD lesion, 25% stenosed.  Prox Cx to Mid Cx lesion, 40% stenosed.  Mid LAD lesion, 99% stenosed. Post intervention, there is a 0% residual stenosis.     Echo 12/21/2015 LV EF: 55% - 60%  ------------------------------------------------------------------- Indications: CAD of native vessels 414.01.  ------------------------------------------------------------------- History: PMH: Coronary artery disease. Risk factors: Hypertension. Diabetes mellitus. Morbidly obese.  ------------------------------------------------------------------- Study Conclusions  - Left ventricle: The cavity size was normal. Wall thickness was  increased in a pattern of mild LVH. Systolic function was normal.  The estimated ejection fraction was in the range of 55% to 60%.  Wall motion was normal; there were no regional wall motion  abnormalities. Doppler parameters are consistent with abnormal  left ventricular relaxation (grade 1 diastolic dysfunction). - Mitral valve: Calcified annulus.  Impressions:  - Technically difficult; definity used; normal LV systolic  function; grade 1 diastolic dysfunction.  Ecg done today 09/14/2017 shows normal SR, normal ECG, negative T waves in the inferolateral leads have resolved.    ASSESSMENT:    1. Diastolic congestive heart failure, unspecified HF chronicity (HCC)   2. Hypertensive heart disease without heart failure   3. Mixed hyperlipidemia      PLAN:  In order of problems listed above:  1. Acute on chronic diastolic CHF, improved, minimal LE edema - decrease lasix to 40 mg po daily  - follow up in 3 months  2. CAD - cath 12/20/2015 99% mid LAD lesion treated with Synergy DES 2.5 x 20 mm. - Echo 12/21/2015 EF 55-60%, no RWMA,  grade 1 DD. - Continue aspirin, discontinue Brilinta,   - EKG today shows resolution of previously seen negative T waves in inferolateral leads  2. HTN: repeat BP controlled.  3. HLD: on lipitor, well tolerated  4. DM: Hgb A1C 9.1 on last admission, continue followup with PCP  5. Acute on chronic CKD: her creatinine today is 1.36 from 2.6, she is seeing a nephrologist in February  Follow-up  in 3 months.  Medication Adjustments/Labs and Tests Ordered: Current medicines are reviewed at length with the patient today.  Concerns regarding medicines are outlined above.  Medication changes, Labs and Tests ordered today are listed in the Patient Instructions below. Patient Instructions  Medication Instructions:   DECREASE YOUR LASIX TO 40 MG ONCE DAILY     Follow-Up:  3 MONTHS WITH DR Delton See       If you need a refill on your cardiac medications before your next appointment, please call your pharmacy.      Signed, Tobias Alexander, MD  10/15/2017 11:45 AM    [facility] [address] [place], [place], [place]  [address] Phone: [phone]; Fax: [phone]

## 2017-11-25 ENCOUNTER — Telehealth: Payer: Self-pay | Admitting: Cardiology

## 2017-11-25 NOTE — Telephone Encounter (Signed)
New message    Patient states she has a cold is there anything over the counter that she can take that will not interact with her medication

## 2017-11-25 NOTE — Telephone Encounter (Signed)
Spoke with the pt and provided her safe OTC meds/options to take with her cardiac meds and history. Advised the pt that she can safely take Coricidin, follow instructions as directed, she can use OTC claritin without the D (if sinus related), she may use regular nasal saline spray as needed for stuffy nose/sinus issues, and she should get sugar-free cough drops, for she is diabetic. Advised the pt that she can use Tylenol as needed for pain/fever, but to avoid all NSAIDS.  Advised the pt that if she starts running a fever, and cough worsens or becomes colored productive sputum, then she should refer to her PCP for further eval.  Pt verbalized understanding and agrees with this plan.  Pt gracious for all the assistance provided.

## 2017-12-07 ENCOUNTER — Telehealth: Payer: Self-pay | Admitting: Cardiology

## 2017-12-07 NOTE — Telephone Encounter (Signed)
Left Sarah Weaver a vm asking her to fax over the EF% form before records can be released from here.

## 2017-12-07 NOTE — Telephone Encounter (Signed)
NEW MESSAGE  [facility]ole [facility] [phone] EXT [phone]  Fax # [phone]  1) Are you calling to confirm a diagnosis or obtain personal health information (Y/N)? YES  2) If so, what information is requested? EF%, Blood pressure, Pulse  Please route to Medical Records or your medical records site representative

## 2017-12-15 ENCOUNTER — Other Ambulatory Visit: Payer: Self-pay | Admitting: Cardiology

## 2017-12-22 ENCOUNTER — Telehealth: Payer: Self-pay | Admitting: Cardiology

## 2017-12-22 NOTE — Telephone Encounter (Signed)
New Message  **Dot phrase not loaded**  Joni ReiningNicole Ward a nurse at [facility] is requesting an objection fraction. Please call Fax # [phone]

## 2018-01-19 ENCOUNTER — Other Ambulatory Visit: Payer: Self-pay | Admitting: Cardiology

## 2018-01-19 DIAGNOSIS — E7849 Other hyperlipidemia: Secondary | ICD-10-CM

## 2018-01-19 DIAGNOSIS — I2510 Atherosclerotic heart disease of native coronary artery without angina pectoris: Secondary | ICD-10-CM

## 2018-01-19 DIAGNOSIS — I119 Hypertensive heart disease without heart failure: Secondary | ICD-10-CM

## 2018-01-26 ENCOUNTER — Ambulatory Visit (INDEPENDENT_AMBULATORY_CARE_PROVIDER_SITE_OTHER): Payer: Medicare Other | Admitting: Cardiology

## 2018-01-26 ENCOUNTER — Encounter: Payer: Self-pay | Admitting: Cardiology

## 2018-01-26 VITALS — BP 156/88 | HR 82 | Ht 64.0 in | Wt 308.5 lb

## 2018-01-26 DIAGNOSIS — E782 Mixed hyperlipidemia: Secondary | ICD-10-CM

## 2018-01-26 DIAGNOSIS — I2510 Atherosclerotic heart disease of native coronary artery without angina pectoris: Secondary | ICD-10-CM | POA: Diagnosis not present

## 2018-01-26 DIAGNOSIS — I10 Essential (primary) hypertension: Secondary | ICD-10-CM

## 2018-01-26 DIAGNOSIS — I119 Hypertensive heart disease without heart failure: Secondary | ICD-10-CM

## 2018-01-26 MED ORDER — ATORVASTATIN CALCIUM 80 MG PO TABS: ORAL_TABLET | ORAL | 3 refills | Status: DC

## 2018-01-26 MED ORDER — CARVEDILOL 25 MG PO TABS: 25.0000 mg | ORAL_TABLET | Freq: Two times a day (BID) | ORAL | 2 refills | Status: DC

## 2018-01-26 MED ORDER — FUROSEMIDE 40 MG PO TABS: 40.0000 mg | ORAL_TABLET | Freq: Every day | ORAL | 3 refills | Status: DC

## 2018-01-26 MED ORDER — LISINOPRIL 5 MG PO TABS: 5.0000 mg | ORAL_TABLET | Freq: Every day | ORAL | 3 refills | Status: DC

## 2018-01-26 NOTE — Patient Instructions (Signed)
Medication Instructions:   STOP TAKING LOSARTAN NOW  START TAKING LISINOPRIL 5 MG ONCE DAILY     Follow-Up:  Your physician wants you to follow-up in: 6 MONTHS WITH DR Johnell Comings will receive a reminder letter in the mail two months in advance. If you don't receive a letter, please call our office to schedule the follow-up appointment.        If you need a refill on your cardiac medications before your next appointment, please call your pharmacy.

## 2018-01-26 NOTE — Progress Notes (Signed)
Cardiology Office Note    Date:  01/26/2018   ID:  Sarah Weaver, DOB 09-07-1936, MRN [id number]  PCP:  Cain Saupe, MD (Inactive)  Cardiologist:  Dr. Delton See  Chief complain: DOE  History of Present Illness:  Sarah Weaver is a 82 y.o. female who presents for follow-up. She has past medical history of morbid obesity, hypertension, hyperlipidemia, DM II however no past cardiac history who presented on 12/20/2015 with chest pain. EKG showed a mild sinus tachycardia, poor R wave progression in anterior leads, minimal ST depression in V6, otherwise no significant ST-T wave changes. Her BNP was normal at 47.7. Initial troponin were minimally elevated however subsequent troponin continued to trend up. After discussing with the patient, decision was made to take her to the cath lab on the same day of arrival. Cardiac cath performed on 12/20/2015 showed 99% mid LAD lesion treated with Synergy DES 2.5 x 20 mm. Her hemoglobin A1c was elevated at 9.1 suggesting uncontrolled diabetes. Lipids had 12/21/2015 showed cholesterol 150, triglycerides 74, HDL 46, LDL 89. Echocardiogram obtained on 12/21/2015 showed EF 55-60%, no regional wall motion abnormality, grade 1 diastolic dysfunction. Post procedure, patient was placed on aspirin, atenolol, atorvastatin, losartan, HCTZ and Brilinta. She was discharged on 4/7.  I saw her in the cardiology office on 12/28/2015 she did have some lower extremity pitting edema, I gave her a prescription of PRN Lasix. She also has some degree of shortness of breath, it is unclear whether it is related to Brilinta, however I recommended the patient to continue medications at the time. It also turns out she has been taking multiple medications that was supposed to be discontinued in the hospital including her old atenolol, losartan HCTZ, nifedipine, and simvastatin along with the new lisinopril. She was complaining of dry cough both during exertion and at rest, I decided to  stop the lisinopril and switched to losartan instead. I have also stopped the medication she was not supposed to be on based on d/c summary. We obtained BMET which unfortunately showed a rise of creatinine up to 2.62 which is likely related to combination of polypharmacy that dropped BP and slowed down the HR. I asked her to stop the PRN lasix and losartan, and hydrate herself, we rechecked BMET on 01/02/2016, her creatinine has improved to 1.46. I had instructed her to restart on the losartan and PRN Lasix.  05/16/2016 - 3 months follow up, she is accompanied by her granddaughter. She overall feels better, complaint to her meds but stopped using lasix as she felt that it makes her SOB. Her LE edema has resolved, she has occasional PND, no chest pain, no palpitations or syncope. NO side effects from atorvastatin. No bleeding on DAPT.    09/14/2017 - 6 months follow up, the patient complains of worsening DOE, some PND and 2 pillow orthopnea, there is mild LE edema, no chest pain, palpitations or falls. Tolerates medications well.   10/15/2017 - this is  4 weeks follow-up, the patient states that would increase Lasix for lower extremity edema has improved, she's not experiencing any shortness of breath at rest and stable dyspnea on exertion. She walks with a walker. She denies any orthopnea or nocturnal dyspnea. She's been compliant with her medications and has no side effects.  Jan 26, 2018 -this is 3 months follow-up, the patient feels great, she has intermittent lower extremity edema especially when she does not elevate her legs.  She denies any orthopnea or proximal nocturnal dyspnea.  Her sleep has improved with use of melatonin.  She denies any chest pain, she has stable shortness of breath and is able to walk with a walker.  Past Medical History:  Diagnosis Date  . Arthritis    "all over"  . Chronic lower back pain   . Coronary artery disease   . Glaucoma   . Glaucoma of both eyes   .  Hyperlipidemia   . Hypertension   . Type II diabetes mellitus (HCC)     Past Surgical History:  Procedure Laterality Date  . CARDIAC CATHETERIZATION N/A 12/20/2015   Procedure: Left Heart Cath and Coronary Angiography;  Surgeon: Runell Gess, MD;  Location: [facility] INVASIVE CV LAB;  Service: Cardiovascular;  Laterality: N/A;  . CARDIAC CATHETERIZATION N/A 12/20/2015   Procedure: Coronary Stent Intervention;  Surgeon: Runell Gess, MD;  Location: [facility] INVASIVE CV LAB;  Service: Cardiovascular;  Laterality: N/A;  mid LAD 2.5 x 20 Synergy  . CATARACT EXTRACTION W/ INTRAOCULAR LENS  IMPLANT, BILATERAL Bilateral   . CORONARY ANGIOPLASTY WITH STENT PLACEMENT  12/20/2015  . LAPAROSCOPIC CHOLECYSTECTOMY  04/23/11  . TONSILLECTOMY      Current Medications: Outpatient Medications Prior to Visit  Medication Sig Dispense Refill  . ACCU-CHEK SMARTVIEW test strip FOR USE WHEN CHECKING BLOOD SUGARS ONCE DAILY ALTERNATING MORNINGS AND EVENINGS BEFORE MEALS  3  . aspirin EC 81 MG tablet Take 81 mg by mouth daily.    . brinzolamide (AZOPT) 1 % ophthalmic suspension Place 1 drop into both eyes 3 (three) times daily.     . Cholecalciferol (NATURAL VITAMIN D-3) 5000 units TABS Take 1 tablet by mouth daily.    Marland Kitchen glimepiride (AMARYL) 2 MG tablet Take 2 mg by mouth.     . latanoprost (XALATAN) 0.005 % ophthalmic solution Place 1 drop into both eyes at bedtime.     Marland Kitchen oxyCODONE-acetaminophen (PERCOCET/ROXICET) 5-325 MG tablet Take by mouth every 6 (six) hours as needed for severe pain.    . sitaGLIPtin (JANUVIA) 50 MG tablet Take 50 mg by mouth daily.    Marland Kitchen atorvastatin (LIPITOR) 80 MG tablet TAKE 1 TABLET BY MOUTH DAILY AT 6PM 90 tablet 2  . carvedilol (COREG) 25 MG tablet TAKE 1 TABLET (25 MG TOTAL) BY MOUTH 2 (TWO) TIMES DAILY WITH A MEAL. 60 tablet 8  . losartan (COZAAR) 25 MG tablet TAKE 1 TABLET BY MOUTH ONCE DAILY 90 tablet 2  . furosemide (LASIX) 40 MG tablet Take 1 tablet (40 mg total) by mouth daily. 90  tablet 1   No facility-administered medications prior to visit.      Allergies:   Patient has no known allergies.   Social History   Socioeconomic History  . Marital status: Divorced    Spouse name: Not on file  . Number of children: Not on file  . Years of education: Not on file  . Highest education level: Not on file  Occupational History  . Not on file  Social Needs  . Financial resource strain: Not on file  . Food insecurity:    Worry: Not on file    Inability: Not on file  . Transportation needs:    Medical: Not on file    Non-medical: Not on file  Tobacco Use  . Smoking status: Never Smoker  . Smokeless tobacco: Never Used  Substance and Sexual Activity  . Alcohol use: No  . Drug use: No  . Sexual activity: Never  Lifestyle  . Physical activity:  Days per week: Not on file    Minutes per session: Not on file  . Stress: Not on file  Relationships  . Social connections:    Talks on phone: Not on file    Gets together: Not on file    Attends religious service: Not on file    Active member of club or organization: Not on file    Attends meetings of clubs or organizations: Not on file    Relationship status: Not on file  Other Topics Concern  . Not on file  Social History Narrative  . Not on file     Family History:  The patient's family history includes Cancer in her sister; Heart attack in her mother; Heart disease in her mother and sister; Hypertension in her mother; Stroke in her sister.   ROS:   Please see the history of present illness.    ROS All other systems reviewed and are negative.   PHYSICAL EXAM:   VS:  BP (!) 156/88   Pulse 82   Ht  (1.626 m)   Wt (!) 308 lb 8 oz (139.9 kg)   SpO2 97%   BMI 52.95 kg/m    GEN: Well nourished, well developed, in no acute distress  HEENT: normal  Neck: no JVD, carotid bruits, or masses Cardiac: RRR; no murmurs, rubs, or gallops. Minimal B/L LE edema Respiratory:  clear to auscultation  bilaterally, normal work of breathing GI: soft, nontender, nondistended, + BS MS: no deformity or atrophy  Skin: warm and dry, no rash Neuro:  Alert and Oriented x 3, Strength and sensation are intact Psych: euthymic mood, full affect  Wt Readings from Last 3 Encounters:  01/26/18 (!) 308 lb 8 oz (139.9 kg)  10/15/17 (!) 315 lb 3.2 oz (143 kg)  09/14/17 (!) 312 lb (141.5 kg)      Studies/Labs Reviewed:   EKG:  EKG is not ordered today.    Recent Labs: 03/11/2017: ALT 15; Hemoglobin 11.4; Magnesium 1.8; Platelets 215; TSH 3.920 09/14/2017: BUN 21; Creatinine, Ser 1.36; NT-Pro BNP 206; Potassium 4.4; Sodium 140   Lipid Panel    Component Value Date/Time   CHOL 150 12/21/2015 0400   TRIG 74 12/21/2015 0400   HDL 46 12/21/2015 0400   CHOLHDL 3.3 12/21/2015 0400   VLDL 15 12/21/2015 0400   LDLCALC 89 12/21/2015 0400    Additional studies/ records that were reviewed today include:   Cardiac cath 12/20/2015 Conclusion     Ost 2nd Diag lesion, 40% stenosed.  Ost LAD to Mid LAD lesion, 25% stenosed.  Prox Cx to Mid Cx lesion, 40% stenosed.  Mid LAD lesion, 99% stenosed. Post intervention, there is a 0% residual stenosis.     Echo 12/21/2015 LV EF: 55% - 60%  ------------------------------------------------------------------- Indications: CAD of native vessels 414.01.  ------------------------------------------------------------------- History: PMH: Coronary artery disease. Risk factors: Hypertension. Diabetes mellitus. Morbidly obese.  ------------------------------------------------------------------- Study Conclusions  - Left ventricle: The cavity size was normal. Wall thickness was  increased in a pattern of mild LVH. Systolic function was normal.  The estimated ejection fraction was in the range of 55% to 60%.  Wall motion was normal; there were no regional wall motion  abnormalities. Doppler parameters are consistent with abnormal  left  ventricular relaxation (grade 1 diastolic dysfunction). - Mitral valve: Calcified annulus.  Impressions:  - Technically difficult; definity used; normal LV systolic  function; grade 1 diastolic dysfunction.  Ecg done today 09/14/2017 shows normal SR, normal ECG, negative  T waves in the inferolateral leads have resolved.    ASSESSMENT:    1. Hypertensive heart disease without heart failure   2. CAD in native artery   3. Mixed hyperlipidemia   4. Essential hypertension   5. Coronary artery disease involving native coronary artery of native heart without angina pectoris      PLAN:  In order of problems listed above:  1. Acute on chronic diastolic CHF, improved, mild LE edema -Continue Lasix to 40 mg po daily and use an extra Lasix in the afternoon if her swelling gets worse. - follow up in 6 months  2. CAD - cath 12/20/2015 99% mid LAD lesion treated with Synergy DES 2.5 x 20 mm. - Echo 12/21/2015 EF 55-60%, no RWMA, grade 1 DD. - Continue aspirin, discontinue Brilinta,   - EKG today shows resolution of previously seen negative T waves in inferolateral leads  2. HTN: She has not taken her medications today yet.  Continue the same regimen.  3. HLD: on lipitor, well tolerated  4. DM: Hgb A1C 9.1 on last admission, continue followup with PCP  5. Chronic CKD stage III.  Continue the same dose of Lasix most recent creatinine 1.36.  Follow-up in 3 months.  Medication Adjustments/Labs and Tests Ordered: Current medicines are reviewed at length with the patient today.  Concerns regarding medicines are outlined above.  Medication changes, Labs and Tests ordered today are listed in the Patient Instructions below. Patient Instructions  Medication Instructions:   STOP TAKING LOSARTAN NOW  START TAKING LISINOPRIL 5 MG ONCE DAILY     Follow-Up:  Your physician wants you to follow-up in: 6 MONTHS WITH DR Johnell Comings will receive a reminder  letter in the mail two months in advance. If you don't receive a letter, please call our office to schedule the follow-up appointment.        If you need a refill on your cardiac medications before your next appointment, please call your pharmacy.      Signed, Tobias Alexander, MD  01/26/2018 10:28 AM    [facility] [address] [place], [place], [place]  [address] Phone: [phone]; Fax: [phone]

## 2018-01-29 ENCOUNTER — Telehealth: Payer: Self-pay | Admitting: Cardiology

## 2018-01-29 NOTE — Telephone Encounter (Signed)
Will route this message to our medical records Rep to review and follow-up with [facility], upon return to the office.

## 2018-01-29 NOTE — Telephone Encounter (Signed)
New Message:       She is needing a injection fraction and bp and pulse on pt

## 2018-02-04 ENCOUNTER — Telehealth: Payer: Self-pay | Admitting: Cardiology

## 2018-02-04 NOTE — Telephone Encounter (Signed)
New message    [employer] calling to report 13lbs weight loss. No swelling  No Shortness of breath   [place]- [phone] ext [phone] [facility] sending fax report. No phone call needed

## 2018-02-04 NOTE — Telephone Encounter (Signed)
Will forward to Dr Nelson as an FYI. 

## 2018-03-03 DIAGNOSIS — E119 Type 2 diabetes mellitus without complications: Secondary | ICD-10-CM | POA: Diagnosis not present

## 2018-03-03 DIAGNOSIS — I10 Essential (primary) hypertension: Secondary | ICD-10-CM | POA: Diagnosis not present

## 2018-03-03 DIAGNOSIS — T82855S Stenosis of coronary artery stent, sequela: Secondary | ICD-10-CM | POA: Diagnosis not present

## 2018-03-03 DIAGNOSIS — R21 Rash and other nonspecific skin eruption: Secondary | ICD-10-CM | POA: Diagnosis not present

## 2018-03-29 ENCOUNTER — Telehealth: Payer: Self-pay | Admitting: Cardiology

## 2018-03-29 MED ORDER — LOSARTAN POTASSIUM 25 MG PO TABS: 25.0000 mg | ORAL_TABLET | Freq: Every day | ORAL | 1 refills | Status: DC

## 2018-03-29 NOTE — Telephone Encounter (Signed)
New Message       Patient would like a call back concerning a cough she developed.

## 2018-03-29 NOTE — Telephone Encounter (Signed)
Please discontinue lisinopril and start losartan 25 mg daily.

## 2018-03-29 NOTE — Telephone Encounter (Signed)
Pt is calling to inform Dr Delton SeeNelson that she has had a dry cough as well as some sneezing over the last couple of months.  Pt report her cough is dry and non-productive.  Pt reports it feels like a tickle in her throat.  Pt would like Dr Delton SeeNelson to advise if its a medicine causing her issues, or if its allergy related, and should she take something as needed for this.  Informed the pt that I will route this message to Dr Delton SeeNelson to review and advise on, and follow-up with the pt thereafter.  Pt verbalized understanding and agrees with this plan.

## 2018-03-29 NOTE — Telephone Encounter (Signed)
Spoke with the pt and informed her that per Dr Delton SeeNelson, we will stop her lisinopril, and start her on losartan 25 mg po daily.  Confirmed the pharmacy of choice with the pt. Informed the pt that I will send in a months supply to make sure she tolerates this change.  Advised the pt to call back if cough continues.  Pt verbalized understanding and agrees with this plan.

## 2018-04-06 DIAGNOSIS — I10 Essential (primary) hypertension: Secondary | ICD-10-CM | POA: Diagnosis not present

## 2018-04-06 DIAGNOSIS — Z13220 Encounter for screening for lipoid disorders: Secondary | ICD-10-CM | POA: Diagnosis not present

## 2018-04-06 DIAGNOSIS — E119 Type 2 diabetes mellitus without complications: Secondary | ICD-10-CM | POA: Diagnosis not present

## 2018-04-07 DIAGNOSIS — H401131 Primary open-angle glaucoma, bilateral, mild stage: Secondary | ICD-10-CM | POA: Diagnosis not present

## 2018-04-07 DIAGNOSIS — E113291 Type 2 diabetes mellitus with mild nonproliferative diabetic retinopathy without macular edema, right eye: Secondary | ICD-10-CM | POA: Diagnosis not present

## 2018-04-07 DIAGNOSIS — Z961 Presence of intraocular lens: Secondary | ICD-10-CM | POA: Diagnosis not present

## 2018-04-07 DIAGNOSIS — H35372 Puckering of macula, left eye: Secondary | ICD-10-CM | POA: Diagnosis not present

## 2018-04-07 LAB — HM DIABETES EYE EXAM

## 2018-04-23 ENCOUNTER — Telehealth: Payer: Self-pay | Admitting: Cardiology

## 2018-04-23 NOTE — Telephone Encounter (Signed)
Spoke with patient who states she continues to have coughing despite recent change from Lisinopril to Losartan. Patient states she has not been taking the Losartan, only taking Carvedilol. States coughing starts as soon as she takes the Carvedilol in the morning and in the evening and continues through the night. States she cannot sleep due to coughing, denies SOB.  Also c/o bilateral swelling in feet through the day, improves during sleep. Taking Lasix 40 mg daily with good urinary output. Patient also associates leg swelling with Carvedilol. States she would like to try a different medication. She denies hx of asthma. I advised that I will forward message to Dr. Delton SeeNelson for advice and call patient back when advice is received. Patient verbalized understanding and agreement and thanked me for my help.

## 2018-04-23 NOTE — Telephone Encounter (Signed)
Please see if his insurance covers Bystolic 2.5 mg po daily and switch from carvedilol to [facility]olic

## 2018-04-23 NOTE — Telephone Encounter (Signed)
New Message   Pt c/o medication issue:  1. Name of Medication: carvedilol (COREG) 25 MG tablet  2. How are you currently taking this medication (dosage and times per day)? Take 1 tablet (25 mg total) by mouth 2 (two) times daily with a meal.  3. Are you having a reaction (difficulty breathing--STAT)?   4. What is your medication issue? Pt states she is having a bad cough

## 2018-04-26 DIAGNOSIS — I10 Essential (primary) hypertension: Secondary | ICD-10-CM | POA: Diagnosis not present

## 2018-04-26 DIAGNOSIS — R944 Abnormal results of kidney function studies: Secondary | ICD-10-CM | POA: Diagnosis not present

## 2018-04-27 MED ORDER — NEBIVOLOL HCL 2.5 MG PO TABS: 2.5000 mg | ORAL_TABLET | Freq: Every day | ORAL | 1 refills | Status: DC

## 2018-04-27 NOTE — Telephone Encounter (Signed)
Sarah Weaver would you be able to see if his insurance will cover Bystolic? Thanks so much

## 2018-04-27 NOTE — Telephone Encounter (Signed)
Spoke with the pt and informed her that per Dr Delton SeeNelson, we will stop her carvedilol, and send in a new script of Bystolic 2.5 mg po daily, and see if her insurance carrier will cover this med or not. Informed the pt that we will send this into [facility], and if a prior auth is needed, this will kick back to our office for our Prior Auth Nurse Larita FifeLynn, to work on. Updated carvedilol in pts allergies as an intolerance. Pt verbalized understanding and agrees with this plan. Will send this message to our Prior Auth Nurse Larita FifeLynn Via, for further follow-up as needed.

## 2018-04-27 NOTE — Telephone Encounter (Signed)
The RX must be sent to the pharmacy before I can do anything. Please send RX to pharmacy and notify the pt of change. Thanks.

## 2018-05-11 DIAGNOSIS — J309 Allergic rhinitis, unspecified: Secondary | ICD-10-CM | POA: Diagnosis not present

## 2018-05-11 DIAGNOSIS — E119 Type 2 diabetes mellitus without complications: Secondary | ICD-10-CM | POA: Diagnosis not present

## 2018-05-11 DIAGNOSIS — Z13220 Encounter for screening for lipoid disorders: Secondary | ICD-10-CM | POA: Diagnosis not present

## 2018-05-11 DIAGNOSIS — I10 Essential (primary) hypertension: Secondary | ICD-10-CM | POA: Diagnosis not present

## 2018-05-11 LAB — HEMOGLOBIN A1C: Hgb A1c MFr Bld: 8.3 — AB (ref 4.0–6.0)

## 2018-05-11 LAB — BASIC METABOLIC PANEL
Glucose: 187
Potassium: 4 (ref 3.4–5.3)
Sodium: 138 (ref 137–147)

## 2018-05-11 LAB — CBC AND DIFFERENTIAL
HCT: 36 (ref 36–46)
Hemoglobin: 12.3 (ref 12.0–16.0)
WBC: 4

## 2018-05-24 ENCOUNTER — Other Ambulatory Visit: Payer: Self-pay | Admitting: Cardiology

## 2018-06-02 ENCOUNTER — Encounter: Payer: Self-pay | Admitting: Internal Medicine

## 2018-06-10 ENCOUNTER — Telehealth: Payer: Self-pay | Admitting: Cardiology

## 2018-06-10 DIAGNOSIS — H40013 Open angle with borderline findings, low risk, bilateral: Secondary | ICD-10-CM | POA: Diagnosis not present

## 2018-06-10 NOTE — Telephone Encounter (Signed)
[employer] RN is calling to only inform Dr Delton See the pt had a weight loss of 5.4 lbs within 3 days.  Pt has no other changes or complaints.  Pt is taking all cardiac meds prescribed.  Will send to Dr Delton See as an Lorain Childes.

## 2018-06-10 NOTE — Telephone Encounter (Signed)
New message    Sarah Weaver states that the patient has had weight loss of 5.4 wt loss within 3 days. Patient has had no other changes. Please follow up with patient unless you have questions for Christina.

## 2018-06-15 ENCOUNTER — Ambulatory Visit (INDEPENDENT_AMBULATORY_CARE_PROVIDER_SITE_OTHER): Payer: Medicare Other | Admitting: Internal Medicine

## 2018-06-15 ENCOUNTER — Encounter: Payer: Self-pay | Admitting: Internal Medicine

## 2018-06-15 VITALS — BP 140/80 | HR 68 | Temp 98.2°F | Ht 62.5 in | Wt 299.8 lb

## 2018-06-15 DIAGNOSIS — Z794 Long term (current) use of insulin: Secondary | ICD-10-CM | POA: Diagnosis not present

## 2018-06-15 DIAGNOSIS — E118 Type 2 diabetes mellitus with unspecified complications: Secondary | ICD-10-CM

## 2018-06-15 DIAGNOSIS — I10 Essential (primary) hypertension: Secondary | ICD-10-CM

## 2018-06-15 DIAGNOSIS — E782 Mixed hyperlipidemia: Secondary | ICD-10-CM

## 2018-06-15 NOTE — Patient Instructions (Addendum)
Continue walking and watching what you eat.  We will call you when your labs come back. Stay on current medication dose for now.

## 2018-06-15 NOTE — Progress Notes (Addendum)
Subjective:     Patient ID: Sarah Weaver, female   DOB: September 21, 1935, 82 y.o.   MRN: [id number]  Pt  Is here for FU DM. States she has been still walking, but not more than to the parking lot. She is still having smaller portions when she eats.  Her glucose have been on average in the past 2 weeks 140 or less, only ones was 170 ( fasting). Denies any episodes of low blood sugar. Denies any wounds on feet, or numbness on feet. Needs needles for her Hali Marry pen she received this week and did not come with pens. Her son taught her how to use it, so she feels she can do this herself instead of relying on her grandaoughter.   Allergies  Allergen Reactions  . Carvedilol Cough    Pt reports causes her to cough  . Lisinopril Cough    REPORTS CAUSES DRY COUGH   Current Outpatient Medications:  .  ACCU-CHEK SMARTVIEW test strip, FOR USE WHEN CHECKING BLOOD SUGARS ONCE DAILY ALTERNATING MORNINGS AND EVENINGS BEFORE MEALS, Disp: , Rfl: 3 .  aspirin EC 81 MG tablet, Take 81 mg by mouth daily., Disp: , Rfl:  .  atorvastatin (LIPITOR) 80 MG tablet, TAKE 1 TABLET BY MOUTH DAILY AT 6PM, Disp: 90 tablet, Rfl: 3 .  brinzolamide (AZOPT) 1 % ophthalmic suspension, Place 1 drop into both eyes 3 (three) times daily. , Disp: , Rfl:  .  Cholecalciferol (NATURAL VITAMIN D-3) 5000 units TABS, Take 1 tablet by mouth daily., Disp: , Rfl:  .  furosemide (LASIX) 40 MG tablet, Take 1 tablet (40 mg total) by mouth daily., Disp: 90 tablet, Rfl: 3 .  Insulin Degludec (TRESIBA) 100 UNIT/ML SOLN, Inject into the skin. 7 units at bedtime daily, Disp: , Rfl:  .  latanoprost (XALATAN) 0.005 % ophthalmic solution, Place 1 drop into both eyes at bedtime. , Disp: , Rfl:  .  losartan (COZAAR) 25 MG tablet, TAKE 1 TABLET BY MOUTH EVERY DAY, Disp: 30 tablet, Rfl: 7 .  nebivolol (BYSTOLIC) 2.5 MG tablet, Take 1 tablet (2.5 mg total) by mouth daily., Disp: 90 tablet, Rfl: 1 .  sitaGLIPtin (JANUVIA) 50 MG tablet, Take 50 mg by mouth  daily., Disp: , Rfl:  .  glimepiride (AMARYL) 2 MG tablet, Take 2 mg by mouth. , Disp: , Rfl:  .  loratadine (CLARITIN) 10 MG tablet, Take 10 mg by mouth daily., Disp: , Rfl:  .  nystatin (MYCOSTATIN) 100000 UNIT/ML suspension, Take 5 mLs by mouth 4 (four) times daily., Disp: , Rfl:  .  oxyCODONE-acetaminophen (PERCOCET/ROXICET) 5-325 MG tablet, Take by mouth every 6 (six) hours as needed for severe pain., Disp: , Rfl:  .  triamcinolone cream (KENALOG) 0.5 %, Apply 1 application topically 3 (three) times daily., Disp: , Rfl:  Past Medical History:  Diagnosis Date  . Arthritis    "all over"  . Chronic lower back pain   . Coronary artery disease   . Glaucoma   . Glaucoma of both eyes   . Hyperlipidemia   . Hypertension   . Type II diabetes mellitus (HCC)     Review of Systems  Constitutional: Negative for activity change, appetite change and diaphoresis.  HENT: Negative.   Respiratory: Negative for cough, chest tightness and shortness of breath.   Cardiovascular: Negative for chest pain, palpitations and leg swelling.  Gastrointestinal: Negative for constipation, diarrhea, nausea and vomiting.  Endocrine: Negative for polydipsia, polyphagia and polyuria.  Genitourinary: Negative for  dysuria.  Musculoskeletal:       Uses a walker   Skin: Negative for rash.  Neurological: Negative for weakness and numbness.       Objective:   Physical Exam  Constitutional: She is oriented to person, place, and time. She appears well-developed and well-nourished.  HENT:  Head: Normocephalic.  Right Ear: External ear normal.  Left Ear: External ear normal.  Nose: Nose normal.  Eyes: Conjunctivae are normal. No scleral icterus.  Neck: Normal range of motion. Neck supple. No thyromegaly present.  No carotid bruits  Cardiovascular: Normal rate, regular rhythm and normal heart sounds.  No murmur heard. Pulmonary/Chest: Effort normal and breath sounds normal. No respiratory distress.   Lymphadenopathy:    She has no cervical adenopathy.  Neurological: She is alert and oriented to person, place, and time.  Skin: Skin is warm and dry. She is not diaphoretic.  Psychiatric: She has a normal mood and affect. Her behavior is normal. Judgment and thought content normal.       Assessment:     1-DM type 2 with improved control- chronic  2-HTN- stable 3- Moderage mixed hyperliidemia- chronic    Plan:       I gave her written rx for needles for her pen.  Labs ordered as noted, we will call her when results are back.  FU 3 months.

## 2018-06-16 LAB — COMPREHENSIVE METABOLIC PANEL
ALT: 18 IU/L (ref 0–32)
AST: 17 IU/L (ref 0–40)
Albumin/Globulin Ratio: 1.4 (ref 1.2–2.2)
Albumin: 3.9 g/dL (ref 3.5–4.7)
Alkaline Phosphatase: 97 IU/L (ref 39–117)
BUN/Creatinine Ratio: 12 (ref 12–28)
BUN: 17 mg/dL (ref 8–27)
Bilirubin Total: 0.5 mg/dL (ref 0.0–1.2)
CO2: 23 mmol/L (ref 20–29)
Calcium: 9.6 mg/dL (ref 8.7–10.3)
Chloride: 100 mmol/L (ref 96–106)
Creatinine, Ser: 1.37 mg/dL — ABNORMAL HIGH (ref 0.57–1.00)
GFR calc Af Amer: 42 mL/min/{1.73_m2} — ABNORMAL LOW (ref >59)
GFR calc non Af Amer: 36 mL/min/{1.73_m2} — ABNORMAL LOW (ref >59)
Globulin, Total: 2.8 g/dL (ref 1.5–4.5)
Glucose: 172 mg/dL — ABNORMAL HIGH (ref 65–99)
Potassium: 4.5 mmol/L (ref 3.5–5.2)
Sodium: 139 mmol/L (ref 134–144)
Total Protein: 6.7 g/dL (ref 6.0–8.5)

## 2018-06-16 LAB — CBC
Hematocrit: 40.2 % (ref 34.0–46.6)
Hemoglobin: 12.7 g/dL (ref 11.1–15.9)
MCH: 27.6 pg (ref 26.6–33.0)
MCHC: 31.6 g/dL (ref 31.5–35.7)
MCV: 87 fL (ref 79–97)
Platelets: 191 10*3/uL (ref 150–450)
RBC: 4.6 x10E6/uL (ref 3.77–5.28)
RDW: 13 % (ref 12.3–15.4)
WBC: 3.7 10*3/uL (ref 3.4–10.8)

## 2018-06-16 LAB — LIPID PANEL
Chol/HDL Ratio: 3.7 ratio (ref 0.0–4.4)
Cholesterol, Total: 189 mg/dL (ref 100–199)
HDL: 51 mg/dL (ref >39)
LDL Calculated: 120 mg/dL — ABNORMAL HIGH (ref 0–99)
Triglycerides: 89 mg/dL (ref 0–149)
VLDL Cholesterol Cal: 18 mg/dL (ref 5–40)

## 2018-06-16 LAB — HEMOGLOBIN A1C
Est. average glucose Bld gHb Est-mCnc: 192 mg/dL
Hgb A1c MFr Bld: 8.3 % — ABNORMAL HIGH (ref 4.8–5.6)

## 2018-06-18 ENCOUNTER — Other Ambulatory Visit: Payer: Self-pay | Admitting: Internal Medicine

## 2018-06-24 ENCOUNTER — Telehealth: Payer: Self-pay

## 2018-06-24 NOTE — Telephone Encounter (Signed)
Patient called with BS readings  10/2   120 10/3    128 10/4    119 10/5    140  10/6    121 10/7    126 10/8    144 10/9    127 10/10  141

## 2018-06-24 NOTE — Telephone Encounter (Signed)
Looks good

## 2018-06-25 NOTE — Telephone Encounter (Signed)
Made patient aware.

## 2018-07-08 ENCOUNTER — Telehealth: Payer: Self-pay

## 2018-07-08 NOTE — Telephone Encounter (Signed)
Patient called to let you know her BS Readings   10/17  111 10/18  123 10/19  120 10/20  123 10/21  129 10/22  116 10/23  107 10/24  129

## 2018-07-08 NOTE — Telephone Encounter (Signed)
They look good.

## 2018-07-26 DIAGNOSIS — I5030 Unspecified diastolic (congestive) heart failure: Secondary | ICD-10-CM | POA: Insufficient documentation

## 2018-07-26 DIAGNOSIS — N183 Chronic kidney disease, stage 3 unspecified: Secondary | ICD-10-CM | POA: Insufficient documentation

## 2018-07-26 DIAGNOSIS — I5032 Chronic diastolic (congestive) heart failure: Secondary | ICD-10-CM | POA: Insufficient documentation

## 2018-07-26 DIAGNOSIS — I2510 Atherosclerotic heart disease of native coronary artery without angina pectoris: Secondary | ICD-10-CM | POA: Insufficient documentation

## 2018-07-26 HISTORY — DX: Chronic kidney disease, stage 3 unspecified: N18.30

## 2018-07-26 NOTE — Progress Notes (Signed)
Cardiology Office Note    Date:  07/27/2018   ID:  Sarah Weaver, Sarah Weaver, MRN [id number]  PCP:  Dorothyann Peng, MD  Cardiologist: Tobias Alexander, MD EPS: None  No chief complaint on file.   History of Present Illness:  Sarah Weaver is a 82 y.o. female with history of CAD status post DES to the LAD 12/2015, hypertension, HLD, DM type II, morbid obesity, CKD stage III chronic diastolic CHF.    Last saw Dr. Delton See 01/2018 with intermittent lower extremity edema when she did not keep her legs elevated treated with Lasix 40 mg daily.  Patient comes in for f/u. Denies chest tightness or pressure. Walks around a parking lot everyday without trouble. No swelling.LDL 120 06/15/18 on lipitor 80 mg daily.  Past Medical History:  Diagnosis Date  . Arthritis    "all over"  . Chronic lower back pain   . Coronary artery disease   . Glaucoma   . Glaucoma of both eyes   . Hyperlipidemia   . Hypertension   . Type II diabetes mellitus (HCC)     Past Surgical History:  Procedure Laterality Date  . CARDIAC CATHETERIZATION N/A 12/20/2015   Procedure: Left Heart Cath and Coronary Angiography;  Surgeon: Runell Gess, MD;  Location: [facility] INVASIVE CV LAB;  Service: Cardiovascular;  Laterality: N/A;  . CARDIAC CATHETERIZATION N/A 12/20/2015   Procedure: Coronary Stent Intervention;  Surgeon: Runell Gess, MD;  Location: [facility] INVASIVE CV LAB;  Service: Cardiovascular;  Laterality: N/A;  mid LAD 2.5 x 20 Synergy  . CATARACT EXTRACTION W/ INTRAOCULAR LENS  IMPLANT, BILATERAL Bilateral   . CORONARY ANGIOPLASTY WITH STENT PLACEMENT  12/20/2015  . LAPAROSCOPIC CHOLECYSTECTOMY  04/23/11  . TONSILLECTOMY      Current Medications: Current Meds  Medication Sig  . ACCU-CHEK SMARTVIEW test strip FOR USE WHEN CHECKING BLOOD SUGARS ONCE DAILY ALTERNATING MORNINGS AND EVENINGS BEFORE MEALS  . aspirin EC 81 MG tablet Take 81 mg by mouth daily.  Marland Kitchen atorvastatin (LIPITOR) 80 MG tablet  TAKE 1 TABLET BY MOUTH DAILY AT 6PM  . brinzolamide (AZOPT) 1 % ophthalmic suspension Place 1 drop into both eyes 3 (three) times daily.   . Cholecalciferol (NATURAL VITAMIN D-3) 5000 units TABS Take 1 tablet by mouth daily.  . furosemide (LASIX) 40 MG tablet Take 1 tablet (40 mg total) by mouth daily.  . Insulin Degludec (TRESIBA) 100 UNIT/ML SOLN Inject into the skin. 7 units at bedtime daily  . Latanoprostene Bunod (VYZULTA) 0.024 % SOLN Apply 1 drop to eye daily.  Marland Kitchen losartan (COZAAR) 25 MG tablet TAKE 1 TABLET BY MOUTH EVERY DAY  . nebivolol (BYSTOLIC) 2.5 MG tablet Take 1 tablet (2.5 mg total) by mouth daily.  Marland Kitchen oxyCODONE-acetaminophen (PERCOCET/ROXICET) 5-325 MG tablet Take by mouth every 6 (six) hours as needed for severe pain.  . sitaGLIPtin (JANUVIA) 50 MG tablet Take 50 mg by mouth daily.     Allergies:   Carvedilol and Lisinopril   Social History   Socioeconomic History  . Marital status: Divorced    Spouse name: Not on file  . Number of children: Not on file  . Years of education: Not on file  . Highest education level: Not on file  Occupational History  . Not on file  Social Needs  . Financial resource strain: Not on file  . Food insecurity:    Worry: Not on file    Inability: Not on file  . Transportation needs:  Medical: Not on file    Non-medical: Not on file  Tobacco Use  . Smoking status: Never Smoker  . Smokeless tobacco: Never Used  Substance and Sexual Activity  . Alcohol use: No  . Drug use: No  . Sexual activity: Never  Lifestyle  . Physical activity:    Days per week: Not on file    Minutes per session: Not on file  . Stress: Not on file  Relationships  . Social connections:    Talks on phone: Not on file    Gets together: Not on file    Attends religious service: Not on file    Active member of club or organization: Not on file    Attends meetings of clubs or organizations: Not on file    Relationship status: Not on file  Other Topics  Concern  . Not on file  Social History Narrative  . Not on file     Family History:  The patient's family history includes Cancer in her sister; Heart attack in her mother; Heart disease in her mother and sister; Hypertension in her mother; Stroke in her sister.   ROS:   Please see the history of present illness.    Review of Systems  Constitution: Negative.  HENT: Negative.   Eyes: Negative.   Cardiovascular: Negative.   Respiratory: Negative.   Hematologic/Lymphatic: Negative.   Musculoskeletal: Positive for arthritis, back pain and joint pain.  Gastrointestinal: Negative.   Genitourinary: Negative.   Neurological: Negative.    All other systems reviewed and are negative.   PHYSICAL EXAM:   VS:  BP 130/78   Pulse 74   Ht 5' 2.5" (1.588 m)   Wt (!) 303 lb 1.9 oz (137.5 kg)   SpO2 97%   BMI 54.56 kg/m   Physical Exam  GEN: Obese, in no acute distress  Neck: no JVD, carotid bruits, or masses Cardiac:RRR; no murmurs, rubs, or gallops  Respiratory:  clear to auscultation bilaterally, normal work of breathing GI: soft, nontender, nondistended, + BS Ext: without cyanosis, clubbing, or edema, Good distal pulses bilaterally Neuro:  Alert and Oriented x 3 Psych: euthymic mood, full affect  Wt Readings from Last 3 Encounters:  07/27/18 (!) 303 lb 1.9 oz (137.5 kg)  06/15/18 299 lb 12.8 oz (136 kg)  06/02/18 299 lb 12.8 oz (136 kg)      Studies/Labs Reviewed:   EKG:  EKG is ordered today.  The ekg ordered today demonstrates normal sinus rhythm with LVH, poor R wave progression anteriorly, no acute change  Recent Labs: 09/14/2017: NT-Pro BNP 206 06/15/2018: ALT 18; BUN 17; Creatinine, Ser 1.37; Hemoglobin 12.7; Platelets 191; Potassium 4.5; Sodium 139   Lipid Panel    Component Value Date/Time   CHOL 189 06/15/2018 1534   TRIG 89 06/15/2018 1534   HDL 51 06/15/2018 1534   CHOLHDL 3.7 06/15/2018 1534   CHOLHDL 3.3 12/21/2015 0400   VLDL 15 12/21/2015 0400    LDLCALC 120 (H) 06/15/2018 1534    Additional studies/ records that were reviewed today include:   Cardiac cath 12/20/2015 Conclusion      Ost 2nd Diag lesion, 40% stenosed.  Ost LAD to Mid LAD lesion, 25% stenosed.  Prox Cx to Mid Cx lesion, 40% stenosed.  Mid LAD lesion, 99% stenosed. Post intervention, there is a 0% residual stenosis.        Echo 12/21/2015 LV EF: 55% -   60%   ------------------------------------------------------------------- Indications:      CAD  of native vessels 414.01.   ------------------------------------------------------------------- History:   PMH:   Coronary artery disease.  Risk factors: Hypertension. Diabetes mellitus. Morbidly obese.   ------------------------------------------------------------------- Study Conclusions   - Left ventricle: The cavity size was normal. Wall thickness was   increased in a pattern of mild LVH. Systolic function was normal.   The estimated ejection fraction was in the range of 55% to 60%.   Wall motion was normal; there were no regional wall motion   abnormalities. Doppler parameters are consistent with abnormal   left ventricular relaxation (grade 1 diastolic dysfunction). - Mitral valve: Calcified annulus.   Impressions:   - Technically difficult; definity used; normal LV systolic   function; grade 1 diastolic dysfunction.      ASSESSMENT:    1. Coronary artery disease involving native coronary artery of native heart without angina pectoris   2. Essential hypertension   3. Other hyperlipidemia   4. Type 2 diabetes mellitus without complication, without long-term current use of insulin (HCC)   5. Chronic diastolic CHF (congestive heart failure) (HCC)   6. Chronic kidney disease (CKD), stage III (moderate) (HCC)      PLAN:  In order of problems listed above:  CAD status post DES to the LAD 2017, normal LVEF on 2D echo 2017 with grade 1 DD-no recurrent chest pain.  Doing well.  Follow-up with Dr.  Delton See in 6 months. Essential hypertension blood pressure well controlled.  Hyperlipidemia LDL 120 06/15/2018 on Lipitor 80 mg daily.  We will add Zetia 10 mg once daily and follow-up lipids and LFTs in 3 months.  DM type II managed by PCP  Chronic diastolic CHF well-controlled on Lasix 40 mg once daily creatinine 1.37 in October stable  CKD stage III creatinine stable in October  Medication Adjustments/Labs and Tests Ordered: Current medicines are reviewed at length with the patient today.  Concerns regarding medicines are outlined above.  Medication changes, Labs and Tests ordered today are listed in the Patient Instructions below. Patient Instructions  Medication Instructions:  Your physician has recommended you make the following change in your medication:   START: ezetimibe (zetia) 10 mg tablet: Take 1 tablet by mouth once a day  If you need a refill on your cardiac medications before your next appointment, please call your pharmacy.   Lab work: Your physician recommends that you return for a FASTING lipid profile and liver function panel in 3 months   If you have labs (blood work) drawn today and your tests are completely normal, you will receive your results only by: Marland Kitchen MyChart Message (if you have MyChart) OR . A paper copy in the mail If you have any lab test that is abnormal or we need to change your treatment, we will call you to review the results.  Testing/Procedures: None ordered  Follow-Up: At [facility], you and your health needs are our priority.  As part of our continuing mission to provide you with exceptional heart care, we have created designated Provider Care Teams.  These Care Teams include your primary Cardiologist (physician) and Advanced Practice Providers (APPs -  Physician Assistants and Nurse Practitioners) who all work together to provide you with the care you need, when you need it. . You will need a follow up appointment in 6 months.  Please call  our office 2 months in advance to schedule this appointment.  You may see Tobias Alexander, MD or one of the following Advanced Practice Providers on your designated Care Team:   .  Brittainy Simmons, PA-C . Dayna Dunn, PA-C . Jacolyn Reedy, PA-C  Any Other Special Instructions Will Be Listed Below (If Applicable).       Elson Clan, PA-C  07/27/2018 10:07 AM    [facility] [address] [place], [place], [place]  [address] Phone: [phone]; Fax: [phone]

## 2018-07-27 ENCOUNTER — Encounter: Payer: Self-pay | Admitting: Physician Assistant

## 2018-07-27 ENCOUNTER — Ambulatory Visit (INDEPENDENT_AMBULATORY_CARE_PROVIDER_SITE_OTHER): Payer: Medicare Other | Admitting: Physician Assistant

## 2018-07-27 VITALS — BP 130/78 | HR 74 | Ht 62.5 in | Wt 303.1 lb

## 2018-07-27 DIAGNOSIS — I10 Essential (primary) hypertension: Secondary | ICD-10-CM

## 2018-07-27 DIAGNOSIS — E119 Type 2 diabetes mellitus without complications: Secondary | ICD-10-CM

## 2018-07-27 DIAGNOSIS — I5032 Chronic diastolic (congestive) heart failure: Secondary | ICD-10-CM

## 2018-07-27 DIAGNOSIS — I2510 Atherosclerotic heart disease of native coronary artery without angina pectoris: Secondary | ICD-10-CM

## 2018-07-27 DIAGNOSIS — N183 Chronic kidney disease, stage 3 unspecified: Secondary | ICD-10-CM

## 2018-07-27 DIAGNOSIS — E7849 Other hyperlipidemia: Secondary | ICD-10-CM | POA: Diagnosis not present

## 2018-07-27 MED ORDER — EZETIMIBE 10 MG PO TABS: 10.0000 mg | ORAL_TABLET | Freq: Every day | ORAL | 3 refills | Status: DC

## 2018-07-27 NOTE — Patient Instructions (Addendum)
Medication Instructions:  Your physician has recommended you make the following change in your medication:   START: ezetimibe (zetia) 10 mg tablet: Take 1 tablet by mouth once a day  If you need a refill on your cardiac medications before your next appointment, please call your pharmacy.   Lab work: Your physician recommends that you return for a FASTING lipid profile and liver function panel in 3 months   If you have labs (blood work) drawn today and your tests are completely normal, you will receive your results only by: Marland Kitchen. MyChart Message (if you have MyChart) OR . A paper copy in the mail If you have any lab test that is abnormal or we need to change your treatment, we will call you to review the results.  Testing/Procedures: None ordered  Follow-Up: At [facility], you and your health needs are our priority.  As part of our continuing mission to provide you with exceptional heart care, we have created designated Provider Care Teams.  These Care Teams include your primary Cardiologist (physician) and Advanced Practice Providers (APPs -  Physician Assistants and Nurse Practitioners) who all work together to provide you with the care you need, when you need it. . You will need a follow up appointment in 6 months.  Please call our office 2 months in advance to schedule this appointment.  You may see Tobias AlexanderKatarina Nelson, MD or one of the following Advanced Practice Providers on your designated Care Team:   . Robbie LisBrittainy Simmons, PA-C . Dayna Dunn, PA-C . Jacolyn ReedyMichele Lenze, PA-C  Any Other Special Instructions Will Be Listed Below (If Applicable).

## 2018-08-01 ENCOUNTER — Other Ambulatory Visit: Payer: Self-pay | Admitting: Internal Medicine

## 2018-08-10 ENCOUNTER — Ambulatory Visit (INDEPENDENT_AMBULATORY_CARE_PROVIDER_SITE_OTHER): Payer: Medicare Other

## 2018-08-10 VITALS — BP 132/62 | HR 65 | Temp 97.4°F | Ht 63.0 in | Wt 301.0 lb

## 2018-08-10 DIAGNOSIS — Z0000 Encounter for general adult medical examination without abnormal findings: Secondary | ICD-10-CM

## 2018-08-10 MED ORDER — TETANUS-DIPHTH-ACELL PERTUSSIS 5-2.5-18.5 LF-MCG/0.5 IM SUSP: 0.5000 mL | Freq: Once | INTRAMUSCULAR | 0 refills | Status: AC

## 2018-08-10 MED ORDER — ZOSTER VAC RECOMB ADJUVANTED 50 MCG/0.5ML IM SUSR: 0.5000 mL | Freq: Once | INTRAMUSCULAR | 1 refills | Status: AC

## 2018-08-10 MED ORDER — PNEUMOCOCCAL 13-VAL CONJ VACC IM SUSP: 0.5000 mL | INTRAMUSCULAR | 0 refills | Status: AC

## 2018-08-10 NOTE — Progress Notes (Signed)
Subjective:   Sarah Weaver is a 82 y.o. female who presents for an Initial Medicare Annual Wellness Visit.       Objective:    Today's Vitals   08/10/18 1303  BP: 132/62  Pulse: 65  Temp: (!) 97.4 F (36.3 C)  TempSrc: Oral  SpO2: 93%  Weight: (!) 301 lb (136.5 kg)  Height: 5\' 3"  (1.6 m)   Body mass index is 53.32 kg/m.  Advanced Directives 08/10/2018 12/20/2015  Does Patient Have a Medical Advance Directive? No No  Would patient like information on creating a medical advance directive? No - Patient declined No - patient declined information    Current Medications (verified) Outpatient Encounter Medications as of 08/10/2018  Medication Sig  . ACCU-CHEK SMARTVIEW test strip FOR USE WHEN CHECKING BLOOD SUGARS ONCE DAILY ALTERNATING MORNINGS AND EVENINGS BEFORE MEALS  . aspirin EC 81 MG tablet Take 81 mg by mouth daily.  Marland Kitchen atorvastatin (LIPITOR) 80 MG tablet TAKE 1 TABLET BY MOUTH DAILY AT 6PM  . brinzolamide (AZOPT) 1 % ophthalmic suspension Place 1 drop into both eyes 3 (three) times daily.   . Cholecalciferol (NATURAL VITAMIN D-3) 5000 units TABS Take 1 tablet by mouth daily.  Marland Kitchen ezetimibe (ZETIA) 10 MG tablet Take 1 tablet (10 mg total) by mouth daily.  . furosemide (LASIX) 40 MG tablet Take 1 tablet (40 mg total) by mouth daily.  . Insulin Degludec (TRESIBA) 100 UNIT/ML SOLN Inject into the skin. 7 units at bedtime daily  . Latanoprostene Bunod (VYZULTA) 0.024 % SOLN Apply 1 drop to eye daily.  Marland Kitchen losartan (COZAAR) 25 MG tablet TAKE 1 TABLET BY MOUTH EVERY DAY  . nebivolol (BYSTOLIC) 2.5 MG tablet Take 1 tablet (2.5 mg total) by mouth daily.  Marland Kitchen oxyCODONE-acetaminophen (PERCOCET/ROXICET) 5-325 MG tablet Take by mouth every 6 (six) hours as needed for severe pain.  . pneumococcal 13-valent conjugate vaccine (PREVNAR 13) SUSP injection Inject 0.5 mLs into the muscle tomorrow at 10 am for 1 dose.  . sitaGLIPtin (JANUVIA) 50 MG tablet Take 50 mg by mouth daily.  .  Tdap (BOOSTRIX) 5-2.5-18.5 LF-MCG/0.5 injection Inject 0.5 mLs into the muscle once for 1 dose.  . [DISCONTINUED] pneumococcal 13-valent conjugate vaccine (PREVNAR 13) SUSP injection Inject 0.5 mLs into the muscle tomorrow at 10 am.  . [DISCONTINUED] Tdap (BOOSTRIX) 5-2.5-18.5 LF-MCG/0.5 injection Inject 0.5 mLs into the muscle once.  . loratadine (CLARITIN) 10 MG tablet TAKE 1 TABLET BY MOUTH EVERY DAY  . Zoster Vaccine Adjuvanted [facility]) injection Inject 0.5 mLs into the muscle once for 1 dose.  . [DISCONTINUED] Zoster Vaccine Adjuvanted [facility]) injection Inject 0.5 mLs into the muscle once.   No facility-administered encounter medications on file as of 08/10/2018.     Allergies (verified) Carvedilol and Lisinopril   History: Past Medical History:  Diagnosis Date  . Arthritis    "all over"  . Chronic lower back pain   . Coronary artery disease   . Glaucoma   . Glaucoma of both eyes   . Hyperlipidemia   . Hypertension   . Type II diabetes mellitus (HCC)    Past Surgical History:  Procedure Laterality Date  . CARDIAC CATHETERIZATION N/A 12/20/2015   Procedure: Left Heart Cath and Coronary Angiography;  Surgeon: Runell Gess, MD;  Location: [facility] INVASIVE CV LAB;  Service: Cardiovascular;  Laterality: N/A;  . CARDIAC CATHETERIZATION N/A 12/20/2015   Procedure: Coronary Stent Intervention;  Surgeon: Runell Gess, MD;  Location: [facility] INVASIVE CV LAB;  Service: Cardiovascular;  Laterality: N/A;  mid LAD 2.5 x 20 Synergy  . CATARACT EXTRACTION W/ INTRAOCULAR LENS  IMPLANT, BILATERAL Bilateral   . CORONARY ANGIOPLASTY WITH STENT PLACEMENT  12/20/2015  . LAPAROSCOPIC CHOLECYSTECTOMY  04/23/11  . TONSILLECTOMY     Family History  Problem Relation Age of Onset  . Heart disease Mother   . Heart attack Mother   . Hypertension Mother   . Heart disease Sister   . Cancer Sister   . Stroke Sister    Social History   Socioeconomic History  . Marital status: Divorced    Spouse name:  Not on file  . Number of children: Not on file  . Years of education: Not on file  . Highest education level: Not on file  Occupational History  . Not on file  Social Needs  . Financial resource strain: Not hard at all  . Food insecurity:    Worry: Never true    Inability: Never true  . Transportation needs:    Medical: No    Non-medical: No  Tobacco Use  . Smoking status: Never Smoker  . Smokeless tobacco: Never Used  Substance and Sexual Activity  . Alcohol use: No  . Drug use: No  . Sexual activity: Never  Lifestyle  . Physical activity:    Days per week: 0 days    Minutes per session: 0 min  . Stress: Not at all  Relationships  . Social connections:    Talks on phone: More than three times a week    Gets together: More than three times a week    Attends religious service: Never    Active member of club or organization: No    Attends meetings of clubs or organizations: Never    Relationship status: Divorced  Other Topics Concern  . Not on file  Social History Narrative  . Not on file    Tobacco Counseling Counseling given: Not Answered   Clinical Intake:  Pre-visit preparation completed: No  Pain : No/denies pain     Diabetes: No  How often do you need to have someone help you when you read instructions, pamphlets, or other written materials from your doctor or pharmacy?: 1 - Never What is the last grade level you completed in school?: 10th grade  Interpreter Needed?: No  Information entered by :: Tyron Russell, Rn   Activities of Daily Living In your present state of health, do you have any difficulty performing the following activities: 08/10/2018  Hearing? N  Vision? N  Difficulty concentrating or making decisions? N  Walking or climbing stairs? N  Dressing or bathing? N  Doing errands, shopping? N  Preparing Food and eating ? N  Using the Toilet? N  In the past six months, have you accidently leaked urine? N  Do you have problems with  loss of bowel control? N  Managing your Medications? N  Managing your Finances? N  Housekeeping or managing your Housekeeping? N  Some recent data might be hidden     Immunizations and Health Maintenance Immunization History  Administered Date(s) Administered  . Influenza-Unspecified 06/15/2018  . Pneumococcal Polysaccharide-23 04/24/2011, 07/29/2017   Health Maintenance Due  Topic Date Due  . FOOT EXAM  10/09/1945  . OPHTHALMOLOGY EXAM  10/09/1945  . TETANUS/TDAP  10/08/1956  . DEXA SCAN  10/08/2002  . PNA vac Low Risk Adult (2 of 2 - PCV13) 07/29/2018    Patient Care Team: Dorothyann Peng, MD as PCP - General (  Internal Medicine) Lars MassonNelson, Katarina H, MD as PCP - Cardiology (Cardiology)  Indicate any recent Medical Services you may have received from other than [facility] providers in the past year (date may be approximate).     Assessment:   This is a routine wellness examination for Lib[place].  Hearing/Vision screen Hearing Screening Comments: Patient reports no issues with hearing Vision Screening Comments: Sees eye doctor annually  Dietary issues and exercise activities discussed: Current Exercise Habits: The patient does not participate in regular exercise at present, Exercise limited by: orthopedic condition(s)  Goals   None    Depression Screen PHQ 2/9 Scores 08/10/2018  PHQ - 2 Score 0    Fall Risk Fall Risk  08/10/2018  Falls in the past year? 0  Number falls in past yr: 0  Injury with Fall? 0    Is the patient's home free of loose throw rugs in walkways, pet beds, electrical cords, etc?   yes      Grab bars in the bathroom? no      Handrails on the stairs?   yes      Adequate lighting?   yes  Timed Get Up and Go Performed:  Cognitive Function:     6CIT Screen 08/10/2018  What Year? 0 points  What month? 0 points  What time? 0 points  Count back from 20 0 points  Months in reverse 0 points  Repeat phrase 0 points  Total Score 0    Screening  Tests Health Maintenance  Topic Date Due  . FOOT EXAM  10/09/1945  . OPHTHALMOLOGY EXAM  [birth year]09/1945  . TETANUS/TDAP  10/08/1956  . DEXA SCAN  10/08/2002  . PNA vac Low Risk Adult (2 of 2 - PCV13) 07/29/2018  . HEMOGLOBIN A1C  12/15/2018  . INFLUENZA VACCINE  Completed    Qualifies for Shingles Vaccine? Yes, educated and ordered to pharmacy  Cancer Screenings: Lung: Low Dose CT Chest recommended if Age 1-80 years, 30 pack-year currently smoking OR have quit w/in 15years. Patient does not qualify. Breast: Up to date on Mammogram? Yes   Up to date of Bone Density/Dexa? No, declined for now Colorectal: up to date  Additional Screenings:  Hepatitis C Screening: declined TDAP due: ordered to pharmacy [facility] Prevnar due: ordered to pharmacy   Plan:    I have personally reviewed and addressed the Medicare Annual Wellness questionnaire and have noted the following in the patient's chart:  A. Medical and social history B. Use of alcohol, tobacco or illicit drugs  C. Current medications and supplements D. Functional ability and status E.  Nutritional status F.  Physical activity G. Advance directives H. List of other physicians I.  Hospitalizations, surgeries, and ER visits in previous 12 months J.  Vitals K. Screenings to include hearing, vision, cognitive, depression L. Referrals and appointments - none  In addition, I have reviewed and discussed with patient certain preventive protocols, quality metrics, and best practice recommendations. A written personalized care plan for preventive services as well as general preventive health recommendations were provided to patient.  See attached scanned questionnaire for additional information.   Signed,   Tyron RussellSara Abrian Hanover, RN Nurse Health Advisor  Patient Concerns: none

## 2018-08-10 NOTE — Patient Instructions (Addendum)
Sarah Weaver , Thank you for taking time to come for your Medicare Wellness Visit. I appreciate your ongoing commitment to your health goals. Please review the following plan we discussed and let me know if I can assist you in the future.   Screening recommendations/referrals: Colonoscopy excluded, over age 80 Mammogram excluded, over age 80 Bone Density due, declined for now Recommended yearly ophthalmology/optometry visit for glaucoma screening and checkup Recommended yearly dental visit for hygiene and checkup  Vaccinations: Influenza vaccine up to date Pneumococcal vaccine possibly due- I will call and if you haven't had Prevnar I will order it for you Tdap vaccine due, ordered to pharmacy Shingles vaccine due, ordered to pharmacy    Advanced directives: Please bring us a copy once/if this is filled out  Conditions/risks identified: none  Next appointment: Please make Medicare Wellness visit for Nov 2020   Preventive Care 65 Years and Older, Female Preventive care refers to lifestyle choices and visits with your health care provider that can promote health and wellness. What does preventive care include?  A yearly physical exam. This is also called an annual well check.  Dental exams once or twice a year.  Routine eye exams. Ask your health care provider how often you should have your eyes checked.  Personal lifestyle choices, including:  Daily care of your teeth and gums.  Regular physical activity.  Eating a healthy diet.  Avoiding tobacco and drug use.  Limiting alcohol use.  Practicing safe sex.  Taking low-dose aspirin every day.  Taking vitamin and mineral supplements as recommended by your health care provider. What happens during an annual well check? The services and screenings done by your health care provider during your annual well check will depend on your age, overall health, lifestyle risk factors, and family history of disease. Counseling    Your health care provider may ask you questions about your:  Alcohol use.  Tobacco use.  Drug use.  Emotional well-being.  Home and relationship well-being.  Sexual activity.  Eating habits.  History of falls.  Memory and ability to understand (cognition).  Work and work Astronomerenvironment.  Reproductive health. Screening  You may have the following tests or measurements:  Height, weight, and BMI.  Blood pressure.  Lipid and cholesterol levels. These may be checked every 5 years, or more frequently if you are over 80 years old.  Skin check.  Lung cancer screening. You may have this screening every year starting at age 755 if you have a 30-pack-year history of smoking and currently smoke or have quit within the past 15 years.  Fecal occult blood test (FOBT) of the stool. You may have this test every year starting at age 80.  Flexible sigmoidoscopy or colonoscopy. You may have a sigmoidoscopy every 5 years or a colonoscopy every 10 years starting at age 80.  Hepatitis C blood test.  Hepatitis B blood test.  Sexually transmitted disease (STD) testing.  Diabetes screening. This is done by checking your blood sugar (glucose) after you have not eaten for a while (fasting). You may have this done every 1-3 years.  Bone density scan. This is done to screen for osteoporosis. You may have this done starting at age 80.  Mammogram. This may be done every 1-2 years. Talk to your health care provider about how often you should have regular mammograms. Talk with your health care provider about your test results, treatment options, and if necessary, the need for more tests. Vaccines  Your health care  provider may recommend certain vaccines, such as:  Influenza vaccine. This is recommended every year.  Tetanus, diphtheria, and acellular pertussis (Tdap, Td) vaccine. You may need a Td booster every 10 years.  Zoster vaccine. You may need this after age 47.  Pneumococcal 13-valent  conjugate (PCV13) vaccine. One dose is recommended after age 31.  Pneumococcal polysaccharide (PPSV23) vaccine. One dose is recommended after age 50. Talk to your health care provider about which screenings and vaccines you need and how often you need them. This information is not intended to replace advice given to you by your health care provider. Make sure you discuss any questions you have with your health care provider. Document Released: 09/28/2015 Document Revised: 05/21/2016 Document Reviewed: 07/03/2015 Elsevier Interactive Patient Education  2017 [place] Prevention in the Home Falls can cause injuries. They can happen to people of all ages. There are many things you can do to make your home safe and to help prevent falls. What can I do on the outside of my home?  Regularly fix the edges of walkways and driveways and fix any cracks.  Remove anything that might make you trip as you walk through a door, such as a raised step or threshold.  Trim any bushes or trees on the path to your home.  Use bright outdoor lighting.  Clear any walking paths of anything that might make someone trip, such as rocks or tools.  Regularly check to see if handrails are loose or broken. Make sure that both sides of any steps have handrails.  Any raised decks and porches should have guardrails on the edges.  Have any leaves, snow, or ice cleared regularly.  Use sand or salt on walking paths during winter.  Clean up any spills in your garage right away. This includes oil or grease spills. What can I do in the bathroom?  Use night lights.  Install grab bars by the toilet and in the tub and shower. Do not use towel bars as grab bars.  Use non-skid mats or decals in the tub or shower.  If you need to sit down in the shower, use a plastic, non-slip stool.  Keep the floor dry. Clean up any water that spills on the floor as soon as it happens.  Remove soap buildup in the tub or  shower regularly.  Attach bath mats securely with double-sided non-slip rug tape.  Do not have throw rugs and other things on the floor that can make you trip. What can I do in the bedroom?  Use night lights.  Make sure that you have a light by your bed that is easy to reach.  Do not use any sheets or blankets that are too big for your bed. They should not hang down onto the floor.  Have a firm chair that has side arms. You can use this for support while you get dressed.  Do not have throw rugs and other things on the floor that can make you trip. What can I do in the kitchen?  Clean up any spills right away.  Avoid walking on wet floors.  Keep items that you use a lot in easy-to-reach places.  If you need to reach something above you, use a strong step stool that has a grab bar.  Keep electrical cords out of the way.  Do not use floor polish or wax that makes floors slippery. If you must use wax, use non-skid floor wax.  Do not have throw  rugs and other things on the floor that can make you trip. What can I do with my stairs?  Do not leave any items on the stairs.  Make sure that there are handrails on both sides of the stairs and use them. Fix handrails that are broken or loose. Make sure that handrails are as long as the stairways.  Check any carpeting to make sure that it is firmly attached to the stairs. Fix any carpet that is loose or worn.  Avoid having throw rugs at the top or bottom of the stairs. If you do have throw rugs, attach them to the floor with carpet tape.  Make sure that you have a light switch at the top of the stairs and the bottom of the stairs. If you do not have them, ask someone to add them for you. What else can I do to help prevent falls?  Wear shoes that:  Do not have high heels.  Have rubber bottoms.  Are comfortable and fit you well.  Are closed at the toe. Do not wear sandals.  If you use a stepladder:  Make sure that it is fully  opened. Do not climb a closed stepladder.  Make sure that both sides of the stepladder are locked into place.  Ask someone to hold it for you, if possible.  Clearly mark and make sure that you can see:  Any grab bars or handrails.  First and last steps.  Where the edge of each step is.  Use tools that help you move around (mobility aids) if they are needed. These include:  Canes.  Walkers.  Scooters.  Crutches.  Turn on the lights when you go into a dark area. Replace any light bulbs as soon as they burn out.  Set up your furniture so you have a clear path. Avoid moving your furniture around.  If any of your floors are uneven, fix them.  If there are any pets around you, be aware of where they are.  Review your medicines with your doctor. Some medicines can make you feel dizzy. This can increase your chance of falling. Ask your doctor what other things that you can do to help prevent falls. This information is not intended to replace advice given to you by your health care provider. Make sure you discuss any questions you have with your health care provider. Document Released: 06/28/2009 Document Revised: 02/07/2016 Document Reviewed: 10/06/2014 Elsevier Interactive Patient Education  2017 [employer].

## 2018-08-23 ENCOUNTER — Telehealth: Payer: Self-pay | Admitting: *Deleted

## 2018-08-23 NOTE — Telephone Encounter (Signed)
[facility] Heart Failure program faxed over the pts current weights since last seen in our clinic on 07/27/18.  Pt weighed 303 lbs on 07/27/18 and is now at 300 lbs.  [facility] is up in weight, but according to our records, she is down 3 lbs since last seen.  Called the pt to ask if she is symptomatic or not, and pt states she feels just fine, and is having no lower extremity edema, sob, cp, or doe at this time. Pt states she has been asymptomatic since last seen in our office on 11/12 by Herma CarsonMichelle Lenze PA-C.  Pt states she is taking all her meds as prescribed. Pt states that [facility] she felt great and felt no need to fax her Cardiologist with this report. Advised the pt that being she is asymptomatic, she should continue her current med regimen, and maintain a low sodium diet.  Informed the pt that I will route this message to Dr Delton SeeNelson for her review, upon return to the office. Pt verbalized understanding and agrees with this plan.

## 2018-08-24 ENCOUNTER — Telehealth: Payer: Self-pay | Admitting: Cardiology

## 2018-08-24 NOTE — Telephone Encounter (Signed)
Called the pt about note sent to myself and Dr Del[facility], about pts weight increased 8 lbs in 8 days. We already received this fax yesterday, and pts weight was stable since we last saw her.  Pt states her weight this morning was 300 lbs, and when she last saw us on 11/12, it was 303 lbs, so she has dropped in weight. Pt states [facility] that she is feeling fine and has NO symptoms at all.  Pt states since [facility] me [facility], nothing has changed.  She states she still has NO SOB, DOE, CP, cough, lower extremity edema, or any other cardiac related  issues at this time. Pt states she is being compliant with taking all meds prescribed.  Pt states she is frustrated with this [facility], and she states she is going to "opt out of this program." Pt states she has a home health nurse who comes in and checks her periodically, and she had a visit with the pt last Friday, and received a very good report, with no acute findings.  Advised the pt to continue her current med regimen, continue maintaining a low sodium diet, wear compression stockings as needed or when she is active. Informed the pt that I will route this note to Dr Delton SeeNelson for her review, when she returns to the office.  Advised the pt to call our office with any acute changes with her heart failure. Pt verbalized understanding and agrees with this plan.

## 2018-08-24 NOTE — Telephone Encounter (Signed)
  [facility] heart failure clinic is sending over another fax regarding weight gain for Ms Sarah Weaver. Joni ReiningNicole, RN is stating that patient has gained 8.4 lbs in eight days.

## 2018-08-25 NOTE — Telephone Encounter (Signed)
No new changes since patient feels fine and weight is stable compared to last 2 OV notes

## 2018-08-25 NOTE — Telephone Encounter (Signed)
Spoke with the pt and endorsed to her that Dr Mayford Knifeurner (covering for Dr Delton SeeNelson), reviewed this message and advised that she continue her current med regimen, for her weight is stable and she is asymptomatic. Pt verbalized understanding and agrees with this plan.

## 2018-08-30 ENCOUNTER — Telehealth: Payer: Self-pay | Admitting: *Deleted

## 2018-08-30 NOTE — Telephone Encounter (Signed)
[facility] report to Dr Delton SeeNelson, to show pts trending weights.  Pt is currently down 6.8 lbs/4 days with no symptoms to report.  Pts current weight is around 298 lbs.  [facility].  Pt still compliant with taking all meds prescribed. Will forward to Dr Delton SeeNelson as a general FYI.

## 2018-09-07 ENCOUNTER — Other Ambulatory Visit: Payer: Self-pay | Admitting: Internal Medicine

## 2018-09-14 ENCOUNTER — Other Ambulatory Visit: Payer: Self-pay | Admitting: Internal Medicine

## 2018-09-16 ENCOUNTER — Encounter: Payer: Self-pay | Admitting: Internal Medicine

## 2018-09-16 ENCOUNTER — Ambulatory Visit (INDEPENDENT_AMBULATORY_CARE_PROVIDER_SITE_OTHER): Payer: Medicare Other | Admitting: Internal Medicine

## 2018-09-16 VITALS — BP 140/80 | HR 70 | Temp 98.0°F | Ht 63.0 in | Wt 302.8 lb

## 2018-09-16 DIAGNOSIS — M549 Dorsalgia, unspecified: Secondary | ICD-10-CM

## 2018-09-16 DIAGNOSIS — I10 Essential (primary) hypertension: Secondary | ICD-10-CM | POA: Diagnosis not present

## 2018-09-16 DIAGNOSIS — G8929 Other chronic pain: Secondary | ICD-10-CM | POA: Diagnosis not present

## 2018-09-16 DIAGNOSIS — E1165 Type 2 diabetes mellitus with hyperglycemia: Secondary | ICD-10-CM | POA: Diagnosis not present

## 2018-09-16 MED ORDER — INSULIN PEN NEEDLE 33G X 4 MM MISC: 7.0000 [IU] | Freq: Every evening | 1 refills | Status: AC

## 2018-09-16 MED ORDER — TRAMADOL-ACETAMINOPHEN 37.5-325 MG PO TABS: 1.0000 | ORAL_TABLET | Freq: Four times a day (QID) | ORAL | 0 refills | Status: DC | PRN

## 2018-09-16 MED ORDER — TETANUS-DIPHTH-ACELL PERTUSSIS 5-2-15.5 LF-MCG/0.5 IM SUSP: 0.5000 mL | Freq: Once | INTRAMUSCULAR | 0 refills | Status: AC

## 2018-09-16 NOTE — Progress Notes (Signed)
Subjective:     Patient ID: Sarah Weaver , female    DOB: 01-23-1936 , 83 y.o.   MRN: [id number]   Chief Complaint  Patient presents with  . Diabetes    patient took BS this morning and it was 151    HPI Pt is here for DM FU. She is here with her granddaughter.  Her glucose this am was 151, on average 120's fasting.  Has continued walking 90 min a week.  She did not get her TDAP, and shingles shot yet. Has Pneumonia 13 done in October. Per her granddaughter, pt's portions have decreased. Pt admits due to the holidays, has not been as good with her diet. But plans to get back on track.  Pt would like to have a refill of her Norco which she takes it on occasion for chronic back pain, and has had her bottle since 2018 and ran out a few months ago. Her prior provider at another practice used to prescribe it.  Past Medical History:  Diagnosis Date  . Arthritis    "all over"  . Chronic lower back pain   . Coronary artery disease   . Glaucoma   . Glaucoma of both eyes   . Hyperlipidemia   . Hypertension   . Type II diabetes mellitus (HCC)      Family History  Problem Relation Age of Onset  . Heart disease Mother   . Heart attack Mother   . Hypertension Mother   . Heart disease Sister   . Cancer Sister   . Stroke Sister      Current Outpatient Medications:  .  ACCU-CHEK SMARTVIEW test strip, FOR USE WHEN CHECKING BLOOD SUGARS ONCE DAILY ALTERNATING MORNINGS AND EVENINGS BEFORE MEALS, Disp: , Rfl: 3 .  aspirin EC 81 MG tablet, Take 81 mg by mouth daily., Disp: , Rfl:  .  atorvastatin (LIPITOR) 80 MG tablet, TAKE 1 TABLET BY MOUTH DAILY AT 6PM, Disp: 90 tablet, Rfl: 3 .  brinzolamide (AZOPT) 1 % ophthalmic suspension, Place 1 drop into both eyes 3 (three) times daily. , Disp: , Rfl:  .  Cholecalciferol (NATURAL VITAMIN D-3) 5000 units TABS, Take 1 tablet by mouth daily., Disp: , Rfl:  .  ezetimibe (ZETIA) 10 MG tablet, Take 1 tablet (10 mg total) by mouth daily., Disp:  90 tablet, Rfl: 3 .  furosemide (LASIX) 40 MG tablet, Take 1 tablet (40 mg total) by mouth daily., Disp: 90 tablet, Rfl: 3 .  Insulin Degludec 100 UNIT/ML SOLN, USE 7 UNITS AT BEDTIME DAILY, Disp: 15 vial, Rfl: 0 .  JANUVIA 100 MG tablet, TAKE 1 TABLET BY MOUTH ONCE DAILY, Disp: 90 tablet, Rfl: 0 .  Latanoprostene Bunod (VYZULTA) 0.024 % SOLN, Apply 1 drop to eye daily., Disp: , Rfl:  .  loratadine (CLARITIN) 10 MG tablet, TAKE 1 TABLET BY MOUTH EVERY DAY, Disp: 90 tablet, Rfl: 0 .  losartan (COZAAR) 25 MG tablet, TAKE 1 TABLET BY MOUTH EVERY DAY, Disp: 30 tablet, Rfl: 7 .  nebivolol (BYSTOLIC) 2.5 MG tablet, Take 1 tablet (2.5 mg total) by mouth daily., Disp: 90 tablet, Rfl: 1   Allergies  Allergen Reactions  . Carvedilol Cough    Pt reports causes her to cough  . Lisinopril Cough    REPORTS CAUSES DRY COUGH     Review of Systems  Constitutional: Negative for appetite change, diaphoresis and fever.  HENT: Negative.  Negative for postnasal drip and rhinorrhea.   Eyes: Negative for  visual disturbance.  Respiratory: Positive for cough and chest tightness. Negative for shortness of breath.        Heaviness feeling similar as when he saw cardiologist in October and her check up did not show anything new.   Cardiovascular: Positive for chest pain. Negative for palpitations and leg swelling.       Mild heaviness as noted above  Gastrointestinal: Positive for constipation. Negative for abdominal distention, abdominal pain, diarrhea and nausea.       Goes every 3-4 days which is her normal, and she uses mil of magnesia prn only.   Endocrine: Negative for polydipsia, polyphagia and polyuria.  Genitourinary: Negative for dysuria.  Skin: Negative for wound.  Neurological: Negative for dizziness and headaches.    Today's Vitals   09/16/18 0943  BP: 140/80  Pulse: 70  Temp: 98 F (36.7 C)  TempSrc: Oral  SpO2: 94%  Weight: (!) 302 lb 12.8 oz (137.3 kg)  Height: 5\' 3"  (1.6 m)   Body mass  index is 53.64 kg/m.   Objective:  Physical Exam   Constitutional: She is oriented to person, place, and time. She appears well-developed and well-nourished. No distress.  HENT:  Head: Normocephalic and atraumatic.  Right Ear: External ear normal.  Left Ear: External ear normal.  Nose: Nose normal.  Eyes: Conjunctivae are normal. Right eye exhibits no discharge. Left eye exhibits no discharge. No scleral icterus.  Neck: Neck supple. No thyromegaly present.  No carotid bruits bilaterally  Cardiovascular: Normal rate and regular rhythm.  No murmur heard. Pulmonary/Chest: Effort normal and breath sounds normal. No respiratory distress.  Musculoskeletal: Normal range of motion. She exhibits no edema.  Lymphadenopathy:    She has no cervical adenopathy.  Neurological: She is alert and oriented to person, place, and time.  Skin: Skin is warm and dry. Capillary refill takes less than 2 seconds. No rash noted. She is not diaphoretic.  Psychiatric: She has a normal mood and affect. Her behavior is normal. Judgment and thought content normal.  Nursing note reviewed.  Assessment And Plan:    1. Uncontrolled type 2 diabetes mellitus with hyperglycemia (HCC)- chronic. May continue same meds.   - Hemoglobin A1c  2. Essential hypertension- stable. May continue same meds.  - CBC no Diff - CMP14 + Anion Gap 3- Chronic back pain- I will have her try ultracet since she only uses is on rare occasions. I explained that at her age Awanda Mink is not safe. She is willing to try this.   Fu 3 months.  Kenzi Bardwell RODRIGUEZ-SOUTHWORTH, PA-C

## 2018-09-17 LAB — CBC
Hematocrit: 38.5 % (ref 34.0–46.6)
Hemoglobin: 12.3 g/dL (ref 11.1–15.9)
MCH: 27.6 pg (ref 26.6–33.0)
MCHC: 31.9 g/dL (ref 31.5–35.7)
MCV: 86 fL (ref 79–97)
Platelets: 196 10*3/uL (ref 150–450)
RBC: 4.46 x10E6/uL (ref 3.77–5.28)
RDW: 12.9 % (ref 12.3–15.4)
WBC: 4.7 10*3/uL (ref 3.4–10.8)

## 2018-09-17 LAB — CMP14 + ANION GAP
ALT: 18 IU/L (ref 0–32)
AST: 19 IU/L (ref 0–40)
Albumin/Globulin Ratio: 1.5 (ref 1.2–2.2)
Albumin: 4.3 g/dL (ref 3.5–4.7)
Alkaline Phosphatase: 94 IU/L (ref 39–117)
Anion Gap: 23 mmol/L — ABNORMAL HIGH (ref 10.0–18.0)
BUN/Creatinine Ratio: 14 (ref 12–28)
BUN: 20 mg/dL (ref 8–27)
Bilirubin Total: 0.4 mg/dL (ref 0.0–1.2)
CO2: 19 mmol/L — ABNORMAL LOW (ref 20–29)
Calcium: 10.2 mg/dL (ref 8.7–10.3)
Chloride: 105 mmol/L (ref 96–106)
Creatinine, Ser: 1.43 mg/dL — ABNORMAL HIGH (ref 0.57–1.00)
GFR calc Af Amer: 40 mL/min/{1.73_m2} — ABNORMAL LOW (ref >59)
GFR calc non Af Amer: 35 mL/min/{1.73_m2} — ABNORMAL LOW (ref >59)
Globulin, Total: 2.8 g/dL (ref 1.5–4.5)
Glucose: 232 mg/dL — ABNORMAL HIGH (ref 65–99)
Potassium: 5.4 mmol/L — ABNORMAL HIGH (ref 3.5–5.2)
Sodium: 147 mmol/L — ABNORMAL HIGH (ref 134–144)
Total Protein: 7.1 g/dL (ref 6.0–8.5)

## 2018-09-17 LAB — HEMOGLOBIN A1C
Est. average glucose Bld gHb Est-mCnc: 186 mg/dL
Hgb A1c MFr Bld: 8.1 % — ABNORMAL HIGH (ref 4.8–5.6)

## 2018-09-21 ENCOUNTER — Other Ambulatory Visit: Payer: Self-pay | Admitting: Internal Medicine

## 2018-09-21 DIAGNOSIS — E875 Hyperkalemia: Secondary | ICD-10-CM

## 2018-09-21 DIAGNOSIS — N289 Disorder of kidney and ureter, unspecified: Secondary | ICD-10-CM

## 2018-09-21 NOTE — Progress Notes (Signed)
Repeat lab ordered due to hyperkalemia

## 2018-10-23 ENCOUNTER — Other Ambulatory Visit: Payer: Self-pay | Admitting: Cardiology

## 2018-10-28 ENCOUNTER — Other Ambulatory Visit: Payer: Medicare Other | Admitting: *Deleted

## 2018-10-28 DIAGNOSIS — I2510 Atherosclerotic heart disease of native coronary artery without angina pectoris: Secondary | ICD-10-CM

## 2018-10-28 DIAGNOSIS — E7849 Other hyperlipidemia: Secondary | ICD-10-CM | POA: Diagnosis not present

## 2018-10-28 LAB — HEPATIC FUNCTION PANEL
ALT: 18 IU/L (ref 0–32)
AST: 19 IU/L (ref 0–40)
Albumin: 4.1 g/dL (ref 3.6–4.6)
Alkaline Phosphatase: 87 IU/L (ref 39–117)
Bilirubin Total: 0.5 mg/dL (ref 0.0–1.2)
Bilirubin, Direct: 0.17 mg/dL (ref 0.00–0.40)
Total Protein: 6.9 g/dL (ref 6.0–8.5)

## 2018-10-28 LAB — LIPID PANEL
Chol/HDL Ratio: 2.5 ratio (ref 0.0–4.4)
Cholesterol, Total: 112 mg/dL (ref 100–199)
HDL: 45 mg/dL (ref >39)
LDL Calculated: 53 mg/dL (ref 0–99)
Triglycerides: 70 mg/dL (ref 0–149)
VLDL Cholesterol Cal: 14 mg/dL (ref 5–40)

## 2018-11-30 DIAGNOSIS — N183 Chronic kidney disease, stage 3 (moderate): Secondary | ICD-10-CM | POA: Diagnosis not present

## 2018-12-16 ENCOUNTER — Ambulatory Visit: Payer: Medicare Other | Admitting: Internal Medicine

## 2018-12-16 ENCOUNTER — Other Ambulatory Visit: Payer: Self-pay

## 2018-12-16 DIAGNOSIS — E119 Type 2 diabetes mellitus without complications: Secondary | ICD-10-CM

## 2018-12-16 MED ORDER — SITAGLIPTIN PHOSPHATE 100 MG PO TABS: 100.0000 mg | ORAL_TABLET | Freq: Every day | ORAL | 0 refills | Status: DC

## 2018-12-28 ENCOUNTER — Other Ambulatory Visit: Payer: Self-pay | Admitting: Cardiology

## 2019-01-27 ENCOUNTER — Other Ambulatory Visit: Payer: Self-pay

## 2019-01-27 ENCOUNTER — Encounter: Payer: Self-pay | Admitting: Nurse Practitioner

## 2019-01-27 ENCOUNTER — Ambulatory Visit (INDEPENDENT_AMBULATORY_CARE_PROVIDER_SITE_OTHER): Payer: Medicare Other | Admitting: Nurse Practitioner

## 2019-01-27 ENCOUNTER — Ambulatory Visit: Payer: Medicare Other | Admitting: Internal Medicine

## 2019-01-27 VITALS — BP 130/82 | HR 75 | Temp 98.1°F | Ht 62.4 in | Wt 304.0 lb

## 2019-01-27 DIAGNOSIS — M545 Low back pain, unspecified: Secondary | ICD-10-CM

## 2019-01-27 DIAGNOSIS — E782 Mixed hyperlipidemia: Secondary | ICD-10-CM

## 2019-01-27 DIAGNOSIS — E1165 Type 2 diabetes mellitus with hyperglycemia: Secondary | ICD-10-CM | POA: Diagnosis not present

## 2019-01-27 DIAGNOSIS — G8929 Other chronic pain: Secondary | ICD-10-CM

## 2019-01-27 DIAGNOSIS — I10 Essential (primary) hypertension: Secondary | ICD-10-CM

## 2019-01-27 MED ORDER — TRAMADOL-ACETAMINOPHEN 37.5-325 MG PO TABS: 1.0000 | ORAL_TABLET | Freq: Four times a day (QID) | ORAL | 0 refills | Status: DC | PRN

## 2019-01-27 NOTE — Progress Notes (Signed)
Subjective:     Patient ID: Sarah Weaver , female    DOB: 1936-03-10 , 83 y.o.   MRN: [id number]   Chief Complaint  Patient presents with  . Diabetes    HPI  Hyperlipidemia - doing okay  Diabetes  She presents for her follow-up diabetic visit. She has type 2 diabetes mellitus. There are no hypoglycemic associated symptoms. Pertinent negatives for hypoglycemia include no dizziness or headaches. There are no diabetic associated symptoms. Pertinent negatives for diabetes include no chest pain and no fatigue. She is following a generally healthy diet. When asked about meal planning, she reported none. She has not had a previous visit with a dietitian. (Blood sugar 130- 156, today 188.  Ate cube steak and spinach, turnip greens and rice.  ) An ACE inhibitor/angiotensin II receptor blocker is being taken. She does not see a podiatrist.Eye exam is not current.  Hypertension  This is a chronic problem. The current episode started more than 1 year ago. The problem is unchanged. The problem is controlled. Pertinent negatives include no anxiety, chest pain, headaches or palpitations. There are no associated agents to hypertension. Risk factors for coronary artery disease include obesity and sedentary lifestyle. Past treatments include angiotensin blockers and diuretics. There are no compliance problems.  There is no history of angina. There is no history of chronic renal disease.     Past Medical History:  Diagnosis Date  . Arthritis    "all over"  . Chronic lower back pain   . Coronary artery disease   . Glaucoma   . Glaucoma of both eyes   . Hyperlipidemia   . Hypertension   . Type II diabetes mellitus (HCC)      Family History  Problem Relation Age of Onset  . Heart disease Mother   . Heart attack Mother   . Hypertension Mother   . Heart disease Sister   . Cancer Sister   . Stroke Sister      Current Outpatient Medications:  .  ACCU-CHEK SMARTVIEW test strip, FOR USE  WHEN CHECKING BLOOD SUGARS ONCE DAILY ALTERNATING MORNINGS AND EVENINGS BEFORE MEALS, Disp: , Rfl: 3 .  aspirin EC 81 MG tablet, Take 81 mg by mouth daily., Disp: , Rfl:  .  atorvastatin (LIPITOR) 80 MG tablet, TAKE 1 TABLET BY MOUTH DAILY AT 6PM, Disp: 90 tablet, Rfl: 3 .  brinzolamide (AZOPT) 1 % ophthalmic suspension, Place 1 drop into both eyes 3 (three) times daily. , Disp: , Rfl:  .  BYSTOLIC 2.5 MG tablet, TAKE 1 TABLET BY MOUTH EVERY DAY, Disp: 90 tablet, Rfl: 2 .  Cholecalciferol (NATURAL VITAMIN D-3) 5000 units TABS, Take 1 tablet by mouth daily., Disp: , Rfl:  .  furosemide (LASIX) 40 MG tablet, Take 1 tablet (40 mg total) by mouth daily., Disp: 90 tablet, Rfl: 3 .  Insulin Degludec 100 UNIT/ML SOLN, USE 7 UNITS AT BEDTIME DAILY, Disp: 15 vial, Rfl: 0 .  Latanoprostene Bunod (VYZULTA) 0.024 % SOLN, Apply 1 drop to eye daily., Disp: , Rfl:  .  loratadine (CLARITIN) 10 MG tablet, TAKE 1 TABLET BY MOUTH EVERY DAY, Disp: 90 tablet, Rfl: 0 .  losartan (COZAAR) 25 MG tablet, TAKE 1 TABLET BY MOUTH EVERY DAY, Disp: 90 tablet, Rfl: 1 .  sitaGLIPtin (JANUVIA) 100 MG tablet, Take 1 tablet (100 mg total) by mouth daily., Disp: 90 tablet, Rfl: 0 .  ezetimibe (ZETIA) 10 MG tablet, Take 1 tablet (10 mg total) by mouth daily., Disp:  90 tablet, Rfl: 3 .  traMADol-acetaminophen (ULTRACET) 37.5-325 MG tablet, Take 1 tablet by mouth every 6 (six) hours as needed for moderate pain (prn back pain). (Patient not taking: Reported on 01/27/2019), Disp: 30 tablet, Rfl: 0   Allergies  Allergen Reactions  . Carvedilol Cough    Pt reports causes her to cough  . Lisinopril Cough    REPORTS CAUSES DRY COUGH     Review of Systems  Constitutional: Negative.  Negative for fatigue.  Respiratory: Negative.   Cardiovascular: Negative.  Negative for chest pain, palpitations and leg swelling.  Musculoskeletal: Positive for back pain.  Neurological: Negative for dizziness and headaches.     Today's Vitals    01/27/19 1059  BP: 130/82  Pulse: 75  Temp: 98.1 F (36.7 C)  TempSrc: Oral  Weight: (!) 304 lb (137.9 kg)  Height: 5' 2.4" (1.585 m)  PainSc: 4   PainLoc: Back   Body mass index is 54.89 kg/m.   Objective:  Physical Exam Vitals signs reviewed.  Constitutional:      Appearance: Normal appearance.  Cardiovascular:     Rate and Rhythm: Normal rate and regular rhythm.     Pulses: Normal pulses.     Heart sounds: Normal heart sounds. No murmur.  Pulmonary:     Effort: Pulmonary effort is normal.     Breath sounds: Normal breath sounds.  Neurological:     General: No focal deficit present.     Mental Status: She is oriented to person, place, and time.  Psychiatric:        Mood and Affect: Mood normal.        Behavior: Behavior normal.        Thought Content: Thought content normal.        Judgment: Judgment normal.         Assessment And Plan:     1. Chronic bilateral low back pain without sciatica  Will treat with limited supply of tramadol  Encouraged to stretch as tolerated.   - traMADol-acetaminophen (ULTRACET) 37.5-325 MG tablet; Take 1 tablet by mouth every 6 (six) hours as needed for moderate pain (prn back pain).  Dispense: 30 tablet; Refill: 0  2. Uncontrolled type 2 diabetes mellitus with hyperglycemia (HCC) Chronic, poorly controlled Continue with current medications Encouraged to limit intake of sugary foods and drinks Encouraged to increase physical activity to 150 minutes per week in the chair - Hemoglobin A1c  3. Hypertension, essential, benign . B/P is fairly controlled.  . The importance of regular exercise and dietary modification was stressed to the patient.   5. Moderate mixed hyperlipidemia not requiring statin therapy  Chronic  Encouraged to limit intake of fried and fatty foods - Lipid Profile - CMP14 + Anion Gap    Sarah FeltsJanece Petrita Blunck, FNP    THE PATIENT IS ENCOURAGED TO PRACTICE SOCIAL DISTANCING DUE TO THE COVID-19 PANDEMIC.

## 2019-02-09 ENCOUNTER — Encounter: Payer: Self-pay | Admitting: Nurse Practitioner

## 2019-02-10 ENCOUNTER — Other Ambulatory Visit: Payer: Self-pay | Admitting: Cardiology

## 2019-02-14 ENCOUNTER — Other Ambulatory Visit: Payer: Self-pay | Admitting: Internal Medicine

## 2019-02-14 DIAGNOSIS — E119 Type 2 diabetes mellitus without complications: Secondary | ICD-10-CM

## 2019-02-23 DIAGNOSIS — N183 Chronic kidney disease, stage 3 (moderate): Secondary | ICD-10-CM | POA: Diagnosis not present

## 2019-02-24 DIAGNOSIS — H401131 Primary open-angle glaucoma, bilateral, mild stage: Secondary | ICD-10-CM | POA: Diagnosis not present

## 2019-02-24 DIAGNOSIS — E113291 Type 2 diabetes mellitus with mild nonproliferative diabetic retinopathy without macular edema, right eye: Secondary | ICD-10-CM | POA: Diagnosis not present

## 2019-02-24 DIAGNOSIS — E113292 Type 2 diabetes mellitus with mild nonproliferative diabetic retinopathy without macular edema, left eye: Secondary | ICD-10-CM | POA: Diagnosis not present

## 2019-02-28 ENCOUNTER — Other Ambulatory Visit: Payer: Self-pay | Admitting: Cardiology

## 2019-03-02 DIAGNOSIS — E1122 Type 2 diabetes mellitus with diabetic chronic kidney disease: Secondary | ICD-10-CM | POA: Diagnosis not present

## 2019-03-02 DIAGNOSIS — N183 Chronic kidney disease, stage 3 (moderate): Secondary | ICD-10-CM | POA: Diagnosis not present

## 2019-03-02 DIAGNOSIS — R809 Proteinuria, unspecified: Secondary | ICD-10-CM | POA: Diagnosis not present

## 2019-03-02 DIAGNOSIS — I129 Hypertensive chronic kidney disease with stage 1 through stage 4 chronic kidney disease, or unspecified chronic kidney disease: Secondary | ICD-10-CM | POA: Diagnosis not present

## 2019-03-21 ENCOUNTER — Encounter: Payer: Self-pay | Admitting: Physician Assistant

## 2019-03-21 NOTE — Progress Notes (Signed)
Virtual Visit via Telephone Note   This visit type was conducted due to national recommendations for restrictions regarding the COVID-19 Pandemic (e.g. social distancing) in an effort to limit this patient's exposure and mitigate transmission in our community.  Due to her co-morbid illnesses, this patient is at least at moderate risk for complications without adequate follow up.  This format is felt to be most appropriate for this patient at this time.  The patient did not have access to video technology/had technical difficulties with video requiring transitioning to audio format only (telephone).  All issues noted in this document were discussed and addressed.  No physical exam could be performed with this format.  Please refer to the patient's chart for her  consent to telehealth [facility].   Date:  03/24/2019   ID:  Sarah GurneyFreddie Mae Weaver, DOB 26-Jan-1936, MRN [id number]  Patient Location: Home Provider Location: Home  PCP:  Garey Hamodriguez-Southworth, Sylvia, PA-C  Cardiologist:  Tobias AlexanderKatarina Nelson, MD  Electrophysiologist:  None   Evaluation Performed:  Follow-Up Visit  Chief Complaint:  F/u CAD, CHF  History of Present Illness:    Sarah Weaver is a 83 y.o. female with CAD s/p DES to the LAD 12/2015, hypertension, HLD, glaucoma, DM type II, morbid obesity, CKD stage III, hyperkalemia on 09/2018 labs and chronic diastolic CHF who presents for 6 month follow-up. Last cath was in 2017 with PCI as above, otherwise showing 40% D2, 25% ost-mid LAD, 40% prox-mid Cx. 2D echo 12/2015 showed mild LVH, EF 55-60%, grade 1 DD. Last labs 10/2018 showed LDL 53, LFTs wnl, 09/2018 K 5.4, Cr 1.43, LFTs wnl, CBC wnl. She was due to have repeat labs in 01/2019 but was not happy about being stuck several times so left without having them completed.  She is seen virtually today via telephone. She lives with her daughter and they have been staying in throughout the pandemic. She denies any CP, SOB, edema,  orthopnea, PND. She has lost a few lb per our records but says she's surprised because she actually has been doing less lately. She is fearful of the virus. Her daughter has encouraged her to begin walking again in the parking lot where they live before people are up and out for the day and before it gets hot. She has no way to check her vitals. She occasionally snores and has fatigue but states these are not regular things. She reports compliance with meds.   The patient does not have symptoms concerning for COVID-19 infection (fever, chills, cough, or new shortness of breath).    Past Medical History:  Diagnosis Date  . Arthritis    "all over"  . Chronic diastolic CHF (congestive heart failure) (HCC)   . Chronic lower back pain   . CKD (chronic kidney disease), stage III (HCC)   . Coronary artery disease    a. status post DES to the LAD 12/2015.  . Glaucoma   . Glaucoma of both eyes   . Hyperkalemia   . Hyperlipidemia   . Hypertension   . Type II diabetes mellitus (HCC)    Past Surgical History:  Procedure Laterality Date  . CARDIAC CATHETERIZATION N/A 12/20/2015   Procedure: Left Heart Cath and Coronary Angiography;  Surgeon: Runell GessJonathan J Berry, MD;  [facility];  Service: Cardiovascular;  Laterality: N/A;  . CARDIAC CATHETERIZATION N/A 12/20/2015   Procedure: Coronary Stent Intervention;  Surgeon: Runell GessJonathan J Berry, MD;  Location: [facility]VASIVE CV LAB;  Service: Cardiovascular;  Laterality: N/A;  mid LAD 2.5 x 20 Synergy  . CATARACT EXTRACTION W/ INTRAOCULAR LENS  IMPLANT, BILATERAL Bilateral   . CORONARY ANGIOPLASTY WITH STENT PLACEMENT  12/20/2015  . LAPAROSCOPIC CHOLECYSTECTOMY  04/23/11  . TONSILLECTOMY       Current Meds  Medication Sig  . ACCU-CHEK SMARTVIEW test strip FOR USE WHEN CHECKING BLOOD SUGARS ONCE DAILY ALTERNATING MORNINGS AND EVENINGS BEFORE MEALS  . aspirin EC 81 MG tablet Take 81 mg by mouth daily.  Marland Kitchen. atorvastatin (LIPITOR) 80 MG tablet TAKE 1 TABLET  BY MOUTH DAILY AT 6PM  . brinzolamide (AZOPT) 1 % ophthalmic suspension Place 1 drop into both eyes 3 (three) times daily.   Marland Kitchen. BYSTOLIC 2.5 MG tablet TAKE 1 TABLET BY MOUTH EVERY DAY  . Cholecalciferol (NATURAL VITAMIN D-3) 5000 units TABS Take 1 tablet by mouth daily.  . furosemide (LASIX) 40 MG tablet TAKE 1 TABLET BY MOUTH EVERY DAY  . Insulin Degludec 100 UNIT/ML SOLN USE 7 UNITS AT BEDTIME DAILY  . JANUVIA 100 MG tablet TAKE 1 TABLET BY MOUTH ONCE DAILY  . Latanoprostene Bunod (VYZULTA) 0.024 % SOLN Apply 1 drop to eye daily.  Marland Kitchen. losartan (COZAAR) 25 MG tablet TAKE 1 TABLET BY MOUTH EVERY DAY  . traMADol-acetaminophen (ULTRACET) 37.5-325 MG tablet Take 1 tablet by mouth every 6 (six) hours as needed for moderate pain (prn back pain).     Allergies:   Carvedilol and Lisinopril   Social History   Tobacco Use  . Smoking status: Never Smoker  . Smokeless tobacco: Never Used  Substance Use Topics  . Alcohol use: No  . Drug use: No     Family Hx: The patient's family history includes Cancer in her sister; Heart attack in her mother; Heart disease in her mother and sister; Hypertension in her mother; Stroke in her sister.  ROS:   Please see the history of present illness.    All other systems reviewed and are negative.   Prior CV studies:    Most recent pertinent cardiac studies are outlined above.  Labs/Other Tests and Data Reviewed:    EKG:  An ECG dated 07/27/18 was personally reviewed today and demonstrated:  NSR 74bpm, somewhat low voltage, nonspecific STT changes, possible prior anterior infarct  Recent Labs: 09/16/2018: BUN 20; Creatinine, Ser 1.43; Hemoglobin 12.3; Platelets 196; Potassium 5.4; Sodium 147 10/28/2018: ALT 18   Recent Lipid Panel Lab Results  Component Value Date/Time   CHOL 112 10/28/2018 09:12 AM   TRIG 70 10/28/2018 09:12 AM   HDL 45 10/28/2018 09:12 AM   CHOLHDL 2.5 10/28/2018 09:12 AM   CHOLHDL 3.3 12/21/2015 04:00 AM   LDLCALC 53 10/28/2018  09:12 AM    Wt Readings from Last 3 Encounters:  03/24/19 299 lb (135.6 kg)  01/27/19 (!) 304 lb (137.9 kg)  09/16/18 (!) 302 lb 12.8 oz (137.3 kg)     Objective:    Vital Signs:  Ht 5' 2.5" (1.588 m)   Wt 299 lb (135.6 kg)   BMI 53.82 kg/m    VS reviewed. General - elderly pleasant female in no acute distress Pulm - No labored breathing, no coughing during visit, no audible wheezing, speaking in full sentences Neuro - A+Ox3, no slurred speech, answers questions appropriately Psych - Pleasant affect     ASSESSMENT & PLAN:    1. Chronic diastolic CHF - patient denies any new symptoms related to this. She has previously had mixed messages about managing, as she has been encouraged  to drink a lot of fluid given her CKD. However, this may be counter productive given that she has h/o CHF requiring ongoing Lasix use. Reviewed 2g sodium restriction, 2L fluid restriction, daily weights with patient. Will arrange for [facility] to go out to her house to get vitals, AliveCor strip, labs, and physical exam. She does not have a BP cuff so we will request this as well. 2. CAD - continue ASA, BB, statin. Asymptomatic. 3. CKD stage III with hyperkalemia - needs recheck given high K and ongoing ARB use. She is agreeable to having [facility] come out. 4. Hyperlipidemia - continue statin, Zetia. Will get FLP/CMET when we get labs. 5. Cold intolerance - will get TSH and CBC when we obtain labs. 6. DM type 2 in obese - since PCP had wanted A1C back in 01/2019, will obtain to reduce number of total sticks necessary. We also had long discussion about her weight being a real threat to her life. She seems very nonchalant about this. We discussed testing for OSA but she is not interested in this.  COVID-19 Education: The signs and symptoms of COVID-19 were discussed with the patient and how to seek care for testing (follow up with PCP or arrange E-visit).  The importance of social distancing was  discussed today.  Time:   Today, I have spent 20 minutes with the patient with telehealth technology discussing the above problems.     Medication Adjustments/Labs and Tests Ordered: Current medicines are reviewed at length with the patient today.  Concerns regarding medicines are outlined above.   Disposition:  Follow up in 6 months with Dr. Meda Coffee.  Signed, Charlie Pitter, PA-C  03/24/2019 9:50 AM    [place]

## 2019-03-23 ENCOUNTER — Telehealth: Payer: Self-pay | Admitting: Physician Assistant

## 2019-03-23 NOTE — Telephone Encounter (Signed)

## 2019-03-24 ENCOUNTER — Telehealth: Payer: Self-pay | Admitting: Licensed Clinical Social Worker

## 2019-03-24 ENCOUNTER — Telehealth (INDEPENDENT_AMBULATORY_CARE_PROVIDER_SITE_OTHER): Payer: Medicaid Other | Admitting: Physician Assistant

## 2019-03-24 ENCOUNTER — Other Ambulatory Visit: Payer: Self-pay | Admitting: *Deleted

## 2019-03-24 ENCOUNTER — Other Ambulatory Visit: Payer: Self-pay

## 2019-03-24 ENCOUNTER — Telehealth: Payer: Self-pay | Admitting: *Deleted

## 2019-03-24 ENCOUNTER — Encounter: Payer: Self-pay | Admitting: Physician Assistant

## 2019-03-24 VITALS — Ht 62.5 in | Wt 299.0 lb

## 2019-03-24 DIAGNOSIS — E1169 Type 2 diabetes mellitus with other specified complication: Secondary | ICD-10-CM

## 2019-03-24 DIAGNOSIS — N183 Chronic kidney disease, stage 3 unspecified: Secondary | ICD-10-CM

## 2019-03-24 DIAGNOSIS — I5032 Chronic diastolic (congestive) heart failure: Secondary | ICD-10-CM

## 2019-03-24 DIAGNOSIS — R6889 Other general symptoms and signs: Secondary | ICD-10-CM

## 2019-03-24 DIAGNOSIS — E785 Hyperlipidemia, unspecified: Secondary | ICD-10-CM

## 2019-03-24 DIAGNOSIS — I2510 Atherosclerotic heart disease of native coronary artery without angina pectoris: Secondary | ICD-10-CM

## 2019-03-24 DIAGNOSIS — E119 Type 2 diabetes mellitus without complications: Secondary | ICD-10-CM

## 2019-03-24 DIAGNOSIS — E669 Obesity, unspecified: Secondary | ICD-10-CM

## 2019-03-24 NOTE — Telephone Encounter (Signed)
CSW referred to assist patient with obtaining a BP cuff. CSW contacted patient to inform cuff will be delivered to home. Patient grateful for support and assistance. CSW available as needed. Jackie Seann Genther, LCSW, CCSW-MCS [phone]  

## 2019-03-24 NOTE — Patient Instructions (Signed)
Medication Instructions:  Your physician recommends that you continue on your current medications as directed. Please refer to the Current Medication list given to you today.  If you need a refill on your cardiac medications before your next appointment, please call your pharmacy.   Lab work: [facility] WILL CALL TO COME AND DRAW THE FOLLOWING LABS: CMET, TSH, CBC, LIPID, & A1C  If you have labs (blood work) drawn today and your tests are completely normal, you will receive your results only by: Marland Kitchen MyChart Message (if you have MyChart) OR . A paper copy in the mail If you have any lab test that is abnormal or we need to change your treatment, we will call you to review the results.  Testing/Procedures: None ordered  Follow-Up: At [facility], you and your health needs are our priority.  As part of our continuing mission to provide you with exceptional heart care, we have created designated Provider Care Teams.  These Care Teams include your primary Cardiologist (physician) and Advanced Practice Providers (APPs -  Physician Assistants and Nurse Practitioners) who all work together to provide you with the care you need, when you need it. You will need a follow up appointment in 6 months.  Please call our office 2 months in advance to schedule this appointment.  You may see Ena Dawley, MD or one of the following Advanced Practice Providers on your designated Care Team:   Malcolm, PA-C Melina Copa, PA-C . Ermalinda Barrios, PA-C  Any Other Special Instructions Will Be Listed Below (If Applicable).

## 2019-03-24 NOTE — Telephone Encounter (Signed)
[facility] Home Visit Initial Request  Date of Request (DOR):  March 24, 2019  Requesting Provider:  Ronie Spiesayna Dunn, PA-C    Agency Requested:    [facility] Contact:  Alcide CleverMichelle Schmerge, NP [address] [place], [place] [address] Phone #:  [phone] Fax #:  [phone]  Patient Demographic Information: Name:  Sarah Weaver Age:  83 y.o.   DOB:  12/21/1935  MRN:  [id number]   Address:   [address] [address] [place] [address]   Phone Numbers:   Home Phone [phone]  Mobile [phone]     Emergency Contact Information on File:   Contact Information    Name Relation Home Work Mobile   Robinson,Frances Daughter [phone]     Aloha GellGilespi,Nancy Granddaughter [phone]     Katharina CaperHall,Ronald Son   [phone]      The above family members may be contacted for information on this patient (review DPR on file):  Yes    Patient Clinical Information:  Primary Care Provider:  Garey Hamodriguez-Southworth, Sylvia, PA-C  Primary Cardiologist:  Tobias AlexanderKatarina Nelson, MD  Primary Electrophysiologist:  None   Past Medical Hx: Sarah Weaver  has a past medical history of Arthritis, Chronic diastolic CHF (congestive heart failure) (HCC), Chronic lower back pain, CKD (chronic kidney disease), stage III (HCC), Coronary artery disease, Glaucoma, Glaucoma of both eyes, Hyperkalemia, Hyperlipidemia, Hypertension, and Type II diabetes mellitus (HCC).   Allergies: She is allergic to carvedilol and lisinopril.   Medications: Current Outpatient Medications on File Prior to Visit  Medication Sig  . ACCU-CHEK SMARTVIEW test strip FOR USE WHEN CHECKING BLOOD SUGARS ONCE DAILY ALTERNATING MORNINGS AND EVENINGS BEFORE MEALS  . aspirin EC 81 MG tablet Take 81 mg by mouth daily.  Marland Kitchen. atorvastatin (LIPITOR) 80 MG tablet TAKE 1 TABLET BY MOUTH DAILY AT 6PM  . brinzolamide (AZOPT) 1 % ophthalmic suspension Place 1 drop into both eyes 3 (three) times daily.   Marland Kitchen.  BYSTOLIC 2.5 MG tablet TAKE 1 TABLET BY MOUTH EVERY DAY  . Cholecalciferol (NATURAL VITAMIN D-3) 5000 units TABS Take 1 tablet by mouth daily.  Marland Kitchen. ezetimibe (ZETIA) 10 MG tablet Take 1 tablet (10 mg total) by mouth daily.  . furosemide (LASIX) 40 MG tablet TAKE 1 TABLET BY MOUTH EVERY DAY  . Insulin Degludec 100 UNIT/ML SOLN USE 7 UNITS AT BEDTIME DAILY  . JANUVIA 100 MG tablet TAKE 1 TABLET BY MOUTH ONCE DAILY  . Latanoprostene Bunod (VYZULTA) 0.024 % SOLN Apply 1 drop to eye daily.  Marland Kitchen. losartan (COZAAR) 25 MG tablet TAKE 1 TABLET BY MOUTH EVERY DAY  . traMADol-acetaminophen (ULTRACET) 37.5-325 MG tablet Take 1 tablet by mouth every 6 (six) hours as needed for moderate pain (prn back pain).   No current facility-administered medications on file prior to visit.      Social Hx: She  reports that she has never smoked. She has never used smokeless tobacco. She reports that she does not drink alcohol or use drugs.    Diagnosis/Reason for Visit:   - give pt BP cuff  - get labs - CMET (hyperkalemia, CAD), fasting lipid profile (CAD), TSH and CBC ( cold intolerance), A1C (diabetes in obese)  - alive cor rhythm strip  - vital signs  - med reconciliation  - physical exam  - CHF education   Services Requested:  Vital Signs (BP, Pulse, O2, Weight)  Medication Reconciliation  Rhythm Strip (AliveCor Device)  Labs:  CMET, FASTING LIPID, CBC, TSH,  A1V  # of Visits Needed/Frequency per Week: 1 VISIT ONLY

## 2019-03-28 ENCOUNTER — Telehealth: Payer: Self-pay | Admitting: Licensed Clinical Social Worker

## 2019-03-28 ENCOUNTER — Other Ambulatory Visit: Payer: Self-pay | Admitting: Internal Medicine

## 2019-03-28 DIAGNOSIS — E1169 Type 2 diabetes mellitus with other specified complication: Secondary | ICD-10-CM | POA: Diagnosis not present

## 2019-03-28 DIAGNOSIS — E785 Hyperlipidemia, unspecified: Secondary | ICD-10-CM | POA: Diagnosis not present

## 2019-03-28 DIAGNOSIS — N183 Chronic kidney disease, stage 3 (moderate): Secondary | ICD-10-CM | POA: Diagnosis not present

## 2019-03-28 DIAGNOSIS — I2510 Atherosclerotic heart disease of native coronary artery without angina pectoris: Secondary | ICD-10-CM | POA: Diagnosis not present

## 2019-03-28 NOTE — Telephone Encounter (Signed)
CSW received request from [facility] to assist patient with larger cuff as one that came with unit is too small. CSW ordered and will be delivered to patient;s home on Wednesday. CSW informed [facility] Pam Hinsaw. CSW available as needed. Raquel Sarna, [place], [place]

## 2019-03-28 NOTE — Progress Notes (Signed)
Reviewed [facility] visit. Await labs.

## 2019-03-29 LAB — LIPID PANEL
Chol/HDL Ratio: 2.4 ratio (ref 0.0–4.4)
Cholesterol, Total: 116 mg/dL (ref 100–199)
HDL: 49 mg/dL (ref >39)
LDL Calculated: 54 mg/dL (ref 0–99)
Triglycerides: 63 mg/dL (ref 0–149)
VLDL Cholesterol Cal: 13 mg/dL (ref 5–40)

## 2019-03-29 LAB — TSH: TSH: 4.67 u[IU]/mL — ABNORMAL HIGH (ref 0.450–4.500)

## 2019-03-29 LAB — HEMOGLOBIN A1C
Est. average glucose Bld gHb Est-mCnc: 203 mg/dL
Hgb A1c MFr Bld: 8.7 % — ABNORMAL HIGH (ref 4.8–5.6)

## 2019-03-29 LAB — COMPREHENSIVE METABOLIC PANEL
ALT: 21 IU/L (ref 0–32)
AST: 19 IU/L (ref 0–40)
Albumin/Globulin Ratio: 1.6 (ref 1.2–2.2)
Albumin: 4.1 g/dL (ref 3.6–4.6)
Alkaline Phosphatase: 93 IU/L (ref 39–117)
BUN/Creatinine Ratio: 16 (ref 12–28)
BUN: 19 mg/dL (ref 8–27)
Bilirubin Total: 0.4 mg/dL (ref 0.0–1.2)
CO2: 23 mmol/L (ref 20–29)
Calcium: 9.3 mg/dL (ref 8.7–10.3)
Chloride: 103 mmol/L (ref 96–106)
Creatinine, Ser: 1.2 mg/dL — ABNORMAL HIGH (ref 0.57–1.00)
GFR calc Af Amer: 49 mL/min/{1.73_m2} — ABNORMAL LOW (ref >59)
GFR calc non Af Amer: 42 mL/min/{1.73_m2} — ABNORMAL LOW (ref >59)
Globulin, Total: 2.6 g/dL (ref 1.5–4.5)
Glucose: 170 mg/dL — ABNORMAL HIGH (ref 65–99)
Potassium: 4.2 mmol/L (ref 3.5–5.2)
Sodium: 140 mmol/L (ref 134–144)
Total Protein: 6.7 g/dL (ref 6.0–8.5)

## 2019-03-29 LAB — CBC
Hematocrit: 36.8 % (ref 34.0–46.6)
Hemoglobin: 12.1 g/dL (ref 11.1–15.9)
MCH: 27.8 pg (ref 26.6–33.0)
MCHC: 32.9 g/dL (ref 31.5–35.7)
MCV: 84 fL (ref 79–97)
Platelets: 184 10*3/uL (ref 150–450)
RBC: 4.36 x10E6/uL (ref 3.77–5.28)
RDW: 13 % (ref 11.7–15.4)
WBC: 3.9 10*3/uL (ref 3.4–10.8)

## 2019-03-31 ENCOUNTER — Other Ambulatory Visit: Payer: Self-pay | Admitting: Internal Medicine

## 2019-03-31 MED ORDER — INSULIN DEGLUDEC 100 UNIT/ML ~~LOC~~ SOLN: 10.0000 [IU] | Freq: Every evening | SUBCUTANEOUS | 0 refills | Status: DC

## 2019-03-31 MED ORDER — LEVOTHYROXINE SODIUM 50 MCG PO TABS: 50.0000 ug | ORAL_TABLET | Freq: Every day | ORAL | 1 refills | Status: DC

## 2019-03-31 NOTE — Progress Notes (Signed)
I called pt and spoke with her.

## 2019-03-31 NOTE — Progress Notes (Signed)
I called pt to inform her about her thyroid results and HGBA1C. I increased her night time insulin to 10 U hs from 7 U, and placed her on synthroid 50 mcg qd.  She has apt in August to see me.

## 2019-04-01 ENCOUNTER — Encounter

## 2019-04-01 ENCOUNTER — Encounter: Payer: Self-pay | Admitting: Internal Medicine

## 2019-04-05 ENCOUNTER — Encounter: Payer: Self-pay | Admitting: *Deleted

## 2019-04-05 ENCOUNTER — Telehealth: Payer: Self-pay | Admitting: Physician Assistant

## 2019-04-05 NOTE — Telephone Encounter (Signed)
Pt returned my call.   She states that her last appt wit her pcp her bp was 130/82. When [facility] brought their cuff to her, it's been registering high,  182/89 170/84  Is the only 2 readings she had, she states she don't check it everyday. Pt is wondering if that bp machine is accurate?

## 2019-04-05 NOTE — Telephone Encounter (Signed)
Spoke with Glory Buff, with [facility]. She said pt's bp was 182/84 on Friday and upon recheck it was 130/98.  Call placed to pt, she was in the shower and I left a message for her to call back.

## 2019-04-05 NOTE — Progress Notes (Signed)
Call back from patient reference scale. It is working measuring on bathroom floor. Weight was 289 Did get thyroid med and is taking it Reports feeling well, no sob She is also recording her blood pressure and discussing with MD office and scheduling f/u appt in office Patient sts bp on 7/18 was 182/89 and on 7/20 170/84 and reports to be taking all meds.  She will take her logs and her machine to MD office when she follows up

## 2019-04-05 NOTE — Telephone Encounter (Signed)
Received [facility] fax from 7/20, can you please contact them to clarify BP? On 7/13 OV BP was normal. On 7/20 BP either looks like 107 or 187. Thank you. If it was elevated please call pt for updated readings. Mersadies Petree PA-C

## 2019-04-06 ENCOUNTER — Telehealth: Payer: Self-pay | Admitting: *Deleted

## 2019-04-06 MED ORDER — AMLODIPINE BESYLATE 5 MG PO TABS: 5.0000 mg | ORAL_TABLET | Freq: Every day | ORAL | 3 refills | Status: DC

## 2019-04-06 NOTE — Telephone Encounter (Signed)
Please add amlodipine 5mg  daily and have [facility] go out in 1 week for BP recheck. I also did not see her AliveCor strip in the previous faxes (requested on 7/9 document) so if we could get that as well, that would be great. Dayna Dunn PA-C

## 2019-04-06 NOTE — Telephone Encounter (Signed)
Spoke with Glory Buff with [facility]. She advised that they normally check pt's bp with [facility] cuff and the pt's if they have one to calibrate it.

## 2019-04-06 NOTE — Telephone Encounter (Signed)
Pt has been made aware to add Amlodipine 5 mg daily and that [facility] will come back in 1 week for bp recheck. Pt verbalized and agrees with the plan.  Also, spoke with Glory Buff re: AliveCor strip.  She stated the Nurse that went out at that particular day didn't have the device, but she will make sure they get it when they go back out for bp recheck.

## 2019-04-06 NOTE — Telephone Encounter (Signed)
I would wonder if it is accurate as well. Do you think you can contact [facility] to see if they can come out and see if it is calibrated correctly against a manual cuff? Thanks

## 2019-04-06 NOTE — Telephone Encounter (Signed)
[facility] Visit Follow Up Request   Date of Request ([place]):  April 06, 2019  Requesting Provider:  Melina Copa, PA-C    Agency Requested:    [facility] Contact:  Glory Buff, NP [address] [place], [place] [address] Phone #:  [phone] Fax #:  [phone]  Patient Demographic Information: Name:  Sarah Weaver Age:  83 y.o.   DOB:  Jan 30, 1936  MRN:  [id number]   Home visit progress note(s), lab results, telemetry strips, etc were reviewed.  Provider Recommendations: BP CHECK ALIVECOR RHYTHM STRIP  Follow up home services requested:  Vital Signs (BP, Pulse, O2, Weight)  Rhythm Strip (AliveCor Device)  DATE NEEDED: 04/13/19  NUMBER OF VISITS: 1

## 2019-04-08 NOTE — Progress Notes (Signed)
Met with patient this am Doing well No complaints, other than intermittent back pain Medication added to regimen: Amlodipine 5 mg took first dose today at 730a Has been tracking BP and weights 154/78 (7/23 169/79) 62 16 97.7  O2 sat 96-98% Weight this am 288, patient requested I help her do  Some edema noted to ankles  Taking all meds as prescribed Rhythm strip done  Encouraged patient to continuing her BP and weight management and taking meds as prescribed.

## 2019-04-11 ENCOUNTER — Other Ambulatory Visit: Payer: Self-pay

## 2019-04-11 ENCOUNTER — Telehealth: Payer: Self-pay | Admitting: Physician Assistant

## 2019-04-11 DIAGNOSIS — E119 Type 2 diabetes mellitus without complications: Secondary | ICD-10-CM

## 2019-04-11 MED ORDER — TRESIBA FLEXTOUCH 100 UNIT/ML ~~LOC~~ SOPN: 10.0000 [IU] | PEN_INJECTOR | Freq: Every day | SUBCUTANEOUS | 3 refills | Status: DC

## 2019-04-11 NOTE — Telephone Encounter (Signed)
[facility] visit reviewed. BP remains elevated in 150s/70s. Edema noted on exam. The patient had denied any edema at recent telemed visit but noted to have some edema to ankles on their visit. Please find out from patient if edema is at baseline for patient or is new. This will help guide [place] where to go next with BP. They also indicated rhythm strip was done but do not see included, in fax, please touch base for them to re-send strip. Tishanna Dunford PA-C

## 2019-04-11 NOTE — Progress Notes (Signed)
Reviewed - NSR

## 2019-04-11 NOTE — Telephone Encounter (Signed)
Pt has been made aware to monitor her bp, 3 hours after she takes her meds, and to keep a log and call us with those readings in a few days. Pt verbalized understanding.

## 2019-04-11 NOTE — Telephone Encounter (Signed)
Strip received shows NSR. [facility] will be faxing to chart.

## 2019-04-11 NOTE — Telephone Encounter (Signed)
OK! Let's have her continue the med for a few days and call in mid-week (Wed/Thurs) with updated readings at least 3 hours after taking her meds. Thanks

## 2019-04-11 NOTE — Telephone Encounter (Signed)
Called pt re edema noted around her ankles, pt states she has always had swelling around her ankles for along time, even before she had the heart attack. Pt said to let you know that she just started her new bp med on Saturday.

## 2019-04-23 ENCOUNTER — Other Ambulatory Visit: Payer: Self-pay | Admitting: Internal Medicine

## 2019-04-30 ENCOUNTER — Other Ambulatory Visit: Payer: Self-pay | Admitting: Internal Medicine

## 2019-05-05 ENCOUNTER — Other Ambulatory Visit: Payer: Self-pay

## 2019-05-05 ENCOUNTER — Encounter: Payer: Self-pay | Admitting: Internal Medicine

## 2019-05-05 ENCOUNTER — Ambulatory Visit (INDEPENDENT_AMBULATORY_CARE_PROVIDER_SITE_OTHER): Payer: Medicare Other | Admitting: Internal Medicine

## 2019-05-05 ENCOUNTER — Ambulatory Visit (INDEPENDENT_AMBULATORY_CARE_PROVIDER_SITE_OTHER): Payer: Medicare Other

## 2019-05-05 VITALS — BP 142/82 | HR 88 | Temp 98.3°F | Ht 62.4 in | Wt 299.2 lb

## 2019-05-05 DIAGNOSIS — Z0000 Encounter for general adult medical examination without abnormal findings: Secondary | ICD-10-CM | POA: Diagnosis not present

## 2019-05-05 DIAGNOSIS — E119 Type 2 diabetes mellitus without complications: Secondary | ICD-10-CM

## 2019-05-05 DIAGNOSIS — I10 Essential (primary) hypertension: Secondary | ICD-10-CM

## 2019-05-05 DIAGNOSIS — E782 Mixed hyperlipidemia: Secondary | ICD-10-CM | POA: Diagnosis not present

## 2019-05-05 DIAGNOSIS — E032 Hypothyroidism due to medicaments and other exogenous substances: Secondary | ICD-10-CM | POA: Diagnosis not present

## 2019-05-05 DIAGNOSIS — E1165 Type 2 diabetes mellitus with hyperglycemia: Secondary | ICD-10-CM | POA: Diagnosis not present

## 2019-05-05 LAB — POCT URINALYSIS DIPSTICK
Bilirubin, UA: NEGATIVE
Glucose, UA: NEGATIVE
Ketones, UA: NEGATIVE
Nitrite, UA: NEGATIVE
Protein, UA: POSITIVE — AB
Spec Grav, UA: >=1.03 — AB (ref 1.010–1.025)
Urobilinogen, UA: 0.2 E.U./dL
pH, UA: 5.5 (ref 5.0–8.0)

## 2019-05-05 NOTE — Progress Notes (Signed)
Subjective:   Sarah Weaver is a 84 y.o. female who presents for Medicare Annual (Subsequent) preventive examination.  Review of Systems:  n/a Cardiac Risk Factors include: advanced age (>29men, >73 women);diabetes mellitus;dyslipidemia;hypertension;sedentary lifestyle;obesity (BMI >30kg/m2)     Objective:     Vitals: BP (!) 142/82 (BP Location: Left Arm, Patient Position: Sitting, Cuff Size: Large)   Pulse 88   Temp 98.3 F (36.8 C) (Oral)   Ht 5' 2.4" (1.585 m)   Wt 299 lb 3.2 oz (135.7 kg)   BMI 54.03 kg/m   Body mass index is 54.03 kg/m.  Advanced Directives 05/05/2019 08/10/2018 12/20/2015  Does Patient Have a Medical Advance Directive? No No No  Would patient like information on creating a medical advance directive? - No - Patient declined No - patient declined information    Tobacco Social History   Tobacco Use  Smoking Status Never Smoker  Smokeless Tobacco Never Used     Counseling given: Not Answered   Clinical Intake:  Pre-visit preparation completed: Yes  Pain : No/denies pain     Nutritional Status: BMI > 30  Obese Nutritional Risks: None Diabetes: Yes CBG done?: No Did pt. bring in CBG monitor from home?: No  How often do you need to have someone help you when you read instructions, pamphlets, or other written materials from your doctor or pharmacy?: 1 - Never What is the last grade level you completed in school?: 10th grade  Interpreter Needed?: No  Information entered by :: NAllen LPN  Past Medical History:  Diagnosis Date  . Arthritis    "all over"  . Chronic diastolic CHF (congestive heart failure) ([place])   . Chronic lower back pain   . CKD (chronic kidney disease), stage III ([place])   . Coronary artery disease    a. status post DES to the LAD 12/2015.  . Glaucoma   . Glaucoma of both eyes   . Hyperkalemia   . Hyperlipidemia   . Hypertension   . Type II diabetes mellitus ([place])    Past Surgical History:  Procedure  Laterality Date  . CARDIAC CATHETERIZATION N/A 12/20/2015   Procedure: Left Heart Cath and Coronary Angiography;  Surgeon: Lorretta Harp, MD;  Location: [facility];  Service: Cardiovascular;  Laterality: N/A;  . CARDIAC CATHETERIZATION N/A 12/20/2015   Procedure: Coronary Stent Intervention;  Surgeon: Lorretta Harp, MD;  Location: [facility];  Service: Cardiovascular;  Laterality: N/A;  mid LAD 2.5 x 20 Synergy  . CATARACT EXTRACTION W/ INTRAOCULAR LENS  IMPLANT, BILATERAL Bilateral   . CORONARY ANGIOPLASTY WITH STENT PLACEMENT  12/20/2015  . LAPAROSCOPIC CHOLECYSTECTOMY  04/23/11  . TONSILLECTOMY     Family History  Problem Relation Age of Onset  . Heart disease Mother   . Heart attack Mother   . Hypertension Mother   . Heart disease Sister   . Cancer Sister   . Stroke Sister    Social History   Socioeconomic History  . Marital status: Divorced    Spouse name: Not on file  . Number of children: Not on file  . Years of education: Not on file  . Highest education level: Not on file  Occupational History  . Occupation: retired  Scientific laboratory technician  . Financial resource strain: Not hard at all  . Food insecurity    Worry: Never true    Inability: Never true  . Transportation needs    Medical: No    Non-medical: No  Tobacco Use  . Smoking status: Never Smoker  . Smokeless tobacco: Never Used  Substance and Sexual Activity  . Alcohol use: No  . Drug use: No  . Sexual activity: Not Currently  Lifestyle  . Physical activity    Days per week: 0 days    Minutes per session: 0 min  . Stress: Not at all  Relationships  . Social connections    Talks on phone: More than three times a week    Gets together: More than three times a week    Attends religious service: Never    Active member of club or organization: No    Attends meetings of clubs or organizations: Never    Relationship status: Divorced  Other Topics Concern  . Not on file  Social History Narrative  .  Not on file    Outpatient Encounter Medications as of 05/05/2019  Medication Sig  . amLODipine (NORVASC) 5 MG tablet Take 1 tablet (5 mg total) by mouth daily.  Marland Kitchen. aspirin EC 81 MG tablet Take 81 mg by mouth daily.  Marland Kitchen. atorvastatin (LIPITOR) 80 MG tablet TAKE 1 TABLET BY MOUTH DAILY AT 6PM  . BD PEN NEEDLE NANO U/F 32G X 4 MM MISC USE NIGHTLY  . brinzolamide (AZOPT) 1 % ophthalmic suspension Place 1 drop into both eyes 3 (three) times daily.   Marland Kitchen. BYSTOLIC 2.5 MG tablet TAKE 1 TABLET BY MOUTH EVERY DAY  . Cholecalciferol (NATURAL VITAMIN D-3) 5000 units TABS Take 1 tablet by mouth daily.  . furosemide (LASIX) 40 MG tablet TAKE 1 TABLET BY MOUTH EVERY DAY  . insulin degludec (TRESIBA FLEXTOUCH) 100 UNIT/ML SOPN FlexTouch Pen Inject 0.1 mLs (10 Units total) into the skin daily.  Marland Kitchen. JANUVIA 100 MG tablet TAKE 1 TABLET BY MOUTH ONCE DAILY  . Latanoprostene Bunod (VYZULTA) 0.024 % SOLN Apply 1 drop to eye daily.  Marland Kitchen. levothyroxine (SYNTHROID) 50 MCG tablet TAKE 1 TABLET BY MOUTH DAILY BEFORE BREAKFAST  . losartan (COZAAR) 25 MG tablet TAKE 1 TABLET BY MOUTH EVERY DAY  . ONETOUCH VERIO test strip CHECK BLOOD SUGARS ONCE DAILY  . traMADol-acetaminophen (ULTRACET) 37.5-325 MG tablet Take 1 tablet by mouth every 6 (six) hours as needed for moderate pain (prn back pain).  Marland Kitchen. ezetimibe (ZETIA) 10 MG tablet Take 1 tablet (10 mg total) by mouth daily.   No facility-administered encounter medications on file as of 05/05/2019.     Activities of Daily Living In your present state of health, do you have any difficulty performing the following activities: 05/05/2019 08/10/2018  Hearing? N N  Vision? N N  Difficulty concentrating or making decisions? N N  Walking or climbing stairs? N N  Dressing or bathing? N N  Doing errands, shopping? N N  Preparing Food and eating ? N N  Using the Toilet? N N  In the past six months, have you accidently leaked urine? N N  Do you have problems with loss of bowel control? N  N  Managing your Medications? N N  Managing your Finances? N N  Housekeeping or managing your Housekeeping? N N  Some recent data might be hidden    Patient Care Team: Rodriguez-Southworth, Viviana SimplerSylvia, PA-C as PCP - General (Internal Medicine) Lars MassonNelson, Katarina H, MD as PCP - Cardiology (Cardiology)    Assessment:   This is a routine wellness examination for Devine.  Exercise Activities and Dietary recommendations Current Exercise Habits: The patient does not participate in regular exercise at present  Goals    .  Patient Stated     05/05/2019, stay free of corona virus       Fall Risk Fall Risk  05/05/2019 01/27/2019 09/16/2018 08/10/2018  Falls in the past year? 0 0 0 0  Number falls in past yr: - - - 0  Injury with Fall? - - - 0  Risk for fall due to : Impaired mobility;Medication side effect - - -  Follow up Falls evaluation completed;Education provided;Falls prevention discussed - - -   Is the patient's home free of loose throw rugs in walkways, pet beds, electrical cords, etc?   yes      Grab bars in the bathroom? no      Handrails on the stairs?  n/a      Adequate lighting?   yes  Timed Get Up and Go performed: n/a  Depression Screen PHQ 2/9 Scores 05/05/2019 01/27/2019 09/16/2018 08/10/2018  PHQ - 2 Score 0 0 0 0  PHQ- 9 Score 1 - - -     Cognitive Function     6CIT Screen 05/05/2019 08/10/2018  What Year? 0 points 0 points  What month? 0 points 0 points  What time? 0 points 0 points  Count back from 20 0 points 0 points  Months in reverse 0 points 0 points  Repeat phrase 0 points 0 points  Total Score 0 0    Immunization History  Administered Date(s) Administered  . Influenza-Unspecified 06/15/2018  . Pneumococcal Polysaccharide-23 04/24/2011, 07/29/2017    Qualifies for Shingles Vaccine? yes  Screening Tests Health Maintenance  Topic Date Due  . FOOT EXAM  10/09/1947  . TETANUS/TDAP  10/08/1956  . DEXA SCAN  10/08/2002  . PNA vac Low Risk Adult (2 of  2 - PCV13) 07/29/2018  . OPHTHALMOLOGY EXAM  04/08/2019  . INFLUENZA VACCINE  04/16/2019  . HEMOGLOBIN A1C  09/28/2019    Cancer Screenings: Lung: Low Dose CT Chest recommended if Age 36-80 years, 30 pack-year currently smoking OR have quit w/in 15years. Patient does not qualify. Breast:  Up to date on Mammogram? Yes   Up to date of Bone Density/Dexa? Yes Colorectal: not required  Additional Screenings: : Hepatitis C Screening: n/a     Plan:    Patient goal is to stay clear of the corona virus.   I have personally reviewed and noted the following in the patient's chart:   . Medical and social history . Use of alcohol, tobacco or illicit drugs  . Current medications and supplements . Functional ability and status . Nutritional status . Physical activity . Advanced directives . List of other physicians . Hospitalizations, surgeries, and ER visits in previous 12 months . Vitals . Screenings to include cognitive, depression, and falls . Referrals and appointments  In addition, I have reviewed and discussed with patient certain preventive protocols, quality metrics, and best practice recommendations. A written personalized care plan for preventive services as well as general preventive health recommendations were provided to patient.     Barb Merinoickeah E , LPN  0/98/11918/20/2020

## 2019-05-05 NOTE — Progress Notes (Signed)
Subjective:     Patient ID: Sarah Weaver , female    DOB: 1935/11/22 , 83 y.o.   MRN: [id number]   CC DM FU   HPI Pt is here for DM FU, does not have any complaints today. Admits she has not been walking this Summer. Has stayed healthy and did not contract Covid since seen last time.  Glucose have been 135 average fasting.  Has had a cardiology televisit last month.   Past Medical History:  Diagnosis Date  . Arthritis    "all over"  . Chronic diastolic CHF (congestive heart failure) ([place])   . Chronic lower back pain   . CKD (chronic kidney disease), stage III ([place])   . Coronary artery disease    a. status post DES to the LAD 12/2015.  . Glaucoma   . Glaucoma of both eyes   . Hyperkalemia   . Hyperlipidemia   . Hypertension   . Type II diabetes mellitus (H[facility])      Family History  Problem Relation Age of Onset  . Heart disease Mother   . Heart attack Mother   . Hypertension Mother   . Heart disease Sister   . Cancer Sister   . Stroke Sister      Current Outpatient Medications:  .  amLODipine (NORVASC) 5 MG tablet, Take 1 tablet (5 mg total) by mouth daily., Disp: 90 tablet, Rfl: 3 .  aspirin EC 81 MG tablet, Take 81 mg by mouth daily., Disp: , Rfl:  .  atorvastatin (LIPITOR) 80 MG tablet, TAKE 1 TABLET BY MOUTH DAILY AT 6PM, Disp: 90 tablet, Rfl: 1 .  BD PEN NEEDLE NANO U/F 32G X 4 MM MISC, USE NIGHTLY, Disp: 100 each, Rfl: 1 .  brinzolamide (AZOPT) 1 % ophthalmic suspension, Place 1 drop into both eyes 3 (three) times daily. , Disp: , Rfl:  .  BYSTOLIC 2.5 MG tablet, TAKE 1 TABLET BY MOUTH EVERY DAY, Disp: 90 tablet, Rfl: 2 .  Cholecalciferol (NATURAL VITAMIN D-3) 5000 units TABS, Take 1 tablet by mouth daily., Disp: , Rfl:  .  ezetimibe (ZETIA) 10 MG tablet, Take 1 tablet (10 mg total) by mouth daily., Disp: 90 tablet, Rfl: 3 .  furosemide (LASIX) 40 MG tablet, TAKE 1 TABLET BY MOUTH EVERY DAY, Disp: 90 tablet, Rfl: 2 .  insulin degludec (TRESIBA FLEXTOUCH)  100 UNIT/ML SOPN FlexTouch Pen, Inject 0.1 mLs (10 Units total) into the skin daily., Disp: 5 pen, Rfl: 3 .  JANUVIA 100 MG tablet, TAKE 1 TABLET BY MOUTH ONCE DAILY, Disp: 90 tablet, Rfl: 0 .  Latanoprostene Bunod (VYZULTA) 0.024 % SOLN, Apply 1 drop to eye daily., Disp: , Rfl:  .  levothyroxine (SYNTHROID) 50 MCG tablet, TAKE 1 TABLET BY MOUTH DAILY BEFORE BREAKFAST, Disp: 30 tablet, Rfl: 1 .  losartan (COZAAR) 25 MG tablet, TAKE 1 TABLET BY MOUTH EVERY DAY, Disp: 90 tablet, Rfl: 1 .  ONETOUCH VERIO test strip, CHECK BLOOD SUGARS ONCE DAILY, Disp: 50 strip, Rfl: 11 .  traMADol-acetaminophen (ULTRACET) 37.5-325 MG tablet, Take 1 tablet by mouth every 6 (six) hours as needed for moderate pain (prn back pain)., Disp: 30 tablet, Rfl: 0   Allergies  Allergen Reactions  . Carvedilol Cough    Pt reports causes her to cough  . Lisinopril Cough    REPORTS CAUSES DRY COUGH     Review of Systems  Review of Systems  Constitutional: Negative for diaphoresis and unexpected weight change.  HENT: Negative for  tinnitus.   Eyes: Negative for visual disturbance.  Respiratory: Negative for chest tightness and shortness of breath.   Cardiovascular: Negative for chest pain, palpitations and leg swelling.  Gastrointestinal: Negative for constipation, diarrhea and nausea.  Endocrine: Negative for polydipsia, polyphagia and polyuria.  Genitourinary: Negative for dysuria and frequency.  Skin: Negative for rash and wound.  Neurological: Negative for dizziness, speech difficulty, weakness, numbness and headaches.      Objective:  Physical Exam  Vitals:   05/05/19 0929  Weight: 299 lb 3.2 oz (135.7 kg)  Height: 5' 2.4" (1.585 m)  Wt is down 5 lbs since May this year Constitutional: She is oriented to person, place, and time. She appears well-developed and well-nourished. No distress.  HENT:  Head: Normocephalic and atraumatic.  Right Ear: External ear normal.  Left Ear: External ear normal.  Nose:  Nose normal.  Eyes: Conjunctivae are normal. Right eye exhibits no discharge. Left eye exhibits no discharge. No scleral icterus.  Neck: Neck supple. No thyromegaly present.  No carotid bruits bilaterally  Cardiovascular: Normal rate and regular rhythm.  No murmur heard. Pulmonary/Chest: Effort normal and breath sounds normal. No respiratory distress.  Musculoskeletal: Normal range of motion. She exhibits no edema.  Lymphadenopathy:    She has no cervical adenopathy.  Neurological: She is alert and oriented to person, place, and time.  Skin: Skin is warm and dry. Capillary refill takes less than 2 seconds. No rash noted. She is not diaphoretic.  Psychiatric: She has a normal mood and affect. Her behavior is normal. Judgment and thought content normal.  Nursing note reviewed.    Assessment And Plan:    1. Mixed hyperlipidemia- chronic - Lipid Profile  2. Uncontrolled type 2 diabetes mellitus with hyperglycemia (HCC)- chronic - Hemoglobin A1c  3. Essential hypertension- not optimal control. Will continue Fu with cardiology. - CBC no Diff  4. Hypothyroidism- new. Has not had labs since started on synthroid.  - TSH - T3, free - T4, Free  FU 2 month or sooner if DM is not well controlled.   Wally Shevchenko RODRIGUEZ-SOUTHWORTH, PA-C    THE PATIENT IS ENCOURAGED TO PRACTICE SOCIAL DISTANCING DUE TO THE COVID-19 PANDEMIC.

## 2019-05-05 NOTE — Patient Instructions (Signed)
Sarah Weaver , Thank you for taking time to come for your Medicare Wellness Visit. I appreciate your ongoing commitment to your health goals. Please review the following plan we discussed and let me know if I can assist you in the future.   Screening recommendations/referrals: Colonoscopy: not required Mammogram: not required Bone Density: due Recommended yearly ophthalmology/optometry visit for glaucoma screening and checkup Recommended yearly dental visit for hygiene and checkup  Vaccinations: Influenza vaccine: 06/2018 Pneumococcal vaccine: 07/2017 Tdap vaccine: 09/2018 Shingles vaccine: discussed    Advanced directives: Advance directive discussed with you today. Even though you declined this today please call our office should you change your mind and we can give you the proper paperwork for you to fill out.   Conditions/risks identified: obesity  Next appointment: 08/18/2019 at 11:00   Preventive Care 38 Years and Older, Female Preventive care refers to lifestyle choices and visits with your health care provider that can promote health and wellness. What does preventive care include?  A yearly physical exam. This is also called an annual well check.  Dental exams once or twice a year.  Routine eye exams. Ask your health care provider how often you should have your eyes checked.  Personal lifestyle choices, including:  Daily care of your teeth and gums.  Regular physical activity.  Eating a healthy diet.  Avoiding tobacco and drug use.  Limiting alcohol use.  Practicing safe sex.  Taking low-dose aspirin every day.  Taking vitamin and mineral supplements as recommended by your health care provider. What happens during an annual well check? The services and screenings done by your health care provider during your annual well check will depend on your age, overall health, lifestyle risk factors, and family history of disease. Counseling  Your health care  provider may ask you questions about your:  Alcohol use.  Tobacco use.  Drug use.  Emotional well-being.  Home and relationship well-being.  Sexual activity.  Eating habits.  History of falls.  Memory and ability to understand (cognition).  Work and work Statistician.  Reproductive health. Screening  You may have the following tests or measurements:  Height, weight, and BMI.  Blood pressure.  Lipid and cholesterol levels. These may be checked every 5 years, or more frequently if you are over 67 years old.  Skin check.  Lung cancer screening. You may have this screening every year starting at age 1 if you have a 30-pack-year history of smoking and currently smoke or have quit within the past 15 years.  Fecal occult blood test (FOBT) of the stool. You may have this test every year starting at age 71.  Flexible sigmoidoscopy or colonoscopy. You may have a sigmoidoscopy every 5 years or a colonoscopy every 10 years starting at age 57.  Hepatitis C blood test.  Hepatitis B blood test.  Sexually transmitted disease (STD) testing.  Diabetes screening. This is done by checking your blood sugar (glucose) after you have not eaten for a while (fasting). You may have this done every 1-3 years.  Bone density scan. This is done to screen for osteoporosis. You may have this done starting at age 9.  Mammogram. This may be done every 1-2 years. Talk to your health care provider about how often you should have regular mammograms. Talk with your health care provider about your test results, treatment options, and if necessary, the need for more tests. Vaccines  Your health care provider may recommend certain vaccines, such as:  Influenza vaccine. This is  recommended every year.  Tetanus, diphtheria, and acellular pertussis (Tdap, Td) vaccine. You may need a Td booster every 10 years.  Zoster vaccine. You may need this after age 61.  Pneumococcal 13-valent conjugate (PCV13)  vaccine. One dose is recommended after age 37.  Pneumococcal polysaccharide (PPSV23) vaccine. One dose is recommended after age 42. Talk to your health care provider about which screenings and vaccines you need and how often you need them. This information is not intended to replace advice given to you by your health care provider. Make sure you discuss any questions you have with your health care provider. Document Released: 09/28/2015 Document Revised: 05/21/2016 Document Reviewed: 07/03/2015 Elsevier Interactive Patient Education  2017 [place] Prevention in the Home Falls can cause injuries. They can happen to people of all ages. There are many things you can do to make your home safe and to help prevent falls. What can I do on the outside of my home?  Regularly fix the edges of walkways and driveways and fix any cracks.  Remove anything that might make you trip as you walk through a door, such as a raised step or threshold.  Trim any bushes or trees on the path to your home.  Use bright outdoor lighting.  Clear any walking paths of anything that might make someone trip, such as rocks or tools.  Regularly check to see if handrails are loose or broken. Make sure that both sides of any steps have handrails.  Any raised decks and porches should have guardrails on the edges.  Have any leaves, snow, or ice cleared regularly.  Use sand or salt on walking paths during winter.  Clean up any spills in your garage right away. This includes oil or grease spills. What can I do in the bathroom?  Use night lights.  Install grab bars by the toilet and in the tub and shower. Do not use towel bars as grab bars.  Use non-skid mats or decals in the tub or shower.  If you need to sit down in the shower, use a plastic, non-slip stool.  Keep the floor dry. Clean up any water that spills on the floor as soon as it happens.  Remove soap buildup in the tub or shower regularly.   Attach bath mats securely with double-sided non-slip rug tape.  Do not have throw rugs and other things on the floor that can make you trip. What can I do in the bedroom?  Use night lights.  Make sure that you have a light by your bed that is easy to reach.  Do not use any sheets or blankets that are too big for your bed. They should not hang down onto the floor.  Have a firm chair that has side arms. You can use this for support while you get dressed.  Do not have throw rugs and other things on the floor that can make you trip. What can I do in the kitchen?  Clean up any spills right away.  Avoid walking on wet floors.  Keep items that you use a lot in easy-to-reach places.  If you need to reach something above you, use a strong step stool that has a grab bar.  Keep electrical cords out of the way.  Do not use floor polish or wax that makes floors slippery. If you must use wax, use non-skid floor wax.  Do not have throw rugs and other things on the floor that can make you trip.  What can I do with my stairs?  Do not leave any items on the stairs.  Make sure that there are handrails on both sides of the stairs and use them. Fix handrails that are broken or loose. Make sure that handrails are as long as the stairways.  Check any carpeting to make sure that it is firmly attached to the stairs. Fix any carpet that is loose or worn.  Avoid having throw rugs at the top or bottom of the stairs. If you do have throw rugs, attach them to the floor with carpet tape.  Make sure that you have a light switch at the top of the stairs and the bottom of the stairs. If you do not have them, ask someone to add them for you. What else can I do to help prevent falls?  Wear shoes that:  Do not have high heels.  Have rubber bottoms.  Are comfortable and fit you well.  Are closed at the toe. Do not wear sandals.  If you use a stepladder:  Make sure that it is fully opened. Do not climb  a closed stepladder.  Make sure that both sides of the stepladder are locked into place.  Ask someone to hold it for you, if possible.  Clearly mark and make sure that you can see:  Any grab bars or handrails.  First and last steps.  Where the edge of each step is.  Use tools that help you move around (mobility aids) if they are needed. These include:  Canes.  Walkers.  Scooters.  Crutches.  Turn on the lights when you go into a dark area. Replace any light bulbs as soon as they burn out.  Set up your furniture so you have a clear path. Avoid moving your furniture around.  If any of your floors are uneven, fix them.  If there are any pets around you, be aware of where they are.  Review your medicines with your doctor. Some medicines can make you feel dizzy. This can increase your chance of falling. Ask your doctor what other things that you can do to help prevent falls. This information is not intended to replace advice given to you by your health care provider. Make sure you discuss any questions you have with your health care provider. Document Released: 06/28/2009 Document Revised: 02/07/2016 Document Reviewed: 10/06/2014 Elsevier Interactive Patient Education  2017 [employer].

## 2019-05-05 NOTE — Addendum Note (Signed)
Addended by: Kellie Simmering on: 05/05/2019 10:31 AM   Modules accepted: Orders

## 2019-05-06 LAB — CBC
Hematocrit: 38.9 % (ref 34.0–46.6)
Hemoglobin: 12 g/dL (ref 11.1–15.9)
MCH: 28 pg (ref 26.6–33.0)
MCHC: 30.8 g/dL — ABNORMAL LOW (ref 31.5–35.7)
MCV: 91 fL (ref 79–97)
Platelets: 196 10*3/uL (ref 150–450)
RBC: 4.29 x10E6/uL (ref 3.77–5.28)
RDW: 13 % (ref 11.7–15.4)
WBC: 3.6 10*3/uL (ref 3.4–10.8)

## 2019-05-06 LAB — LIPID PANEL
Chol/HDL Ratio: 2.6 ratio (ref 0.0–4.4)
Cholesterol, Total: 115 mg/dL (ref 100–199)
HDL: 45 mg/dL (ref >39)
LDL Calculated: 54 mg/dL (ref 0–99)
Triglycerides: 78 mg/dL (ref 0–149)
VLDL Cholesterol Cal: 16 mg/dL (ref 5–40)

## 2019-05-06 LAB — HEMOGLOBIN A1C
Est. average glucose Bld gHb Est-mCnc: 206 mg/dL
Hgb A1c MFr Bld: 8.8 % — ABNORMAL HIGH (ref 4.8–5.6)

## 2019-05-06 LAB — T3, FREE: T3, Free: 2.6 pg/mL (ref 2.0–4.4)

## 2019-05-06 LAB — TSH: TSH: 1.59 u[IU]/mL (ref 0.450–4.500)

## 2019-05-06 LAB — T4, FREE: Free T4: 1.32 ng/dL (ref 0.82–1.77)

## 2019-05-08 ENCOUNTER — Other Ambulatory Visit: Payer: Self-pay | Admitting: Cardiology

## 2019-05-12 ENCOUNTER — Other Ambulatory Visit: Payer: Self-pay | Admitting: Internal Medicine

## 2019-05-12 MED ORDER — LEVOTHYROXINE SODIUM 50 MCG PO TABS: ORAL_TABLET | ORAL | 1 refills | Status: DC

## 2019-05-12 MED ORDER — SITAGLIPTIN PHOSPHATE 50 MG PO TABS: 50.0000 mg | ORAL_TABLET | Freq: Every day | ORAL | 0 refills | Status: DC

## 2019-05-12 MED ORDER — OZEMPIC (0.25 OR 0.5 MG/DOSE) 2 MG/1.5ML ~~LOC~~ SOPN: 0.2500 mg | PEN_INJECTOR | SUBCUTANEOUS | 1 refills | Status: DC

## 2019-05-12 NOTE — Progress Notes (Unsigned)
She also needs to decrease  the Januvia to 50 mg per dose, instead of 100 mg  since her kidney function is getting worse. Eventually will be stopped as we work with getting her glucose better controlled with Ozempic. New dose was sent.

## 2019-05-17 ENCOUNTER — Other Ambulatory Visit: Payer: Self-pay

## 2019-05-17 ENCOUNTER — Ambulatory Visit: Payer: Medicare Other

## 2019-05-17 VITALS — BP 160/82 | HR 73 | Temp 98.5°F

## 2019-05-17 DIAGNOSIS — E119 Type 2 diabetes mellitus without complications: Secondary | ICD-10-CM

## 2019-05-17 NOTE — Progress Notes (Signed)
Pt came in today for ozempic training. Pt self injected today. Pt thinks that her a1c increased bc she doesn't think that she has been taking the tresiba correctly.

## 2019-05-30 ENCOUNTER — Other Ambulatory Visit: Payer: Self-pay | Admitting: Internal Medicine

## 2019-05-30 DIAGNOSIS — E119 Type 2 diabetes mellitus without complications: Secondary | ICD-10-CM

## 2019-06-16 ENCOUNTER — Other Ambulatory Visit: Payer: Self-pay

## 2019-06-16 ENCOUNTER — Encounter: Payer: Self-pay | Admitting: Internal Medicine

## 2019-06-16 ENCOUNTER — Ambulatory Visit (INDEPENDENT_AMBULATORY_CARE_PROVIDER_SITE_OTHER): Payer: Medicare Other | Admitting: Internal Medicine

## 2019-06-16 VITALS — BP 142/86 | HR 78 | Temp 97.9°F | Ht 62.4 in | Wt 298.8 lb

## 2019-06-16 DIAGNOSIS — E2839 Other primary ovarian failure: Secondary | ICD-10-CM | POA: Diagnosis not present

## 2019-06-16 DIAGNOSIS — Z794 Long term (current) use of insulin: Secondary | ICD-10-CM

## 2019-06-16 DIAGNOSIS — I119 Hypertensive heart disease without heart failure: Secondary | ICD-10-CM

## 2019-06-16 DIAGNOSIS — E0822 Diabetes mellitus due to underlying condition with diabetic chronic kidney disease: Secondary | ICD-10-CM

## 2019-06-16 DIAGNOSIS — I1310 Hypertensive heart and chronic kidney disease without heart failure, with stage 1 through stage 4 chronic kidney disease, or unspecified chronic kidney disease: Secondary | ICD-10-CM | POA: Diagnosis not present

## 2019-06-16 DIAGNOSIS — N1831 Chronic kidney disease, stage 3a: Secondary | ICD-10-CM

## 2019-06-16 DIAGNOSIS — E0821 Diabetes mellitus due to underlying condition with diabetic nephropathy: Secondary | ICD-10-CM

## 2019-06-16 MED ORDER — AMLODIPINE BESYLATE 10 MG PO TABS: 10.0000 mg | ORAL_TABLET | Freq: Every day | ORAL | 0 refills | Status: DC

## 2019-06-16 MED ORDER — POLYETHYLENE GLYCOL 3350 17 G PO PACK: 17.0000 g | PACK | Freq: Every day | ORAL | 2 refills | Status: DC

## 2019-06-16 NOTE — Patient Instructions (Signed)
Stop taking the Januvia completely.   Come back in 2 weeks and bring your glucose readings again.   If you start having weakness, sweating check your glucose, if is less than 100 do not give yourself the insulin at bed time.

## 2019-06-16 NOTE — Progress Notes (Addendum)
Subjective:     Patient ID: Sarah Weaver , female    DOB: 1936-03-20 , 83 y.o.   MRN: [id number]   Chief Complaint  Patient presents with  . Follow-up    DM    HPI  Pt is here for DM Fu and review her glucose diaries. She states she did not hear from my message about decreasing the Januvia to 50 mg.  This am fasting glucose was 89, most of her fasting have been in the low 100's, only once 125 in the past 2 weeks. She is now on Ozempic 0.5 which her first dose was 9/29. Denies hypoglycemia episodes.  Eye exam is due in Deccember she is over due to have Dexa.   Past Medical History:  Diagnosis Date  . Arthritis    "all over"  . Chronic diastolic CHF (congestive heart failure) (HCC)   . Chronic lower back pain   . CKD (chronic kidney disease), stage III (HCC)   . Coronary artery disease    a. status post DES to the LAD 12/2015.  . Glaucoma   . Glaucoma of both eyes   . Hyperkalemia   . Hyperlipidemia   . Hypertension   . Type II diabetes mellitus (HCC)      Family History  Problem Relation Age of Onset  . Heart disease Mother   . Heart attack Mother   . Hypertension Mother   . Heart disease Sister   . Cancer Sister   . Stroke Sister      Current Outpatient Medications:  .  amLODipine (NORVASC) 5 MG tablet, Take 1 tablet (5 mg total) by mouth daily., Disp: 90 tablet, Rfl: 3 .  aspirin EC 81 MG tablet, Take 81 mg by mouth daily., Disp: , Rfl:  .  atorvastatin (LIPITOR) 80 MG tablet, TAKE 1 TABLET BY MOUTH DAILY AT 6PM, Disp: 90 tablet, Rfl: 1 .  BD PEN NEEDLE NANO U/F 32G X 4 MM MISC, USE NIGHTLY, Disp: 100 each, Rfl: 1 .  brinzolamide (AZOPT) 1 % ophthalmic suspension, Place 1 drop into both eyes 3 (three) times daily. , Disp: , Rfl:  .  Cholecalciferol (NATURAL VITAMIN D-3) 5000 units TABS, Take 1 tablet by mouth daily., Disp: , Rfl:  .  furosemide (LASIX) 40 MG tablet, TAKE 1 TABLET BY MOUTH EVERY DAY, Disp: 90 tablet, Rfl: 2 .  insulin degludec (TRESIBA  FLEXTOUCH) 100 UNIT/ML SOPN FlexTouch Pen, Inject 0.1 mLs (10 Units total) into the skin daily., Disp: 5 pen, Rfl: 3 .  Latanoprostene Bunod (VYZULTA) 0.024 % SOLN, Apply 1 drop to eye daily., Disp: , Rfl:  .  levothyroxine (SYNTHROID) 50 MCG tablet, TAKE 1 TABLET BY MOUTH DAILY BEFORE BREAKFAST, Disp: 90 tablet, Rfl: 1 .  losartan (COZAAR) 25 MG tablet, TAKE 1 TABLET BY MOUTH EVERY DAY, Disp: 90 tablet, Rfl: 1 .  nebivolol (BYSTOLIC) 2.5 MG tablet, Take 1 tablet (2.5 mg total) by mouth daily., Disp: 90 tablet, Rfl: 1 .  ONETOUCH VERIO test strip, CHECK BLOOD SUGARS ONCE DAILY, Disp: 50 strip, Rfl: 11 .  Semaglutide,0.25 or 0.5MG /DOS, (OZEMPIC, 0.25 OR 0.5 MG/DOSE,) 2 MG/1.5ML SOPN, Inject 0.25 mg into the skin once a week., Disp: 3 pen, Rfl: 1 .  sitaGLIPtin (JANUVIA) 50 MG tablet, Take 1 tablet (50 mg total) by mouth daily., Disp: 30 tablet, Rfl: 0 .  traMADol-acetaminophen (ULTRACET) 37.5-325 MG tablet, Take 1 tablet by mouth every 6 (six) hours as needed for moderate pain (prn back pain)., Disp:  30 tablet, Rfl: 0 .  ezetimibe (ZETIA) 10 MG tablet, Take 1 tablet (10 mg total) by mouth daily., Disp: 90 tablet, Rfl: 3   Allergies  Allergen Reactions  . Carvedilol Cough    Pt reports causes her to cough  . Lisinopril Cough    REPORTS CAUSES DRY COUGH     Review of Systems  Denies worse constipation than her normal, has tried colace in the past and has not helped.  Denies chest pain, SOB, polyuria, polyphagia, slow healing wounds, numbness or tingling of feet, urinary difficulties, HA, sweating.  Today's Vitals   06/16/19 0929  BP: (!) 142/86  Pulse: 78  Temp: 97.9 F (36.6 C)  TempSrc: Oral  Weight: 298 lb 12.8 oz (135.5 kg)  Height: 5' 2.4" (1.585 m)   Body mass index is 53.95 kg/m.   Objective:  Physical Exam  Constitutional: She is oriented to person, place, and time. She appears well-developed and well-nourished. No distress. Uses a walker.  HENT:  Head: Normocephalic and  atraumatic.  Right Ear: External ear normal.  Left Ear: External ear normal.  Nose: Nose normal.  Eyes: Conjunctivae are normal. Right eye exhibits no discharge. Left eye exhibits no discharge. No scleral icterus.  Neck: Neck supple. No thyromegaly present.  No carotid bruits bilaterally  Cardiovascular: Normal rate and regular rhythm.  No murmur heard. Pulmonary/Chest: Effort normal and breath sounds normal. No respiratory distress.  Musculoskeletal: Normal range of motion. She exhibits no edema.  Lymphadenopathy:    She has no cervical adenopathy.  Neurological: She is alert and oriented to person, place, and time.  Skin: Skin is warm and dry. Capillary refill takes less than 2 seconds. No rash noted. She is not diaphoretic.  Psychiatric: She has a normal mood and affect. Her behavior is normal. Judgment and thought content normal.  Nursing note reviewed.  Assessment And Plan:  1. Decreased estrogen level- chronic - DG DXA FRACTURE ASSESSMENT; Future  2. Hypertensive heart disease without heart failure- not well controled HTN. I increased her Norvasc to 10 mg qd. Fu 2 weeks.   3. Stage 3a chronic kidney disease- chronic. Will d/c Januvia and is already on Ozempic 0.5 mg weekly.   4. Diabetes mellitus Type 2 with long term use of insulin and with stage 3a chronic kidney disease ([place])- may continue Current meds, except needs to d/c the Januvia. BMP due next time. Asked to bring her glucose readings again in 2 weeks for FU. I will continue seeing her frequently until her HGA1C is 7 or less.     Arcadia Gorgas RODRIGUEZ-SOUTHWORTH, PA-C    THE PATIENT IS ENCOURAGED TO PRACTICE SOCIAL DISTANCING DUE TO THE COVID-19 PANDEMIC.

## 2019-06-22 ENCOUNTER — Other Ambulatory Visit: Payer: Self-pay | Admitting: Cardiology

## 2019-06-23 ENCOUNTER — Other Ambulatory Visit: Payer: Self-pay

## 2019-06-23 ENCOUNTER — Ambulatory Visit: Payer: Medicare Other | Admitting: Internal Medicine

## 2019-06-23 ENCOUNTER — Ambulatory Visit (INDEPENDENT_AMBULATORY_CARE_PROVIDER_SITE_OTHER): Payer: Medicare Other | Admitting: Internal Medicine

## 2019-06-23 ENCOUNTER — Encounter: Payer: Self-pay | Admitting: Internal Medicine

## 2019-06-23 VITALS — BP 138/84 | HR 78 | Temp 98.2°F | Ht 62.8 in | Wt 294.8 lb

## 2019-06-23 DIAGNOSIS — Z23 Encounter for immunization: Secondary | ICD-10-CM

## 2019-06-23 DIAGNOSIS — R300 Dysuria: Secondary | ICD-10-CM

## 2019-06-23 DIAGNOSIS — R1031 Right lower quadrant pain: Secondary | ICD-10-CM

## 2019-06-23 LAB — POCT URINALYSIS DIPSTICK
Bilirubin, UA: NEGATIVE
Glucose, UA: NEGATIVE
Ketones, UA: NEGATIVE
Leukocytes, UA: NEGATIVE
Nitrite, UA: NEGATIVE
Protein, UA: POSITIVE — AB
Spec Grav, UA: 1.025 (ref 1.010–1.025)
Urobilinogen, UA: 0.2 E.U./dL
pH, UA: 5.5 (ref 5.0–8.0)

## 2019-06-23 LAB — CBC WITH DIFFERENTIAL/PLATELET
Basophils Absolute: 0 10*3/uL (ref 0.0–0.2)
Basos: 0 %
EOS (ABSOLUTE): 0.1 10*3/uL (ref 0.0–0.4)
Eos: 1 %
Hematocrit: 38.4 % (ref 34.0–46.6)
Hemoglobin: 12.6 g/dL (ref 11.1–15.9)
Lymphocytes Absolute: 1.7 10*3/uL (ref 0.7–3.1)
Lymphs: 39 %
MCH: 27.8 pg (ref 26.6–33.0)
MCHC: 32.8 g/dL (ref 31.5–35.7)
MCV: 85 fL (ref 79–97)
Monocytes Absolute: 0.5 10*3/uL (ref 0.1–0.9)
Monocytes: 11 %
Neutrophils Absolute: 2.1 10*3/uL (ref 1.4–7.0)
Neutrophils: 49 %
Platelets: 219 10*3/uL (ref 150–450)
RBC: 4.53 x10E6/uL (ref 3.77–5.28)
RDW: 13.4 % (ref 11.7–15.4)
WBC: 4.3 10*3/uL (ref 3.4–10.8)

## 2019-06-23 MED ORDER — CIPROFLOXACIN HCL 250 MG PO TABS: 250.0000 mg | ORAL_TABLET | Freq: Two times a day (BID) | ORAL | 0 refills | Status: DC

## 2019-06-23 NOTE — Progress Notes (Signed)
Subjective:     Patient ID: Sarah Weaver , female    DOB: July 07, 1936 , 83 y.o.   MRN: [id number]   Chief Complaint  Patient presents with  . Hip Pain    lower stomach    HPI  Onset of RLQ pain x 6 days and felt better to void. She denies injuring herself in any way. Has had mild dysuria. Pain is described as sharp and lasts only a few seconds. Nothing makes it worse. Denies hyperalgesia on this side or rash. Denies decreased appetite.   Past Medical History:  Diagnosis Date  . Arthritis    "all over"  . Chronic diastolic CHF (congestive heart failure) ([place])   . Chronic lower back pain   . CKD (chronic kidney disease), stage III   . Coronary artery disease    a. status post DES to the LAD 12/2015.  . Glaucoma   . Glaucoma of both eyes   . Hyperkalemia   . Hyperlipidemia   . Hypertension   . Type II diabetes mellitus (HCC)      Family History  Problem Relation Age of Onset  . Heart disease Mother   . Heart attack Mother   . Hypertension Mother   . Heart disease Sister   . Cancer Sister   . Stroke Sister      Current Outpatient Medications:  .  amLODipine (NORVASC) 10 MG tablet, Take 1 tablet (10 mg total) by mouth daily., Disp: 90 tablet, Rfl: 0 .  aspirin EC 81 MG tablet, Take 81 mg by mouth daily., Disp: , Rfl:  .  atorvastatin (LIPITOR) 80 MG tablet, TAKE 1 TABLET BY MOUTH DAILY AT 6PM, Disp: 90 tablet, Rfl: 1 .  BD PEN NEEDLE NANO U/F 32G X 4 MM MISC, USE NIGHTLY, Disp: 100 each, Rfl: 1 .  brinzolamide (AZOPT) 1 % ophthalmic suspension, Place 1 drop into both eyes 3 (three) times daily. , Disp: , Rfl:  .  Cholecalciferol (NATURAL VITAMIN D-3) 5000 units TABS, Take 1 tablet by mouth daily., Disp: , Rfl:  .  furosemide (LASIX) 40 MG tablet, TAKE 1 TABLET BY MOUTH EVERY DAY, Disp: 90 tablet, Rfl: 2 .  insulin degludec (TRESIBA FLEXTOUCH) 100 UNIT/ML SOPN FlexTouch Pen, Inject 0.1 mLs (10 Units total) into the skin daily., Disp: 5 pen, Rfl: 3 .   Latanoprostene Bunod (VYZULTA) 0.024 % SOLN, Apply 1 drop to eye daily., Disp: , Rfl:  .  levothyroxine (SYNTHROID) 50 MCG tablet, TAKE 1 TABLET BY MOUTH DAILY BEFORE BREAKFAST, Disp: 90 tablet, Rfl: 1 .  losartan (COZAAR) 25 MG tablet, TAKE 1 TABLET BY MOUTH EVERY DAY, Disp: 90 tablet, Rfl: 2 .  nebivolol (BYSTOLIC) 2.5 MG tablet, Take 1 tablet (2.5 mg total) by mouth daily., Disp: 90 tablet, Rfl: 1 .  ONETOUCH VERIO test strip, CHECK BLOOD SUGARS ONCE DAILY, Disp: 50 strip, Rfl: 11 .  polyethylene glycol (MIRALAX) 17 g packet, Take 17 g by mouth daily., Disp: 100 each, Rfl: 2 .  Semaglutide,0.25 or 0.5MG /DOS, (OZEMPIC, 0.25 OR 0.5 MG/DOSE,) 2 MG/1.5ML SOPN, Inject 0.25 mg into the skin once a week., Disp: 3 pen, Rfl: 1 .  sitaGLIPtin (JANUVIA) 50 MG tablet, Take 1 tablet (50 mg total) by mouth daily., Disp: 30 tablet, Rfl: 0 .  traMADol-acetaminophen (ULTRACET) 37.5-325 MG tablet, Take 1 tablet by mouth every 6 (six) hours as needed for moderate pain (prn back pain)., Disp: 30 tablet, Rfl: 0 .  ezetimibe (ZETIA) 10 MG tablet, Take 1  tablet (10 mg total) by mouth daily., Disp: 90 tablet, Rfl: 3   Allergies  Allergen Reactions  . Carvedilol Cough    Pt reports causes her to cough  . Lisinopril Cough    REPORTS CAUSES DRY COUGH     Review of Systems  Constipation is better with Miralax. + for mild dysuria, but denies urgency, frequency, blood in urine N/V, fever, chills or sweats.  Today's Vitals   06/23/19 0931  BP: 138/84  Pulse: 78  Temp: 98.2 F (36.8 C)  TempSrc: Oral  Weight: 294 lb 12.8 oz (133.7 kg)  Height: 5' 2.8" (1.595 m)   Body mass index is 52.55 kg/m.   Objective:  Physical Exam Vitals signs and nursing note reviewed.  Constitutional:      General: She is not in acute distress.    Appearance: She is obese. She is not toxic-appearing.  HENT:     Right Ear: External ear normal.     Left Ear: External ear normal.     Nose: Nose normal.  Eyes:     General: No  scleral icterus.    Conjunctiva/sclera: Conjunctivae normal.  Neck:     Musculoskeletal: Neck supple.  Pulmonary:     Effort: Pulmonary effort is normal.  Abdominal:     General: Bowel sounds are normal.     Tenderness: There is abdominal tenderness. There is no right CVA tenderness, left CVA tenderness, guarding or rebound.     Comments: Mild to moderate tenderness on R mid and LQ area.   Neurological:     Mental Status: She is alert and oriented to person, place, and time.     Comments: Uses a walker to ambulate  Psychiatric:        Mood and Affect: Mood normal.        Behavior: Behavior normal.        Thought Content: Thought content normal.        Judgment: Judgment normal.     Wt is down 4 lbs.    UA- SG- 1.025, trace hemolized blood. The rest is neg.  Assessment And Plan:  1. Need for influenza vaccination- routine - Flu vaccine HIGH DOSE PF (Fluzone High dose)  2. Dysuria- possibly early UTI - POCT Urinalysis Dipstick (81002) - Culture, Urine I started her on Cipro 250 mg bid x 5 days 3. RLQ abdominal pain- acute. - CBC with Diff STAT  I will call her with CBC results when is back. She has FU DM next week with her, so I will see her then for RLQ Fu Told if she gets worse, she needs to go to ER.     Odeth Bry RODRIGUEZ-SOUTHWORTH, PA-C    THE PATIENT IS ENCOURAGED TO PRACTICE SOCIAL DISTANCING DUE TO THE COVID-19 PANDEMIC.

## 2019-06-23 NOTE — Patient Instructions (Signed)
You have a little blood in your urine which could be an early infection, so I will send it for a culture and start you on antibiotics til that is back.  I will call you when the cell count is back, which this will help me check for possible appendicitis.   If you pain persists come see me next week for follow up. If it gets severe go to the ER.

## 2019-06-24 LAB — URINE CULTURE

## 2019-06-29 ENCOUNTER — Other Ambulatory Visit: Payer: Self-pay | Admitting: Internal Medicine

## 2019-06-30 ENCOUNTER — Encounter: Payer: Self-pay | Admitting: Internal Medicine

## 2019-06-30 ENCOUNTER — Ambulatory Visit (INDEPENDENT_AMBULATORY_CARE_PROVIDER_SITE_OTHER): Payer: Medicare Other | Admitting: Internal Medicine

## 2019-06-30 ENCOUNTER — Other Ambulatory Visit: Payer: Self-pay

## 2019-06-30 ENCOUNTER — Ambulatory Visit: Payer: Medicare Other | Admitting: Internal Medicine

## 2019-06-30 VITALS — BP 138/84 | HR 78 | Temp 98.2°F | Ht 62.8 in | Wt 293.8 lb

## 2019-06-30 DIAGNOSIS — R1031 Right lower quadrant pain: Secondary | ICD-10-CM | POA: Diagnosis not present

## 2019-06-30 DIAGNOSIS — I10 Essential (primary) hypertension: Secondary | ICD-10-CM

## 2019-06-30 DIAGNOSIS — E119 Type 2 diabetes mellitus without complications: Secondary | ICD-10-CM

## 2019-06-30 DIAGNOSIS — I129 Hypertensive chronic kidney disease with stage 1 through stage 4 chronic kidney disease, or unspecified chronic kidney disease: Secondary | ICD-10-CM

## 2019-06-30 DIAGNOSIS — E1122 Type 2 diabetes mellitus with diabetic chronic kidney disease: Secondary | ICD-10-CM | POA: Diagnosis not present

## 2019-06-30 DIAGNOSIS — N1831 Chronic kidney disease, stage 3a: Secondary | ICD-10-CM

## 2019-06-30 DIAGNOSIS — K5901 Slow transit constipation: Secondary | ICD-10-CM

## 2019-06-30 LAB — CMP14 + ANION GAP
ALT: 20 IU/L (ref 0–32)
AST: 19 IU/L (ref 0–40)
Albumin/Globulin Ratio: 1.6 (ref 1.2–2.2)
Albumin: 4.2 g/dL (ref 3.6–4.6)
Alkaline Phosphatase: 91 IU/L (ref 39–117)
Anion Gap: 16 mmol/L (ref 10.0–18.0)
BUN/Creatinine Ratio: 11 — ABNORMAL LOW (ref 12–28)
BUN: 15 mg/dL (ref 8–27)
Bilirubin Total: 0.4 mg/dL (ref 0.0–1.2)
CO2: 24 mmol/L (ref 20–29)
Calcium: 10 mg/dL (ref 8.7–10.3)
Chloride: 105 mmol/L (ref 96–106)
Creatinine, Ser: 1.41 mg/dL — ABNORMAL HIGH (ref 0.57–1.00)
GFR calc Af Amer: 40 mL/min/{1.73_m2} — ABNORMAL LOW (ref >59)
GFR calc non Af Amer: 35 mL/min/{1.73_m2} — ABNORMAL LOW (ref >59)
Globulin, Total: 2.7 g/dL (ref 1.5–4.5)
Glucose: 135 mg/dL — ABNORMAL HIGH (ref 65–99)
Potassium: 4.6 mmol/L (ref 3.5–5.2)
Sodium: 145 mmol/L — ABNORMAL HIGH (ref 134–144)
Total Protein: 6.9 g/dL (ref 6.0–8.5)

## 2019-06-30 MED ORDER — LINACLOTIDE 72 MCG PO CAPS: ORAL_CAPSULE | ORAL | 0 refills | Status: DC

## 2019-06-30 NOTE — Progress Notes (Signed)
Subjective:     Patient ID: Sarah Weaver , female    DOB: [birth year] , 83 y.o.   MRN: [id number]   Chief Complaint  Patient presents with  . Diabetes    HPI  1- Her glucose fasting readings from her glucometer show 90's to low 100's in the past 2 weeks. The highest being 124. Things she has tried for constipations are: colase, miralax. Mild magnesia, metamucil, ducolax and none have helped. She states she feels better since her glucose have improved   2- RLQ pain seems related to constipation, because after BM it goes away. No BM in 3 days and pain is coming back but is not worse   Past Medical History:  Diagnosis Date  . Arthritis    "all over"  . Chronic diastolic CHF (congestive heart failure) (HCC)   . Chronic lower back pain   . CKD (chronic kidney disease), stage III   . Coronary artery disease    a. status post DES to the LAD 12/2015.  . Glaucoma   . Glaucoma of both eyes   . Hyperkalemia   . Hyperlipidemia   . Hypertension   . Type II diabetes mellitus (HCC)      Family History  Problem Relation Age of Onset  . Heart disease Mother   . Heart attack Mother   . Hypertension Mother   . Heart disease Sister   . Cancer Sister   . Stroke Sister      Current Outpatient Medications:  .  amLODipine (NORVASC) 10 MG tablet, Take 1 tablet (10 mg total) by mouth daily., Disp: 90 tablet, Rfl: 0 .  aspirin EC 81 MG tablet, Take 81 mg by mouth daily., Disp: , Rfl:  .  atorvastatin (LIPITOR) 80 MG tablet, TAKE 1 TABLET BY MOUTH DAILY AT 6PM, Disp: 90 tablet, Rfl: 1 .  BD PEN NEEDLE NANO U/F 32G X 4 MM MISC, USE NIGHTLY, Disp: 100 each, Rfl: 1 .  brinzolamide (AZOPT) 1 % ophthalmic suspension, Place 1 drop into both eyes 3 (three) times daily. , Disp: , Rfl:  .  Cholecalciferol (NATURAL VITAMIN D-3) 5000 units TABS, Take 1 tablet by mouth daily., Disp: , Rfl:  .  ciprofloxacin (CIPRO) 250 MG tablet, Take 1 tablet (250 mg total) by mouth 2 (two) times daily., Disp:  10 tablet, Rfl: 0 .  furosemide (LASIX) 40 MG tablet, TAKE 1 TABLET BY MOUTH EVERY DAY, Disp: 90 tablet, Rfl: 2 .  insulin degludec (TRESIBA FLEXTOUCH) 100 UNIT/ML SOPN FlexTouch Pen, Inject 0.1 mLs (10 Units total) into the skin daily., Disp: 5 pen, Rfl: 3 .  Latanoprostene Bunod (VYZULTA) 0.024 % SOLN, Apply 1 drop to eye daily., Disp: , Rfl:  .  levothyroxine (SYNTHROID) 50 MCG tablet, TAKE 1 TABLET BY MOUTH DAILY BEFORE BREAKFAST, Disp: 90 tablet, Rfl: 1 .  losartan (COZAAR) 25 MG tablet, TAKE 1 TABLET BY MOUTH EVERY DAY, Disp: 90 tablet, Rfl: 2 .  nebivolol (BYSTOLIC) 2.5 MG tablet, Take 1 tablet (2.5 mg total) by mouth daily., Disp: 90 tablet, Rfl: 1 .  ONETOUCH VERIO test strip, CHECK BLOOD SUGARS ONCE DAILY, Disp: 50 strip, Rfl: 11 .  polyethylene glycol (MIRALAX) 17 g packet, Take 17 g by mouth daily., Disp: 100 each, Rfl: 2 .  Semaglutide,0.25 or 0.5MG /DOS, (OZEMPIC, 0.25 OR 0.5 MG/DOSE,) 2 MG/1.5ML SOPN, Inject 0.25 mg into the skin once a week., Disp: 3 pen, Rfl: 1 .  sitaGLIPtin (JANUVIA) 50 MG tablet, Take 1 tablet (50  mg total) by mouth daily., Disp: 30 tablet, Rfl: 0 .  traMADol-acetaminophen (ULTRACET) 37.5-325 MG tablet, Take 1 tablet by mouth every 6 (six) hours as needed for moderate pain (prn back pain)., Disp: 30 tablet, Rfl: 0 .  ezetimibe (ZETIA) 10 MG tablet, Take 1 tablet (10 mg total) by mouth daily., Disp: 90 tablet, Rfl: 3   Allergies  Allergen Reactions  . Carvedilol Cough    Pt reports causes her to cough  . Lisinopril Cough    REPORTS CAUSES DRY COUGH     Review of Systems  + constipation, the rest of 10 point ROS is neg.  Today's Vitals   06/30/19 0843  BP: 138/84  Pulse: 78  Temp: 98.2 F (36.8 C)  TempSrc: Oral  Weight: 293 lb 12.8 oz (133.3 kg)  Height: 5' 2.8" (1.595 m)   Body mass index is 52.38 kg/m.   Objective:  Physical Exam   Constitutional: She is oriented to person, place, and time. She appears well-developed and well-nourished. No  distress.  HENT:  Head: Normocephalic and atraumatic.  Right Ear: External ear normal.  Left Ear: External ear normal.  Nose: Nose normal.  Eyes: Conjunctivae are normal. Right eye exhibits no discharge. Left eye exhibits no discharge. No scleral icterus.  Neck: Neck supple. No thyromegaly present.  No carotid bruits bilaterally  Cardiovascular: Normal rate and regular rhythm.  No murmur heard. Pulmonary/Chest: Effort normal and breath sounds normal. No respiratory distress.  Musculoskeletal: Normal range of motion. She exhibits no edema.  Lymphadenopathy:    She has no cervical adenopathy.  Neurological: She is alert and oriented to person, place, and time.  Skin: Skin is warm and dry. Capillary refill takes less than 2 seconds. No rash noted. She is not diaphoretic.  Psychiatric: She has a normal mood and affect. Her behavior is normal. Judgment and thought content normal.  Nursing note reviewed.    Assessment And Plan:     1. Stage 3a chronic kidney disease- chronic. Next nephrology visit is in January - CMP14 + Anion Gap  2. Essential hypertension- stable. May continue current med.   3. Type 2 diabetes mellitus without complication, without long-term current use of insulin ([place])- improving control.    May continue Ozempic at current dose and FU early December for HGBA1C 4. RLQ abdominal pain- related to constipation.    I gave her samples of lizess to try  5. Slow transit constipation- more likely due to Ozempic. I will have her d/c Miralax and gave her samples of Linzess 72 mg and 145 mg. She will start with the 72mg  and if after a couple of day she does not have a BM, will increase to 145 mg. She will let me know which one helps, and if none we can increase it to the next dose.   Chane Cowden RODRIGUEZ-SOUTHWORTH, PA-C    THE PATIENT IS ENCOURAGED TO PRACTICE SOCIAL DISTANCING DUE TO THE COVID-19 PANDEMIC.

## 2019-07-07 ENCOUNTER — Ambulatory Visit: Payer: Medicare Other | Admitting: Internal Medicine

## 2019-08-18 ENCOUNTER — Ambulatory Visit: Payer: Medicare Other | Admitting: Internal Medicine

## 2019-08-18 ENCOUNTER — Ambulatory Visit: Payer: Medicare Other

## 2019-08-25 ENCOUNTER — Encounter: Payer: Self-pay | Admitting: Internal Medicine

## 2019-08-25 ENCOUNTER — Ambulatory Visit (INDEPENDENT_AMBULATORY_CARE_PROVIDER_SITE_OTHER): Payer: Medicare Other | Admitting: Internal Medicine

## 2019-08-25 ENCOUNTER — Other Ambulatory Visit: Payer: Self-pay

## 2019-08-25 VITALS — BP 132/82 | HR 76 | Temp 97.7°F | Ht 62.8 in | Wt 296.0 lb

## 2019-08-25 DIAGNOSIS — E0821 Diabetes mellitus due to underlying condition with diabetic nephropathy: Secondary | ICD-10-CM | POA: Diagnosis not present

## 2019-08-25 DIAGNOSIS — I10 Essential (primary) hypertension: Secondary | ICD-10-CM | POA: Diagnosis not present

## 2019-08-25 DIAGNOSIS — E0822 Diabetes mellitus due to underlying condition with diabetic chronic kidney disease: Secondary | ICD-10-CM

## 2019-08-25 DIAGNOSIS — N1831 Chronic kidney disease, stage 3a: Secondary | ICD-10-CM | POA: Diagnosis not present

## 2019-08-25 DIAGNOSIS — E038 Other specified hypothyroidism: Secondary | ICD-10-CM | POA: Diagnosis not present

## 2019-08-25 DIAGNOSIS — I129 Hypertensive chronic kidney disease with stage 1 through stage 4 chronic kidney disease, or unspecified chronic kidney disease: Secondary | ICD-10-CM | POA: Diagnosis not present

## 2019-08-25 DIAGNOSIS — K5900 Constipation, unspecified: Secondary | ICD-10-CM

## 2019-08-25 DIAGNOSIS — Z794 Long term (current) use of insulin: Secondary | ICD-10-CM

## 2019-08-25 DIAGNOSIS — E66813 Obesity, class 3: Secondary | ICD-10-CM | POA: Insufficient documentation

## 2019-08-25 NOTE — Progress Notes (Signed)
This visit occurred during the SARS-CoV-2 public health emergency.  Safety protocols were in place, including screening questions prior to the visit, additional usage of staff PPE, and extensive cleaning of exam room while observing appropriate contact time as indicated for disinfecting solutions.  Subjective:     Patient ID: Sarah Weaver , female    DOB: [birth year] , 83 y.o.   MRN: [id number]   Chief Complaint  Patient presents with  . Diabetes    HPI 1-States her appetite is down and eats breakfast and dinner, and mid day snacks on crackers. She eats lots of vegestables, chicken and sometimes fish.  Her glucose has been < 100, her meter broke 2 weeks ago.  2- FU constipation secondary to ozempic is better and the Linzess 145 mg worked the best for her constipation from the samples I gave her last time, would like rx sent.  Past Medical History:  Diagnosis Date  . Arthritis    "all over"  . Chronic diastolic CHF (congestive heart failure) ([place])   . Chronic lower back pain   . CKD (chronic kidney disease), stage III   . Coronary artery disease    a. status post DES to the LAD 12/2015.  . Glaucoma   . Glaucoma of both eyes   . Hyperkalemia   . Hyperlipidemia   . Hypertension   . Type II diabetes mellitus (HCC)      Family History  Problem Relation Age of Onset  . Heart disease Mother   . Heart attack Mother   . Hypertension Mother   . Heart disease Sister   . Cancer Sister   . Stroke Sister      Current Outpatient Medications:  .  amLODipine (NORVASC) 10 MG tablet, Take 1 tablet (10 mg total) by mouth daily., Disp: 90 tablet, Rfl: 0 .  aspirin EC 81 MG tablet, Take 81 mg by mouth daily., Disp: , Rfl:  .  atorvastatin (LIPITOR) 80 MG tablet, TAKE 1 TABLET BY MOUTH DAILY AT 6PM, Disp: 90 tablet, Rfl: 1 .  BD PEN NEEDLE NANO U/F 32G X 4 MM MISC, USE NIGHTLY, Disp: 100 each, Rfl: 1 .  brinzolamide (AZOPT) 1 % ophthalmic suspension, Place 1 drop into both eyes 3  (three) times daily. , Disp: , Rfl:  .  Cholecalciferol (NATURAL VITAMIN D-3) 5000 units TABS, Take 1 tablet by mouth daily., Disp: , Rfl:  .  ezetimibe (ZETIA) 10 MG tablet, TAKE 1 TABLET BY MOUTH ONCE DAILY, Disp: 90 tablet, Rfl: 2 .  furosemide (LASIX) 40 MG tablet, TAKE 1 TABLET BY MOUTH EVERY DAY, Disp: 90 tablet, Rfl: 2 .  insulin degludec (TRESIBA FLEXTOUCH) 100 UNIT/ML SOPN FlexTouch Pen, Inject 0.1 mLs (10 Units total) into the skin daily., Disp: 5 pen, Rfl: 3 .  levothyroxine (SYNTHROID) 50 MCG tablet, TAKE 1 TABLET BY MOUTH DAILY BEFORE BREAKFAST, Disp: 90 tablet, Rfl: 1 .  linaclotide (LINZESS) 72 MCG capsule, 1 qd  For constipation, if this does not help  Then increase to the 145 mg sample I gave her and needs to call us which one helped to send rx. 8 samples of each given, Disp: 8 capsule, Rfl: 0 .  losartan (COZAAR) 25 MG tablet, TAKE 1 TABLET BY MOUTH EVERY DAY, Disp: 90 tablet, Rfl: 2 .  nebivolol (BYSTOLIC) 2.5 MG tablet, Take 1 tablet (2.5 mg total) by mouth daily., Disp: 90 tablet, Rfl: 1 .  Semaglutide,0.25 or 0.5MG /DOS, (OZEMPIC, 0.25 OR 0.5 MG/DOSE,) 2 MG/1.5ML SOPN,  Inject 0.25 mg into the skin once a week., Disp: 3 pen, Rfl: 1 .  traMADol-acetaminophen (ULTRACET) 37.5-325 MG tablet, Take 1 tablet by mouth every 6 (six) hours as needed for moderate pain (prn back pain)., Disp: 30 tablet, Rfl: 0 .  ciprofloxacin (CIPRO) 250 MG tablet, Take 1 tablet (250 mg total) by mouth 2 (two) times daily. (Patient not taking: Reported on 08/25/2019), Disp: 10 tablet, Rfl: 0 .  Latanoprostene Bunod (VYZULTA) 0.024 % SOLN, Apply 1 drop to eye daily., Disp: , Rfl:  .  ONETOUCH VERIO test strip, CHECK BLOOD SUGARS ONCE DAILY, Disp: 50 strip, Rfl: 11 .  polyethylene glycol (MIRALAX) 17 g packet, Take 17 g by mouth daily., Disp: 100 each, Rfl: 2 .  sitaGLIPtin (JANUVIA) 50 MG tablet, Take 1 tablet (50 mg total) by mouth daily. (Patient not taking: Reported on 08/25/2019), Disp: 30 tablet, Rfl: 0    Allergies  Allergen Reactions  . Carvedilol Cough    Pt reports causes her to cough  . Lisinopril Cough    REPORTS CAUSES DRY COUGH     Review of Systems  Review of Systems  Constitutional: Negative for diaphoresis and unexpected weight change.  HENT: Negative for tinnitus.   Eyes: Negative for visual disturbance.  Respiratory: Negative for chest tightness and shortness of breath.   Cardiovascular: Negative for chest pain, palpitations and leg swelling.  Gastrointestinal: Negative for constipation, diarrhea and nausea.  Endocrine: Negative for polydipsia, polyphagia and polyuria.  Genitourinary: Negative for dysuria and frequency.  Skin: Negative for rash and wound.  Neurological: Negative for dizziness, speech difficulty, weakness, numbness and headaches.   Today's Vitals   08/25/19 0947  BP: 132/82  Pulse: 76  Temp: 97.7 F (36.5 C)  TempSrc: Oral  Weight: 296 lb (134.3 kg)  Height: 5' 2.8" (1.595 m)  PainSc: 0-No pain   Body mass index is 52.77 kg/m.   Objective:  Physical Exam  Her weight is up 3 lbs since August Constitutional: She is oriented to person, place, and time. She appears well-developed and well-nourished. No distress. Uses a walker HENT:  Head: Normocephalic and atraumatic.  Right Ear: External ear normal.  Left Ear: External ear normal.  Nose: Nose normal.  Eyes: Conjunctivae are normal. Right eye exhibits no discharge. Left eye exhibits no discharge. No scleral icterus.  Neck: Neck supple. No thyromegaly present.  No carotid bruits bilaterally  Cardiovascular: Normal rate and regular rhythm.  No murmur heard. Pulmonary/Chest: Effort normal and breath sounds normal. No respiratory distress.  Musculoskeletal: Normal range of motion. She exhibits no edema.  Lymphadenopathy:    She has no cervical adenopathy.  Neurological: She is alert and oriented to person, place, and time.  Skin: Skin is warm and dry. Capillary refill takes less than 2  seconds. No rash noted. She is not diaphoretic.  Psychiatric: She has a normal mood and affect. Her behavior is normal. Judgment and thought content normal.  Nursing note reviewed.     Assessment And Plan:     1. Essential hypertension- controlled. May continue current medication  - CBC no Diff  2. Diabetes mellitus due to underlying condition with stage 3a chronic kidney disease, with long-term current use of insulin (HCC)- improving control. FU 3 months.  - Hemoglobin A1c  3. Other specified hypothyroidism- chronic. If TSH is normal, may continue same medication dose.  - TSH - CMP14 + Anion Gap  4. Class 3 severe obesity due to excess calories with serious comorbidity in adult,  unspecified BMI (HCC)- chronic. Encouraged to keep on working on wt loss.   5. Constipation, unspecified constipation type- improved with medication.       Linzess rx sent.     Anis Degidio RODRIGUEZ-SOUTHWORTH, PA-C    THE PATIENT IS ENCOURAGED TO PRACTICE SOCIAL DISTANCING DUE TO THE COVID-19 PANDEMIC.

## 2019-08-26 LAB — CMP14 + ANION GAP
ALT: 17 IU/L (ref 0–32)
AST: 18 IU/L (ref 0–40)
Albumin/Globulin Ratio: 1.5 (ref 1.2–2.2)
Albumin: 4.1 g/dL (ref 3.6–4.6)
Alkaline Phosphatase: 84 IU/L (ref 39–117)
Anion Gap: 15 mmol/L (ref 10.0–18.0)
BUN/Creatinine Ratio: 14 (ref 12–28)
BUN: 20 mg/dL (ref 8–27)
Bilirubin Total: 0.5 mg/dL (ref 0.0–1.2)
CO2: 24 mmol/L (ref 20–29)
Calcium: 9.7 mg/dL (ref 8.7–10.3)
Chloride: 102 mmol/L (ref 96–106)
Creatinine, Ser: 1.41 mg/dL — ABNORMAL HIGH (ref 0.57–1.00)
GFR calc Af Amer: 40 mL/min/{1.73_m2} — ABNORMAL LOW (ref >59)
GFR calc non Af Amer: 35 mL/min/{1.73_m2} — ABNORMAL LOW (ref >59)
Globulin, Total: 2.7 g/dL (ref 1.5–4.5)
Glucose: 144 mg/dL — ABNORMAL HIGH (ref 65–99)
Potassium: 4.7 mmol/L (ref 3.5–5.2)
Sodium: 141 mmol/L (ref 134–144)
Total Protein: 6.8 g/dL (ref 6.0–8.5)

## 2019-08-26 LAB — CBC
Hematocrit: 38.8 % (ref 34.0–46.6)
Hemoglobin: 12.6 g/dL (ref 11.1–15.9)
MCH: 28.4 pg (ref 26.6–33.0)
MCHC: 32.5 g/dL (ref 31.5–35.7)
MCV: 88 fL (ref 79–97)
Platelets: 209 10*3/uL (ref 150–450)
RBC: 4.43 x10E6/uL (ref 3.77–5.28)
RDW: 13.1 % (ref 11.7–15.4)
WBC: 5.1 10*3/uL (ref 3.4–10.8)

## 2019-08-26 LAB — HEMOGLOBIN A1C
Est. average glucose Bld gHb Est-mCnc: 160 mg/dL
Hgb A1c MFr Bld: 7.2 % — ABNORMAL HIGH (ref 4.8–5.6)

## 2019-08-26 LAB — TSH: TSH: 1.86 u[IU]/mL (ref 0.450–4.500)

## 2019-08-26 MED ORDER — BLOOD GLUCOSE MONITOR KIT: PACK | 0 refills | Status: DC

## 2019-08-30 ENCOUNTER — Other Ambulatory Visit: Payer: Self-pay | Admitting: Internal Medicine

## 2019-08-30 DIAGNOSIS — Z719 Counseling, unspecified: Secondary | ICD-10-CM | POA: Diagnosis not present

## 2019-09-01 ENCOUNTER — Other Ambulatory Visit: Payer: Self-pay | Admitting: Cardiology

## 2019-09-01 ENCOUNTER — Telehealth: Payer: Self-pay

## 2019-09-01 ENCOUNTER — Other Ambulatory Visit: Payer: Self-pay

## 2019-09-01 MED ORDER — LINACLOTIDE 145 MCG PO CAPS: 145.0000 ug | ORAL_CAPSULE | Freq: Every day | ORAL | 0 refills | Status: DC

## 2019-09-01 MED ORDER — BLOOD GLUCOSE MONITOR KIT: PACK | 0 refills | Status: DC

## 2019-09-01 NOTE — Telephone Encounter (Signed)
Spoke w/pt gave her provider message  Rodriguez-Southworth, Sandrea Matte  Candiss Norse T, CMA  Please inform pt that her DM has improved from 8.8 to 7.2( good job!). Her cell count and thyroid are normal. May continue same dose of thyroid medication. Her liver function is normal, her kidney function is still slow, but stable, not any worse.

## 2019-09-13 ENCOUNTER — Other Ambulatory Visit: Payer: Self-pay | Admitting: Internal Medicine

## 2019-09-15 DIAGNOSIS — H35372 Puckering of macula, left eye: Secondary | ICD-10-CM | POA: Diagnosis not present

## 2019-09-15 DIAGNOSIS — H401131 Primary open-angle glaucoma, bilateral, mild stage: Secondary | ICD-10-CM | POA: Diagnosis not present

## 2019-09-15 DIAGNOSIS — E113291 Type 2 diabetes mellitus with mild nonproliferative diabetic retinopathy without macular edema, right eye: Secondary | ICD-10-CM | POA: Diagnosis not present

## 2019-10-04 ENCOUNTER — Other Ambulatory Visit: Payer: Self-pay | Admitting: Internal Medicine

## 2019-10-11 ENCOUNTER — Ambulatory Visit
Admission: RE | Admit: 2019-10-11 | Discharge: 2019-10-11 | Disposition: A | Discharge status: Home / Self Care | Payer: Medicare Other | Source: Ambulatory Visit | Attending: Internal Medicine | Admitting: Internal Medicine

## 2019-10-11 ENCOUNTER — Other Ambulatory Visit: Payer: Self-pay

## 2019-10-11 DIAGNOSIS — E2839 Other primary ovarian failure: Secondary | ICD-10-CM

## 2019-10-11 DIAGNOSIS — Z780 Asymptomatic menopausal state: Secondary | ICD-10-CM | POA: Diagnosis not present

## 2019-10-17 ENCOUNTER — Other Ambulatory Visit: Payer: Self-pay | Admitting: Internal Medicine

## 2019-10-18 ENCOUNTER — Telehealth: Payer: Self-pay

## 2019-10-18 NOTE — Telephone Encounter (Signed)
Rodriguez-Southworth, Nettie Elm, PA-C  Jonelle Sidle T, CMA  Please inform her that her dexa was normal,   Spoke with pt gave her provider message

## 2019-11-14 ENCOUNTER — Other Ambulatory Visit: Payer: Self-pay | Admitting: Cardiology

## 2019-11-14 ENCOUNTER — Other Ambulatory Visit: Payer: Self-pay | Admitting: Internal Medicine

## 2019-11-24 ENCOUNTER — Other Ambulatory Visit: Payer: Self-pay | Admitting: Internal Medicine

## 2019-12-01 ENCOUNTER — Other Ambulatory Visit: Payer: Self-pay

## 2019-12-01 ENCOUNTER — Ambulatory Visit (INDEPENDENT_AMBULATORY_CARE_PROVIDER_SITE_OTHER): Payer: Medicare Other | Admitting: Internal Medicine

## 2019-12-01 ENCOUNTER — Other Ambulatory Visit: Payer: Medicare Other | Admitting: Internal Medicine

## 2019-12-01 VITALS — BP 132/86 | HR 77 | Temp 98.2°F | Ht 62.8 in | Wt 296.0 lb

## 2019-12-01 DIAGNOSIS — E0822 Diabetes mellitus due to underlying condition with diabetic chronic kidney disease: Secondary | ICD-10-CM

## 2019-12-01 DIAGNOSIS — E0821 Diabetes mellitus due to underlying condition with diabetic nephropathy: Secondary | ICD-10-CM

## 2019-12-01 DIAGNOSIS — R252 Cramp and spasm: Secondary | ICD-10-CM | POA: Diagnosis not present

## 2019-12-01 DIAGNOSIS — Z794 Long term (current) use of insulin: Secondary | ICD-10-CM

## 2019-12-01 DIAGNOSIS — I2510 Atherosclerotic heart disease of native coronary artery without angina pectoris: Secondary | ICD-10-CM

## 2019-12-01 DIAGNOSIS — E782 Mixed hyperlipidemia: Secondary | ICD-10-CM | POA: Diagnosis not present

## 2019-12-01 DIAGNOSIS — G8929 Other chronic pain: Secondary | ICD-10-CM | POA: Diagnosis not present

## 2019-12-01 DIAGNOSIS — I5032 Chronic diastolic (congestive) heart failure: Secondary | ICD-10-CM

## 2019-12-01 DIAGNOSIS — M545 Low back pain, unspecified: Secondary | ICD-10-CM

## 2019-12-01 DIAGNOSIS — N1831 Chronic kidney disease, stage 3a: Secondary | ICD-10-CM

## 2019-12-01 DIAGNOSIS — I10 Essential (primary) hypertension: Secondary | ICD-10-CM

## 2019-12-01 DIAGNOSIS — Z6841 Body Mass Index (BMI) 40.0 and over, adult: Secondary | ICD-10-CM

## 2019-12-01 DIAGNOSIS — I209 Angina pectoris, unspecified: Secondary | ICD-10-CM

## 2019-12-01 MED ORDER — TRAMADOL-ACETAMINOPHEN 37.5-325 MG PO TABS: 1.0000 | ORAL_TABLET | Freq: Four times a day (QID) | ORAL | 0 refills | Status: DC | PRN

## 2019-12-01 NOTE — Progress Notes (Signed)
This visit occurred during the SARS-CoV-2 public health emergency.  Safety protocols were in place, including screening questions prior to the visit, additional usage of staff PPE, and extensive cleaning of exam room while observing appropriate contact time as indicated for disinfecting solutions.  Subjective:     Patient ID: Sarah Weaver , female    DOB: Nov 22, 1935 , 84 y.o.   MRN: [id number]   Chief Complaint  Patient presents with  . Hypertension    HPI 1- Here for HTN FU. She does not check her BP at home.  2- Needs Tramadol refilled. She  Rarely uses for back pain. Last time she refilled it for # 30 was 02/02/2019.   3- Here for DM follow. Her fasting glucose have been from 79-89  4- gets hand cramps when she uses for cooking or cutting and is having spasms R>L, and sometimes at rest.   She also wanted me to know, that she does not want to see anyone else by me and if she finds out she was rescheduled with a different provider, she will reschedule.   Past Medical History:  Diagnosis Date  . Arthritis    "all over"  . Chronic diastolic CHF (congestive heart failure) ([place])   . Chronic lower back pain   . CKD (chronic kidney disease), stage III   . Coronary artery disease    a. status post DES to the LAD 12/2015.  . Glaucoma   . Glaucoma of both eyes   . Hyperkalemia   . Hyperlipidemia   . Hypertension   . Type II diabetes mellitus (HCC)      Family History  Problem Relation Age of Onset  . Heart disease Mother   . Heart attack Mother   . Hypertension Mother   . Heart disease Sister   . Cancer Sister   . Stroke Sister      Current Outpatient Medications:  .  amLODipine (NORVASC) 10 MG tablet, TAKE 1 TABLET BY MOUTH EVERY DAY, Disp: 90 tablet, Rfl: 0 .  aspirin EC 81 MG tablet, Take 81 mg by mouth daily., Disp: , Rfl:  .  atorvastatin (LIPITOR) 80 MG tablet, TAKE 1 TABLET BY MOUTH DAILY AT 6PM, Disp: 90 tablet, Rfl: 1 .  BD PEN NEEDLE NANO U/F 32G  X 4 MM MISC, USE NIGHTLY AS DIRECTED, Disp: 100 each, Rfl: 1 .  blood glucose meter kit and supplies KIT, Dispense based on patient and insurance preference. Use up to four times daily as directed. (FOR ICD-9 250.00, 250.01)., Disp: 1 each, Rfl: 0 .  brinzolamide (AZOPT) 1 % ophthalmic suspension, Place 1 drop into both eyes 3 (three) times daily. , Disp: , Rfl:  .  Cholecalciferol (NATURAL VITAMIN D-3) 5000 units TABS, Take 1 tablet by mouth daily., Disp: , Rfl:  .  ezetimibe (ZETIA) 10 MG tablet, TAKE 1 TABLET BY MOUTH ONCE DAILY, Disp: 90 tablet, Rfl: 2 .  furosemide (LASIX) 40 MG tablet, TAKE 1 TABLET BY MOUTH EVERY DAY, Disp: 90 tablet, Rfl: 1 .  insulin degludec (TRESIBA FLEXTOUCH) 100 UNIT/ML SOPN FlexTouch Pen, Inject 0.1 mLs (10 Units total) into the skin daily., Disp: 5 pen, Rfl: 3 .  Latanoprostene Bunod (VYZULTA) 0.024 % SOLN, Apply 1 drop to eye daily., Disp: , Rfl:  .  levothyroxine (SYNTHROID) 50 MCG tablet, TAKE 1 TABLET BY MOUTH EVERY DAY BEFORE BREAKFAST, Disp: 90 tablet, Rfl: 1 .  LINZESS 145 MCG CAPS capsule, TAKE 1 CAPSULE (145 MCG TOTAL) BY MOUTH  DAILY BEFORE BREAKFAST. FOR CONSTIPATION, Disp: 90 capsule, Rfl: 0 .  losartan (COZAAR) 25 MG tablet, TAKE 1 TABLET BY MOUTH EVERY DAY, Disp: 90 tablet, Rfl: 2 .  nebivolol (BYSTOLIC) 2.5 MG tablet, Take 1 tablet (2.5 mg total) by mouth daily., Disp: 90 tablet, Rfl: 1 .  ONETOUCH VERIO test strip, CHECK BLOOD SUGARS ONCE DAILY, Disp: 50 strip, Rfl: 11 .  OZEMPIC, 0.25 OR 0.5 MG/DOSE, 2 MG/1.5ML SOPN, INJECT 0.25 MG INTO THE SKIN ONCE A WEEK., Disp: 1 pen, Rfl: 1 .  traMADol-acetaminophen (ULTRACET) 37.5-325 MG tablet, Take 1 tablet by mouth every 6 (six) hours as needed for moderate pain (prn back pain)., Disp: 30 tablet, Rfl: 0   Allergies  Allergen Reactions  . Carvedilol Cough    Pt reports causes her to cough  . Lisinopril Cough    REPORTS CAUSES DRY COUGH     Review of Systems  Review of Systems  Constitutional: Negative  for diaphoresis and unexpected weight change.  HENT: Negative for tinnitus.   Eyes: Negative for visual disturbance.  Respiratory: Negative for chest tightness and shortness of breath.   Cardiovascular: Negative for chest pain, palpitations and leg swelling. Has mild SOB, but not more than her usual.  Gastrointestinal: Negative for constipation, diarrhea and nausea.  Endocrine: Negative for polydipsia, polyphagia and polyuria.  Genitourinary: Negative for dysuria and frequency.  Skin: Negative for rash and wound.  Neurological: Negative for dizziness, speech difficulty, weakness, numbness and headaches.    Today's Vitals   12/01/19 0832  BP: 132/86  Pulse: 77  Temp: 98.2 F (36.8 C)  TempSrc: Oral  SpO2: 94%  Weight: 296 lb (134.3 kg)  Height: 5' 2.8" (1.595 m)   Body mass index is 52.77 kg/m.   Objective:  Physical Exam  Wt is unchanged  Constitutional: She is oriented to person, place, and time. She appears well-developed and well-nourished. No distress.  HENT:  Head: Normocephalic and atraumatic.  Right Ear: External ear normal.  Left Ear: External ear normal.  Nose: Nose normal.  Eyes: Conjunctivae are normal. Right eye exhibits no discharge. Left eye exhibits no discharge. No scleral icterus.  Neck: Neck supple. No thyromegaly present.  No carotid bruits bilaterally  Cardiovascular: Normal rate and regular rhythm. Has edmea but slept on the recliner No murmur heard. Pulmonary/Chest: Effort normal and breath sounds normal. No respiratory distress.  Musculoskeletal: Normal range of motion. Has +2/4 pitting edema and admits falling asleep on her recliner and did not go back to bed.   Lymphadenopathy:    She has no cervical adenopathy.  Neurological: She is alert and oriented to person, place, and time.  Skin: Skin is warm and dry. Capillary refill takes less than 2 seconds. No rash noted. She is not diaphoretic.  Psychiatric: She has a normal mood and affect. Her  behavior is normal. Judgment and thought content normal.  Nursing note reviewed.   Assessment And Plan:     1. Chronic bilateral low back pain without sciatica- intermittent and chronic. I refilled her Tramadol since she rarely takes it.  - traMADol-acetaminophen (ULTRACET) 37.5-325 MG tablet; Take 1 tablet by mouth every 6 (six) hours as needed for moderate pain (prn back pain).  Dispense: 30 tablet; Refill: 0  2. Mixed hyperlipidemia-chronic. Stable on current meds. Will continue Fu with cardiologist.   3. Essential hypertension- controlled. Fu in 3 months.  - CMP14 + Anion Gap - CBC no Diff  4. Diabetes mellitus due to underlying condition with stage  3a chronic kidney disease, with long-term current use of insulin ([place])- chronic. May continue current medication and low carb diet. Fu in 3 months - Hemoglobin A1c  5. Hand cramps- unknown cause. Advised to take a multivitamin with minerals.  - Magnesium  6. Class 3 severe obesity due to excess calories with serious comorbidity in adult, unspecified BMI (HCC) chronic. - now that will get better weather, will start walking again.   7. Stage 3a chronic kidney disease- chronic. Will continue Fu with nephrology.   8. Coronary artery disease involving native coronary artery of native heart without angina pectoris- stable. Will continue Fu with cardiology.      Jermani Eberlein RODRIGUEZ-SOUTHWORTH, PA-C    THE PATIENT IS ENCOURAGED TO PRACTICE SOCIAL DISTANCING DUE TO THE COVID-19 PANDEMIC.

## 2019-12-01 NOTE — Patient Instructions (Signed)
Take a multivitamin with minerals with your vitamin D.

## 2019-12-02 LAB — CBC
Hematocrit: 37.3 % (ref 34.0–46.6)
Hemoglobin: 12.1 g/dL (ref 11.1–15.9)
MCH: 28.4 pg (ref 26.6–33.0)
MCHC: 32.4 g/dL (ref 31.5–35.7)
MCV: 88 fL (ref 79–97)
Platelets: 223 10*3/uL (ref 150–450)
RBC: 4.26 x10E6/uL (ref 3.77–5.28)
RDW: 13 % (ref 11.7–15.4)
WBC: 3.8 10*3/uL (ref 3.4–10.8)

## 2019-12-02 LAB — CMP14 + ANION GAP
ALT: 18 IU/L (ref 0–32)
AST: 18 IU/L (ref 0–40)
Albumin/Globulin Ratio: 1.8 (ref 1.2–2.2)
Albumin: 4.2 g/dL (ref 3.6–4.6)
Alkaline Phosphatase: 102 IU/L (ref 39–117)
Anion Gap: 15 mmol/L (ref 10.0–18.0)
BUN/Creatinine Ratio: 11 — ABNORMAL LOW (ref 12–28)
BUN: 14 mg/dL (ref 8–27)
Bilirubin Total: 0.5 mg/dL (ref 0.0–1.2)
CO2: 22 mmol/L (ref 20–29)
Calcium: 9.7 mg/dL (ref 8.7–10.3)
Chloride: 105 mmol/L (ref 96–106)
Creatinine, Ser: 1.28 mg/dL — ABNORMAL HIGH (ref 0.57–1.00)
GFR calc Af Amer: 45 mL/min/{1.73_m2} — ABNORMAL LOW (ref >59)
GFR calc non Af Amer: 39 mL/min/{1.73_m2} — ABNORMAL LOW (ref >59)
Globulin, Total: 2.3 g/dL (ref 1.5–4.5)
Glucose: 162 mg/dL — ABNORMAL HIGH (ref 65–99)
Potassium: 4.4 mmol/L (ref 3.5–5.2)
Sodium: 142 mmol/L (ref 134–144)
Total Protein: 6.5 g/dL (ref 6.0–8.5)

## 2019-12-02 LAB — HEMOGLOBIN A1C
Est. average glucose Bld gHb Est-mCnc: 166 mg/dL
Hgb A1c MFr Bld: 7.4 % — ABNORMAL HIGH (ref 4.8–5.6)

## 2019-12-02 LAB — MAGNESIUM: Magnesium: 1.8 mg/dL (ref 1.6–2.3)

## 2019-12-11 ENCOUNTER — Other Ambulatory Visit: Payer: Self-pay | Admitting: Internal Medicine

## 2019-12-22 ENCOUNTER — Telehealth: Payer: Self-pay | Admitting: Internal Medicine

## 2019-12-22 NOTE — Telephone Encounter (Signed)
Patient c/o Palpitations:  High priority if patient c/o lightheadedness, shortness of breath, or chest pain  1) How long have you had palpitations/irregular HR/ Afib? Are you having the symptoms now?  Fluttering right now and heart skipping beats  2) Are you currently experiencing lightheadedness, SOB or CP?  Dizziness, not at this time, short of breath now  3) Do you have a history of afib (atrial fibrillation) or irregular heart rhythm?  no  4) Have you checked your BP or HR? (document readings if available):  Have not been taking it  5) Are you experiencing any other symptoms? No- I made pt a Virtual  Visit for Monday with Tereso Newcomer

## 2019-12-22 NOTE — Telephone Encounter (Signed)
Pt called to report that she has been having palpitations and fluttering in her chest for the past 24 hours and has dizziness and feeling to pass out associated with them.   She has not had any changes in her medical history and no diet changes to suggest she could be dehydrated or any other possible causes.   She says she is short of breath now and having the fluttering at this moment and it has been more frequent this afternoon. She says she can feel her heart "skip a beat".   I have advised her that she may be having a serious arrhythmia and since she is having it now.. she should call EMS and have them place her on a monitor to see if they can capture what she is feeling and she should consider going to the ED for continuous monitoring and possible treatment if needed.   Pt agreed and calling EMS.

## 2019-12-25 NOTE — Progress Notes (Addendum)
Virtual Visit via Telephone Note   This visit type was conducted due to national recommendations for restrictions regarding the COVID-19 Pandemic (e.g. social distancing) in an effort to limit this patient's exposure and mitigate transmission in our community.  Due to her co-morbid illnesses, this patient is at least at moderate risk for complications without adequate follow up.  This format is felt to be most appropriate for this patient at this time.  The patient did not have access to video technology/had technical difficulties with video requiring transitioning to audio format only (telephone).  All issues noted in this document were discussed and addressed.  No physical exam could be performed with this format.  Please refer to the patient's chart for her  consent to telehealth for [facility].   The patient was identified using 2 identifiers.  Date:  12/26/2019   ID:  Sarah Weaver, DOB 08-16-1936, MRN [id number]  Patient Location: Home Provider Location: Home  PCP:  Shelby Mattocks, PA-C  Cardiologist:  Ena Dawley, MD   Electrophysiologist:  None   Evaluation Performed:  Follow-Up Visit  Chief Complaint: Palpitations, dizziness, shortness of breath  Patient Profile: Sarah Weaver is a 84 y.o. female with   Coronary artery disease  [place]:  S/p DES to the LAD 4/17  Hypertension  Diabetes mellitus type 2  Hyperlipidemia  Glaucoma  Morbid obesity  Chronic kidney disease, stage III  History of hyperkalemia  Chronic diastolic CHF  Prior CV Studies: Echocardiogram 12/21/2015 Mild LVH, EF 55-60, normal wall motion, GR 1 DD, MAC  Cardiac catheterization 12/20/2015 LAD ostial 25, mid 99; D2 ostial 40 LCx proximal 40 PCI: 2.5 x 20 mm Synergy DES to the mid LAD  History of Present Illness:   Sarah Weaver was last seen via telemedicine by Melina Copa, PA-C.  She called in 12/22/19 with complaints of palpitations and dizziness  as well as shortness of breath.  She reported near syncope.  She was asked to call EMS for further evaluation.    She started to have symptoms of palpitations last week.  She has a skipping sensation.  She has not had rapid palpitations.  She does feel lightheaded at times.  She does note shortness of breath with mild to moderate activities.  She typically sleeps on 2 pillows.  Recently she has had to increase to 3 pillows due to shortness of breath.  She does describe what sounds like PND.  She has noted some swelling in her feet.  Her weights have been stable.  She has chronic chest soreness without significant change.  She has no other chest discomfort.  She has not had syncope.  Past Medical History:  Diagnosis Date  . Arthritis    "all over"  . Chronic diastolic CHF (congestive heart failure) ([place])   . Chronic lower back pain   . CKD (chronic kidney disease), stage III   . Coronary artery disease    a. status post DES to the LAD 12/2015.  . Glaucoma   . Glaucoma of both eyes   . Hyperkalemia   . Hyperlipidemia   . Hypertension   . Type II diabetes mellitus ([place])    Past Surgical History:  Procedure Laterality Date  . CARDIAC CATHETERIZATION N/A 12/20/2015   Procedure: Left Heart Cath and Coronary Angiography;  Surgeon: Lorretta Harp, MD;  Location: [place] [facility];  Service: Cardiovascular;  Laterality: N/A;  . CARDIAC CATHETERIZATION N/A 12/20/2015   Procedure: Coronary Stent Intervention;  Surgeon: Roderic Palau  Adora Fridge, MD;  Location: [place] [facility];  Service: Cardiovascular;  Laterality: N/A;  mid LAD 2.5 x 20 Synergy  . CATARACT EXTRACTION W/ INTRAOCULAR LENS  IMPLANT, BILATERAL Bilateral   . CORONARY ANGIOPLASTY WITH STENT PLACEMENT  12/20/2015  . LAPAROSCOPIC CHOLECYSTECTOMY  04/23/11  . TONSILLECTOMY       Current Meds  Medication Sig  . amLODipine (NORVASC) 10 MG tablet TAKE 1 TABLET BY MOUTH EVERY DAY  . aspirin EC 81 MG tablet Take 81 mg by mouth daily.  Marland Kitchen atorvastatin  (LIPITOR) 80 MG tablet TAKE 1 TABLET BY MOUTH DAILY AT 6PM  . BD PEN NEEDLE NANO U/F 32G X 4 MM MISC USE NIGHTLY AS DIRECTED  . blood glucose meter kit and supplies KIT Dispense based on patient and insurance preference. Use up to four times daily as directed. (FOR ICD-9 250.00, 250.01).  Marland Kitchen brinzolamide (AZOPT) 1 % ophthalmic suspension Place 1 drop into both eyes 3 (three) times daily.   . Cholecalciferol (NATURAL VITAMIN D-3) 5000 units TABS Take 1 tablet by mouth daily.  Marland Kitchen ezetimibe (ZETIA) 10 MG tablet Take 1 tablet (10 mg total) by mouth daily.  . furosemide (LASIX) 40 MG tablet TAKE 1 TABLET BY MOUTH EVERY DAY  . insulin degludec (TRESIBA FLEXTOUCH) 100 UNIT/ML SOPN FlexTouch Pen Inject 0.1 mLs (10 Units total) into the skin daily.  . Latanoprostene Bunod (VYZULTA) 0.024 % SOLN Apply 1 drop to eye daily.  Marland Kitchen levothyroxine (SYNTHROID) 50 MCG tablet TAKE 1 TABLET BY MOUTH EVERY DAY BEFORE BREAKFAST  . LINZESS 145 MCG CAPS capsule TAKE 1 CAPSULE (145 MCG TOTAL) BY MOUTH DAILY BEFORE BREAKFAST. FOR CONSTIPATION  . losartan (COZAAR) 25 MG tablet TAKE 1 TABLET BY MOUTH EVERY DAY  . ONETOUCH VERIO test strip CHECK BLOOD SUGARS ONCE DAILY  . OZEMPIC, 0.25 OR 0.5 MG/DOSE, 2 MG/1.5ML SOPN INJECT 0.25 MG INTO THE SKIN ONCE A WEEK.  . traMADol-acetaminophen (ULTRACET) 37.5-325 MG tablet Take 1 tablet by mouth every 6 (six) hours as needed for moderate pain (prn back pain).  . [DISCONTINUED] ezetimibe (ZETIA) 10 MG tablet TAKE 1 TABLET BY MOUTH ONCE DAILY     Allergies:   Carvedilol and Lisinopril   Social History   Tobacco Use  . Smoking status: Never Smoker  . Smokeless tobacco: Never Used  Substance Use Topics  . Alcohol use: No  . Drug use: No     Family Hx: The patient's family history includes Cancer in her sister; Heart attack in her mother; Heart disease in her mother and sister; Hypertension in her mother; Stroke in her sister.  ROS:   Please see the history of present  illness.   Labs/Other Tests and Data Reviewed:    EKG:  No ECG reviewed.  Recent Labs: 08/25/2019: TSH 1.860 12/01/2019: ALT 18; BUN 14; Creatinine, Ser 1.28; Hemoglobin 12.1; Magnesium 1.8; Platelets 223; Potassium 4.4; Sodium 142   Recent Lipid Panel Lab Results  Component Value Date/Time   CHOL 115 05/05/2019 02:34 PM   TRIG 78 05/05/2019 02:34 PM   HDL 45 05/05/2019 02:34 PM   CHOLHDL 2.6 05/05/2019 02:34 PM   CHOLHDL 3.3 12/21/2015 04:00 AM   LDLCALC 54 05/05/2019 02:34 PM    Wt Readings from Last 3 Encounters:  12/26/19 296 lb (134.3 kg)  12/01/19 296 lb (134.3 kg)  08/25/19 296 lb (134.3 kg)     Objective:    Vital Signs:  BP 132/86   Ht 5' 2.8" (1.595 m)  Wt 296 lb (134.3 kg)   BMI 52.77 kg/m    VITAL SIGNS:  reviewed GEN:  no acute distress RESPIRATORY:  No labored breathing NEURO:  Alert and oriented PSYCH:  normal affect  ASSESSMENT & PLAN:    1. Palpitations 2. Shortness of breath 3. Dizziness She notes palpitations described as a skipping sensation since last week.  She does note some issues with anxiety.  She has also had some swelling in her feet as well as questionable orthopnea/PND.  However, her weights have been stable.  She does have shortness of breath and describes NYHA IIb-IIIa symptoms.  However, her dyspnea seems to be stable.  It is difficult to know the cause of her symptoms.  She did have EMS come up to her house last week and was told that her EKG was normal.  She may be experiencing PVCs.  However, significant arrhythmia needs to be ruled out.  She may also be experiencing volume excess.  -Obtain BMET, CBC, BNP, TSH, magnesium  -Obtain echocardiogram  -Obtain Zio AT 14 day monitor  -Follow-up in person in 2 weeks  -I advised the patient to follow-up with PCP (?  Anxiety)  4. Coronary artery disease involving native coronary artery of native heart without angina pectoris History of drug-eluting stent to the LAD in April 2017.  She  has chronic chest symptoms but no significant change.  At this point, I do not believe that she needs ischemic testing.  Continue current dose of amlodipine, aspirin, atorvastatin, ezetimibe, losartan.  5. Chronic diastolic CHF (congestive heart failure) ([place]) Echocardiogram April 2017 with EF 55-60 and grade 1 diastolic dysfunction.  As noted, she does have some symptoms that sound consistent with volume excess.  However, her weights have been stable.  Obtain BNP as noted.  If her BNP is significantly elevated, I will adjust her furosemide.  6. Stage 3a chronic kidney disease Obtain follow-up BMET.  Most recent creatinine 1.28.  7. Essential hypertension Blood pressure close to target.  Continue current therapy.  Continue to monitor.    Time:   Today, I have spent 20 minutes with the patient with telehealth technology discussing the above problems.     Medication Adjustments/Labs and Tests Ordered: Current medicines are reviewed at length with the patient today.  Concerns regarding medicines are outlined above.   Tests Ordered: Orders Placed This Encounter  Procedures  . Basic metabolic panel  . CBC  . Magnesium  . Pro b natriuretic peptide (BNP)  . TSH  . LONG TERM MONITOR-LIVE TELEMETRY (3-14 DAYS)  . ECHOCARDIOGRAM COMPLETE    Medication Changes: Meds ordered this encounter  Medications  . ezetimibe (ZETIA) 10 MG tablet    Sig: Take 1 tablet (10 mg total) by mouth daily.    Dispense:  90 tablet    Refill:  3    Follow Up:  In Person in 2 week(s)  Signed, Richardson Dopp, PA-C  12/26/2019 4:15 PM    [facility]  Addendum Records received from [facility].  Telemetry strips and EKG personally reviewed and interpreted from 12/22/2019. Telemetry demonstrates sinus rhythm with PACs.  Electrocardiogram demonstrates sinus rhythm, HR 75, leftward axis, nonspecific ST-T wave changes, PACs, QTC 416 Richardson Dopp, PA-C    12/30/2019 8:05 AM

## 2019-12-26 ENCOUNTER — Encounter: Payer: Self-pay | Admitting: Physician Assistant

## 2019-12-26 ENCOUNTER — Telehealth: Payer: Self-pay | Admitting: *Deleted

## 2019-12-26 ENCOUNTER — Other Ambulatory Visit: Payer: Self-pay

## 2019-12-26 ENCOUNTER — Telehealth (INDEPENDENT_AMBULATORY_CARE_PROVIDER_SITE_OTHER): Payer: Medicare Other | Admitting: Physician Assistant

## 2019-12-26 VITALS — BP 132/86 | Ht 62.8 in | Wt 296.0 lb

## 2019-12-26 DIAGNOSIS — I2510 Atherosclerotic heart disease of native coronary artery without angina pectoris: Secondary | ICD-10-CM

## 2019-12-26 DIAGNOSIS — R42 Dizziness and giddiness: Secondary | ICD-10-CM

## 2019-12-26 DIAGNOSIS — I130 Hypertensive heart and chronic kidney disease with heart failure and stage 1 through stage 4 chronic kidney disease, or unspecified chronic kidney disease: Secondary | ICD-10-CM | POA: Diagnosis not present

## 2019-12-26 DIAGNOSIS — R002 Palpitations: Secondary | ICD-10-CM | POA: Diagnosis not present

## 2019-12-26 DIAGNOSIS — R0602 Shortness of breath: Secondary | ICD-10-CM | POA: Diagnosis not present

## 2019-12-26 DIAGNOSIS — I5032 Chronic diastolic (congestive) heart failure: Secondary | ICD-10-CM

## 2019-12-26 DIAGNOSIS — I10 Essential (primary) hypertension: Secondary | ICD-10-CM

## 2019-12-26 DIAGNOSIS — N1831 Chronic kidney disease, stage 3a: Secondary | ICD-10-CM

## 2019-12-26 MED ORDER — EZETIMIBE 10 MG PO TABS: 10.0000 mg | ORAL_TABLET | Freq: Every day | ORAL | 3 refills | Status: DC

## 2019-12-26 NOTE — Telephone Encounter (Signed)
Patient enrolled for 14 day ZIO AT long term monitor-Live Telemetry to be shipped to her daughters home.  [address], [address]/o [address], [place], [place] [address].   Instructions reviewed briefly with daughter.

## 2019-12-26 NOTE — Patient Instructions (Addendum)
Medication Instructions:   Your physician recommends that you continue on your current medications as directed. Please refer to the Current Medication list given to you today.  *If you need a refill on your cardiac medications before your next appointment, please call your pharmacy*  Lab Work:  Your physician recommends that you return for lab work on 12/27/19.  If you have labs (blood work) drawn today and your tests are completely normal, you will receive your results only by: Marland Kitchen MyChart Message (if you have MyChart) OR . A paper copy in the mail If you have any lab test that is abnormal or we need to change your treatment, we will call you to review the results.  Testing/Procedures:  A zio monitor was ordered today. It will remain on for 14 days. You will then return monitor and event diary in provided box. It takes 1-2 weeks for report to be downloaded and returned to [place]. We will call you with the results. If monitor falls off or has orange flashing light, please call Zio for further instructions.   Your physician has requested that you have an echocardiogram. Echocardiography is a painless test that uses sound waves to create images of your heart. It provides your doctor with information about the size and shape of your heart and how well your heart's chambers and valves are working. This procedure takes approximately one hour. There are no restrictions for this procedure.  Follow-Up: At [facility], you and your health needs are our priority.  As part of our continuing mission to provide you with exceptional heart care, we have created designated Provider Care Teams.  These Care Teams include your primary Cardiologist (physician) and Advanced Practice Providers (APPs -  Physician Assistants and Nurse Practitioners) who all work together to provide you with the care you need, when you need it.  We recommend signing up for the patient portal called "MyChart".  Sign up information is  provided on this After Visit Summary.  MyChart is used to connect with patients for Virtual Visits (Telemedicine).  Patients are able to view lab/test results, encounter notes, upcoming appointments, etc.  Non-urgent messages can be sent to your provider as well.   To learn more about what you can do with MyChart, go to [email or web address].    Your next appointment:    On 01/10/20 at 11:15AM with Jacolyn Reedy, PA-C  Other Instructions  Follow up with your PCP as well for symptoms.

## 2019-12-27 ENCOUNTER — Other Ambulatory Visit: Payer: Self-pay

## 2019-12-27 ENCOUNTER — Other Ambulatory Visit: Payer: Medicare Other

## 2019-12-27 DIAGNOSIS — R0602 Shortness of breath: Secondary | ICD-10-CM

## 2019-12-27 DIAGNOSIS — I2510 Atherosclerotic heart disease of native coronary artery without angina pectoris: Secondary | ICD-10-CM

## 2019-12-27 DIAGNOSIS — N1831 Chronic kidney disease, stage 3a: Secondary | ICD-10-CM | POA: Diagnosis not present

## 2019-12-27 DIAGNOSIS — R002 Palpitations: Secondary | ICD-10-CM | POA: Diagnosis not present

## 2019-12-27 DIAGNOSIS — I10 Essential (primary) hypertension: Secondary | ICD-10-CM

## 2019-12-27 DIAGNOSIS — I5032 Chronic diastolic (congestive) heart failure: Secondary | ICD-10-CM | POA: Diagnosis not present

## 2019-12-28 ENCOUNTER — Other Ambulatory Visit: Payer: Self-pay | Admitting: Cardiology

## 2019-12-28 LAB — BASIC METABOLIC PANEL
BUN/Creatinine Ratio: 14 (ref 12–28)
BUN: 19 mg/dL (ref 8–27)
CO2: 24 mmol/L (ref 20–29)
Calcium: 9.8 mg/dL (ref 8.7–10.3)
Chloride: 98 mmol/L (ref 96–106)
Creatinine, Ser: 1.4 mg/dL — ABNORMAL HIGH (ref 0.57–1.00)
GFR calc Af Amer: 40 mL/min/{1.73_m2} — ABNORMAL LOW (ref >59)
GFR calc non Af Amer: 35 mL/min/{1.73_m2} — ABNORMAL LOW (ref >59)
Glucose: 151 mg/dL — ABNORMAL HIGH (ref 65–99)
Potassium: 4.1 mmol/L (ref 3.5–5.2)
Sodium: 139 mmol/L (ref 134–144)

## 2019-12-28 LAB — MAGNESIUM: Magnesium: 1.8 mg/dL (ref 1.6–2.3)

## 2019-12-28 LAB — CBC
Hematocrit: 38.9 % (ref 34.0–46.6)
Hemoglobin: 12.9 g/dL (ref 11.1–15.9)
MCH: 29 pg (ref 26.6–33.0)
MCHC: 33.2 g/dL (ref 31.5–35.7)
MCV: 87 fL (ref 79–97)
Platelets: 242 10*3/uL (ref 150–450)
RBC: 4.45 x10E6/uL (ref 3.77–5.28)
RDW: 13 % (ref 11.7–15.4)
WBC: 4.8 10*3/uL (ref 3.4–10.8)

## 2019-12-28 LAB — PRO B NATRIURETIC PEPTIDE: NT-Pro BNP: 107 pg/mL (ref 0–738)

## 2019-12-28 LAB — TSH: TSH: 2.66 u[IU]/mL (ref 0.450–4.500)

## 2019-12-29 ENCOUNTER — Ambulatory Visit (INDEPENDENT_AMBULATORY_CARE_PROVIDER_SITE_OTHER): Payer: Medicare Other

## 2019-12-29 DIAGNOSIS — R002 Palpitations: Secondary | ICD-10-CM | POA: Diagnosis not present

## 2019-12-29 DIAGNOSIS — R42 Dizziness and giddiness: Secondary | ICD-10-CM | POA: Diagnosis not present

## 2019-12-29 DIAGNOSIS — R0602 Shortness of breath: Secondary | ICD-10-CM

## 2019-12-30 DIAGNOSIS — R002 Palpitations: Secondary | ICD-10-CM | POA: Diagnosis not present

## 2019-12-30 DIAGNOSIS — R0602 Shortness of breath: Secondary | ICD-10-CM | POA: Diagnosis not present

## 2019-12-30 DIAGNOSIS — R42 Dizziness and giddiness: Secondary | ICD-10-CM | POA: Diagnosis not present

## 2020-01-02 ENCOUNTER — Other Ambulatory Visit: Payer: Self-pay | Admitting: Internal Medicine

## 2020-01-10 ENCOUNTER — Ambulatory Visit: Payer: Medicare Other | Admitting: Physician Assistant

## 2020-01-12 ENCOUNTER — Other Ambulatory Visit: Payer: Self-pay

## 2020-01-12 ENCOUNTER — Ambulatory Visit (HOSPITAL_COMMUNITY): Payer: Medicare Other | Attending: Physician Assistant

## 2020-01-12 ENCOUNTER — Telehealth: Payer: Self-pay | Admitting: Physician Assistant

## 2020-01-12 DIAGNOSIS — I5032 Chronic diastolic (congestive) heart failure: Secondary | ICD-10-CM | POA: Diagnosis not present

## 2020-01-12 DIAGNOSIS — R0602 Shortness of breath: Secondary | ICD-10-CM | POA: Diagnosis not present

## 2020-01-12 MED ORDER — PERFLUTREN LIPID MICROSPHERE
1.0000 mL | INTRAVENOUS | Status: AC | PRN
  Administered 2020-01-12: 2 mL via INTRAVENOUS

## 2020-01-12 NOTE — Telephone Encounter (Signed)
Patient states she got a call today from our office.  I did not see anything in patients chart. She stated it might be her mistake they may have called her yesterday

## 2020-01-13 ENCOUNTER — Encounter: Payer: Self-pay | Admitting: Physician Assistant

## 2020-01-16 NOTE — Progress Notes (Signed)
Cardiology Office Note:    Date:  01/17/2020   ID:  [name], DOB 17-Jun-1936, MRN [id number]  PCP:  Shelby Mattocks, PA-C  Cardiologist:  Ena Dawley, MD   Electrophysiologist:  None   Referring MD: Carol Ada*   Chief Complaint:  Follow-up (palpitations, dyspnea)    Patient Profile:    Sarah Weaver is a 84 y.o. female with:   Coronary artery disease ? [place]:  S/p DES to the LAD 4/17  Hypertension  Diabetes mellitus type 2  Hyperlipidemia  Glaucoma  Morbid obesity  Chronic kidney disease, stage III ? History of hyperkalemia  Chronic diastolic CHF  Prior CV Studies: Echocardiogram 01/12/2020 EF 60-65, no RWMA, mild LVH, Gr 1 DD, trivial MR, AoV sclerosis (no AS)  Echocardiogram 12/21/2015 Mild LVH, EF 55-60, normal wall motion, GR 1 DD, MAC  Cardiac catheterization 12/20/2015 LAD ostial 25, mid 99; D2 ostial 40 LCx proximal 40 PCI: 2.5 x 20 mm Synergy DES to the mid LAD  History of Present Illness:    Sarah Weaver was last seen via Telemedicine in 12/2019 for evaluation of palpitations and shortness of breath.  She had called EMS to her house.  Her ECG was normal.  I set her up for an echocardiogram, cardiac monitor and some labs.  K+, Mg2+, TSH, BNP were all normal.  An echocardiogram demonstrated normal LVF.  Her event monitor is still pending.  She returns for follow up.  She is here alone.  She has felt well since her last visit.  She has not had chest pain and her shortness of breath is overall stable.  She sleeps on 2 pillows chronically.  Her legs are more swollen today b/c she has not taken her Furosemide yet today.  Her legs are usually smaller.  She has not had syncope.  She has not mailed her monitor in yet.      Past Medical History:  Diagnosis Date  . Arthritis    "all over"  . Chronic diastolic CHF (congestive heart failure) (HCC)    Echocardiogram 12/2019: EF 60-65, no RWMA, mild LVH, Gr 1  DD, trivial MR, mild AoV sclerosis, no AS  . Chronic lower back pain   . CKD (chronic kidney disease), stage III   . Coronary artery disease    a. status post DES to the LAD 12/2015.  . Glaucoma   . Glaucoma of both eyes   . Hyperkalemia   . Hyperlipidemia   . Hypertension   . Type II diabetes mellitus (HCC)     Current Medications: Current Meds  Medication Sig  . amLODipine (NORVASC) 10 MG tablet TAKE 1 TABLET BY MOUTH EVERY DAY  . aspirin EC 81 MG tablet Take 81 mg by mouth daily.  Marland Kitchen atorvastatin (LIPITOR) 80 MG tablet TAKE 1 TABLET BY MOUTH DAILY AT 6PM  . BD PEN NEEDLE NANO U/F 32G X 4 MM MISC USE NIGHTLY AS DIRECTED  . blood glucose meter kit and supplies KIT Dispense based on patient and insurance preference. Use up to four times daily as directed. (FOR ICD-9 250.00, 250.01).  Marland Kitchen brinzolamide (AZOPT) 1 % ophthalmic suspension Place 1 drop into both eyes 3 (three) times daily.   . Cholecalciferol (NATURAL VITAMIN D-3) 5000 units TABS Take 1 tablet by mouth daily.  Marland Kitchen ezetimibe (ZETIA) 10 MG tablet Take 1 tablet (10 mg total) by mouth daily.  . furosemide (LASIX) 40 MG tablet TAKE 1 TABLET BY MOUTH EVERY DAY  . insulin degludec (TRESIBA  FLEXTOUCH) 100 UNIT/ML SOPN FlexTouch Pen Inject 0.1 mLs (10 Units total) into the skin daily.  . Latanoprostene Bunod (VYZULTA) 0.024 % SOLN Apply 1 drop to eye daily.  Marland Kitchen levothyroxine (SYNTHROID) 50 MCG tablet TAKE 1 TABLET BY MOUTH EVERY DAY BEFORE BREAKFAST  . LINZESS 145 MCG CAPS capsule TAKE 1 CAPSULE (145 MCG TOTAL) BY MOUTH DAILY BEFORE BREAKFAST. FOR CONSTIPATION  . losartan (COZAAR) 25 MG tablet TAKE 1 TABLET BY MOUTH EVERY DAY  . ONETOUCH VERIO test strip CHECK BLOOD SUGARS ONCE DAILY  . OZEMPIC, 0.25 OR 0.5 MG/DOSE, 2 MG/1.5ML SOPN INJECT 0.25 MG INTO THE SKIN ONCE A WEEK.  . traMADol-acetaminophen (ULTRACET) 37.5-325 MG tablet Take 1 tablet by mouth every 6 (six) hours as needed for moderate pain (prn back pain).     Allergies:    Carvedilol and Lisinopril   Social History   Tobacco Use  . Smoking status: Never Smoker  . Smokeless tobacco: Never Used  Substance Use Topics  . Alcohol use: No  . Drug use: No     Family Hx: The patient's family history includes Cancer in her sister; Heart attack in her mother; Heart disease in her mother and sister; Hypertension in her mother; Stroke in her sister.  ROS   EKGs/Labs/Other Test Reviewed:    EKG:  EKG is  ordered today.  The ekg ordered today demonstrates normal sinus rhythm, heart rate 78, leftward axis, nonspecific ST-T wave changes, QTC 421, no change from prior tracing  Recent Labs: 12/01/2019: ALT 18 12/27/2019: BUN 19; Creatinine, Ser 1.40; Hemoglobin 12.9; Magnesium 1.8; NT-Pro BNP 107; Platelets 242; Potassium 4.1; Sodium 139; TSH 2.660   Recent Lipid Panel Lab Results  Component Value Date/Time   CHOL 115 05/05/2019 02:34 PM   TRIG 78 05/05/2019 02:34 PM   HDL 45 05/05/2019 02:34 PM   CHOLHDL 2.6 05/05/2019 02:34 PM   CHOLHDL 3.3 12/21/2015 04:00 AM   LDLCALC 54 05/05/2019 02:34 PM    Physical Exam:    VS:  BP (!) 144/92   Pulse 78   Ht 5' 2.8" (1.595 m)   Wt (!) 305 lb 6.4 oz (138.5 kg)   SpO2 94%   BMI 54.44 kg/m     Wt Readings from Last 3 Encounters:  01/17/20 (!) 305 lb 6.4 oz (138.5 kg)  12/26/19 296 lb (134.3 kg)  12/01/19 296 lb (134.3 kg)     Constitutional:      Appearance: Healthy appearance. Not in distress.  Neck:     Thyroid: No thyromegaly.  Pulmonary:     Breath sounds: No wheezing. No rales.  Cardiovascular:     Normal rate. Regular rhythm. Normal S1. Normal S2.     Murmurs: There is no murmur.  Edema:    Pretibial: bilateral 1+ edema of the pretibial area.    Feet: bilateral 1+ edema of the feet. Abdominal:     Palpations: Abdomen is soft.  Skin:    General: Skin is warm and dry.  Neurological:     General: No focal deficit present.     Mental Status: Alert and oriented to person, place and time.       ASSESSMENT & PLAN:    1. Palpitations She has not had significant palpitations since her last visit.  She finished her monitor but has not yet mailed it back.  We have given her instructions on how to mail the device.  We will contact her with the results after they are obtained.  2.  Chronic diastolic CHF (congestive heart failure) (HCC) Overall, her volume status appears stable.  Her legs are more swollen today but she has not yet taken her furosemide.  Continue current therapy.  3. Coronary artery disease involving native coronary artery of native heart without angina pectoris History of drug-eluting stent to the LAD in 2017.  Overall, she is doing well without anginal symptoms.  Recent LDL optimal.  Continue current dose of aspirin, atorvastatin.  4. Stage 3a chronic kidney disease Recent creatinine stable.  5. Essential hypertension Blood pressure above goal.  She notes her pressure usually increases in the office as she becomes nervous.  I have asked her to monitor her blood pressure at home and notify us if her pressure is 140/90 or higher.    Dispo:  Return in about 6 months (around 07/19/2020) for Routine Follow Up w/ Dr. Meda Coffee, or PA/NP.   Medication Adjustments/Labs and Tests Ordered: Current medicines are reviewed at length with the patient today.  Concerns regarding medicines are outlined above.  Tests Ordered: Orders Placed This Encounter  Procedures  . EKG 12-Lead   Medication Changes: No orders of the defined types were placed in this encounter.   Signed, Richardson Dopp, PA-C  01/17/2020 10:04 AM    [facility] [address], [place], [place]  [address] Phone: [phone]; Fax: [phone]

## 2020-01-17 ENCOUNTER — Encounter: Payer: Self-pay | Admitting: Physician Assistant

## 2020-01-17 ENCOUNTER — Other Ambulatory Visit: Payer: Self-pay

## 2020-01-17 ENCOUNTER — Ambulatory Visit (INDEPENDENT_AMBULATORY_CARE_PROVIDER_SITE_OTHER): Payer: Medicare Other | Admitting: Physician Assistant

## 2020-01-17 VITALS — BP 144/92 | HR 78 | Ht 62.8 in | Wt 305.4 lb

## 2020-01-17 DIAGNOSIS — I10 Essential (primary) hypertension: Secondary | ICD-10-CM | POA: Diagnosis not present

## 2020-01-17 DIAGNOSIS — R002 Palpitations: Secondary | ICD-10-CM

## 2020-01-17 DIAGNOSIS — I2510 Atherosclerotic heart disease of native coronary artery without angina pectoris: Secondary | ICD-10-CM | POA: Diagnosis not present

## 2020-01-17 DIAGNOSIS — N1831 Chronic kidney disease, stage 3a: Secondary | ICD-10-CM | POA: Diagnosis not present

## 2020-01-17 DIAGNOSIS — I5032 Chronic diastolic (congestive) heart failure: Secondary | ICD-10-CM | POA: Diagnosis not present

## 2020-01-17 NOTE — Patient Instructions (Signed)
Medication Instructions:  Your physician recommends that you continue on your current medications as directed. Please refer to the Current Medication list given to you today.  *If you need a refill on your cardiac medications before your next appointment, please call your pharmacy*   Lab Work: None ordered  If you have labs (blood work) drawn today and your tests are completely normal, you will receive your results only by: . MyChart Message (if you have MyChart) OR . A paper copy in the mail If you have any lab test that is abnormal or we need to change your treatment, we will call you to review the results.   Testing/Procedures: None ordered   Follow-Up: At [facility], you and your health needs are our priority.  As part of our continuing mission to provide you with exceptional heart care, we have created designated Provider Care Teams.  These Care Teams include your primary Cardiologist (physician) and Advanced Practice Providers (APPs -  Physician Assistants and Nurse Practitioners) who all work together to provide you with the care you need, when you need it.  We recommend signing up for the patient portal called "MyChart".  Sign up information is provided on this After Visit Summary.  MyChart is used to connect with patients for Virtual Visits (Telemedicine).  Patients are able to view lab/test results, encounter notes, upcoming appointments, etc.  Non-urgent messages can be sent to your provider as well.   To learn more about what you can do with MyChart, go to [email or web address].com.    Your next appointment:   6 month(s)  The format for your next appointment:   In Person  Provider:   You may see Katarina Nelson, MD or one of the following Advanced Practice Providers on your designated Care Team:    Scott Weaver, PA-C  Vin Bhagat, PA-C    Other Instructions   

## 2020-02-06 ENCOUNTER — Telehealth: Payer: Self-pay

## 2020-02-06 NOTE — Telephone Encounter (Signed)
If she has been taking it all along, ok to continue and refill. Tereso Newcomer, PA-C    02/06/2020 5:22 PM

## 2020-02-06 NOTE — Telephone Encounter (Signed)
Patient was on Bystolic 2.5 mg once a day. During telehealth visit on 12/26/19, she did tell me she was not taking this, I removed from her list. Now she is saying that she is taking it. Does patient need to be on this medication?

## 2020-02-06 NOTE — Telephone Encounter (Signed)
Pt calling requesting a refill on Bystolic. This medication was D/C off of pt's medication list on 12/26/19, pt's preference. Pt stated that she did not say that she was not taking this medication and would like for this medication to be sent to your pharmacy as requested. Please address

## 2020-02-07 MED ORDER — NEBIVOLOL HCL 2.5 MG PO TABS
2.5000 mg | ORAL_TABLET | Freq: Every day | ORAL | 6 refills | Status: DC
Start: 2020-02-07 — End: 2020-08-06

## 2020-02-07 NOTE — Telephone Encounter (Signed)
RX sent

## 2020-02-16 ENCOUNTER — Other Ambulatory Visit: Payer: Self-pay

## 2020-02-16 ENCOUNTER — Ambulatory Visit (INDEPENDENT_AMBULATORY_CARE_PROVIDER_SITE_OTHER): Payer: Medicare Other | Admitting: Internal Medicine

## 2020-02-16 ENCOUNTER — Encounter: Payer: Self-pay | Admitting: Internal Medicine

## 2020-02-16 VITALS — BP 136/80 | HR 96 | Temp 98.1°F | Ht 62.8 in | Wt 304.6 lb

## 2020-02-16 DIAGNOSIS — I10 Essential (primary) hypertension: Secondary | ICD-10-CM | POA: Diagnosis not present

## 2020-02-16 DIAGNOSIS — I2510 Atherosclerotic heart disease of native coronary artery without angina pectoris: Secondary | ICD-10-CM

## 2020-02-16 DIAGNOSIS — R7309 Other abnormal glucose: Secondary | ICD-10-CM | POA: Diagnosis not present

## 2020-02-16 DIAGNOSIS — N1831 Chronic kidney disease, stage 3a: Secondary | ICD-10-CM | POA: Diagnosis not present

## 2020-02-16 DIAGNOSIS — E782 Mixed hyperlipidemia: Secondary | ICD-10-CM | POA: Diagnosis not present

## 2020-02-16 MED ORDER — BD PEN NEEDLE NANO U/F 32G X 4 MM MISC: 3 refills | Status: DC

## 2020-02-16 NOTE — Progress Notes (Signed)
This visit occurred during the SARS-CoV-2 public health emergency.  Safety protocols were in place, including screening questions prior to the visit, additional usage of staff PPE, and extensive cleaning of exam room while observing appropriate contact time as indicated for disinfecting solutions.  Subjective:     Patient ID: Sarah Weaver , female    DOB: 1936-06-13 , 84 y.o.   MRN: [id number]   Chief Complaint  Patient presents with  . Diabetes  . Hypertension    HPI She is here for Fu DM. Her fasting glucose has been 80-70 , denies symptoms of hypoglycemia. Linzess is helping her well with prevention of constipation.   She is currently on 0.5 mg of Ozempic weekly.   Recent Vital Signs   BP (!) 146/88 (BP Location: Left Arm, Patient Position: Sitting, Cuff Size: Normal)   Pulse 96   Temp 98.1 F (36.7 C) (Oral)   Ht 5' 2.8" (1.595 m)   Wt (!) 304 lb 9.6 oz (138.2 kg)   BMI 54.30 kg/m    Past Medical History:  Diagnosis Date  . Arthritis    "all over"  . Chronic diastolic CHF (congestive heart failure) (HCC)    Echocardiogram 12/2019: EF 60-65, no RWMA, mild LVH, Gr 1 DD, trivial MR, mild AoV sclerosis, no AS  . Chronic lower back pain   . CKD (chronic kidney disease), stage III   . Coronary artery disease    a. status post DES to the LAD 12/2015.  . Glaucoma   . Glaucoma of both eyes   . Hyperkalemia   . Hyperlipidemia   . Hypertension   . Type II diabetes mellitus ([place])        Jayquan Bradsher RODRIGUEZ-SOUTHWORTH 02/16/2020, 2:28 PM ozempic per week  Past Medical History:  Diagnosis Date  . Arthritis    "all over"  . Chronic diastolic CHF (congestive heart failure) (HCC)    Echocardiogram 12/2019: EF 60-65, no RWMA, mild LVH, Gr 1 DD, trivial MR, mild AoV sclerosis, no AS  . Chronic lower back pain   . CKD (chronic kidney disease), stage III   . Coronary artery disease    a. status post DES to the LAD 12/2015.  . Glaucoma   . Glaucoma of both eyes   .  Hyperkalemia   . Hyperlipidemia   . Hypertension   . Type II diabetes mellitus (HCC)      Family History  Problem Relation Age of Onset  . Heart disease Mother   . Heart attack Mother   . Hypertension Mother   . Heart disease Sister   . Cancer Sister   . Stroke Sister      Current Outpatient Medications:  .  amLODipine (NORVASC) 10 MG tablet, TAKE 1 TABLET BY MOUTH EVERY DAY, Disp: 90 tablet, Rfl: 0 .  aspirin EC 81 MG tablet, Take 81 mg by mouth daily., Disp: , Rfl:  .  atorvastatin (LIPITOR) 80 MG tablet, TAKE 1 TABLET BY MOUTH DAILY AT 6PM, Disp: 90 tablet, Rfl: 1 .  blood glucose meter kit and supplies KIT, Dispense based on patient and insurance preference. Use up to four times daily as directed. (FOR ICD-9 250.00, 250.01)., Disp: 1 each, Rfl: 0 .  brinzolamide (AZOPT) 1 % ophthalmic suspension, Place 1 drop into both eyes 3 (three) times daily. , Disp: , Rfl:  .  Cholecalciferol (NATURAL VITAMIN D-3) 5000 units TABS, Take 1 tablet by mouth daily., Disp: , Rfl:  .  ezetimibe (ZETIA) 10 MG  tablet, Take 1 tablet (10 mg total) by mouth daily., Disp: 90 tablet, Rfl: 3 .  furosemide (LASIX) 40 MG tablet, TAKE 1 TABLET BY MOUTH EVERY DAY, Disp: 90 tablet, Rfl: 1 .  insulin degludec (TRESIBA FLEXTOUCH) 100 UNIT/ML SOPN FlexTouch Pen, Inject 0.1 mLs (10 Units total) into the skin daily., Disp: 5 pen, Rfl: 3 .  Latanoprostene Bunod (VYZULTA) 0.024 % SOLN, Apply 1 drop to eye daily., Disp: , Rfl:  .  levothyroxine (SYNTHROID) 50 MCG tablet, TAKE 1 TABLET BY MOUTH EVERY DAY BEFORE BREAKFAST, Disp: 90 tablet, Rfl: 1 .  LINZESS 145 MCG CAPS capsule, TAKE 1 CAPSULE (145 MCG TOTAL) BY MOUTH DAILY BEFORE BREAKFAST. FOR CONSTIPATION, Disp: 90 capsule, Rfl: 0 .  losartan (COZAAR) 25 MG tablet, TAKE 1 TABLET BY MOUTH EVERY DAY, Disp: 90 tablet, Rfl: 2 .  nebivolol (BYSTOLIC) 2.5 MG tablet, Take 1 tablet (2.5 mg total) by mouth daily., Disp: 30 tablet, Rfl: 6 .  ONETOUCH VERIO test strip, CHECK  BLOOD SUGARS ONCE DAILY, Disp: 50 strip, Rfl: 11 .  OZEMPIC, 0.25 OR 0.5 MG/DOSE, 2 MG/1.5ML SOPN, INJECT 0.25 MG INTO THE SKIN ONCE A WEEK., Disp: 1.5 mL, Rfl: 1 .  traMADol-acetaminophen (ULTRACET) 37.5-325 MG tablet, Take 1 tablet by mouth every 6 (six) hours as needed for moderate pain (prn back pain)., Disp: 30 tablet, Rfl: 0 .  Insulin Pen Needle (BD PEN NEEDLE NANO U/F) 32G X 4 MM MISC, USE NIGHTLY AS DIRECTED, Disp: 100 each, Rfl: 3   Allergies  Allergen Reactions  . Carvedilol Cough    Pt reports causes her to cough  . Lisinopril Cough    REPORTS CAUSES DRY COUGH     Review of Systems  Review of Systems  Constitutional: Negative for diaphoresis and unexpected weight change.  HENT: Negative for tinnitus.   Eyes: Negative for visual disturbance.  Respiratory: Negative for chest tightness and shortness of breath.   Cardiovascular: Negative for chest pain, palpitations and leg swelling.  Gastrointestinal: Negative for constipation, diarrhea and nausea.  Endocrine: Negative for polydipsia, polyphagia and polyuria.  Genitourinary: Negative for dysuria and frequency.  Skin: Negative for rash and wound.  Neurological: Negative for dizziness, speech difficulty, weakness, numbness and headaches.   Today's Vitals   02/16/20 1011  BP: (!) 146/88  Pulse: 96  Temp: 98.1 F (36.7 C)  TempSrc: Oral  Weight: (!) 304 lb 9.6 oz (138.2 kg)  Height: 5' 2.8" (1.595 m)  PainSc: 0-No pain   Body mass index is 54.3 kg/m.   Objective:  Physical Exam  Constitutional: She is oriented to person, place, and time. She appears well-developed and well-nourished. No distress.  HENT:  Head: Normocephalic and atraumatic.  Right Ear: External ear normal.  Left Ear: External ear normal.  Nose: Nose normal.  Eyes: Conjunctivae are normal. Right eye exhibits no discharge. Left eye exhibits no discharge. No scleral icterus.  Neck: Neck supple. No thyromegaly present.  No carotid bruits bilaterally   Cardiovascular: Normal rate and regular rhythm.  No murmur heard. Pulmonary/Chest: Effort normal and breath sounds normal. No respiratory distress.  Musculoskeletal: Normal range of motion. She exhibits +1/4 pitting edema Lymphadenopathy:    She has no cervical adenopathy.  Neurological: She is alert and oriented to person, place, and time.  Skin: Skin is warm and dry. Capillary refill takes less than 2 seconds. No rash noted. She is not diaphoretic.  Psychiatric: She has a normal mood and affect. Her behavior is normal. Judgment and thought  content normal.  Nursing note reviewed.     Assessment And Plan:    1. Mixed hyperlipidemia- chronic. May continue current med - Hemoglobin A1c - Lipid Profile  2. Essential hypertension- stable. May continue current med.  - CMP14 + Anion Gap - CBC no Diff  3. Coronary artery disease involving native coronary artery of native heart without angina pectoris-stable. No action.   4. Class 3 severe obesity due to excess calories with serious comorbidity in adult, unspecified BMI (HCC)-chronic. Needs to continue walking and keeping her portions small.   5. Stage 3a chronic kidney disease- stable. CMP ordered.   We will inform her when her results are back. Fu with [name] in 3 months.     Ricci Dirocco RODRIGUEZ-SOUTHWORTH, PA-C    THE PATIENT IS ENCOURAGED TO PRACTICE SOCIAL DISTANCING DUE TO THE COVID-19 PANDEMIC.

## 2020-02-17 LAB — CBC
Hematocrit: 36.5 % (ref 34.0–46.6)
Hemoglobin: 12.1 g/dL (ref 11.1–15.9)
MCH: 28.5 pg (ref 26.6–33.0)
MCHC: 33.2 g/dL (ref 31.5–35.7)
MCV: 86 fL (ref 79–97)
Platelets: 211 10*3/uL (ref 150–450)
RBC: 4.24 x10E6/uL (ref 3.77–5.28)
RDW: 13 % (ref 11.7–15.4)
WBC: 3.7 10*3/uL (ref 3.4–10.8)

## 2020-02-17 LAB — CMP14 + ANION GAP
ALT: 21 IU/L (ref 0–32)
AST: 20 IU/L (ref 0–40)
Albumin/Globulin Ratio: 1.5 (ref 1.2–2.2)
Albumin: 4.1 g/dL (ref 3.6–4.6)
Alkaline Phosphatase: 93 IU/L (ref 48–121)
Anion Gap: 14 mmol/L (ref 10.0–18.0)
BUN/Creatinine Ratio: 13 (ref 12–28)
BUN: 17 mg/dL (ref 8–27)
Bilirubin Total: 0.4 mg/dL (ref 0.0–1.2)
CO2: 25 mmol/L (ref 20–29)
Calcium: 9.4 mg/dL (ref 8.7–10.3)
Chloride: 102 mmol/L (ref 96–106)
Creatinine, Ser: 1.32 mg/dL — ABNORMAL HIGH (ref 0.57–1.00)
GFR calc Af Amer: 43 mL/min/{1.73_m2} — ABNORMAL LOW (ref >59)
GFR calc non Af Amer: 38 mL/min/{1.73_m2} — ABNORMAL LOW (ref >59)
Globulin, Total: 2.7 g/dL (ref 1.5–4.5)
Glucose: 133 mg/dL — ABNORMAL HIGH (ref 65–99)
Potassium: 4.4 mmol/L (ref 3.5–5.2)
Sodium: 141 mmol/L (ref 134–144)
Total Protein: 6.8 g/dL (ref 6.0–8.5)

## 2020-02-17 LAB — LIPID PANEL
Chol/HDL Ratio: 2.6 ratio (ref 0.0–4.4)
Cholesterol, Total: 129 mg/dL (ref 100–199)
HDL: 50 mg/dL (ref >39)
LDL Chol Calc (NIH): 65 mg/dL (ref 0–99)
Triglycerides: 67 mg/dL (ref 0–149)
VLDL Cholesterol Cal: 14 mg/dL (ref 5–40)

## 2020-02-17 LAB — HEMOGLOBIN A1C
Est. average glucose Bld gHb Est-mCnc: 171 mg/dL
Hgb A1c MFr Bld: 7.6 % — ABNORMAL HIGH (ref 4.8–5.6)

## 2020-02-19 ENCOUNTER — Other Ambulatory Visit: Payer: Self-pay | Admitting: Internal Medicine

## 2020-02-20 ENCOUNTER — Other Ambulatory Visit: Payer: Self-pay | Admitting: Cardiology

## 2020-03-01 ENCOUNTER — Ambulatory Visit: Payer: Medicare Other | Admitting: Internal Medicine

## 2020-03-06 ENCOUNTER — Other Ambulatory Visit: Payer: Self-pay | Admitting: Internal Medicine

## 2020-03-06 ENCOUNTER — Other Ambulatory Visit: Payer: Self-pay | Admitting: Cardiology

## 2020-03-07 ENCOUNTER — Ambulatory Visit: Payer: Medicare Other | Admitting: Nurse Practitioner

## 2020-03-08 ENCOUNTER — Ambulatory Visit: Payer: Medicare Other | Admitting: Internal Medicine

## 2020-03-15 DIAGNOSIS — H401131 Primary open-angle glaucoma, bilateral, mild stage: Secondary | ICD-10-CM | POA: Diagnosis not present

## 2020-03-19 ENCOUNTER — Other Ambulatory Visit: Payer: Self-pay | Admitting: Nurse Practitioner

## 2020-04-19 ENCOUNTER — Telehealth: Payer: Self-pay | Admitting: Cardiology

## 2020-04-19 NOTE — Telephone Encounter (Signed)
Pt c/o of Chest Pain: STAT if CP now or developed within 24 hours  1. Are you having CP right now? yes  2. Are you experiencing any other symptoms (ex. SOB, nausea, vomiting, sweating)? SOB   3. How long have you been experiencing CP? Since the weekend  4. Is your CP continuous or coming and going? continuous  5. Have you taken Nitroglycerin? No   Patient states her chest tightness started over the weekend. She states she has some SOB when moving around. She states her BP starting Tuesday to today was 138/72 HR 63, 167/77 HR 64, 143/73 HR 60.   ?

## 2020-04-19 NOTE — Telephone Encounter (Signed)
Pt has chest tightness and SOB  "not real tight but I know its there". Started over the weekend, heart is beating a little different.  Especially with walking room to room.  Irregular.  Not fast. No cough. Swelling -little bit in feet L>R.  Left was sprained recently.    Otherwise she is feeling ok. Seen by Wende Mott 3 months ago.  Was having palpitations.  Wore monitor.  Rare PVCs, PACs. I added her to Dr. Harvie Bridge schedule on 04/23/20.  She is grateful for this appointment.

## 2020-04-22 ENCOUNTER — Encounter: Payer: Self-pay | Admitting: Cardiovascular Disease

## 2020-04-22 NOTE — Progress Notes (Signed)
Cardiology Office Note:    Date:  04/23/2020   ID:  Sarah Weaver, DOB 12-02-1935, MRN [id number]  PCP:  [name], [facility] Cardiologist:  Ena Dawley, MD  [place] Electrophysiologist:  None   Referring MD: Carol Ada*   Chief Complaint  Patient presents with  . Chest Pain  . Hypertension     Aug. 9, 2021   Sarah Weaver is a 84 y.o. female with a hx of coronary artery disease, status post DES to the LAD in April, 2017, hypertension, type 2 diabetes mellitus, hyperlipidemia, morbid obesity, chronic kidney disease stage III, chronic diastolic congestive heart failure.  We are asked to see her today for a work in visit because of further episodes of chest pain and shortness of breath.  She has chronic leg edema, especially if she is late taking her Lasix.  She has a history of morbid obesity.  Her weight is 305 pounds at her last office visit.  Wt is 303 lbs today .  She has essential hypertension.  Last week , she had some tightness in her chest . Not similar to her previous episodes of chest tightness prior to her stenting  Not related to eating or drinking Tightness will last off and on for several days  The tightness is related to position.  Will have the tightness when she lies on one side and then it will go away when she turns to the other side.   Walks with a waker due to back injury .  Has occasional palpitations ( hard heart beats - may last for 1-2 seconds)   She has a constant soreness - has been present for days  Also has DOE with walking .   This DOE does not correspond to her episodes of chest tighness. I have personally reviewed the cath angio.   The last image is of the stent being positioned - I do not see any post stent images.    Past Medical History:  Diagnosis Date  . Arthritis    "all over"  . Chronic diastolic CHF (congestive heart failure) (HCC)    Echocardiogram 12/2019: EF 60-65, no  RWMA, mild LVH, Gr 1 DD, trivial MR, mild AoV sclerosis, no AS  . Chronic lower back pain   . CKD (chronic kidney disease), stage III   . Coronary artery disease    a. status post DES to the LAD 12/2015.  . Glaucoma   . Glaucoma of both eyes   . Hyperkalemia   . Hyperlipidemia   . Hypertension   . Type II diabetes mellitus ([place])     Past Surgical History:  Procedure Laterality Date  . CARDIAC CATHETERIZATION N/A 12/20/2015   Procedure: Left Heart Cath and Coronary Angiography;  Surgeon: Lorretta Harp, MD;  Location: [facility];  Service: Cardiovascular;  Laterality: N/A;  . CARDIAC CATHETERIZATION N/A 12/20/2015   Procedure: Coronary Stent Intervention;  Surgeon: Lorretta Harp, MD;  Location: [facility];  Service: Cardiovascular;  Laterality: N/A;  mid LAD 2.5 x 20 Synergy  . CATARACT EXTRACTION W/ INTRAOCULAR LENS  IMPLANT, BILATERAL Bilateral   . CORONARY ANGIOPLASTY WITH STENT PLACEMENT  12/20/2015  . LAPAROSCOPIC CHOLECYSTECTOMY  04/23/11  . TONSILLECTOMY      Current Medications: Current Meds  Medication Sig  . amLODipine (NORVASC) 10 MG tablet TAKE 1 TABLET BY MOUTH EVERY DAY  . aspirin EC 81 MG tablet Take 81 mg by mouth daily.  Marland Kitchen  atorvastatin (LIPITOR) 80 MG tablet TAKE 1 TABLET BY MOUTH DAILY AT 6PM  . blood glucose meter kit and supplies KIT Dispense based on patient and insurance preference. Use up to four times daily as directed. (FOR ICD-9 250.00, 250.01).  Marland Kitchen brinzolamide (AZOPT) 1 % ophthalmic suspension Place 1 drop into both eyes 3 (three) times daily.   . Cholecalciferol (NATURAL VITAMIN D-3) 5000 units TABS Take 1 tablet by mouth daily.  Marland Kitchen ezetimibe (ZETIA) 10 MG tablet Take 1 tablet (10 mg total) by mouth daily.  . furosemide (LASIX) 40 MG tablet TAKE 1 TABLET BY MOUTH EVERY DAY  . insulin degludec (TRESIBA FLEXTOUCH) 100 UNIT/ML SOPN FlexTouch Pen Inject 0.1 mLs (10 Units total) into the skin daily.  . Insulin Pen Needle (BD PEN NEEDLE NANO U/F)  32G X 4 MM MISC USE NIGHTLY AS DIRECTED  . Latanoprostene Bunod (VYZULTA) 0.024 % SOLN Apply 1 drop to eye daily.  Marland Kitchen levothyroxine (SYNTHROID) 50 MCG tablet TAKE 1 TABLET BY MOUTH EVERY DAY BEFORE BREAKFAST  . LINZESS 145 MCG CAPS capsule TAKE 1 CAPSULE (145 MCG TOTAL) BY MOUTH DAILY BEFORE BREAKFAST. FOR CONSTIPATION  . losartan (COZAAR) 25 MG tablet TAKE 1 TABLET BY MOUTH EVERY DAY  . nebivolol (BYSTOLIC) 2.5 MG tablet Take 1 tablet (2.5 mg total) by mouth daily.  Glory Rosebush VERIO test strip CHECK BLOOD SUGARS ONCE DAILY  . OZEMPIC, 0.25 OR 0.5 MG/DOSE, 2 MG/1.5ML SOPN INJECT 0.25 MG INTO THE SKIN ONCE A WEEK.  . traMADol-acetaminophen (ULTRACET) 37.5-325 MG tablet Take 1 tablet by mouth every 6 (six) hours as needed for moderate pain (prn back pain).     Allergies:   Carvedilol and Lisinopril   Social History   Socioeconomic History  . Marital status: Divorced    Spouse name: Not on file  . Number of children: Not on file  . Years of education: Not on file  . Highest education level: Not on file  Occupational History  . Occupation: retired  Tobacco Use  . Smoking status: Never Smoker  . Smokeless tobacco: Never Used  Vaping Use  . Vaping Use: Never used  Substance and Sexual Activity  . Alcohol use: No  . Drug use: No  . Sexual activity: Not Currently  Other Topics Concern  . Not on file  Social History Narrative  . Not on file   Social Determinants of Health   Financial Resource Strain:   . Difficulty of Paying Living Expenses:   Food Insecurity:   . Worried About Charity fundraiser in the Last Year:   . Arboriculturist in the Last Year:   Transportation Needs:   . Film/video editor (Medical):   Marland Kitchen Lack of Transportation (Non-Medical):   Physical Activity:   . Days of Exercise per Week:   . Minutes of Exercise per Session:   Stress:   . Feeling of Stress :   Social Connections:   . Frequency of Communication with Friends and Family:   . Frequency of  Social Gatherings with Friends and Family:   . Attends Religious Services:   . Active Member of Clubs or Organizations:   . Attends Archivist Meetings:   Marland Kitchen Marital Status:      Family History: The patient's family history includes Cancer in her sister; Heart attack in her mother; Heart disease in her mother and sister; Hypertension in her mother; Stroke in her sister.  ROS:   Please see the history of present  illness.     All other systems reviewed and are negative.  EKGs/Labs/Other Studies Reviewed:    The following studies were reviewed today:   EKG:  Aug. 9 2021  NSR at 77.  No ST or T   Recent Labs: 12/27/2019: Magnesium 1.8; NT-Pro BNP 107; TSH 2.660 02/16/2020: ALT 21; BUN 17; Creatinine, Ser 1.32; Hemoglobin 12.1; Platelets 211; Potassium 4.4; Sodium 141  Recent Lipid Panel    Component Value Date/Time   CHOL 129 02/16/2020 1119   TRIG 67 02/16/2020 1119   HDL 50 02/16/2020 1119   CHOLHDL 2.6 02/16/2020 1119   CHOLHDL 3.3 12/21/2015 0400   VLDL 15 12/21/2015 0400   LDLCALC 65 02/16/2020 1119    Physical Exam:    VS:  BP 134/68   Pulse 77   Ht 5' 4"  (1.626 m)   Wt (!) 303 lb (137.4 kg)   SpO2 97%   BMI 52.01 kg/m     Wt Readings from Last 3 Encounters:  04/23/20 (!) 303 lb (137.4 kg)  02/16/20 (!) 304 lb 9.6 oz (138.2 kg)  01/17/20 (!) 305 lb 6.4 oz (138.5 kg)     GEN:   Elderly , morbidly obese female.   Body mass index is 52.01 kg/m.  HEENT: Normal NECK: No JVD; No carotid bruits LYMPHATICS: No lymphadenopathy CARDIAC: RRR, no murmurs, rubs, gallops , + chest wall tenderness.   RESPIRATORY:  Clear to auscultation without rales, wheezing or rhonchi  + chest wall tenderness.  ABDOMEN: Soft, non-tender, non-distended MUSCULOSKELETAL:  No edema; No deformity  SKIN: Warm and dry NEUROLOGIC:  Alert and oriented x 3 PSYCHIATRIC:  Normal affect   ASSESSMENT:    1. Chest pain of uncertain etiology   2. Coronary artery disease involving  native coronary artery of native heart without angina pectoris   3. Hyperlipidemia, unspecified hyperlipidemia type    PLAN:    In order of problems listed above:  1. Chest pain :  Her CP is very aytypical for angina.   Pains will worsen with lying on one side - relieved with turning over Pain is not similar to her previous angina  I think the etiology of her chest pain is musculoskeletal.    With palpation I am able to reproduce the chest pressure that she is talking about.   I also think that her weight is playing a part.  I encouraged her to try some Tylenol or or perhaps Motrin to help with her chest wall pain. I have encouraged weight loss.    Morbid obesity:  We have given her Mediterranean diet.  We discussed eliminating processed foods from her diet.  Ive  advised her to stay away from things that are white, wheat, sweet.  She will follow up with Dr. Meda Coffee approximately 6 months.  Medication Adjustments/Labs and Tests Ordered: Current medicines are reviewed at length with the patient today.  Concerns regarding medicines are outlined above.  Orders Placed This Encounter  Procedures  . EKG 12-Lead   No orders of the defined types were placed in this encounter.   Patient Instructions  Dave's killer bread - 2 thin slices a day  No [place] bacon or similar  No processed foods   Medication Instructions:  Your physician recommends that you continue on your current medications as directed. Please refer to the Current Medication list given to you today.  *If you need a refill on your cardiac medications before your next appointment, please call your pharmacy*   Lab  Work: None Ordered If you have labs (blood work) drawn today and your tests are completely normal, you will receive your results only by: Marland Kitchen MyChart Message (if you have MyChart) OR . A paper copy in the mail If you have any lab test that is abnormal or we need to change your treatment, we will call you to  review the results.   Testing/Procedures: None Ordered    Follow-Up: At [facility], you and your health needs are our priority.  As part of our continuing mission to provide you with exceptional heart care, we have created designated Provider Care Teams.  These Care Teams include your primary Cardiologist (physician) and Advanced Practice Providers (APPs -  Physician Assistants and Nurse Practitioners) who all work together to provide you with the care you need, when you need it.   Your next appointment:   6 month(s)  The format for your next appointment:   In Person  Provider:   Ena Dawley, MD   Other Instructions   Mediterranean Diet A Mediterranean diet refers to food and lifestyle choices that are based on the traditions of countries located on the [place]. This way of eating has been shown to help prevent certain conditions and improve outcomes for people who have chronic diseases, like kidney disease and heart disease. What are tips for following this plan? Lifestyle  Cook and eat meals together with your family, when possible.  Drink enough fluid to keep your urine clear or pale yellow.  Be physically active every day. This includes: ? Aerobic exercise like running or swimming. ? Leisure activities like gardening, walking, or housework.  Get 7-8 hours of sleep each night.  If recommended by your health care provider, drink red wine in moderation. This means 1 glass a day for nonpregnant women and 2 glasses a day for men. A glass of wine equals 5 oz (150 mL). Reading food labels   Check the serving size of packaged foods. For foods such as rice and pasta, the serving size refers to the amount of cooked product, not dry.  Check the total fat in packaged foods. Avoid foods that have saturated fat or trans fats.  Check the ingredients list for added sugars, such as corn syrup. Shopping  At the grocery store, buy most of your food from the areas  near the walls of the store. This includes: ? Fresh fruits and vegetables (produce). ? Grains, beans, nuts, and seeds. Some of these may be available in unpackaged forms or large amounts (in bulk). ? Fresh seafood. ? Poultry and eggs. ? Low-fat dairy products.  Buy whole ingredients instead of prepackaged foods.  Buy fresh fruits and vegetables in-season from local farmers markets.  Buy frozen fruits and vegetables in resealable bags.  If you do not have access to quality fresh seafood, buy precooked frozen shrimp or canned fish, such as tuna, salmon, or sardines.  Buy small amounts of raw or cooked vegetables, salads, or olives from the deli or salad bar at your store.  Stock your pantry so you always have certain foods on hand, such as olive oil, canned tuna, canned tomatoes, rice, pasta, and beans. Cooking  Cook foods with extra-virgin olive oil instead of using butter or other vegetable oils.  Have meat as a side dish, and have vegetables or grains as your main dish. This means having meat in small portions or adding small amounts of meat to foods like pasta or stew.  Use beans or vegetables instead  of meat in common dishes like chili or lasagna.  Experiment with different cooking methods. Try roasting or broiling vegetables instead of steaming or sauteing them.  Add frozen vegetables to soups, stews, pasta, or rice.  Add nuts or seeds for added healthy fat at each meal. You can add these to yogurt, salads, or vegetable dishes.  Marinate fish or vegetables using olive oil, lemon juice, garlic, and fresh herbs. Meal planning   Plan to eat 1 vegetarian meal one day each week. Try to work up to 2 vegetarian meals, if possible.  Eat seafood 2 or more times a week.  Have healthy snacks readily available, such as: ? Vegetable sticks with hummus. ? [place] yogurt. ? Fruit and nut trail mix.  Eat balanced meals throughout the week. This includes: ? Fruit: 2-3 servings a day  ? Vegetables: 4-5 servings a day ? Low-fat dairy: 2 servings a day ? Fish, poultry, or lean meat: 1 serving a day ? Beans and legumes: 2 or more servings a week ? Nuts and seeds: 1-2 servings a day ? Whole grains: 6-8 servings a day ? Extra-virgin olive oil: 3-4 servings a day  Limit red meat and sweets to only a few servings a month What are my food choices?  Mediterranean diet ? Recommended  Grains: Whole-grain pasta. Brown rice. Bulgar wheat. Polenta. Couscous. Whole-wheat bread. [place].  Vegetables: Artichokes. Beets. Broccoli. Cabbage. Carrots. Eggplant. Green beans. Chard. Kale. Spinach. Onions. Leeks. Peas. Squash. Tomatoes. Peppers. Radishes.  Fruits: Apples. Apricots. Avocado. Berries. Bananas. Cherries. Dates. Figs. Grapes. Lemons. Melon. Oranges. Peaches. Plums. Pomegranate.  Meats and other protein foods: Beans. Almonds. Sunflower seeds. Pine nuts. Peanuts. [place]. Salmon. Scallops. Shrimp. [place]. Tilapia. Clams. Oysters. Eggs.  Dairy: Low-fat milk. Cheese. Greek yogurt.  Beverages: Water. Red wine. Herbal tea.  Fats and oils: Extra virgin olive oil. Avocado oil. Grape seed oil.  Sweets and desserts: [place] yogurt with honey. Baked apples. Poached pears. Trail mix.  Seasoning and other foods: Basil. Cilantro. Coriander. Cumin. Mint. Parsley. Sage. Rosemary. Tarragon. Garlic. Oregano. Thyme. Pepper. Balsalmic vinegar. Tahini. Hummus. Tomato sauce. Olives. Mushrooms. ? Limit these  Grains: Prepackaged pasta or rice dishes. Prepackaged cereal with added sugar.  Vegetables: Deep fried potatoes (french fries).  Fruits: Fruit canned in syrup.  Meats and other protein foods: Beef. Pork. Lamb. Poultry with skin. Hot dogs. Berniece Salines.  Dairy: Ice cream. Sour cream. Whole milk.  Beverages: Juice. Sugar-sweetened soft drinks. Beer. Liquor and spirits.  Fats and oils: Butter. Canola oil. Vegetable oil. Beef fat (tallow). Lard.  Sweets and desserts: Cookies. Cakes. Pies.  Candy.  Seasoning and other foods: Mayonnaise. Premade sauces and marinades. The items listed may not be a complete list. Talk with your dietitian about what dietary choices are right for you. Summary  The Mediterranean diet includes both food and lifestyle choices.  Eat a variety of fresh fruits and vegetables, beans, nuts, seeds, and whole grains.  Limit the amount of red meat and sweets that you eat.  Talk with your health care provider about whether it is safe for you to drink red wine in moderation. This means 1 glass a day for nonpregnant women and 2 glasses a day for men. A glass of wine equals 5 oz (150 mL). This information is not intended to replace advice given to you by your health care provider. Make sure you discuss any questions you have with your health care provider. Document Revised: 05/01/2016 Document Reviewed: 04/24/2016 Elsevier Patient Education  2020 Elsevier  Inc.      Signed, Mertie Moores, MD  04/23/2020 5:13 PM    [place]

## 2020-04-23 ENCOUNTER — Ambulatory Visit (INDEPENDENT_AMBULATORY_CARE_PROVIDER_SITE_OTHER): Payer: Medicare Other | Admitting: Cardiovascular Disease

## 2020-04-23 ENCOUNTER — Other Ambulatory Visit: Payer: Self-pay

## 2020-04-23 ENCOUNTER — Encounter: Payer: Self-pay | Admitting: Cardiovascular Disease

## 2020-04-23 VITALS — BP 134/68 | HR 77 | Ht 64.0 in | Wt 303.0 lb

## 2020-04-23 DIAGNOSIS — R079 Chest pain, unspecified: Secondary | ICD-10-CM

## 2020-04-23 DIAGNOSIS — E785 Hyperlipidemia, unspecified: Secondary | ICD-10-CM | POA: Diagnosis not present

## 2020-04-23 DIAGNOSIS — I10 Essential (primary) hypertension: Secondary | ICD-10-CM | POA: Diagnosis not present

## 2020-04-23 DIAGNOSIS — I2510 Atherosclerotic heart disease of native coronary artery without angina pectoris: Secondary | ICD-10-CM

## 2020-04-23 NOTE — Patient Instructions (Addendum)
Dave's killer bread - 2 thin slices a day  No [place] bacon or similar  No processed foods   Medication Instructions:  Your physician recommends that you continue on your current medications as directed. Please refer to the Current Medication list given to you today.  *If you need a refill on your cardiac medications before your next appointment, please call your pharmacy*   Lab Work: None Ordered If you have labs (blood work) drawn today and your tests are completely normal, you will receive your results only by: Marland Kitchen MyChart Message (if you have MyChart) OR . A paper copy in the mail If you have any lab test that is abnormal or we need to change your treatment, we will call you to review the results.   Testing/Procedures: None Ordered    Follow-Up: At [facility], you and your health needs are our priority.  As part of our continuing mission to provide you with exceptional heart care, we have created designated Provider Care Teams.  These Care Teams include your primary Cardiologist (physician) and Advanced Practice Providers (APPs -  Physician Assistants and Nurse Practitioners) who all work together to provide you with the care you need, when you need it.   Your next appointment:   6 month(s)  The format for your next appointment:   In Person  Provider:   Tobias Alexander, MD   Other Instructions   Mediterranean Diet A Mediterranean diet refers to food and lifestyle choices that are based on the traditions of countries located on the [place]. This way of eating has been shown to help prevent certain conditions and improve outcomes for people who have chronic diseases, like kidney disease and heart disease. What are tips for following this plan? Lifestyle  Cook and eat meals together with your family, when possible.  Drink enough fluid to keep your urine clear or pale yellow.  Be physically active every day. This includes: ? Aerobic exercise like running  or swimming. ? Leisure activities like gardening, walking, or housework.  Get 7-8 hours of sleep each night.  If recommended by your health care provider, drink red wine in moderation. This means 1 glass a day for nonpregnant women and 2 glasses a day for men. A glass of wine equals 5 oz (150 mL). Reading food labels   Check the serving size of packaged foods. For foods such as rice and pasta, the serving size refers to the amount of cooked product, not dry.  Check the total fat in packaged foods. Avoid foods that have saturated fat or trans fats.  Check the ingredients list for added sugars, such as corn syrup. Shopping  At the grocery store, buy most of your food from the areas near the walls of the store. This includes: ? Fresh fruits and vegetables (produce). ? Grains, beans, nuts, and seeds. Some of these may be available in unpackaged forms or large amounts (in bulk). ? Fresh seafood. ? Poultry and eggs. ? Low-fat dairy products.  Buy whole ingredients instead of prepackaged foods.  Buy fresh fruits and vegetables in-season from local farmers markets.  Buy frozen fruits and vegetables in resealable bags.  If you do not have access to quality fresh seafood, buy precooked frozen shrimp or canned fish, such as tuna, salmon, or sardines.  Buy small amounts of raw or cooked vegetables, salads, or olives from the deli or salad bar at your store.  Stock your pantry so you always have certain foods on hand, such as  olive oil, canned tuna, canned tomatoes, rice, pasta, and beans. Cooking  Cook foods with extra-virgin olive oil instead of using butter or other vegetable oils.  Have meat as a side dish, and have vegetables or grains as your main dish. This means having meat in small portions or adding small amounts of meat to foods like pasta or stew.  Use beans or vegetables instead of meat in common dishes like chili or lasagna.  Experiment with different cooking methods. Try  roasting or broiling vegetables instead of steaming or sauteing them.  Add frozen vegetables to soups, stews, pasta, or rice.  Add nuts or seeds for added healthy fat at each meal. You can add these to yogurt, salads, or vegetable dishes.  Marinate fish or vegetables using olive oil, lemon juice, garlic, and fresh herbs. Meal planning   Plan to eat 1 vegetarian meal one day each week. Try to work up to 2 vegetarian meals, if possible.  Eat seafood 2 or more times a week.  Have healthy snacks readily available, such as: ? Vegetable sticks with hummus. ? [place] yogurt. ? Fruit and nut trail mix.  Eat balanced meals throughout the week. This includes: ? Fruit: 2-3 servings a day ? Vegetables: 4-5 servings a day ? Low-fat dairy: 2 servings a day ? Fish, poultry, or lean meat: 1 serving a day ? Beans and legumes: 2 or more servings a week ? Nuts and seeds: 1-2 servings a day ? Whole grains: 6-8 servings a day ? Extra-virgin olive oil: 3-4 servings a day  Limit red meat and sweets to only a few servings a month What are my food choices?  Mediterranean diet ? Recommended  Grains: Whole-grain pasta. Brown rice. Bulgar wheat. Polenta. Couscous. Whole-wheat bread. [place].  Vegetables: Artichokes. Beets. Broccoli. Cabbage. Carrots. Eggplant. Green beans. Chard. Kale. Spinach. Onions. Leeks. Peas. Squash. Tomatoes. Peppers. Radishes.  Fruits: Apples. Apricots. Avocado. Berries. Bananas. Cherries. Dates. Figs. Grapes. Lemons. Melon. Oranges. Peaches. Plums. Pomegranate.  Meats and other protein foods: Beans. Almonds. Sunflower seeds. Pine nuts. Peanuts. [place]. Salmon. Scallops. Shrimp. Tuna. Tilapia. Clams. Oysters. Eggs.  Dairy: Low-fat milk. Cheese. Greek yogurt.  Beverages: Water. Red wine. Herbal tea.  Fats and oils: Extra virgin olive oil. Avocado oil. Grape seed oil.  Sweets and desserts: [place] yogurt with honey. Baked apples. Poached pears. Trail mix.  Seasoning  and other foods: Basil. Cilantro. Coriander. Cumin. Mint. Parsley. Sage. Rosemary. Tarragon. Garlic. Oregano. Thyme. Pepper. Balsalmic vinegar. Tahini. Hummus. Tomato sauce. Olives. Mushrooms. ? Limit these  Grains: Prepackaged pasta or rice dishes. Prepackaged cereal with added sugar.  Vegetables: Deep fried potatoes (french fries).  Fruits: Fruit canned in syrup.  Meats and other protein foods: Beef. Pork. Lamb. Poultry with skin. Hot dogs. Tomasa Blase.  Dairy: Ice cream. Sour cream. Whole milk.  Beverages: Juice. Sugar-sweetened soft drinks. Beer. Liquor and spirits.  Fats and oils: Butter. Canola oil. Vegetable oil. Beef fat (tallow). Lard.  Sweets and desserts: Cookies. Cakes. Pies. Candy.  Seasoning and other foods: Mayonnaise. Premade sauces and marinades. The items listed may not be a complete list. Talk with your dietitian about what dietary choices are right for you. Summary  The Mediterranean diet includes both food and lifestyle choices.  Eat a variety of fresh fruits and vegetables, beans, nuts, seeds, and whole grains.  Limit the amount of red meat and sweets that you eat.  Talk with your health care provider about whether it is safe for you to drink red wine in  moderation. This means 1 glass a day for nonpregnant women and 2 glasses a day for men. A glass of wine equals 5 oz (150 mL). This information is not intended to replace advice given to you by your health care provider. Make sure you discuss any questions you have with your health care provider. Document Revised: 05/01/2016 Document Reviewed: 04/24/2016 Elsevier Patient Education  2020 [employer].

## 2020-05-04 ENCOUNTER — Telehealth: Payer: Self-pay | Admitting: Cardiovascular Disease

## 2020-05-04 ENCOUNTER — Emergency Department (HOSPITAL_COMMUNITY)
Admission: EM | Admit: 2020-05-04 | Discharge: 2020-05-04 | Disposition: A | Discharge status: Home / Self Care | Payer: Medicare Other | Attending: Emergency Medicine | Admitting: Emergency Medicine

## 2020-05-04 ENCOUNTER — Encounter (HOSPITAL_COMMUNITY): Payer: Self-pay | Admitting: Pediatrics

## 2020-05-04 ENCOUNTER — Telehealth: Payer: Self-pay

## 2020-05-04 ENCOUNTER — Other Ambulatory Visit: Payer: Self-pay

## 2020-05-04 ENCOUNTER — Emergency Department (HOSPITAL_COMMUNITY): Payer: Medicare Other

## 2020-05-04 DIAGNOSIS — Z20822 Contact with and (suspected) exposure to covid-19: Secondary | ICD-10-CM

## 2020-05-04 DIAGNOSIS — U071 COVID-19: Secondary | ICD-10-CM | POA: Diagnosis not present

## 2020-05-04 DIAGNOSIS — Z79899 Other long term (current) drug therapy: Secondary | ICD-10-CM | POA: Diagnosis not present

## 2020-05-04 DIAGNOSIS — I5032 Chronic diastolic (congestive) heart failure: Secondary | ICD-10-CM | POA: Diagnosis not present

## 2020-05-04 DIAGNOSIS — E1122 Type 2 diabetes mellitus with diabetic chronic kidney disease: Secondary | ICD-10-CM | POA: Diagnosis not present

## 2020-05-04 DIAGNOSIS — I2510 Atherosclerotic heart disease of native coronary artery without angina pectoris: Secondary | ICD-10-CM | POA: Diagnosis not present

## 2020-05-04 DIAGNOSIS — N183 Chronic kidney disease, stage 3 unspecified: Secondary | ICD-10-CM | POA: Diagnosis not present

## 2020-05-04 DIAGNOSIS — Z794 Long term (current) use of insulin: Secondary | ICD-10-CM | POA: Insufficient documentation

## 2020-05-04 DIAGNOSIS — Z7982 Long term (current) use of aspirin: Secondary | ICD-10-CM | POA: Insufficient documentation

## 2020-05-04 DIAGNOSIS — R05 Cough: Secondary | ICD-10-CM | POA: Diagnosis not present

## 2020-05-04 DIAGNOSIS — I130 Hypertensive heart and chronic kidney disease with heart failure and stage 1 through stage 4 chronic kidney disease, or unspecified chronic kidney disease: Secondary | ICD-10-CM | POA: Insufficient documentation

## 2020-05-04 DIAGNOSIS — R0602 Shortness of breath: Secondary | ICD-10-CM | POA: Diagnosis not present

## 2020-05-04 DIAGNOSIS — J9811 Atelectasis: Secondary | ICD-10-CM | POA: Diagnosis not present

## 2020-05-04 LAB — COMPREHENSIVE METABOLIC PANEL
ALT: 31 U/L (ref 0–44)
AST: 29 U/L (ref 15–41)
Albumin: 3.5 g/dL (ref 3.5–5.0)
Alkaline Phosphatase: 66 U/L (ref 38–126)
Anion gap: 13 (ref 5–15)
BUN: 15 mg/dL (ref 8–23)
CO2: 22 mmol/L (ref 22–32)
Calcium: 8.8 mg/dL — ABNORMAL LOW (ref 8.9–10.3)
Chloride: 103 mmol/L (ref 98–111)
Creatinine, Ser: 1.4 mg/dL — ABNORMAL HIGH (ref 0.44–1.00)
GFR calc Af Amer: 40 mL/min — ABNORMAL LOW (ref >60)
GFR calc non Af Amer: 35 mL/min — ABNORMAL LOW (ref >60)
Glucose, Bld: 164 mg/dL — ABNORMAL HIGH (ref 70–99)
Potassium: 3.9 mmol/L (ref 3.5–5.1)
Sodium: 138 mmol/L (ref 135–145)
Total Bilirubin: 0.8 mg/dL (ref 0.3–1.2)
Total Protein: 6.7 g/dL (ref 6.5–8.1)

## 2020-05-04 LAB — CBC WITH DIFFERENTIAL/PLATELET
Abs Immature Granulocytes: 0 10*3/uL (ref 0.00–0.07)
Basophils Absolute: 0 10*3/uL (ref 0.0–0.1)
Basophils Relative: 1 %
Eosinophils Absolute: 0 10*3/uL (ref 0.0–0.5)
Eosinophils Relative: 0 %
HCT: 36.8 % (ref 36.0–46.0)
Hemoglobin: 11.4 g/dL — ABNORMAL LOW (ref 12.0–15.0)
Immature Granulocytes: 0 %
Lymphocytes Relative: 40 %
Lymphs Abs: 1.1 10*3/uL (ref 0.7–4.0)
MCH: 28.1 pg (ref 26.0–34.0)
MCHC: 31 g/dL (ref 30.0–36.0)
MCV: 90.9 fL (ref 80.0–100.0)
Monocytes Absolute: 0.3 10*3/uL (ref 0.1–1.0)
Monocytes Relative: 10 %
Neutro Abs: 1.4 10*3/uL — ABNORMAL LOW (ref 1.7–7.7)
Neutrophils Relative %: 49 %
Platelets: 157 10*3/uL (ref 150–400)
RBC: 4.05 MIL/uL (ref 3.87–5.11)
RDW: 13.1 % (ref 11.5–15.5)
WBC: 2.8 10*3/uL — ABNORMAL LOW (ref 4.0–10.5)
nRBC: 0 % (ref 0.0–0.2)

## 2020-05-04 LAB — SARS CORONAVIRUS 2 BY RT PCR (HOSPITAL ORDER, PERFORMED IN ~~LOC~~ HOSPITAL LAB): SARS Coronavirus 2: POSITIVE — AB

## 2020-05-04 MED ORDER — BENZONATATE 100 MG PO CAPS: 100.0000 mg | ORAL_CAPSULE | Freq: Three times a day (TID) | ORAL | 0 refills | Status: AC

## 2020-05-04 MED ORDER — ALBUTEROL SULFATE HFA 108 (90 BASE) MCG/ACT IN AERS
2.0000 | INHALATION_SPRAY | Freq: Once | RESPIRATORY_TRACT | Status: AC
  Administered 2020-05-04: 2 via RESPIRATORY_TRACT
  Filled 2020-05-04: qty 6.7

## 2020-05-04 MED ORDER — ALBUTEROL SULFATE HFA 108 (90 BASE) MCG/ACT IN AERS: 1.0000 | INHALATION_SPRAY | Freq: Four times a day (QID) | RESPIRATORY_TRACT | 0 refills | Status: DC | PRN

## 2020-05-04 NOTE — ED Notes (Signed)
Reviewed D/C instructions w/ pt including follow-up and medications. Pt verbalized understanding. Pt A&Ox4, VSS and in no apparent distress at time of departure.

## 2020-05-04 NOTE — Telephone Encounter (Signed)
PT GRANDDAUGHTER NANCY CALLED THE ANSWERING SERVICE TO BE ADVISED HOW TO PROCEED WITH GRANDMOTHER HAVING SOB DUE TO COVID. CALLED THE PT AND LET HER KNOW THAT SHE SHOULD GO TO THE EMERGENCY ROOM TO BE EVALUATED. I ALSO CALLED NANCY TO MAKE HER AWARE.

## 2020-05-04 NOTE — Telephone Encounter (Signed)
Patient's granddaughter states the patient tested positive for Covid-19 and she has a wet cough, fatigue, and some SOB.  Pt c/o Shortness Of Breath: STAT if SOB developed within the last 24 hours or pt is noticeably SOB on the phone  1. Are you currently SOB (can you hear that pt is SOB on the phone)? No  2. How long have you been experiencing SOB? Per patient's granddaughter, patient is always SOB, but since testing positive yesterday, 05/03/20, she has been more SOB.  3. Are you SOB when sitting or when up moving around? Both   4. Are you currently experiencing any other symptoms? Wet cough, fatigue

## 2020-05-04 NOTE — Discharge Instructions (Addendum)
You were given a prescription for an albuterol inhaler. Use 2 puffs every 4-6 hours as needed for shortness of breath. You are also given cough medication. Please take as directed. You may rotate Tylenol and/or Motrin for fevers. Make sure you are staying well-hydrated.  I have reached out to the infusion clinic so that she can be set up for monoclonal antibody infusion which can help with the symptoms of Covid. They will reach out to you in regards to making an appointment.  You should purchase an oxygen monitor to monitor your oxygen levels. If you notice that they are below 90% at home then he should return to the emergency department immediately. Please call your regular doctor to set up an appointment for follow-up within the next 2 to 3 days for reassessment. If you have any new or worsening symptoms in the meantime he should return to the emergency department immediately.

## 2020-05-04 NOTE — ED Provider Notes (Addendum)
[facility] EMERGENCY DEPARTMENT Provider Note   CSN: [id number] Arrival date & time: 05/04/20  1225     History Chief Complaint  Patient presents with  . Cough    Sarah Weaver is a 84 y.o. female.  HPI   Pt is an 84 y/o female with a h/o arthritis, CHF, CKD, CAD, HLD, HTN, DM, who presents to the ED today for eval of cough, fatigue and shortness of breath that started about 5 days ago. Reports cough is productive of phlegm. Denies hemoptysis. She reports she only has SOB with exertion, not at rest. Denies chest pain or shortness of breath. Denies rhinorrhea, congestion, sore throat, diarrhea, constipation. She has not taken anything for her symptoms. Denies BLE pain. Has chronic BLE swelling that is unchanged.  States she recently around her son and daughter who have COVID. She has not been vaccinated. She states she did an at home COVID test that was positive.   Past Medical History:  Diagnosis Date  . Arthritis    "all over"  . Chronic diastolic CHF (congestive heart failure) (HCC)    Echocardiogram 12/2019: EF 60-65, no RWMA, mild LVH, Gr 1 DD, trivial MR, mild AoV sclerosis, no AS  . Chronic lower back pain   . CKD (chronic kidney disease), stage III   . Coronary artery disease    a. status post DES to the LAD 12/2015.  . Glaucoma   . Glaucoma of both eyes   . Hyperkalemia   . Hyperlipidemia   . Hypertension   . Type II diabetes mellitus [facility])     Patient Active Problem List   Diagnosis Date Noted  . Class 3 severe obesity due to excess calories with serious comorbidity in adult ([place]) 08/25/2019  . Diabetes mellitus due to underlying condition with stage 3a chronic kidney disease, with long-term current use of insulin ([place]) 06/16/2019  . CAD (coronary artery disease) 07/26/2018  . Chronic diastolic CHF (congestive heart failure) ([place]) 07/26/2018  . Chronic kidney disease (CKD), stage III (moderate) 07/26/2018  . Chest pain, moderate  coronary artery risk 12/20/2015  . DM2 (diabetes mellitus, type 2) ([place]) 12/20/2015  . HTN (hypertension) 12/20/2015  . HLD (hyperlipidemia) 12/20/2015  . Ischemic chest pain ([place]) 12/20/2015  . NSTEMI (non-ST elevated myocardial infarction) ([place])   . Hypertensive heart disease without heart failure   . Acute renal failure ([place])   . Cholelithiasis 04/14/2011    Past Surgical History:  Procedure Laterality Date  . CARDIAC CATHETERIZATION N/A 12/20/2015   Procedure: Left Heart Cath and Coronary Angiography;  Surgeon: Lorretta Harp, MD;  Location: [facility];  Service: Cardiovascular;  Laterality: N/A;  . CARDIAC CATHETERIZATION N/A 12/20/2015   Procedure: Coronary Stent Intervention;  Surgeon: Lorretta Harp, MD;  Location: [facility];  Service: Cardiovascular;  Laterality: N/A;  mid LAD 2.5 x 20 Synergy  . CATARACT EXTRACTION W/ INTRAOCULAR LENS  IMPLANT, BILATERAL Bilateral   . CORONARY ANGIOPLASTY WITH STENT PLACEMENT  12/20/2015  . LAPAROSCOPIC CHOLECYSTECTOMY  04/23/11  . TONSILLECTOMY       OB History   No obstetric history on file.     Family History  Problem Relation Age of Onset  . Heart disease Mother   . Heart attack Mother   . Hypertension Mother   . Heart disease Sister   . Cancer Sister   . Stroke Sister     Social History   Tobacco Use  . Smoking status:  Never Smoker  . Smokeless tobacco: Never Used  Vaping Use  . Vaping Use: Never used  Substance Use Topics  . Alcohol use: No  . Drug use: No    Home Medications Prior to Admission medications   Medication Sig Start Date End Date Taking? Authorizing Provider  amLODipine (NORVASC) 10 MG tablet TAKE 1 TABLET BY MOUTH EVERY DAY 03/06/20   [name], FNP  aspirin EC 81 MG tablet Take 81 mg by mouth daily.    [provider]  atorvastatin (LIPITOR) 80 MG tablet TAKE 1 TABLET BY MOUTH DAILY AT 6PM 02/21/20   Richardson Dopp T, PA-C  blood glucose meter kit and supplies KIT Dispense  based on patient and insurance preference. Use up to four times daily as directed. (FOR ICD-9 250.00, 250.01). 09/01/19   Rodriguez-Southworth, Sunday Spillers, PA-C  brinzolamide (AZOPT) 1 % ophthalmic suspension Place 1 drop into both eyes 3 (three) times daily.     [provider]  Cholecalciferol (NATURAL VITAMIN D-3) 5000 units TABS Take 1 tablet by mouth daily.    [provider]  ezetimibe (ZETIA) 10 MG tablet Take 1 tablet (10 mg total) by mouth daily. 12/26/19   Richardson Dopp T, PA-C  furosemide (LASIX) 40 MG tablet TAKE 1 TABLET BY MOUTH EVERY DAY 11/15/19   Dorothy Spark, MD  insulin degludec (TRESIBA FLEXTOUCH) 100 UNIT/ML SOPN FlexTouch Pen Inject 0.1 mLs (10 Units total) into the skin daily. 04/11/19   Rodriguez-Southworth, Sunday Spillers, PA-C  Insulin Pen Needle (BD PEN NEEDLE NANO U/F) 32G X 4 MM MISC USE NIGHTLY AS DIRECTED 02/16/20   Rodriguez-Southworth, Sunday Spillers, PA-C  Latanoprostene Bunod (VYZULTA) 0.024 % SOLN Apply 1 drop to eye daily.    [provider]  levothyroxine (SYNTHROID) 50 MCG tablet TAKE 1 TABLET BY MOUTH EVERY DAY BEFORE BREAKFAST 11/14/19   Rodriguez-Southworth, Sunday Spillers, PA-C  LINZESS 145 MCG CAPS capsule TAKE 1 CAPSULE (145 MCG TOTAL) BY MOUTH DAILY BEFORE BREAKFAST. FOR CONSTIPATION 02/20/20   [name], FNP  losartan (COZAAR) 25 MG tablet TAKE 1 TABLET BY MOUTH EVERY DAY 03/07/20   Dorothy Spark, MD  nebivolol (BYSTOLIC) 2.5 MG tablet Take 1 tablet (2.5 mg total) by mouth daily. 02/07/20   Richardson Dopp T, PA-C  ONETOUCH VERIO test strip CHECK BLOOD SUGARS ONCE DAILY 05/02/19   Rodriguez-Southworth, Sunday Spillers, PA-C  OZEMPIC, 0.25 OR 0.5 MG/DOSE, 2 MG/1.5ML SOPN INJECT 0.25 MG INTO THE SKIN ONCE A WEEK. 03/19/20   [name], FNP  traMADol-acetaminophen (ULTRACET) 37.5-325 MG tablet Take 1 tablet by mouth every 6 (six) hours as needed for moderate pain (prn back pain). 12/01/19   Rodriguez-Southworth, Sunday Spillers, PA-C  albuterol (VENTOLIN HFA) 108 (90 Base)  MCG/ACT inhaler Inhale 1-2 puffs into the lungs every 6 (six) hours as needed for wheezing or shortness of breath. 05/04/20 05/04/20  Nahun Kronberg S, PA-C    Allergies    Carvedilol and Lisinopril  Review of Systems   Review of Systems  Constitutional: Negative for fever.  HENT: Negative for ear pain and sore throat.   Eyes: Negative for pain and visual disturbance.  Respiratory: Positive for cough and shortness of breath.   Cardiovascular: Negative for chest pain.  Gastrointestinal: Negative for abdominal pain, constipation, diarrhea, nausea and vomiting.  Genitourinary: Negative for dysuria and hematuria.  Musculoskeletal: Negative for back pain.  Skin: Negative for rash.  Neurological: Negative for headaches.  All other systems reviewed and are negative.   Physical Exam Updated Vital Signs BP (!) 165/70  Pulse 70   Temp 98.6 F (37 C) (Oral)   Resp (!) 22   Ht _0  (1.626 m)   Wt (!) 137.4 kg   SpO2 97%   BMI 52.01 kg/m   Physical Exam Vitals and nursing note reviewed.  Constitutional:      General: She is not in acute distress.    Appearance: She is well-developed.  HENT:     Head: Normocephalic and atraumatic.  Eyes:     Conjunctiva/sclera: Conjunctivae normal.  Cardiovascular:     Rate and Rhythm: Normal rate and regular rhythm.     Heart sounds: No murmur heard.   Pulmonary:     Effort: Pulmonary effort is normal.     Comments: Decreased breath sounds throughout Abdominal:     General: Bowel sounds are normal.     Palpations: Abdomen is soft.     Tenderness: There is no abdominal tenderness. There is no guarding or rebound.  Musculoskeletal:     Cervical back: Neck supple.     Comments: 2+ pitting edema to the BLE (pt states chronic)  Skin:    General: Skin is warm and dry.  Neurological:     Mental Status: She is alert.     ED Results / Procedures / Treatments   Labs (all labs ordered are listed, but only abnormal results are  displayed) Labs Reviewed  CBC WITH DIFFERENTIAL/PLATELET - Abnormal; Notable for the following components:      Result Value   WBC 2.8 (*)    Hemoglobin 11.4 (*)    Neutro Abs 1.4 (*)    All other components within normal limits  COMPREHENSIVE METABOLIC PANEL - Abnormal; Notable for the following components:   Glucose, Bld 164 (*)    Creatinine, Ser 1.40 (*)    Calcium 8.8 (*)    GFR calc non Af Amer 35 (*)    GFR calc Af Amer 40 (*)    All other components within normal limits  SARS CORONAVIRUS 2 BY RT PCR [facility] ORDER, [place] [facility])    EKG EKG Interpretation  Date/Time:  Friday May 04 2020 17:36:10 EDT Ventricular Rate:  71 PR Interval:    QRS Duration: 71 QT Interval:  464 QTC Calculation: 505 R Axis:   -14 Text Interpretation: Sinus rhythm Low voltage, precordial leads Consider anterior infarct Prolonged QT interval Confirmed by [place] Gambler [phone]) on 05/04/2020 5:38:09 PM   Radiology DG Chest Portable 1 View  Result Date: 05/04/2020 CLINICAL DATA:  Shortness of breath.  Cough. EXAM: PORTABLE CHEST 1 VIEW COMPARISON:  December 20, 2015 FINDINGS: Slight atelectasis noted in the left mid lung. Lungs elsewhere clear. Heart is upper normal in size with pulmonary vascularity normal. No adenopathy. There is aortic atherosclerosis. No bone lesions. IMPRESSION: Mild left midlung atelectasis. No edema or airspace opacity. Heart upper normal in size. Aortic Atherosclerosis (ICD10-I70.0). Electronically Signed   By: Lowella Grip III M.D.   On: 05/04/2020 13:29    Procedures Procedures (including critical care time)  Medications Ordered in ED Medications  albuterol (VENTOLIN HFA) 108 (90 Base) MCG/ACT inhaler 2 puff (has no administration in time range)    ED Course  I have reviewed the triage vital signs and the nursing notes.  Pertinent labs & imaging results that were available during my care of the patient were reviewed by me and  considered in my medical decision making (see chart for details).    MDM Rules/Calculators/A&P  84 year old female presenting for evaluation of shortness of breath and cough.  Took a home Covid test which was positive.  Has also recently been around 2 of her children who have Covid.  She is unvaccinated.  She denies any chest pain, pleuritic pain, hemoptysis.  Reviewed/interpreted lab CBC shows leukopenia consistent with history of Covid.  Mild anemia present BMP with baseline elevated creatinine, otherwise reassuring  EKG Sinus rhythm Low voltage, precordial leads Consider anterior infarct Prolonged QT interval   CXR reviewed/interpreted - mild left midlung atelectasis. No edema or airspace opacity. Heart upper normal in size. Aortic Atherosclerosis   - no evidence of pulmonary edema on cxr, doubt sxs related to heart failure exacerbation.  I personally ambulated the patient in the room and her stats stayed at 93% and above.  She mainly remained 95 to 96%.  She did not become significantly tachypneic with this.  We discussed treatment with symptomatic medications and also discussed possibility of obtaining monoclonal antibody infusion.  Patient would consider this.  Will get confirmatory Covid testing recheck to our clinic.  Infusion clinic was contacted and will set up an appointment for the patient. Patient was given albuterol and cough medication for home. Have advised close follow-up with PCP in 2 days for reassessment. She states she also has a pulse ox at home and I advised if her oxygen drops below 90% she needs to return the emergency department immediately. Advised to return for any new or worsening symptoms. She voiced understanding of the plan and reasons to return. All questions answered. Patient stable for discharge.  Case discussed with Dr. Regenia Skeeter who is in agreement with this plan.   Final Clinical Impression(s) / ED Diagnoses Final diagnoses:   Person under investigation for COVID-19    Rx / DC Orders ED Discharge Orders         Ordered    albuterol (VENTOLIN HFA) 108 (90 Base) MCG/ACT inhaler  Every 6 hours PRN,   Status:  Discontinued        05/04/20 1757           Casimir Barcellos, [employer], PA-C 05/04/20 1853    Kavontae Pritchard S, PA-C 05/04/20 1854    [place] Gambler, MD 05/07/20 [phone]

## 2020-05-04 NOTE — ED Triage Notes (Signed)
Ported recently diagnose with Covid and now c/o shortness of breath and cough.

## 2020-05-04 NOTE — Telephone Encounter (Signed)
Called patient back about message. Patient did a self test at home yesterday and she is positive for COVID. Patient stated at times she has SOB. Encouraged patient to call her PCP to have them instruct her on what she needs to do about her positive COVID test. Patient has not been vaccinated.  Will forward to Dr. Delton See for further advisement.

## 2020-05-05 ENCOUNTER — Telehealth (HOSPITAL_COMMUNITY): Payer: Self-pay | Admitting: Nurse Practitioner

## 2020-05-05 DIAGNOSIS — U071 COVID-19: Secondary | ICD-10-CM | POA: Insufficient documentation

## 2020-05-05 NOTE — Telephone Encounter (Signed)
Called to Discuss with patient about Covid symptoms and the use of regeneron, a monoclonal antibody infusion for those with mild to moderate Covid symptoms and at a high risk of hospitalization.     Pt is qualified for this infusion at the [facility] infusion center due to co-morbid conditions and/or a member of an at-risk group.     Unable to reach pt. No vm. Left message on daughter's phone.  Consuello Masse, DNP, AGNP-C [phone] (Infusion Center Hotline)

## 2020-05-08 ENCOUNTER — Other Ambulatory Visit: Payer: Self-pay | Admitting: Internal Medicine

## 2020-05-09 ENCOUNTER — Other Ambulatory Visit: Payer: Self-pay

## 2020-05-09 ENCOUNTER — Telehealth: Payer: Self-pay

## 2020-05-09 ENCOUNTER — Telehealth (INDEPENDENT_AMBULATORY_CARE_PROVIDER_SITE_OTHER): Payer: Medicare Other | Admitting: Nurse Practitioner

## 2020-05-09 VITALS — BP 142/69

## 2020-05-09 DIAGNOSIS — U071 COVID-19: Secondary | ICD-10-CM

## 2020-05-09 NOTE — Progress Notes (Signed)
Virtual Visit via telephone   This visit type was conducted due to national recommendations for restrictions regarding the COVID-19 Pandemic (e.g. social distancing) in an effort to limit this patient's exposure and mitigate transmission in our community.  Due to her co-morbid illnesses, this patient is at least at moderate risk for complications without adequate follow up.  This format is felt to be most appropriate for this patient at this time.  All issues noted in this document were discussed and addressed.  A limited physical exam was performed with this format.    This visit type was conducted due to national recommendations for restrictions regarding the COVID-19 Pandemic (e.g. social distancing) in an effort to limit this patient's exposure and mitigate transmission in our community.  Patients identity confirmed using two different identifiers.  This format is felt to be most appropriate for this patient at this time.  All issues noted in this document were discussed and addressed.  No physical exam was performed (except for noted visual exam findings with Video Visits).    Date:  05/09/2020   ID:  Sarah Weaver, DOB [birth year], MRN [id number]  Patient Location:    Provider location:   Office    Chief Complaint:  Positive covid  History of Present Illness:    Sarah Weaver is a 84 y.o. female who presents via video conferencing for a telehealth visit today.    The patient does have symptoms concerning for COVID-19 infection (fever, chills, cough, or new shortness of breath).   Telephone visit for covid positive.  Her son tested positive for covid so she was tested on Wednesday August 18th, she has not been vaccinated.  She has been having a cough and mild shortness of breath. She does feel better.   She is staying with her son on [address]  [address]     Past Medical History:  Diagnosis Date  . Arthritis    "all over"  .  Chronic diastolic CHF (congestive heart failure) (HCC)    Echocardiogram 12/2019: EF 60-65, no RWMA, mild LVH, Gr 1 DD, trivial MR, mild AoV sclerosis, no AS  . Chronic lower back pain   . CKD (chronic kidney disease), stage III   . Coronary artery disease    a. status post DES to the LAD 12/2015.  . Glaucoma   . Glaucoma of both eyes   . Hyperkalemia   . Hyperlipidemia   . Hypertension   . Type II diabetes mellitus ([place])    Past Surgical History:  Procedure Laterality Date  . CARDIAC CATHETERIZATION N/A 12/20/2015   Procedure: Left Heart Cath and Coronary Angiography;  Surgeon: Lorretta Harp, MD;  Location: [facility];  Service: Cardiovascular;  Laterality: N/A;  . CARDIAC CATHETERIZATION N/A 12/20/2015   Procedure: Coronary Stent Intervention;  Surgeon: Lorretta Harp, MD;  Location: [facility];  Service: Cardiovascular;  Laterality: N/A;  mid LAD 2.5 x 20 Synergy  . CATARACT EXTRACTION W/ INTRAOCULAR LENS  IMPLANT, BILATERAL Bilateral   . CORONARY ANGIOPLASTY WITH STENT PLACEMENT  12/20/2015  . LAPAROSCOPIC CHOLECYSTECTOMY  04/23/11  . TONSILLECTOMY       Current Meds  Medication Sig  . amLODipine (NORVASC) 10 MG tablet TAKE 1 TABLET BY MOUTH EVERY DAY  . aspirin EC 81 MG tablet Take 81 mg by mouth daily.  Marland Kitchen atorvastatin (LIPITOR) 80 MG tablet TAKE 1 TABLET BY MOUTH DAILY AT 6PM  . benzonatate (TESSALON) 100 MG  capsule Take 1 capsule (100 mg total) by mouth every 8 (eight) hours for 5 days.  . blood glucose meter kit and supplies KIT Dispense based on patient and insurance preference. Use up to four times daily as directed. (FOR ICD-9 250.00, 250.01).  Marland Kitchen brinzolamide (AZOPT) 1 % ophthalmic suspension Place 1 drop into both eyes 3 (three) times daily.   . Cholecalciferol (NATURAL VITAMIN D-3) 5000 units TABS Take 1 tablet by mouth daily.  Marland Kitchen ezetimibe (ZETIA) 10 MG tablet Take 1 tablet (10 mg total) by mouth daily.  . furosemide (LASIX) 40 MG tablet TAKE 1 TABLET BY  MOUTH EVERY DAY  . insulin degludec (TRESIBA FLEXTOUCH) 100 UNIT/ML SOPN FlexTouch Pen Inject 0.1 mLs (10 Units total) into the skin daily.  . Insulin Pen Needle (BD PEN NEEDLE NANO U/F) 32G X 4 MM MISC USE NIGHTLY AS DIRECTED  . Latanoprostene Bunod (VYZULTA) 0.024 % SOLN Apply 1 drop to eye daily.  Marland Kitchen levothyroxine (SYNTHROID) 50 MCG tablet TAKE 1 TABLET BY MOUTH EVERY DAY BEFORE BREAKFAST  . LINZESS 145 MCG CAPS capsule TAKE 1 CAPSULE (145 MCG TOTAL) BY MOUTH DAILY BEFORE BREAKFAST. FOR CONSTIPATION  . losartan (COZAAR) 25 MG tablet TAKE 1 TABLET BY MOUTH EVERY DAY  . nebivolol (BYSTOLIC) 2.5 MG tablet Take 1 tablet (2.5 mg total) by mouth daily.  Glory Rosebush VERIO test strip CHECK BLOOD SUGARS ONCE DAILY  . OZEMPIC, 0.25 OR 0.5 MG/DOSE, 2 MG/1.5ML SOPN INJECT 0.25 MG INTO THE SKIN ONCE A WEEK.  . traMADol-acetaminophen (ULTRACET) 37.5-325 MG tablet Take 1 tablet by mouth every 6 (six) hours as needed for moderate pain (prn back pain).     Allergies:   Carvedilol and Lisinopril   Social History   Tobacco Use  . Smoking status: Never Smoker  . Smokeless tobacco: Never Used  Vaping Use  . Vaping Use: Never used  Substance Use Topics  . Alcohol use: No  . Drug use: No     Family Hx: The patient's family history includes Cancer in her sister; Heart attack in her mother; Heart disease in her mother and sister; Hypertension in her mother; Stroke in her sister.  ROS:   Please see the history of present illness.    Review of Systems  Constitutional: Negative.   Respiratory: Positive for cough. Negative for wheezing.   Cardiovascular: Negative.   Neurological: Negative.   Psychiatric/Behavioral: Negative.     All other systems reviewed and are negative.   Labs/Other Tests and Data Reviewed:    Recent Labs: 12/27/2019: Magnesium 1.8; NT-Pro BNP 107; TSH 2.660 05/04/2020: ALT 31; BUN 15; Creatinine, Ser 1.40; Hemoglobin 11.4; Platelets 157; Potassium 3.9; Sodium 138   Recent Lipid  Panel Lab Results  Component Value Date/Time   CHOL 129 02/16/2020 11:19 AM   TRIG 67 02/16/2020 11:19 AM   HDL 50 02/16/2020 11:19 AM   CHOLHDL 2.6 02/16/2020 11:19 AM   CHOLHDL 3.3 12/21/2015 04:00 AM   LDLCALC 65 02/16/2020 11:19 AM    Wt Readings from Last 3 Encounters:  05/04/20 (!) 303 lb (137.4 kg)  04/23/20 (!) 303 lb (137.4 kg)  02/16/20 (!) 304 lb 9.6 oz (138.2 kg)     Exam:    Vital Signs:  BP (!) 142/69 Comment: pt stated b/p    Physical Exam Vitals reviewed.  Constitutional:      General: She is not in acute distress.    Appearance: Normal appearance.  Pulmonary:     Effort: Pulmonary effort is  normal. No respiratory distress.  Neurological:     General: No focal deficit present.     Mental Status: She is alert and oriented to person, place, and time.     Cranial Nerves: No cranial nerve deficit.  Psychiatric:        Mood and Affect: Mood normal.        Behavior: Behavior normal.        Thought Content: Thought content normal.        Judgment: Judgment normal.     ASSESSMENT & PLAN:    1. COVID-19 virus infection  She is having minimal symptoms, she is encouraged to stay well hydrated with water and treat symptom management  Will send Remote health to make a visit to see how she is doing  If she has shortness of breath or chest pain she is to go to ER for further evaluation    COVID-19 Education: The signs and symptoms of COVID-19 were discussed with the patient and how to seek care for testing (follow up with PCP or arrange E-visit).  The importance of social distancing was discussed today.  Patient Risk:   After full review of this patients clinical status, I feel that they are at least moderate risk at this time.  Time:   Today, I have spent 12 minutes/ seconds with the patient with telehealth technology discussing above diagnoses.     Medication Adjustments/Labs and Tests Ordered: Current medicines are reviewed at length with the  patient today.  Concerns regarding medicines are outlined above.   Tests Ordered: No orders of the defined types were placed in this encounter.   Medication Changes: No orders of the defined types were placed in this encounter.   Disposition:  Follow up prn  Signed, [name], FNP

## 2020-05-09 NOTE — Telephone Encounter (Signed)
Patient consented to a virtual appointment. YL,RMA 

## 2020-05-11 ENCOUNTER — Other Ambulatory Visit: Payer: Self-pay | Admitting: Cardiology

## 2020-05-16 ENCOUNTER — Other Ambulatory Visit: Payer: Self-pay | Admitting: Nurse Practitioner

## 2020-05-17 ENCOUNTER — Encounter: Payer: Medicare Other | Admitting: Internal Medicine

## 2020-05-24 ENCOUNTER — Encounter: Payer: Medicare Other | Admitting: Nurse Practitioner

## 2020-05-31 ENCOUNTER — Ambulatory Visit: Payer: Medicare Other

## 2020-05-31 ENCOUNTER — Encounter: Payer: Medicare Other | Admitting: Nurse Practitioner

## 2020-06-03 ENCOUNTER — Other Ambulatory Visit: Payer: Self-pay | Admitting: Nurse Practitioner

## 2020-06-06 ENCOUNTER — Encounter: Payer: Self-pay | Admitting: Nurse Practitioner

## 2020-06-07 DIAGNOSIS — I129 Hypertensive chronic kidney disease with stage 1 through stage 4 chronic kidney disease, or unspecified chronic kidney disease: Secondary | ICD-10-CM | POA: Diagnosis not present

## 2020-06-07 DIAGNOSIS — N183 Chronic kidney disease, stage 3 unspecified: Secondary | ICD-10-CM | POA: Diagnosis not present

## 2020-06-07 DIAGNOSIS — E1122 Type 2 diabetes mellitus with diabetic chronic kidney disease: Secondary | ICD-10-CM | POA: Diagnosis not present

## 2020-06-07 DIAGNOSIS — R809 Proteinuria, unspecified: Secondary | ICD-10-CM | POA: Diagnosis not present

## 2020-06-07 LAB — BASIC METABOLIC PANEL
BUN: 15 (ref 4–21)
CO2: 27 — AB (ref 13–22)
Chloride: 101 (ref 99–108)
Creatinine: 1.2 — AB (ref 0.5–1.1)
Glucose: 197
Potassium: 4.4 (ref 3.4–5.3)
Sodium: 137 (ref 137–147)

## 2020-06-07 LAB — COMPREHENSIVE METABOLIC PANEL
Albumin: 3.9 (ref 3.5–5.0)
Calcium: 9.9 (ref 8.7–10.7)
GFR calc Af Amer: 49
GFR calc non Af Amer: 43

## 2020-06-15 ENCOUNTER — Ambulatory Visit: Payer: Medicare Other

## 2020-07-11 ENCOUNTER — Ambulatory Visit (INDEPENDENT_AMBULATORY_CARE_PROVIDER_SITE_OTHER): Payer: Medicare Other | Admitting: Nurse Practitioner

## 2020-07-11 ENCOUNTER — Encounter: Payer: Self-pay | Admitting: Nurse Practitioner

## 2020-07-11 ENCOUNTER — Ambulatory Visit (INDEPENDENT_AMBULATORY_CARE_PROVIDER_SITE_OTHER): Payer: Medicare Other

## 2020-07-11 ENCOUNTER — Other Ambulatory Visit: Payer: Self-pay

## 2020-07-11 ENCOUNTER — Other Ambulatory Visit: Payer: Self-pay | Admitting: Nurse Practitioner

## 2020-07-11 VITALS — BP 148/80 | HR 86 | Temp 98.3°F | Ht 64.8 in | Wt 300.0 lb

## 2020-07-11 DIAGNOSIS — N1831 Chronic kidney disease, stage 3a: Secondary | ICD-10-CM

## 2020-07-11 DIAGNOSIS — Z0000 Encounter for general adult medical examination without abnormal findings: Secondary | ICD-10-CM

## 2020-07-11 DIAGNOSIS — Z23 Encounter for immunization: Secondary | ICD-10-CM | POA: Diagnosis not present

## 2020-07-11 DIAGNOSIS — Z1159 Encounter for screening for other viral diseases: Secondary | ICD-10-CM | POA: Diagnosis not present

## 2020-07-11 DIAGNOSIS — E0822 Diabetes mellitus due to underlying condition with diabetic chronic kidney disease: Secondary | ICD-10-CM | POA: Diagnosis not present

## 2020-07-11 DIAGNOSIS — Z794 Long term (current) use of insulin: Secondary | ICD-10-CM

## 2020-07-11 DIAGNOSIS — I1310 Hypertensive heart and chronic kidney disease without heart failure, with stage 1 through stage 4 chronic kidney disease, or unspecified chronic kidney disease: Secondary | ICD-10-CM

## 2020-07-11 DIAGNOSIS — E7849 Other hyperlipidemia: Secondary | ICD-10-CM | POA: Diagnosis not present

## 2020-07-11 DIAGNOSIS — Z8616 Personal history of COVID-19: Secondary | ICD-10-CM

## 2020-07-11 DIAGNOSIS — I10 Essential (primary) hypertension: Secondary | ICD-10-CM

## 2020-07-11 MED ORDER — NYSTATIN 100000 UNIT/GM EX POWD: 1.0000 "application " | Freq: Three times a day (TID) | CUTANEOUS | 0 refills | Status: DC

## 2020-07-11 NOTE — Progress Notes (Signed)
This visit occurred during the SARS-CoV-2 public health emergency.  Safety protocols were in place, including screening questions prior to the visit, additional usage of staff PPE, and extensive cleaning of exam room while observing appropriate contact time as indicated for disinfecting solutions.  Subjective:   Sarah Weaver is a 84 y.o. female who presents for Medicare Annual (Subsequent) preventive examination.  Review of Systems     Cardiac Risk Factors include: advanced age (>22mn, >>30women);diabetes mellitus;obesity (BMI >30kg/m2);sedentary lifestyle     Objective:    Today's Vitals   07/11/20 1004  BP: (!) 148/80  Pulse: 86  Temp: 98.3 F (36.8 C)  TempSrc: Oral  SpO2: 97%  Weight: 300 lb (136.1 kg)  Height: 5' 4.8" (1.646 m)   Body mass index is 50.23 kg/m.  Advanced Directives 07/11/2020 05/04/2020 05/05/2019 08/10/2018 12/20/2015  Does Patient Have a Medical Advance Directive? No No No No No  Would patient like information on creating a medical advance directive? - No - Patient declined - No - Patient declined No - patient declined information    Current Medications (verified) Outpatient Encounter Medications as of 07/11/2020  Medication Sig  . amLODipine (NORVASC) 10 MG tablet TAKE 1 TABLET BY MOUTH EVERY DAY  . aspirin EC 81 MG tablet Take 81 mg by mouth daily.  .Marland Kitchenatorvastatin (LIPITOR) 80 MG tablet TAKE 1 TABLET BY MOUTH DAILY AT 6PM  . blood glucose meter kit and supplies KIT Dispense based on patient and insurance preference. Use up to four times daily as directed. (FOR ICD-9 250.00, 250.01).  .Marland Kitchenbrinzolamide (AZOPT) 1 % ophthalmic suspension Place 1 drop into both eyes 3 (three) times daily.   . Cholecalciferol (NATURAL VITAMIN D-3) 5000 units TABS Take 1 tablet by mouth daily.  .Marland Kitchenezetimibe (ZETIA) 10 MG tablet Take 1 tablet (10 mg total) by mouth daily.  . furosemide (LASIX) 40 MG tablet TAKE 1 TABLET BY MOUTH EVERY DAY  . insulin degludec  (TRESIBA FLEXTOUCH) 100 UNIT/ML SOPN FlexTouch Pen Inject 0.1 mLs (10 Units total) into the skin daily.  . Insulin Pen Needle (BD PEN NEEDLE NANO U/F) 32G X 4 MM MISC USE NIGHTLY AS DIRECTED  . Latanoprostene Bunod (VYZULTA) 0.024 % SOLN Apply 1 drop to eye daily.  .Marland Kitchenlevothyroxine (SYNTHROID) 50 MCG tablet TAKE 1 TABLET BY MOUTH EVERY DAY BEFORE BREAKFAST  . LINZESS 145 MCG CAPS capsule TAKE 1 CAPSULE (145 MCG TOTAL) BY MOUTH DAILY BEFORE BREAKFAST. FOR CONSTIPATION  . losartan (COZAAR) 25 MG tablet TAKE 1 TABLET BY MOUTH EVERY DAY  . nebivolol (BYSTOLIC) 2.5 MG tablet Take 1 tablet (2.5 mg total) by mouth daily.  .Glory RosebushVERIO test strip CHECK BLOOD SUGARS ONCE DAILY  . OZEMPIC, 0.25 OR 0.5 MG/DOSE, 2 MG/1.5ML SOPN INJECT 0.25 MG INTO THE SKIN ONCE A WEEK.  . traMADol-acetaminophen (ULTRACET) 37.5-325 MG tablet Take 1 tablet by mouth every 6 (six) hours as needed for moderate pain (prn back pain).  . [DISCONTINUED] albuterol (VENTOLIN HFA) 108 (90 Base) MCG/ACT inhaler Inhale 1-2 puffs into the lungs every 6 (six) hours as needed for wheezing or shortness of breath.   No facility-administered encounter medications on file as of 07/11/2020.    Allergies (verified) Carvedilol and Lisinopril   History: Past Medical History:  Diagnosis Date  . Arthritis    "all over"  . Chronic diastolic CHF (congestive heart failure) (HCC)    Echocardiogram 12/2019: EF 60-65, no RWMA, mild LVH, Gr 1 DD, trivial MR, mild AoV  sclerosis, no AS  . Chronic lower back pain   . CKD (chronic kidney disease), stage III ([place])   . Coronary artery disease    a. status post DES to the LAD 12/2015.  . Glaucoma   . Glaucoma of both eyes   . Hyperkalemia   . Hyperlipidemia   . Hypertension   . Type II diabetes mellitus ([place])    Past Surgical History:  Procedure Laterality Date  . CARDIAC CATHETERIZATION N/A 12/20/2015   Procedure: Left Heart Cath and Coronary Angiography;  Surgeon: Lorretta Harp, MD;   Location: [facility];  Service: Cardiovascular;  Laterality: N/A;  . CARDIAC CATHETERIZATION N/A 12/20/2015   Procedure: Coronary Stent Intervention;  Surgeon: Lorretta Harp, MD;  Location: [facility];  Service: Cardiovascular;  Laterality: N/A;  mid LAD 2.5 x 20 Synergy  . CATARACT EXTRACTION W/ INTRAOCULAR LENS  IMPLANT, BILATERAL Bilateral   . CORONARY ANGIOPLASTY WITH STENT PLACEMENT  12/20/2015  . LAPAROSCOPIC CHOLECYSTECTOMY  04/23/11  . TONSILLECTOMY     Family History  Problem Relation Age of Onset  . Heart disease Mother   . Heart attack Mother   . Hypertension Mother   . Heart disease Sister   . Cancer Sister   . Stroke Sister    Social History   Socioeconomic History  . Marital status: Divorced    Spouse name: Not on file  . Number of children: Not on file  . Years of education: Not on file  . Highest education level: Not on file  Occupational History  . Occupation: retired  Tobacco Use  . Smoking status: Never Smoker  . Smokeless tobacco: Never Used  Vaping Use  . Vaping Use: Never used  Substance and Sexual Activity  . Alcohol use: No  . Drug use: No  . Sexual activity: Not Currently  Other Topics Concern  . Not on file  Social History Narrative  . Not on file   Social Determinants of Health   Financial Resource Strain: Low Risk   . Difficulty of Paying Living Expenses: Not hard at all  Food Insecurity: No Food Insecurity  . Worried About Charity fundraiser in the Last Year: Never true  . Ran Out of Food in the Last Year: Never true  Transportation Needs: No Transportation Needs  . Lack of Transportation (Medical): No  . Lack of Transportation (Non-Medical): No  Physical Activity: Inactive  . Days of Exercise per Week: 0 days  . Minutes of Exercise per Session: 0 min  Stress: No Stress Concern Present  . Feeling of Stress : Not at all  Social Connections:   . Frequency of Communication with Friends and Family: Not on file  .  Frequency of Social Gatherings with Friends and Family: Not on file  . Attends Religious Services: Not on file  . Active Member of Clubs or Organizations: Not on file  . Attends Archivist Meetings: Not on file  . Marital Status: Not on file    Tobacco Counseling Counseling given: Not Answered   Clinical Intake:  Pre-visit preparation completed: Yes  Pain : No/denies pain     Nutritional Status: BMI > 30  Obese Nutritional Risks: None Diabetes: Yes  How often do you need to have someone help you when you read instructions, pamphlets, or other written materials from your doctor or pharmacy?: 1 - Never What is the last grade level you completed in school?: 9th grade  Diabetic? Yes Nutrition  Risk Assessment:  Has the patient had any N/V/D within the last 2 months?  No  Does the patient have any non-healing wounds?  No  Has the patient had any unintentional weight loss or weight gain?  No   Diabetes:  Is the patient diabetic?  Yes  If diabetic, was a CBG obtained today?  No  Did the patient bring in their glucometer from home?  No  How often do you monitor your CBG's? Does not.   Financial Strains and Diabetes Management:  Are you having any financial strains with the device, your supplies or your medication? No .  Does the patient want to be seen by Chronic Care Management for management of their diabetes?  No  Would the patient like to be referred to a Nutritionist or for Diabetic Management?  No   Diabetic Exams:  Diabetic Eye Exam: Patient had eye exam on 01/2020, results pending. Diabetic Foot Exam: Completed today   Interpreter Needed?: No  Information entered by :: NAllen LPN   Activities of Daily Living In your present state of health, do you have any difficulty performing the following activities: 07/11/2020 05/09/2020  Hearing? N N  Vision? N N  Difficulty concentrating or making decisions? N N  Walking or climbing stairs? N N  Dressing or  bathing? N N  Doing errands, shopping? N N  Preparing Food and eating ? N -  Using the Toilet? N -  In the past six months, have you accidently leaked urine? N -  Do you have problems with loss of bowel control? N -  Managing your Medications? N -  Managing your Finances? N -  Housekeeping or managing your Housekeeping? N -  Some recent data might be hidden    Patient Care Team: [name], FNP as PCP - General ([place]) Dorothy Spark, MD as PCP - Cardiology (Cardiology)  Indicate any recent Medical Services you may have received from other than [facility] providers in the past year (date may be approximate).     Assessment:   This is a routine wellness examination for Arizbeth.  Hearing/Vision screen  Hearing Screening   125Hz  250Hz  500Hz  1000Hz  2000Hz  3000Hz  4000Hz  6000Hz  8000Hz   Right ear:           Left ear:           Vision Screening Comments: Regular eye exams, Dr. Efrain Sella  Dietary issues and exercise activities discussed: Current Exercise Habits: The patient does not participate in regular exercise at present  Goals    . Patient Stated     05/05/2019, stay free of corona virus    . Patient Stated     07/11/2020, no goals      Depression Screen PHQ 2/9 Scores 07/11/2020 08/25/2019 05/17/2019 05/05/2019 01/27/2019 09/16/2018 08/10/2018  PHQ - 2 Score 0 0 0 0 0 0 0  PHQ- 9 Score 0 - - 1 - - -    Fall Risk Fall Risk  07/11/2020 08/25/2019 06/30/2019 06/23/2019 06/16/2019  Falls in the past year? 0 0 0 0 0  Number falls in past yr: - - - - -  Injury with Fall? - - - - -  Risk for fall due to : Impaired mobility;Medication side effect - - - -  Follow up Falls evaluation completed;Education provided;Falls prevention discussed - - - -    Any stairs in or around the home? No  If so, are there any without handrails? n/a Home free of loose throw  rugs in walkways, pet beds, electrical cords, etc? Yes  Adequate lighting in your home to reduce risk of falls? Yes    ASSISTIVE DEVICES UTILIZED TO PREVENT FALLS:  Life alert? No  Use of a cane, walker or w/c? Yes  Grab bars in the bathroom? No  Shower chair or bench in shower? No  Elevated toilet seat or a handicapped toilet? No   TIMED UP AND GO:  Was the test performed? No .   Gait slow and steady with assistive device  Cognitive Function:     6CIT Screen 07/11/2020 05/05/2019 08/10/2018  What Year? 0 points 0 points 0 points  What month? 0 points 0 points 0 points  What time? 0 points 0 points 0 points  Count back from 20 0 points 0 points 0 points  Months in reverse 0 points 0 points 0 points  Repeat phrase 2 points 0 points 0 points  Total Score 2 0 0    Immunizations Immunization History  Administered Date(s) Administered  . Fluad Quad(high Dose 65+) 07/11/2020  . Influenza, High Dose Seasonal PF 07/29/2017, 07/08/2018, 06/23/2019  . Influenza-Unspecified 06/15/2018  . Pneumococcal Polysaccharide-23 04/24/2011, 07/29/2017  . Tdap 09/22/2018    TDAP status: Up to date Flu Vaccine status: Completed at today's visit Pneumococcal vaccine status: Up to date Covid-19 vaccine status: Declined, Education has been provided regarding the importance of this vaccine but patient still declined. Advised may receive this vaccine at local pharmacy or Health Dept.or vaccine clinic. Aware to provide a copy of the vaccination record if obtained from local pharmacy or Health Dept. Verbalized acceptance and understanding.  Qualifies for Shingles Vaccine? Yes   Zostavax completed No   Shingrix Completed?: No.    Education has been provided regarding the importance of this vaccine. Patient has been advised to call insurance company to determine out of pocket expense if they have not yet received this vaccine. Advised may also receive vaccine at local pharmacy or Health Dept. Verbalized acceptance and understanding.  Screening Tests Health Maintenance  Topic Date Due  . COVID-19 Vaccine (1) Never  done  . OPHTHALMOLOGY EXAM  04/08/2019  . FOOT EXAM  06/15/2020  . HEMOGLOBIN A1C  08/17/2020  . TETANUS/TDAP  09/22/2028  . INFLUENZA VACCINE  Completed  . DEXA SCAN  Completed  . PNA vac Low Risk Adult  Completed    Health Maintenance  Health Maintenance Due  Topic Date Due  . COVID-19 Vaccine (1) Never done  . OPHTHALMOLOGY EXAM  04/08/2019  . FOOT EXAM  06/15/2020    Colorectal cancer screening: No longer required.  Mammogram status: No longer required.  Bone Density status: Completed 10/11/2019. Results reflect: Bone density results: NORMAL. Repeat every 0 years.  Lung Cancer Screening: (Low Dose CT Chest recommended if Age 91-80 years, 30 pack-year currently smoking OR have quit w/in 15years.) does not qualify.   Lung Cancer Screening Referral: no  Additional Screening:  Hepatitis C Screening: does not qualify;   Vision Screening: Recommended annual ophthalmology exams for early detection of glaucoma and other disorders of the eye. Is the patient up to date with their annual eye exam?  Yes  Who is the provider or what is the name of the office in which the patient attends annual eye exams? Dr. Efrain Sella If pt is not established with a provider, would they like to be referred to a provider to establish care? No .   Dental Screening: Recommended annual dental exams for proper oral hygiene  Community Resource Referral / Chronic Care Management: CRR required this visit?  No   CCM required this visit?  No      Plan:     I have personally reviewed and noted the following in the patient's chart:   . Medical and social history . Use of alcohol, tobacco or illicit drugs  . Current medications and supplements . Functional ability and status . Nutritional status . Physical activity . Advanced directives . List of other physicians . Hospitalizations, surgeries, and ER visits in previous 12 months . Vitals . Screenings to include cognitive, depression, and  falls . Referrals and appointments  In addition, I have reviewed and discussed with patient certain preventive protocols, quality metrics, and best practice recommendations. A written personalized care plan for preventive services as well as general preventive health recommendations were provided to patient.     Kellie Simmering, LPN   [id number]   Nurse Notes:

## 2020-07-11 NOTE — Patient Instructions (Signed)
Sarah Weaver , Thank you for taking time to come for your Medicare Wellness Visit. I appreciate your ongoing commitment to your health goals. Please review the following plan we discussed and let me know if I can assist you in the future.   Screening recommendations/referrals: Colonoscopy: not required Mammogram: not required Bone Density: completed 10/11/2019 Recommended yearly ophthalmology/optometry visit for glaucoma screening and checkup Recommended yearly dental visit for hygiene and checkup  Vaccinations: Influenza vaccine: today Pneumococcal vaccine: completed 07/29/2017 Tdap vaccine: completed 09/22/2018, due 09/22/2028 Shingles vaccine: discussed   Covid-19: declined, thinking about  Advanced directives: Advance directive discussed with you today. Even though you declined this today please call our office should you change your mind and we can give you the proper paperwork for you to fill out.   Conditions/risks identified: none  Next appointment: 07/17/2021 at 9:00 Follow up in one year for your annual wellness visit    Preventive Care 65 Years and Older, Female Preventive care refers to lifestyle choices and visits with your health care provider that can promote health and wellness. What does preventive care include?  A yearly physical exam. This is also called an annual well check.  Dental exams once or twice a year.  Routine eye exams. Ask your health care provider how often you should have your eyes checked.  Personal lifestyle choices, including:  Daily care of your teeth and gums.  Regular physical activity.  Eating a healthy diet.  Avoiding tobacco and drug use.  Limiting alcohol use.  Practicing safe sex.  Taking low-dose aspirin every day.  Taking vitamin and mineral supplements as recommended by your health care provider. What happens during an annual well check? The services and screenings done by your health care provider during your annual  well check will depend on your age, overall health, lifestyle risk factors, and family history of disease. Counseling  Your health care provider may ask you questions about your:  Alcohol use.  Tobacco use.  Drug use.  Emotional well-being.  Home and relationship well-being.  Sexual activity.  Eating habits.  History of falls.  Memory and ability to understand (cognition).  Work and work Astronomer.  Reproductive health. Screening  You may have the following tests or measurements:  Height, weight, and BMI.  Blood pressure.  Lipid and cholesterol levels. These may be checked every 5 years, or more frequently if you are over 84 years old.  Skin check.  Lung cancer screening. You may have this screening every year starting at age 67 if you have a 30-pack-year history of smoking and currently smoke or have quit within the past 15 years.  Fecal occult blood test (FOBT) of the stool. You may have this test every year starting at age 13.  Flexible sigmoidoscopy or colonoscopy. You may have a sigmoidoscopy every 5 years or a colonoscopy every 10 years starting at age 78.  Hepatitis C blood test.  Hepatitis B blood test.  Sexually transmitted disease (STD) testing.  Diabetes screening. This is done by checking your blood sugar (glucose) after you have not eaten for a while (fasting). You may have this done every 1-3 years.  Bone density scan. This is done to screen for osteoporosis. You may have this done starting at age 47.  Mammogram. This may be done every 1-2 years. Talk to your health care provider about how often you should have regular mammograms. Talk with your health care provider about your test results, treatment options, and if necessary, the need  for more tests. Vaccines  Your health care provider may recommend certain vaccines, such as:  Influenza vaccine. This is recommended every year.  Tetanus, diphtheria, and acellular pertussis (Tdap, Td) vaccine.  You may need a Td booster every 10 years.  Zoster vaccine. You may need this after age 26.  Pneumococcal 13-valent conjugate (PCV13) vaccine. One dose is recommended after age 62.  Pneumococcal polysaccharide (PPSV23) vaccine. One dose is recommended after age 84. Talk to your health care provider about which screenings and vaccines you need and how often you need them. This information is not intended to replace advice given to you by your health care provider. Make sure you discuss any questions you have with your health care provider. Document Released: 09/28/2015 Document Revised: 05/21/2016 Document Reviewed: 07/03/2015 Elsevier Interactive Patient Education  2017 [place] Prevention in the Home Falls can cause injuries. They can happen to people of all ages. There are many things you can do to make your home safe and to help prevent falls. What can I do on the outside of my home?  Regularly fix the edges of walkways and driveways and fix any cracks.  Remove anything that might make you trip as you walk through a door, such as a raised step or threshold.  Trim any bushes or trees on the path to your home.  Use bright outdoor lighting.  Clear any walking paths of anything that might make someone trip, such as rocks or tools.  Regularly check to see if handrails are loose or broken. Make sure that both sides of any steps have handrails.  Any raised decks and porches should have guardrails on the edges.  Have any leaves, snow, or ice cleared regularly.  Use sand or salt on walking paths during winter.  Clean up any spills in your garage right away. This includes oil or grease spills. What can I do in the bathroom?  Use night lights.  Install grab bars by the toilet and in the tub and shower. Do not use towel bars as grab bars.  Use non-skid mats or decals in the tub or shower.  If you need to sit down in the shower, use a plastic, non-slip stool.  Keep the  floor dry. Clean up any water that spills on the floor as soon as it happens.  Remove soap buildup in the tub or shower regularly.  Attach bath mats securely with double-sided non-slip rug tape.  Do not have throw rugs and other things on the floor that can make you trip. What can I do in the bedroom?  Use night lights.  Make sure that you have a light by your bed that is easy to reach.  Do not use any sheets or blankets that are too big for your bed. They should not hang down onto the floor.  Have a firm chair that has side arms. You can use this for support while you get dressed.  Do not have throw rugs and other things on the floor that can make you trip. What can I do in the kitchen?  Clean up any spills right away.  Avoid walking on wet floors.  Keep items that you use a lot in easy-to-reach places.  If you need to reach something above you, use a strong step stool that has a grab bar.  Keep electrical cords out of the way.  Do not use floor polish or wax that makes floors slippery. If you must use wax, use  non-skid floor wax.  Do not have throw rugs and other things on the floor that can make you trip. What can I do with my stairs?  Do not leave any items on the stairs.  Make sure that there are handrails on both sides of the stairs and use them. Fix handrails that are broken or loose. Make sure that handrails are as long as the stairways.  Check any carpeting to make sure that it is firmly attached to the stairs. Fix any carpet that is loose or worn.  Avoid having throw rugs at the top or bottom of the stairs. If you do have throw rugs, attach them to the floor with carpet tape.  Make sure that you have a light switch at the top of the stairs and the bottom of the stairs. If you do not have them, ask someone to add them for you. What else can I do to help prevent falls?  Wear shoes that:  Do not have high heels.  Have rubber bottoms.  Are comfortable and fit  you well.  Are closed at the toe. Do not wear sandals.  If you use a stepladder:  Make sure that it is fully opened. Do not climb a closed stepladder.  Make sure that both sides of the stepladder are locked into place.  Ask someone to hold it for you, if possible.  Clearly mark and make sure that you can see:  Any grab bars or handrails.  First and last steps.  Where the edge of each step is.  Use tools that help you move around (mobility aids) if they are needed. These include:  Canes.  Walkers.  Scooters.  Crutches.  Turn on the lights when you go into a dark area. Replace any light bulbs as soon as they burn out.  Set up your furniture so you have a clear path. Avoid moving your furniture around.  If any of your floors are uneven, fix them.  If there are any pets around you, be aware of where they are.  Review your medicines with your doctor. Some medicines can make you feel dizzy. This can increase your chance of falling. Ask your doctor what other things that you can do to help prevent falls. This information is not intended to replace advice given to you by your health care provider. Make sure you discuss any questions you have with your health care provider. Document Released: 06/28/2009 Document Revised: 02/07/2016 Document Reviewed: 10/06/2014 Elsevier Interactive Patient Education  2017 [employer].

## 2020-07-11 NOTE — Patient Instructions (Signed)
Health Maintenance, Female Adopting a healthy lifestyle and getting preventive care are important in promoting health and wellness. Ask your health care provider about:  The right schedule for you to have regular tests and exams.  Things you can do on your own to prevent diseases and keep yourself healthy. What should I know about diet, weight, and exercise? Eat a healthy diet   Eat a diet that includes plenty of vegetables, fruits, low-fat dairy products, and lean protein.  Do not eat a lot of foods that are high in solid fats, added sugars, or sodium. Maintain a healthy weight Body mass index (BMI) is used to identify weight problems. It estimates body fat based on height and weight. Your health care provider can help determine your BMI and help you achieve or maintain a healthy weight. Get regular exercise Get regular exercise. This is one of the most important things you can do for your health. Most adults should:  Exercise for at least 150 minutes each week. The exercise should increase your heart rate and make you sweat (moderate-intensity exercise).  Do strengthening exercises at least twice a week. This is in addition to the moderate-intensity exercise.  Spend less time sitting. Even light physical activity can be beneficial. Watch cholesterol and blood lipids Have your blood tested for lipids and cholesterol at 82 years of age, then have this test every 5 years. Have your cholesterol levels checked more often if:  Your lipid or cholesterol levels are high.  You are older than 82 years of age.  You are at high risk for heart disease. What should I know about cancer screening? Depending on your health history and family history, you may need to have cancer screening at various ages. This may include screening for:  Breast cancer.  Cervical cancer.  Colorectal cancer.  Skin cancer.  Lung cancer. What should I know about heart disease, diabetes, and high blood  pressure? Blood pressure and heart disease  High blood pressure causes heart disease and increases the risk of stroke. This is more likely to develop in people who have high blood pressure readings, are of African descent, or are overweight.  Have your blood pressure checked: ? Every 3-5 years if you are 18-39 years of age. ? Every year if you are 40 years old or older. Diabetes Have regular diabetes screenings. This checks your fasting blood sugar level. Have the screening done:  Once every three years after age 40 if you are at a normal weight and have a low risk for diabetes.  More often and at a younger age if you are overweight or have a high risk for diabetes. What should I know about preventing infection? Hepatitis B If you have a higher risk for hepatitis B, you should be screened for this virus. Talk with your health care provider to find out if you are at risk for hepatitis B infection. Hepatitis C Testing is recommended for:  Everyone born from 1945 through 1965.  Anyone with known risk factors for hepatitis C. Sexually transmitted infections (STIs)  Get screened for STIs, including gonorrhea and chlamydia, if: ? You are sexually active and are younger than 82 years of age. ? You are older than 82 years of age and your health care provider tells you that you are at risk for this type of infection. ? Your sexual activity has changed since you were last screened, and you are at increased risk for chlamydia or gonorrhea. Ask your health care provider if   you are at risk.  Ask your health care provider about whether you are at high risk for HIV. Your health care provider may recommend a prescription medicine to help prevent HIV infection. If you choose to take medicine to prevent HIV, you should first get tested for HIV. You should then be tested every 3 months for as long as you are taking the medicine. Pregnancy  If you are about to stop having your period (premenopausal) and  you may become pregnant, seek counseling before you get pregnant.  Take 400 to 800 micrograms (mcg) of folic acid every day if you become pregnant.  Ask for birth control (contraception) if you want to prevent pregnancy. Osteoporosis and menopause Osteoporosis is a disease in which the bones lose minerals and strength with aging. This can result in bone fractures. If you are 65 years old or older, or if you are at risk for osteoporosis and fractures, ask your health care provider if you should:  Be screened for bone loss.  Take a calcium or vitamin D supplement to lower your risk of fractures.  Be given hormone replacement therapy (HRT) to treat symptoms of menopause. Follow these instructions at home: Lifestyle  Do not use any products that contain nicotine or tobacco, such as cigarettes, e-cigarettes, and chewing tobacco. If you need help quitting, ask your health care provider.  Do not use street drugs.  Do not share needles.  Ask your health care provider for help if you need support or information about quitting drugs. Alcohol use  Do not drink alcohol if: ? Your health care provider tells you not to drink. ? You are pregnant, may be pregnant, or are planning to become pregnant.  If you drink alcohol: ? Limit how much you use to 0-1 drink a day. ? Limit intake if you are breastfeeding.  Be aware of how much alcohol is in your drink. In the [place] one drink equals one 12 oz bottle of beer (355 mL), one 5 oz glass of wine (148 mL), or one 1 oz glass of hard liquor (44 mL). General instructions  Schedule regular health, dental, and eye exams.  Stay current with your vaccines.  Tell your health care provider if: ? You often feel depressed. ? You have ever been abused or do not feel safe at home. Summary  Adopting a healthy lifestyle and getting preventive care are important in promoting health and wellness.  Follow your health care provider's instructions about healthy  diet, exercising, and getting tested or screened for diseases.  Follow your health care provider's instructions on monitoring your cholesterol and blood pressure. This information is not intended to replace advice given to you by your health care provider. Make sure you discuss any questions you have with your health care provider. Document Revised: 08/25/2018 Document Reviewed: 08/25/2018 Elsevier Patient Education  2020 Elsevier Inc.  

## 2020-07-11 NOTE — Progress Notes (Signed)
This visit occurred during the SARS-CoV-2 public health emergency.  Safety protocols were in place, including screening questions prior to the visit, additional usage of staff PPE, and extensive cleaning of exam room while observing appropriate contact time as indicated for disinfecting solutions.  Subjective:     Patient ID: Sarah Weaver , female    DOB: 10-16-1935 , 84 y.o.   MRN: [id number]   Chief Complaint  Patient presents with  . Annual Exam    HPI  Here for HM   She had covid 3 months ago but did not have many symptoms except for a cough.    She has seen Dr. Shelbie Ammons since her last visit. She is to see Dr. Meda Coffee next month.  She does not drive, her granddaughter brought her this appt.        Past Medical History:  Diagnosis Date  . Arthritis    "all over"  . Chronic diastolic CHF (congestive heart failure) (HCC)    Echocardiogram 12/2019: EF 60-65, no RWMA, mild LVH, Gr 1 DD, trivial MR, mild AoV sclerosis, no AS  . Chronic lower back pain   . CKD (chronic kidney disease), stage III ([place])   . Coronary artery disease    a. status post DES to the LAD 12/2015.  . Glaucoma   . Glaucoma of both eyes   . Hyperkalemia   . Hyperlipidemia   . Hypertension   . Type II diabetes mellitus (HCC)      Family History  Problem Relation Age of Onset  . Heart disease Mother   . Heart attack Mother   . Hypertension Mother   . Heart disease Sister   . Cancer Sister   . Stroke Sister      Current Outpatient Medications:  .  amLODipine (NORVASC) 10 MG tablet, TAKE 1 TABLET BY MOUTH EVERY DAY, Disp: 90 tablet, Rfl: 0 .  aspirin EC 81 MG tablet, Take 81 mg by mouth daily., Disp: , Rfl:  .  atorvastatin (LIPITOR) 80 MG tablet, TAKE 1 TABLET BY MOUTH DAILY AT 6PM, Disp: 90 tablet, Rfl: 2 .  blood glucose meter kit and supplies KIT, Dispense based on patient and insurance preference. Use up to four times daily as directed. (FOR ICD-9 250.00, 250.01)., Disp: 1 each,  Rfl: 0 .  brinzolamide (AZOPT) 1 % ophthalmic suspension, Place 1 drop into both eyes 3 (three) times daily. , Disp: , Rfl:  .  BYSTOLIC 2.5 MG tablet, TAKE 1 TABLET BY MOUTH EVERY DAY, Disp: 90 tablet, Rfl: 2 .  Cholecalciferol (NATURAL VITAMIN D-3) 5000 units TABS, Take 1 tablet by mouth daily., Disp: , Rfl:  .  ezetimibe (ZETIA) 10 MG tablet, Take 1 tablet (10 mg total) by mouth daily., Disp: 90 tablet, Rfl: 3 .  furosemide (LASIX) 40 MG tablet, TAKE 1 TABLET BY MOUTH EVERY DAY, Disp: 90 tablet, Rfl: 1 .  insulin degludec (TRESIBA FLEXTOUCH) 100 UNIT/ML FlexTouch Pen, Inject 10 Units into the skin daily., Disp: 15 mL, Rfl: 3 .  Insulin Pen Needle (BD PEN NEEDLE NANO U/F) 32G X 4 MM MISC, USE NIGHTLY AS DIRECTED, Disp: 100 each, Rfl: 3 .  Latanoprostene Bunod (VYZULTA) 0.024 % SOLN, Apply 1 drop to eye daily., Disp: , Rfl:  .  levothyroxine (SYNTHROID) 50 MCG tablet, TAKE 1 TABLET BY MOUTH EVERY DAY BEFORE BREAKFAST, Disp: 90 tablet, Rfl: 1 .  LINZESS 145 MCG CAPS capsule, TAKE 1 CAPSULE (145 MCG TOTAL) BY MOUTH DAILY BEFORE BREAKFAST. FOR CONSTIPATION,  Disp: 90 capsule, Rfl: 0 .  losartan (COZAAR) 25 MG tablet, TAKE 1 TABLET BY MOUTH EVERY DAY, Disp: 90 tablet, Rfl: 2 .  nystatin (NYSTATIN) powder, Apply 1 application topically 3 (three) times daily., Disp: 15 g, Rfl: 0 .  ONETOUCH VERIO test strip, CHECK BLOOD SUGARS ONCE DAILY, Disp: 50 strip, Rfl: 11 .  OZEMPIC, 0.25 OR 0.5 MG/DOSE, 2 MG/1.5ML SOPN, INJECT 0.25 MG INTO THE SKIN ONCE A WEEK., Disp: 1.5 mL, Rfl: 3 .  traMADol-acetaminophen (ULTRACET) 37.5-325 MG tablet, Take 1 tablet by mouth every 6 (six) hours as needed for moderate pain (prn back pain)., Disp: 30 tablet, Rfl: 0   Allergies  Allergen Reactions  . Carvedilol Cough    Pt reports causes her to cough  . Lisinopril Cough    REPORTS CAUSES DRY COUGH      The patient states she is post menopausal status   . Negative for Dysmenorrhea and Negative for Menorrhagia. Negative  for: breast discharge, breast lump(s), breast pain and breast self exam. Associated symptoms include abnormal vaginal bleeding. Pertinent negatives include abnormal bleeding (hematology), anxiety, decreased libido, depression, difficulty falling sleep, dyspareunia, history of infertility, nocturia, sexual dysfunction, sleep disturbances, urinary incontinence, urinary urgency, vaginal discharge and vaginal itching. Diet regular. The patient states her exercise level is none, she uses a rollator seated walker.     . The patient's tobacco use is:  Social History   Tobacco Use  Smoking Status Never Smoker  Smokeless Tobacco Never Used  She has been exposed to passive smoke. The patient's alcohol use is:  Social History   Substance and Sexual Activity  Alcohol Use No    Review of Systems  Constitutional: Negative.   HENT: Negative.   Eyes: Negative.   Respiratory: Negative.   Cardiovascular: Negative.   Gastrointestinal: Negative.   Endocrine: Negative.   Genitourinary: Negative.   Musculoskeletal: Negative.   Skin: Negative.   Allergic/Immunologic: Negative.   Neurological: Negative.   Hematological: Negative.   Psychiatric/Behavioral: Negative.      Today's Vitals   07/11/20 1022  BP: (!) 148/80  Pulse: 86  Temp: 98.3 F (36.8 C)  Weight: 300 lb (136.1 kg)  Height: 5' 4.8" (1.646 m)   Body mass index is 50.23 kg/m.   Objective:  Physical Exam Constitutional:      General: She is not in acute distress.    Appearance: Normal appearance. She is well-developed. She is obese.  HENT:     Head: Normocephalic and atraumatic.     Right Ear: Hearing, tympanic membrane, ear canal and external ear normal. There is no impacted cerumen.     Left Ear: Hearing, tympanic membrane, ear canal and external ear normal. There is no impacted cerumen.     Nose:     Comments: Deferred - masked    Mouth/Throat:     Comments: Deferred - masked Eyes:     General: Lids are normal.      Extraocular Movements: Extraocular movements intact.     Conjunctiva/sclera: Conjunctivae normal.     Pupils: Pupils are equal, round, and reactive to light.     Funduscopic exam:    Right eye: No papilledema.        Left eye: No papilledema.  Neck:     Thyroid: No thyroid mass.     Vascular: No carotid bruit.  Cardiovascular:     Rate and Rhythm: Normal rate and regular rhythm.     Pulses: Normal pulses.  Heart sounds: Normal heart sounds. No murmur heard.   Pulmonary:     Effort: Pulmonary effort is normal.     Breath sounds: Normal breath sounds.  Chest:     Chest wall: No mass.  Breasts:     Tanner Score is 5.     Right: Normal. No mass, tenderness, axillary adenopathy or supraclavicular adenopathy.     Left: Normal. No mass, tenderness, axillary adenopathy or supraclavicular adenopathy.    Abdominal:     General: Abdomen is flat. Bowel sounds are normal. There is no distension.     Palpations: Abdomen is soft.     Tenderness: There is no abdominal tenderness.  Genitourinary:    Rectum: Guaiac result negative.  Musculoskeletal:        General: No swelling. Normal range of motion.     Cervical back: Full passive range of motion without pain, normal range of motion and neck supple.     Right lower leg: No edema.     Left lower leg: No edema.  Lymphadenopathy:     Upper Body:     Right upper body: No supraclavicular, axillary or pectoral adenopathy.     Left upper body: No supraclavicular, axillary or pectoral adenopathy.  Skin:    General: Skin is warm and dry.     Capillary Refill: Capillary refill takes less than 2 seconds.     Findings: Rash (underneath bilateral breast) present.  Neurological:     General: No focal deficit present.     Mental Status: She is alert and oriented to person, place, and time.     Cranial Nerves: No cranial nerve deficit.     Sensory: No sensory deficit.  Psychiatric:        Mood and Affect: Mood normal.        Behavior: Behavior  normal.        Thought Content: Thought content normal.        Judgment: Judgment normal.         Assessment And Plan:     1. Encounter for health maintenance examination . Behavior modifications discussed and diet history reviewed.   . Pt will continue to exercise regularly and modify diet with low GI, plant based foods and decrease intake of processed foods.  . Recommend intake of daily multivitamin, Vitamin D, and calcium.  . Recommend mammogram and colonoscopy (she is aged out) for preventive screenings, as well as recommend immunizations that include influenza, TDAP (up to date) - CMP14+EGFR - CBC  2. Encounter for hepatitis C screening test for low risk patient  Will check Hepatitis C screening due to recent recommendations to screen all adults 18 years and older - Hepatitis C antibody  3. Diabetes mellitus due to underlying condition with stage 3a chronic kidney disease, with long-term current use of insulin (HCC)  Chronic, she is fairly controlled   Continue with current medications  She is encouraged to have her eye exam done - Hemoglobin A1c  4. Primary hypertension  Chronic, fair control  Continue with current medications  5. Other hyperlipidemia Chronic, stable Continue with low fat diet  6. History of COVID-19 Overall she is doing well.     Patient was given opportunity to ask questions. Patient verbalized understanding of the plan and was able to repeat key elements of the plan. All questions were answered to their satisfaction.   [name], FNP    I, [name], FNP, have reviewed all documentation for this visit. The documentation  on 09/14/20 for the exam, diagnosis, procedures, and orders are all accurate and complete.    THE PATIENT IS ENCOURAGED TO PRACTICE SOCIAL DISTANCING DUE TO THE COVID-19 PANDEMIC.

## 2020-07-12 LAB — CMP14+EGFR
ALT: 23 IU/L (ref 0–32)
AST: 22 IU/L (ref 0–40)
Albumin/Globulin Ratio: 1.3 (ref 1.2–2.2)
Albumin: 4.3 g/dL (ref 3.6–4.6)
Alkaline Phosphatase: 90 IU/L (ref 44–121)
BUN/Creatinine Ratio: 17 (ref 12–28)
BUN: 22 mg/dL (ref 8–27)
Bilirubin Total: 0.2 mg/dL (ref 0.0–1.2)
CO2: 20 mmol/L (ref 20–29)
Calcium: 10 mg/dL (ref 8.7–10.3)
Chloride: 101 mmol/L (ref 96–106)
Creatinine, Ser: 1.31 mg/dL — ABNORMAL HIGH (ref 0.57–1.00)
GFR calc Af Amer: 44 mL/min/{1.73_m2} — ABNORMAL LOW (ref >59)
GFR calc non Af Amer: 38 mL/min/{1.73_m2} — ABNORMAL LOW (ref >59)
Globulin, Total: 3.2 g/dL (ref 1.5–4.5)
Glucose: 188 mg/dL — ABNORMAL HIGH (ref 65–99)
Potassium: 4.1 mmol/L (ref 3.5–5.2)
Sodium: 144 mmol/L (ref 134–144)
Total Protein: 7.5 g/dL (ref 6.0–8.5)

## 2020-07-12 LAB — CBC
Hematocrit: 39.3 % (ref 34.0–46.6)
Hemoglobin: 12.5 g/dL (ref 11.1–15.9)
MCH: 28.5 pg (ref 26.6–33.0)
MCHC: 31.8 g/dL (ref 31.5–35.7)
MCV: 90 fL (ref 79–97)
Platelets: 204 10*3/uL (ref 150–450)
RBC: 4.38 x10E6/uL (ref 3.77–5.28)
RDW: 13.1 % (ref 11.7–15.4)
WBC: 4.2 10*3/uL (ref 3.4–10.8)

## 2020-07-12 LAB — HEMOGLOBIN A1C
Est. average glucose Bld gHb Est-mCnc: 163 mg/dL
Hgb A1c MFr Bld: 7.3 % — ABNORMAL HIGH (ref 4.8–5.6)

## 2020-07-12 LAB — HEPATITIS C ANTIBODY: Hep C Virus Ab: <0.1 s/co ratio (ref 0.0–0.9)

## 2020-07-13 LAB — SPECIMEN STATUS REPORT

## 2020-07-13 LAB — LIPID PANEL

## 2020-07-17 ENCOUNTER — Encounter: Payer: Self-pay | Admitting: Nurse Practitioner

## 2020-08-04 ENCOUNTER — Other Ambulatory Visit: Payer: Self-pay | Admitting: Physician Assistant

## 2020-08-08 ENCOUNTER — Other Ambulatory Visit: Payer: Self-pay | Admitting: Nurse Practitioner

## 2020-08-12 ENCOUNTER — Other Ambulatory Visit: Payer: Self-pay | Admitting: Physician Assistant

## 2020-08-29 ENCOUNTER — Other Ambulatory Visit: Payer: Self-pay | Admitting: Nurse Practitioner

## 2020-08-30 ENCOUNTER — Other Ambulatory Visit: Payer: Self-pay

## 2020-08-30 DIAGNOSIS — E119 Type 2 diabetes mellitus without complications: Secondary | ICD-10-CM

## 2020-08-30 MED ORDER — TRESIBA FLEXTOUCH 100 UNIT/ML ~~LOC~~ SOPN: 10.0000 [IU] | PEN_INJECTOR | Freq: Every day | SUBCUTANEOUS | 3 refills | Status: DC

## 2020-10-25 DIAGNOSIS — Z961 Presence of intraocular lens: Secondary | ICD-10-CM | POA: Diagnosis not present

## 2020-10-25 DIAGNOSIS — E113292 Type 2 diabetes mellitus with mild nonproliferative diabetic retinopathy without macular edema, left eye: Secondary | ICD-10-CM | POA: Diagnosis not present

## 2020-10-25 DIAGNOSIS — H401131 Primary open-angle glaucoma, bilateral, mild stage: Secondary | ICD-10-CM | POA: Diagnosis not present

## 2020-10-25 LAB — HM DIABETES EYE EXAM

## 2020-10-28 ENCOUNTER — Other Ambulatory Visit: Payer: Self-pay | Admitting: Nurse Practitioner

## 2020-11-06 ENCOUNTER — Encounter: Payer: Self-pay | Admitting: Nurse Practitioner

## 2020-11-06 ENCOUNTER — Ambulatory Visit (INDEPENDENT_AMBULATORY_CARE_PROVIDER_SITE_OTHER): Payer: Medicare Other | Admitting: Nurse Practitioner

## 2020-11-06 ENCOUNTER — Other Ambulatory Visit: Payer: Self-pay

## 2020-11-06 ENCOUNTER — Other Ambulatory Visit: Payer: Self-pay | Admitting: Nurse Practitioner

## 2020-11-06 VITALS — BP 140/80 | HR 77 | Temp 98.3°F | Ht 64.8 in | Wt 297.8 lb

## 2020-11-06 DIAGNOSIS — I5032 Chronic diastolic (congestive) heart failure: Secondary | ICD-10-CM | POA: Diagnosis not present

## 2020-11-06 DIAGNOSIS — I700 Atherosclerosis of aorta: Secondary | ICD-10-CM | POA: Diagnosis not present

## 2020-11-06 DIAGNOSIS — I129 Hypertensive chronic kidney disease with stage 1 through stage 4 chronic kidney disease, or unspecified chronic kidney disease: Secondary | ICD-10-CM | POA: Diagnosis not present

## 2020-11-06 DIAGNOSIS — N1831 Chronic kidney disease, stage 3a: Secondary | ICD-10-CM | POA: Diagnosis not present

## 2020-11-06 DIAGNOSIS — E7849 Other hyperlipidemia: Secondary | ICD-10-CM | POA: Diagnosis not present

## 2020-11-06 DIAGNOSIS — Z794 Long term (current) use of insulin: Secondary | ICD-10-CM | POA: Diagnosis not present

## 2020-11-06 DIAGNOSIS — R252 Cramp and spasm: Secondary | ICD-10-CM

## 2020-11-06 DIAGNOSIS — Z6841 Body Mass Index (BMI) 40.0 and over, adult: Secondary | ICD-10-CM

## 2020-11-06 DIAGNOSIS — I10 Essential (primary) hypertension: Secondary | ICD-10-CM

## 2020-11-06 DIAGNOSIS — E0822 Diabetes mellitus due to underlying condition with diabetic chronic kidney disease: Secondary | ICD-10-CM

## 2020-11-06 MED ORDER — BLOOD GLUCOSE MONITOR KIT: PACK | 0 refills | Status: AC

## 2020-11-06 MED ORDER — MAGNESIUM 250 MG PO TABS: 1.0000 | ORAL_TABLET | Freq: Every evening | ORAL | 1 refills | Status: DC

## 2020-11-06 NOTE — Progress Notes (Signed)
I,Yamilka Roman [employer] as a Education administrator for [employer], FNP.,have documented all relevant documentation on the behalf of [name], FNP,as directed by  [name], FNP while in the presence of [name], [place]. This visit occurred during the SARS-CoV-2 public health emergency.  Safety protocols were in place, including screening questions prior to the visit, additional usage of staff PPE, and extensive cleaning of exam room while observing appropriate contact time as indicated for disinfecting solutions.  Subjective:     Patient ID: Sarah Weaver , female    DOB: [birth year] , 85 y.o.   MRN: [id number]   Chief Complaint  Patient presents with  . Diabetes  . Hyperlipidemia    HPI  Patient presents today for a f/u on her diabetes and cholesterol. She has seen her eye doctor (Dr. Marinda Elk) at [facility]. She is not due to see her cardiologist until May. She has a son with prostate cancer and is planning to have chemotherapy she would like to be with him during that time. He is deaf as well.   Wt Readings from Last 3 Encounters: 11/06/20 : 297 lb 12.8 oz (135.1 kg) 07/11/20 : 300 lb (136.1 kg) 07/11/20 : 300 lb (136.1 kg)    Past Medical History:  Diagnosis Date  . Arthritis    "all over"  . Chronic diastolic CHF (congestive heart failure) (HCC)    Echocardiogram 12/2019: EF 60-65, no RWMA, mild LVH, Gr 1 DD, trivial MR, mild AoV sclerosis, no AS  . Chronic lower back pain   . CKD (chronic kidney disease), stage III ([place])   . Coronary artery disease    a. status post DES to the LAD 12/2015.  . Glaucoma   . Glaucoma of both eyes   . Hyperkalemia   . Hyperlipidemia   . Hypertension   . Type II diabetes mellitus (HCC)      Family History  Problem Relation Age of Onset  . Heart disease Mother   . Heart attack Mother   . Hypertension Mother   . Heart disease Sister   . Cancer Sister   . Stroke Sister      Current Outpatient  Medications:  .  aspirin EC 81 MG tablet, Take 81 mg by mouth daily., Disp: , Rfl:  .  atorvastatin (LIPITOR) 80 MG tablet, TAKE 1 TABLET BY MOUTH DAILY AT 6PM, Disp: 90 tablet, Rfl: 2 .  brinzolamide (AZOPT) 1 % ophthalmic suspension, Place 1 drop into both eyes 3 (three) times daily., Disp: , Rfl:  .  BYSTOLIC 2.5 MG tablet, TAKE 1 TABLET BY MOUTH EVERY DAY, Disp: 90 tablet, Rfl: 2 .  Cholecalciferol 125 MCG (5000 UT) TABS, Take 1 tablet by mouth daily., Disp: , Rfl:  .  ezetimibe (ZETIA) 10 MG tablet, Take 1 tablet (10 mg total) by mouth daily., Disp: 90 tablet, Rfl: 3 .  insulin degludec (TRESIBA FLEXTOUCH) 100 UNIT/ML FlexTouch Pen, Inject 10 Units into the skin daily., Disp: 15 mL, Rfl: 3 .  Insulin Pen Needle (BD PEN NEEDLE NANO U/F) 32G X 4 MM MISC, USE NIGHTLY AS DIRECTED, Disp: 100 each, Rfl: 3 .  Latanoprostene Bunod 0.024 % SOLN, Apply 1 drop to eye daily., Disp: , Rfl:  .  levothyroxine (SYNTHROID) 50 MCG tablet, TAKE 1 TABLET BY MOUTH EVERY DAY BEFORE BREAKFAST, Disp: 90 tablet, Rfl: 1 .  losartan (COZAAR) 25 MG tablet, TAKE 1 TABLET BY MOUTH EVERY DAY, Disp: 90 tablet, Rfl: 2 .  Magnesium  250 MG TABS, Take 1 tablet (250 mg total) by mouth every evening. Take with evening meal, Disp: 90 tablet, Rfl: 1 .  nystatin (NYSTATIN) powder, Apply 1 application topically 3 (three) times daily., Disp: 15 g, Rfl: 0 .  ONETOUCH VERIO test strip, CHECK BLOOD SUGARS ONCE DAILY, Disp: 50 strip, Rfl: 11 .  amLODipine (NORVASC) 10 MG tablet, TAKE 1 TABLET BY MOUTH EVERY DAY, Disp: 90 tablet, Rfl: 0 .  blood glucose meter kit and supplies KIT, Dispense based on patient and insurance preference. Use up to four times daily as directed. (FOR ICD-9 250.00, 250.01)., Disp: 1 each, Rfl: 0 .  furosemide (LASIX) 40 MG tablet, TAKE 1 TABLET BY MOUTH EVERY DAY, Disp: 90 tablet, Rfl: 1 .  LINZESS 145 MCG CAPS capsule, TAKE 1 CAPSULE (145 MCG TOTAL) BY MOUTH DAILY BEFORE BREAKFAST. FOR CONSTIPATION, Disp: 90  capsule, Rfl: 0 .  OZEMPIC, 0.25 OR 0.5 MG/DOSE, 2 MG/1.5ML SOPN, INJECT 0.25 MG INTO THE SKIN ONCE A WEEK., Disp: 3 mL, Rfl: 3 .  traMADol-acetaminophen (ULTRACET) 37.5-325 MG tablet, Take 1 tablet by mouth every 6 (six) hours as needed for moderate pain (prn back pain)., Disp: 30 tablet, Rfl: 0   Allergies  Allergen Reactions  . Carvedilol Cough    Pt reports causes her to cough  . Lisinopril Cough    REPORTS CAUSES DRY COUGH     Review of Systems  Constitutional: Negative.   HENT: Negative.   Eyes: Negative.   Respiratory: Negative.   Cardiovascular: Negative.   Gastrointestinal: Negative.   Endocrine: Negative.   Genitourinary: Negative.   Musculoskeletal: Negative.   Skin: Negative.   Neurological: Negative.   Hematological: Negative.   Psychiatric/Behavioral: Negative.      Today's Vitals   11/06/20 1114  BP: 140/80  Pulse: 77  Temp: 98.3 F (36.8 C)  TempSrc: Oral  Weight: 297 lb 12.8 oz (135.1 kg)  Height: 5' 4.8" (1.646 m)  PainSc: 0-No pain   Body mass index is 49.86 kg/m.   Objective:  Physical Exam Constitutional:      General: She is not in acute distress.    Appearance: Normal appearance. She is obese.  Cardiovascular:     Rate and Rhythm: Normal rate and regular rhythm.     Pulses: Normal pulses.     Heart sounds: Normal heart sounds. No murmur heard.   Pulmonary:     Effort: Pulmonary effort is normal. No respiratory distress.     Breath sounds: Normal breath sounds. No wheezing.  Musculoskeletal:        General: No swelling or tenderness. Normal range of motion.     Cervical back: Normal range of motion and neck supple.     Right lower leg: No edema.     Left lower leg: No edema.  Skin:    General: Skin is warm and dry.     Capillary Refill: Capillary refill takes less than 2 seconds.  Neurological:     General: No focal deficit present.     Mental Status: She is alert and oriented to person, place, and time.  Psychiatric:         Mood and Affect: Mood normal.        Behavior: Behavior normal.        Thought Content: Thought content normal.        Judgment: Judgment normal.         Assessment And Plan:     1. Diabetes mellitus due to  underlying condition with stage 3a chronic kidney disease, with long-term current use of insulin ([place]) . I have ordered her a glucometer to check her blood sugars as her other machine is broken . Will also refer to [facility] for nurse  . Chronic, she is to continue with current medications pending results - blood glucose meter kit and supplies KIT; Dispense based on patient and insurance preference. Use up to four times daily as directed. (FOR ICD-9 250.00, 250.01).  Dispense: 1 each; Refill: 0 - AMB Referral to [facility] - Hemoglobin A1c  2. Other hyperlipidemia  Chronic, controlled  Continue with current medications - Lipid panel  3. Atherosclerosis of aorta ([place])  4. Hand cramps . She is to take magnesium for muscle cramping - Magnesium 250 MG TABS; Take 1 tablet (250 mg total) by mouth every evening. Take with evening meal  Dispense: 90 tablet; Refill: 1  5. Stage 3a chronic kidney disease ([place])  6. Essential hypertension . Chronic, fair control  7. Chronic diastolic CHF (congestive heart failure) (HCC) Stable, no recent exacerbations  8. Class 3 severe obesity due to excess calories with serious comorbidity in adult, unspecified BMI ([place]) . She is encouraged to eat a healthy diet and do chair exercises as tolerated . She is encouraged to strive for BMI less than 30 to decrease cardiac risk. Advised to aim for at least 150 minutes of exercise per week.      Patient was given opportunity to ask questions. Patient verbalized understanding of the plan and was able to repeat key elements of the plan. All questions were answered to their satisfaction.  [name], FNP   I, [name], FNP, have reviewed all documentation for this visit.  The documentation on 11/06/20 for the exam, diagnosis, procedures, and orders are all accurate and complete.   THE PATIENT IS ENCOURAGED TO PRACTICE SOCIAL DISTANCING DUE TO THE COVID-19 PANDEMIC.

## 2020-11-06 NOTE — Patient Instructions (Signed)
Diabetes Mellitus Basics  Diabetes mellitus, or diabetes, is a long-term (chronic) disease. It occurs when the body does not properly use sugar (glucose) that is released from food after you eat. Diabetes mellitus may be caused by one or both of these problems:  Your pancreas does not make enough of a hormone called insulin.  Your body does not react in a normal way to the insulin that it makes. Insulin lets glucose enter cells in your body. This gives you energy. If you have diabetes, glucose cannot get into cells. This causes high blood glucose (hyperglycemia). How to treat and manage diabetes You may need to take insulin or other diabetes medicines daily to keep your glucose in balance. If you are prescribed insulin, you will learn how to give yourself insulin by injection. You may need to adjust the amount of insulin you take based on the foods that you eat. You will need to check your blood glucose levels using a glucose monitor as told by your health care provider. The readings can help determine if you have low or high blood glucose. Generally, you should have these blood glucose levels:  Before meals (preprandial): 80-130 mg/dL (4.4-7.2 mmol/L).  After meals (postprandial): below 180 mg/dL (10 mmol/L).  Hemoglobin A1c (HbA1c) level: less than 7%. Your health care provider will set treatment goals for you. Keep all follow-up visits. This is important. Follow these instructions at home: Diabetes medicines Take your diabetes medicines every day as told by your health care provider. List your diabetes medicines here:  Name of medicine: ______________________________ ? Amount (dose): _______________ Time (a.m./p.m.): _______________ Notes: ___________________________________  Name of medicine: ______________________________ ? Amount (dose): _______________ Time (a.m./p.m.): _______________ Notes: ___________________________________  Name of medicine:  ______________________________ ? Amount (dose): _______________ Time (a.m./p.m.): _______________ Notes: ___________________________________ Insulin If you use insulin, list the types of insulin you use here:  Insulin type: ______________________________ ? Amount (dose): _______________ Time (a.m./p.m.): _______________Notes: ___________________________________  Insulin type: ______________________________ ? Amount (dose): _______________ Time (a.m./p.m.): _______________ Notes: ___________________________________  Insulin type: ______________________________ ? Amount (dose): _______________ Time (a.m./p.m.): _______________ Notes: ___________________________________  Insulin type: ______________________________ ? Amount (dose): _______________ Time (a.m./p.m.): _______________ Notes: ___________________________________  Insulin type: ______________________________ ? Amount (dose): _______________ Time (a.m./p.m.): _______________ Notes: ___________________________________ Managing blood glucose Check your blood glucose levels using a glucose monitor as told by your health care provider. Write down the times that you check your glucose levels here:  Time: _______________ Notes: ___________________________________  Time: _______________ Notes: ___________________________________  Time: _______________ Notes: ___________________________________  Time: _______________ Notes: ___________________________________  Time: _______________ Notes: ___________________________________  Time: _______________ Notes: ___________________________________   Low blood glucose Low blood glucose (hypoglycemia) is when glucose is at or below 70 mg/dL (3.9 mmol/L). Symptoms may include:  Feeling: ? Hungry. ? Sweaty and clammy. ? Irritable or easily upset. ? Dizzy. ? Sleepy.  Having: ? A fast heartbeat. ? A headache. ? A change in your vision. ? Numbness around the mouth, lips, or  tongue.  Having trouble with: ? Moving (coordination). ? Sleeping. Treating low blood glucose To treat low blood glucose, eat or drink something containing sugar right away. If you can think clearly and swallow safely, follow the 15:15 rule:  Take 15 grams of a fast-acting carb (carbohydrate), as told by your health care provider.  Some fast-acting carbs are: ? Glucose tablets: take 3-4 tablets. ? Hard candy: eat 3-5 pieces. ? Fruit juice: drink 4 oz (120 mL). ? Regular (not diet) soda: drink 4-6 oz (120-180 mL). ? Honey or sugar:   eat 1 Tbsp (15 mL).  Check your blood glucose levels 15 minutes after you take the carb.  If your glucose is still at or below 70 mg/dL (3.9 mmol/L), take 15 grams of a carb again.  If your glucose does not go above 70 mg/dL (3.9 mmol/L) after 3 tries, get help right away.  After your glucose goes back to normal, eat a meal or a snack within 1 hour. Treating very low blood glucose If your glucose is at or below 54 mg/dL (3 mmol/L), you have very low blood glucose (severe hypoglycemia). This is an emergency. Do not wait to see if the symptoms will go away. Get medical help right away. Call your local emergency services (911 in the [place] Do not drive yourself to the hospital. Questions to ask your health care provider  Should I talk with a diabetes educator?  What equipment will I need to care for myself at home?  What diabetes medicines do I need? When should I take them?  How often do I need to check my blood glucose levels?  What number can I call if I have questions?  When is my follow-up visit?  Where can I find a support group for people with diabetes? Where to find more information  American Diabetes Association: www.diabetes.org  Association of Diabetes Care and Education Specialists: www.diabeteseducator.org Contact a health care provider if:  Your blood glucose is at or above 240 mg/dL (13.3 mmol/L) for 2 days in a row.  You have  been sick or have had a fever for 2 days or more, and you are not getting better.  You have any of these problems for more than 6 hours: ? You cannot eat or drink. ? You feel nauseous. ? You vomit. ? You have diarrhea. Get help right away if:  Your blood glucose is lower than 54 mg/dL (3 mmol/L).  You get confused.  You have trouble thinking clearly.  You have trouble breathing. These symptoms may represent a serious problem that is an emergency. Do not wait to see if the symptoms will go away. Get medical help right away. Call your local emergency services (911 in the [place] Do not drive yourself to the hospital. Summary  Diabetes mellitus is a chronic disease that occurs when the body does not properly use sugar (glucose) that is released from food after you eat.  Take insulin and diabetes medicines as told.  Check your blood glucose every day, as often as told.  Keep all follow-up visits. This is important. This information is not intended to replace advice given to you by your health care provider. Make sure you discuss any questions you have with your health care provider. Document Revised: 01/03/2020 Document Reviewed: 01/03/2020 Elsevier Patient Education  2021 [employer]  

## 2020-11-07 ENCOUNTER — Encounter: Payer: Self-pay | Admitting: Nurse Practitioner

## 2020-11-07 LAB — LIPID PANEL
Chol/HDL Ratio: 2.5 ratio (ref 0.0–4.4)
Cholesterol, Total: 130 mg/dL (ref 100–199)
HDL: 51 mg/dL (ref >39)
LDL Chol Calc (NIH): 63 mg/dL (ref 0–99)
Triglycerides: 83 mg/dL (ref 0–149)
VLDL Cholesterol Cal: 16 mg/dL (ref 5–40)

## 2020-11-07 LAB — CMP14+EGFR
ALT: 21 IU/L (ref 0–32)
AST: 23 IU/L (ref 0–40)
Albumin/Globulin Ratio: 1.4 (ref 1.2–2.2)
Albumin: 4.1 g/dL (ref 3.6–4.6)
Alkaline Phosphatase: 95 IU/L (ref 44–121)
BUN/Creatinine Ratio: 14 (ref 12–28)
BUN: 22 mg/dL (ref 8–27)
Bilirubin Total: 0.5 mg/dL (ref 0.0–1.2)
CO2: 22 mmol/L (ref 20–29)
Calcium: 9.6 mg/dL (ref 8.7–10.3)
Chloride: 100 mmol/L (ref 96–106)
Creatinine, Ser: 1.6 mg/dL — ABNORMAL HIGH (ref 0.57–1.00)
GFR calc Af Amer: 34 mL/min/{1.73_m2} — ABNORMAL LOW (ref >59)
GFR calc non Af Amer: 30 mL/min/{1.73_m2} — ABNORMAL LOW (ref >59)
Globulin, Total: 3 g/dL (ref 1.5–4.5)
Glucose: 146 mg/dL — ABNORMAL HIGH (ref 65–99)
Potassium: 4.4 mmol/L (ref 3.5–5.2)
Sodium: 141 mmol/L (ref 134–144)
Total Protein: 7.1 g/dL (ref 6.0–8.5)

## 2020-11-07 LAB — HEMOGLOBIN A1C
Est. average glucose Bld gHb Est-mCnc: 163 mg/dL
Hgb A1c MFr Bld: 7.3 % — ABNORMAL HIGH (ref 4.8–5.6)

## 2020-11-13 ENCOUNTER — Other Ambulatory Visit: Payer: Self-pay | Admitting: Cardiology

## 2020-11-13 ENCOUNTER — Other Ambulatory Visit: Payer: Self-pay | Admitting: Nurse Practitioner

## 2020-11-14 ENCOUNTER — Other Ambulatory Visit: Payer: Self-pay | Admitting: Nurse Practitioner

## 2020-11-14 DIAGNOSIS — M545 Low back pain, unspecified: Secondary | ICD-10-CM

## 2020-11-14 DIAGNOSIS — G8929 Other chronic pain: Secondary | ICD-10-CM

## 2020-11-14 MED ORDER — TRAMADOL-ACETAMINOPHEN 37.5-325 MG PO TABS: 1.0000 | ORAL_TABLET | Freq: Four times a day (QID) | ORAL | 0 refills | Status: DC | PRN

## 2020-11-22 ENCOUNTER — Telehealth: Payer: Self-pay | Admitting: *Deleted

## 2020-11-22 NOTE — Chronic Care Management (AMB) (Signed)
  Chronic Care Management   Outreach Note  11/22/2020 Name: Ellissa Ayo MRN: [id number] DOB: [birth year]  Takesha Steger is a 85 y.o. year old female who is a primary care patient of [name], FNP. I reached out to Christoper Allegra by phone today in response to a referral sent by Ms. Thomas Hoff Gautney's PCP, [name], FNP.     An unsuccessful telephone outreach was attempted today. The patient was referred to the case management team for assistance with care management and care coordination.   Follow Up Plan: The care management team will reach out to the patient again over the next 7 days.  If patient returns call to provider office, please advise to call Embedded Care Management Care Guide Gwenevere Ghazi* at [phone].Misty Stanley Yanuel Tagg  Care Guide, Embedded Care Coordination [facility] Management

## 2020-11-28 ENCOUNTER — Other Ambulatory Visit: Payer: Self-pay | Admitting: Nurse Practitioner

## 2020-12-05 NOTE — Chronic Care Management (AMB) (Signed)
  Chronic Care Management   Note  12/05/2020 Name: Rhona Fusilier MRN: [id number] DOB: Aug 07, 1936  Tangie Stay is a 85 y.o. year old female who is a primary care patient of [name], [place]. I reached out to [name] by phone today in response to a referral sent by Ms. Driscilla Moats Wlodarczyk's PCP, [name], FNP.     Ms. Buckles was given information about Chronic Care Management services today including:  1. CCM service includes personalized support from designated clinical staff supervised by her physician, including individualized plan of care and coordination with other care providers 2. 24/7 contact phone numbers for assistance for urgent and routine care needs. 3. Service will only be billed when office clinical staff spend 20 minutes or more in a month to coordinate care. 4. Only one practitioner may furnish and bill the service in a calendar month. 5. The patient may stop CCM services at any time (effective at the end of the month) by phone call to the office staff. 6. The patient will be responsible for cost sharing (co-pay) of up to 20% of the service fee (after annual deductible is met).  Patient agreed to services and verbal consent obtained.   Follow up plan: Telephone appointment with care management team member scheduled for: PharmD 12/12/2020 And [facility] 12/21/2020  [facility]

## 2020-12-11 ENCOUNTER — Telehealth: Payer: Self-pay

## 2020-12-11 NOTE — Chronic Care Management (AMB) (Signed)
Chronic Care Management Pharmacy Assistant   Name: Makenna Macaluso  MRN: [id number] DOB: 28-Jun-1936   Reason for Encounter: Medication Review/Initial Questions for Pharmacist visit on 12/12/2020.    Recent office visits:  11/06/20-[place], Doreene Burke, [place] ([place]).  Recent consult visits:  None.  Hospital visits:  None in previous 6 months   Medication changes indicated: Magnesium 250 mg Oral Every evening, Take with evening meal.   Medications: Outpatient Encounter Medications as of 12/11/2020  Medication Sig  . amLODipine (NORVASC) 10 MG tablet TAKE 1 TABLET BY MOUTH EVERY DAY  . aspirin EC 81 MG tablet Take 81 mg by mouth daily.  Marland Kitchen atorvastatin (LIPITOR) 80 MG tablet TAKE 1 TABLET BY MOUTH DAILY AT 6PM  . blood glucose meter kit and supplies KIT Dispense based on patient and insurance preference. Use up to four times daily as directed. (FOR ICD-9 250.00, 250.01).  Marland Kitchen brinzolamide (AZOPT) 1 % ophthalmic suspension Place 1 drop into both eyes 3 (three) times daily.  Marland Kitchen BYSTOLIC 2.5 MG tablet TAKE 1 TABLET BY MOUTH EVERY DAY  . Cholecalciferol 125 MCG (5000 UT) TABS Take 1 tablet by mouth daily.  Marland Kitchen ezetimibe (ZETIA) 10 MG tablet Take 1 tablet (10 mg total) by mouth daily.  . furosemide (LASIX) 40 MG tablet TAKE 1 TABLET BY MOUTH EVERY DAY  . insulin degludec (TRESIBA FLEXTOUCH) 100 UNIT/ML FlexTouch Pen Inject 10 Units into the skin daily.  . Insulin Pen Needle (BD PEN NEEDLE NANO U/F) 32G X 4 MM MISC USE NIGHTLY AS DIRECTED  . Latanoprostene Bunod 0.024 % SOLN Apply 1 drop to eye daily.  Marland Kitchen levothyroxine (SYNTHROID) 50 MCG tablet TAKE 1 TABLET BY MOUTH EVERY DAY BEFORE BREAKFAST  . LINZESS 145 MCG CAPS capsule TAKE 1 CAPSULE (145 MCG TOTAL) BY MOUTH DAILY BEFORE BREAKFAST. FOR CONSTIPATION  . losartan (COZAAR) 25 MG tablet TAKE 1 TABLET BY MOUTH EVERY DAY  . Magnesium 250 MG TABS Take 1 tablet (250 mg total) by mouth every evening. Take with evening meal  . nystatin  (NYSTATIN) powder Apply 1 application topically 3 (three) times daily.  Glory Rosebush VERIO test strip CHECK BLOOD SUGARS ONCE DAILY  . OZEMPIC, 0.25 OR 0.5 MG/DOSE, 2 MG/1.5ML SOPN INJECT 0.25 MG INTO THE SKIN ONCE A WEEK.  . traMADol-acetaminophen (ULTRACET) 37.5-325 MG tablet Take 1 tablet by mouth every 6 (six) hours as needed for moderate pain (prn back pain).  . [DISCONTINUED] albuterol (VENTOLIN HFA) 108 (90 Base) MCG/ACT inhaler Inhale 1-2 puffs into the lungs every 6 (six) hours as needed for wheezing or shortness of breath.   No facility-administered encounter medications on file as of 12/11/2020.    Have you seen any other providers since your last visit? No  Any changes in your medications or health? No.  Any side effects from any medications? No.  Do you have an symptoms or problems not managed by your medications? No.  Any concerns about your health right now? No.  Has your provider asked that you check blood pressure, blood sugar, or follow special diet at home? Yes, provider has asked to check blood sugar.  Do you get any type of exercise on a regular basis? No. Patient stated she would do some walking, but it has been cold lately.   Can you think of a goal you would like to reach for your health? Patient reports she just wants to be healthy.  Do you have any problems getting your medications? Patient reports she has no  problems getting her medications or with anything else.  Is there anything that you would like to discuss during the appointment? Patient reports no.  Please bring medications and supplements to appointment. Patient made aware.   Star Rating Drugs: Atorvastatin 80 MG tablet: 90 DS, last filled on 11/15/20. Losartan 25 MG tablet: 90 DS, last filled on 11/25/20. Ozempic 0.25 or 0.5 MG: 30 DS, last filled on 10/22/20.  Orlando Penner, CPP Notified.  Raynelle Highland, [place] Pharmacist Assistant [phone] CCM Total Time: 41 minutes

## 2020-12-12 ENCOUNTER — Encounter: Payer: Self-pay | Admitting: Cardiology

## 2020-12-12 ENCOUNTER — Ambulatory Visit (INDEPENDENT_AMBULATORY_CARE_PROVIDER_SITE_OTHER): Payer: Medicare Other

## 2020-12-12 DIAGNOSIS — I10 Essential (primary) hypertension: Secondary | ICD-10-CM | POA: Diagnosis not present

## 2020-12-12 DIAGNOSIS — Z794 Long term (current) use of insulin: Secondary | ICD-10-CM

## 2020-12-12 DIAGNOSIS — N1831 Chronic kidney disease, stage 3a: Secondary | ICD-10-CM

## 2020-12-12 DIAGNOSIS — E0822 Diabetes mellitus due to underlying condition with diabetic chronic kidney disease: Secondary | ICD-10-CM

## 2020-12-12 DIAGNOSIS — E7849 Other hyperlipidemia: Secondary | ICD-10-CM

## 2020-12-12 NOTE — Progress Notes (Signed)
Chronic Care Management Pharmacy Note  12/13/2020 Name:  Sarah Weaver MRN:  [id number] DOB:  11-04-1935  Subjective: Sarah Weaver is an 85 y.o. year old female who is a primary patient of [name], [place].  The CCM team was consulted for assistance with disease management and care coordination needs.  She is a widow, 3 grown children. Four of her grandchildren are married and she has 6 great grand kids. One of her great grand kids will finish college in 2022, and another will graduate in 2023.  She keeps a consistent schedule and she stays very busy. She has one son who is deaf who communicates very well and works at [employer], he is not a candidate for chemotherapy so he is only taking radiation. He is working everyday and has a good appetite. He has 32 more treatments left.   Engaged with patient by telephone for initial visit in response to provider referral for pharmacy case management and/or care coordination services.   Consent to Services:  The patient was given the following information about Chronic Care Management services today, agreed to services, and gave verbal consent: 1. CCM service includes personalized support from designated clinical staff supervised by the primary care provider, including individualized plan of care and coordination with other care providers 2. 24/7 contact phone numbers for assistance for urgent and routine care needs. 3. Service will only be billed when office clinical staff spend 20 minutes or more in a month to coordinate care. 4. Only one practitioner may furnish and bill the service in a calendar month. 5.The patient may stop CCM services at any time (effective at the end of the month) by phone call to the office staff. 6. The patient will be responsible for cost sharing (co-pay) of up to 20% of the service fee (after annual deductible is met). Patient agreed to services and consent obtained.  Patient Care Team: [name],  FNP as PCP - General (General Practice) Dorothy Spark, MD as PCP - Cardiology (Cardiology) Rex Kras Claudette Stapler, RN as [facility] Mayford Knife, [facility] (Pharmacist)  Recent office visits: 2/22/202 PCP [facility] visits: None in previous 6 months  Objective:  Lab Results  Component Value Date   CREATININE 1.60 (H) 11/06/2020   BUN 22 11/06/2020   GFRNONAA 30 (L) 11/06/2020   GFRAA 34 (L) 11/06/2020   NA 141 11/06/2020   K 4.4 11/06/2020   CALCIUM 9.6 11/06/2020   CO2 22 11/06/2020   GLUCOSE 146 (H) 11/06/2020    Lab Results  Component Value Date/Time   HGBA1C 7.3 (H) 11/06/2020 12:02 PM   HGBA1C 7.3 (H) 07/11/2020 11:31 AM    Last diabetic Eye exam:  Lab Results  Component Value Date/Time   HMDIABEYEEXA Retinopathy (A) 10/25/2020 12:00 AM    Last diabetic Foot exam: No results found for: HMDIABFOOTEX   Lab Results  Component Value Date   CHOL 130 11/06/2020   HDL 51 11/06/2020   LDLCALC 63 11/06/2020   TRIG 83 11/06/2020   CHOLHDL 2.5 11/06/2020    Hepatic Function Latest Ref Rng & Units 11/06/2020 07/11/2020 06/07/2020  Total Protein 6.0 - 8.5 g/dL 7.1 7.5 -  Albumin 3.6 - 4.6 g/dL 4.1 4.3 3.9  AST 0 - 40 IU/L 23 22 -  ALT 0 - 32 IU/L 21 23 -  Alk Phosphatase 44 - 121 IU/L 95 90 -  Total Bilirubin 0.0 - 1.2 mg/dL 0.5 0.2 -  Bilirubin,  Direct 0.00 - 0.40 mg/dL - - -    Lab Results  Component Value Date/Time   TSH 2.660 12/27/2019 01:10 PM   TSH 1.860 08/25/2019 03:41 PM   FREET4 1.32 05/05/2019 02:34 PM    CBC Latest Ref Rng & Units 07/11/2020 05/04/2020 02/16/2020  WBC 3.4 - 10.8 x10E3/uL 4.2 2.8(L) 3.7  Hemoglobin 11.1 - 15.9 g/dL 12.5 11.4(L) 12.1  Hematocrit 34.0 - 46.6 % 39.3 36.8 36.5  Platelets 150 - 450 x10E3/uL 204 157 211    No results found for: VD25OH  Clinical ASCVD: Yes  The ASCVD Risk score Mikey Bussing DC Jr., et al., 2013) failed to calculate for the following reasons:   The 2013 ASCVD risk score is only  valid for ages 16 to 49   The patient has a prior MI or stroke diagnosis    Depression screen [facility] 2/9 07/11/2020 08/25/2019 05/17/2019  Decreased Interest 0 0 0  Down, Depressed, Hopeless 0 0 0  PHQ - 2 Score 0 0 0  Altered sleeping 0 - -  Tired, decreased energy 0 - -  Change in appetite 0 - -  Feeling bad or failure about yourself  0 - -  Trouble concentrating 0 - -  Moving slowly or fidgety/restless 0 - -  Suicidal thoughts 0 - -  PHQ-9 Score 0 - -  Difficult doing work/chores Not difficult at all - -      Social History   Tobacco Use  Smoking Status Never Smoker  Smokeless Tobacco Never Used   BP Readings from Last 3 Encounters:  11/06/20 140/80  07/11/20 (!) 148/80  07/11/20 (!) 148/80   Pulse Readings from Last 3 Encounters:  11/06/20 77  07/11/20 86  07/11/20 86   Wt Readings from Last 3 Encounters:  11/06/20 297 lb 12.8 oz (135.1 kg)  07/11/20 300 lb (136.1 kg)  07/11/20 300 lb (136.1 kg)   BMI Readings from Last 3 Encounters:  11/06/20 49.86 kg/m  07/11/20 50.23 kg/m  07/11/20 50.23 kg/m    Assessment/Interventions: Review of patient past medical history, allergies, medications, health status, including review of consultants reports, laboratory and other test data, was performed as part of comprehensive evaluation and provision of chronic care management services.   SDOH:  (Social Determinants of Health) assessments and interventions performed: Yes  SDOH Screenings   Alcohol Screen: Not on file  Depression (PHQ2-9): Low Risk   . PHQ-2 Score: 0  Financial Resource Strain: Low Risk   . Difficulty of Paying Living Expenses: Not hard at all  Food Insecurity: No Food Insecurity  . Worried About Charity fundraiser in the Last Year: Never true  . Ran Out of Food in the Last Year: Never true  Housing: Not on file  Physical Activity: Inactive  . Days of Exercise per Week: 0 days  . Minutes of Exercise per Session: 0 min  Social Connections: Not on  file  Stress: No Stress Concern Present  . Feeling of Stress : Not at all  Tobacco Use: Low Risk   . Smoking Tobacco Use: Never Smoker  . Smokeless Tobacco Use: Never Used  Transportation Needs: No Transportation Needs  . Lack of Transportation (Medical): No  . Lack of Transportation (Non-Medical): No    CCM Care Plan  Allergies  Allergen Reactions  . Carvedilol Cough    Pt reports causes her to cough  . Lisinopril Cough    REPORTS CAUSES DRY COUGH    Medications Reviewed Today  Reviewed by Mayford Knife, RPH (Pharmacist) on 12/12/20 at 1318  Med List Status: <None>  Medication Order Taking? Sig Documenting Provider Last Dose Status Informant        Discontinued 05/04/20 1845 (Entry Error)   amLODipine (NORVASC) 10 MG tablet [id number] Yes TAKE 1 TABLET BY MOUTH EVERY DAY [name], FNP Taking Active   aspirin EC 81 MG tablet [id number] Yes Take 81 mg by mouth daily. [provider] Taking Active Self  atorvastatin (LIPITOR) 80 MG tablet [id number] Yes TAKE 1 TABLET BY MOUTH DAILY AT Lossie Faes, MD Taking Active   blood glucose meter kit and supplies KIT [id number] Yes Dispense based on patient and insurance preference. Use up to four times daily as directed. (FOR ICD-9 250.00, 250.01). [name], FNP Taking Active   brinzolamide (AZOPT) 1 % ophthalmic suspension [id number] Yes Place 1 drop into both eyes 3 (three) times daily. [provider] Taking Active Self  BYSTOLIC 2.5 MG tablet [id number] Yes TAKE 1 TABLET BY MOUTH EVERY DAY Dorothy Spark, MD Taking Active   Cholecalciferol 125 MCG (5000 UT) TABS [id number] Yes Take 1 tablet by mouth daily. [provider] Taking Active   ezetimibe (ZETIA) 10 MG tablet [id number] Yes Take 1 tablet (10 mg total) by mouth daily. Richardson Dopp T, PA-C Taking Active   furosemide (LASIX) 40 MG tablet [id number] Yes TAKE 1 TABLET BY MOUTH EVERY DAY Dorothy Spark, MD Taking Active   insulin  degludec (TRESIBA FLEXTOUCH) 100 UNIT/ML FlexTouch Pen [id number] Yes Inject 10 Units into the skin daily. [name], FNP Taking Active   Insulin Pen Needle (BD PEN NEEDLE NANO U/F) 32G X 4 MM MISC [id number] Yes USE NIGHTLY AS DIRECTED Rodriguez-Southworth, Sunday Spillers, PA-C Taking Active   Latanoprostene Bunod 0.024 % SOLN [id number] Yes Apply 1 drop to eye daily. [provider] Taking Active Self  levothyroxine (SYNTHROID) 50 MCG tablet [id number] Yes TAKE 1 TABLET BY MOUTH EVERY DAY BEFORE Evelena Peat, FNP Taking Active   LINZESS 145 MCG CAPS capsule [id number] Yes TAKE 1 CAPSULE (145 MCG TOTAL) BY MOUTH DAILY BEFORE BREAKFAST. FOR CONSTIPATION [name], FNP Taking Active   losartan (COZAAR) 25 MG tablet [id number] Yes TAKE 1 TABLET BY MOUTH EVERY DAY Dorothy Spark, MD Taking Active   Magnesium 250 MG TABS [id number] Yes Take 1 tablet (250 mg total) by mouth every evening. Take with evening meal [name], FNP Taking Active   nystatin (NYSTATIN) powder [id number] Yes Apply 1 application topically 3 (three) times daily. [name], FNP Taking Active   [facility] VERIO test strip [id number] Yes CHECK BLOOD SUGARS ONCE DAILY Rodriguez-Southworth, Sunday Spillers, PA-C Taking Active   OZEMPIC, 0.25 OR 0.5 MG/DOSE, 2 MG/1.5ML SOPN [id number] Yes INJECT 0.25 MG INTO THE SKIN ONCE A WEEK. [name], FNP Taking Active   traMADol-acetaminophen [facility]) 37.5-325 MG tablet [id number] Yes Take 1 tablet by mouth every 6 (six) hours as needed for moderate pain (prn back pain). [name], FNP Taking Active           Patient Active Problem List   Diagnosis Date Noted  . Class 3 severe obesity due to excess calories with serious comorbidity in adult ([place]) 08/25/2019  . Diabetes mellitus due to underlying condition with stage 3a chronic kidney disease, with long-term current use of insulin ([place]) 06/16/2019  . CAD (coronary artery disease) 07/26/2018  . Chronic diastolic CHF  (congestive heart failure) ([place]) 07/26/2018  . Chronic kidney disease (CKD), stage III (  moderate) ([place]) 07/26/2018  . Chest pain, moderate coronary artery risk 12/20/2015  . DM2 (diabetes mellitus, type 2) ([place]) 12/20/2015  . HTN (hypertension) 12/20/2015  . HLD (hyperlipidemia) 12/20/2015  . Ischemic chest pain ([place]) 12/20/2015  . NSTEMI (non-ST elevated myocardial infarction) ([place])   . Hypertensive heart disease without heart failure   . Acute renal failure ([place])   . Cholelithiasis 04/14/2011    Immunization History  Administered Date(s) Administered  . Fluad Quad(high Dose 65+) 07/11/2020  . Influenza, High Dose Seasonal PF 07/29/2017, 07/08/2018, 06/23/2019  . Influenza-Unspecified 06/15/2018  . PFIZER(Purple Top)SARS-COV-2 Vaccination 09/26/2020, 10/17/2020  . Pneumococcal Polysaccharide-23 04/24/2011, 07/29/2017  . Tdap 09/22/2018    Conditions to be addressed/monitored:  Hypertension, Hyperlipidemia and Diabetes  Care Plan : [place]  Updates made by Mayford Knife, RPH since 12/13/2020 12:00 AM    Problem: HTN, HLD, DM II   Priority: High    Long-Range Goal: Patient Stated   Start Date: 12/12/2020  This Visit's Progress: On track  Priority: High  Note:    Current Barriers:  . Does not adhere to prescribed medication regimen   Pharmacist Clinical Goal(s):  Marland Kitchen Patient will achieve adherence to monitoring guidelines and medication adherence to achieve therapeutic efficacy through collaboration with PharmD and provider.   Interventions: . 1:1 collaboration with [name], FNP regarding development and update of comprehensive plan of care as evidenced by provider attestation and co-signature . Inter-disciplinary care team collaboration (see longitudinal plan of care) . Comprehensive medication review performed; medication list updated in electronic medical record  Hypertension (BP goal <140/90) -Controlled -Current treatment: . Losartan 25 mg  tablet once per day o Per patient Dr. Joelyn Oms told her to take two tablets by mouth daily  . Bystolic 2.5 mg tablet once per day  -Current home readings: checking BP once a week, last reading at the doctors office -Current exercise habits: patient reports walking, she is walking in the parking lot once back and forth about twice per week.  -Denies hypotensive/hypertensive symptoms -Educated on BP goals and benefits of medications for prevention of heart attack, stroke and kidney damage; Daily salt intake goal < 2300 mg; Exercise goal of 150 minutes per week; Proper BP monitoring technique; -Counseled to monitor BP at home 3 times per week, document, and provide log at future appointments -Counseled on diet and exercise extensively Recommended to continue current medication  Hyperlipidemia: (LDL goal < 70) -Controlled -Current treatment: . Zetia 10 mg tablet once per day . Aspirin 81 mg tablet once per day   -Educated on Cholesterol goals;  Benefits of statin for ASCVD risk reduction; Importance of limiting foods high in cholesterol; -Counseled on diet and exercise extensively Recommended to continue current medication  Diabetes (A1c goal <8%) -Controlled -Current medications:    Ozempic 0.25 mg injection once a week on Tuesday  -Current home glucose readings . fasting glucose: 105 -Denies hypoglycemic/hyperglycemic symptoms -Current meal patterns:  . breakfast: boiled egg or scrambled egg with [place] bacon and sometimes cereal or toast- wheat bread, small bagel with cream cheese  . Lunch: [place], tomato or chicken sandwich  . dinner: spinach, green beans, kale, collard greens, cabbage, broccoli and cauliflower with chick  . snacks: greek yogurt (yoplait)  . drinks: water  -Educated on A1c and blood sugar goals; Benefits of weight loss; Benefits of routine self-monitoring of blood sugar; -Counseled to check feet daily and get yearly eye exams -Recommended to continue  current medication   Patient Goals/Self-Care  Activities . Patient will:  - take medications as prescribed  Follow Up Plan: The patient has been provided with contact information for the care management team and has been advised to call with any health related questions or concerns.       Medication Assistance: None required.  Patient affirms current coverage meets needs.  Patient's preferred pharmacy is:  [id number] [place] [place]Phone: [phone]: [phone] [facility] [place] [place]Phone: [phone]: [phone] Uses pill box? Yes Pt endorses 80% compliance  We discussed: Benefits of medication synchronization, packaging and delivery as well as enhanced pharmacist oversight with [employer]. Patient decided to: Continue current medication management strategy  Care Plan and Follow Up Patient Decision:  Patient agrees to Care Plan and Follow-up.  Plan: The patient has been provided with contact information for the care management team and has been advised to call with any health related questions or concerns.   VOrlando Penner PharmD Clinical Pharmacist [facility] [phone]

## 2020-12-13 NOTE — Patient Instructions (Signed)
Visit Information It was great speaking with you today!  Please let me know if you have any questions about our visit.  Goals Addressed            This Visit's Progress   . Manage My Medicine       Timeframe:  Long-Range Goal Priority:  High Start Date:                             Expected End Date:                       Follow Up Date: 03/06/2021    - call for medicine refill 2 or 3 days before it runs out - call if I am sick and can't take my medicine - keep a list of all the medicines I take; vitamins and herbals too - use a pillbox to sort medicine - use an alarm clock or phone to remind me to take my medicine    Why is this important?   . These steps will help you keep on track with your medicines.          Patient Care Plan: CCM Pharmacy Care Plan    Problem Identified: HTN, HLD, DM II   Priority: High    Long-Range Goal: Patient Stated   Start Date: 12/12/2020  This Visit's Progress: On track  Priority: High  Note:    Current Barriers:  . Does not adhere to prescribed medication regimen   Pharmacist Clinical Goal(s):  Marland Kitchen Patient will achieve adherence to monitoring guidelines and medication adherence to achieve therapeutic efficacy through collaboration with PharmD and provider.   Interventions: . 1:1 collaboration with [name], FNP regarding development and update of comprehensive plan of care as evidenced by provider attestation and co-signature . Inter-disciplinary care team collaboration (see longitudinal plan of care) . Comprehensive medication review performed; medication list updated in electronic medical record  Hypertension (BP goal <140/90) -Controlled -Current treatment: . Losartan 25 mg tablet once per day o Per patient Dr. Marisue Humble told her to take two tablets by mouth daily  . Bystolic 2.5 mg tablet once per day  -Current home readings: checking BP once a week, last reading at the doctors office -Current exercise habits: patient  reports walking, she is walking in the parking lot once back and forth about twice per week.  -Denies hypotensive/hypertensive symptoms -Educated on BP goals and benefits of medications for prevention of heart attack, stroke and kidney damage; Daily salt intake goal < 2300 mg; Exercise goal of 150 minutes per week; Proper BP monitoring technique; -Counseled to monitor BP at home 3 times per week, document, and provide log at future appointments -Counseled on diet and exercise extensively Recommended to continue current medication  Hyperlipidemia: (LDL goal < 70) -Controlled -Current treatment: . Zetia 10 mg tablet once per day . Aspirin 81 mg tablet once per day   -Educated on Cholesterol goals;  Benefits of statin for ASCVD risk reduction; Importance of limiting foods high in cholesterol; -Counseled on diet and exercise extensively Recommended to continue current medication  Diabetes (A1c goal <8%) -Controlled -Current medications:    Ozempic 0.25 mg injection once a week on Tuesday  -Current home glucose readings . fasting glucose: 105 -Denies hypoglycemic/hyperglycemic symptoms -Current meal patterns:  . breakfast: boiled egg or scrambled egg with [place] bacon and sometimes cereal or toast- wheat bread, small bagel with cream cheese  .  Lunch: [place], tomato or chicken sandwich  . dinner: spinach, green beans, kale, collard greens, cabbage, broccoli and cauliflower with chick  . snacks: greek yogurt (yoplait)  . drinks: water  -Educated on A1c and blood sugar goals; Benefits of weight loss; Benefits of routine self-monitoring of blood sugar; -Counseled to check feet daily and get yearly eye exams -Recommended to continue current medication   Patient Goals/Self-Care Activities . Patient will:  - take medications as prescribed  Follow Up Plan: The patient has been provided with contact information for the care management team and has been advised to call with any health  related questions or concerns.       Ms. Mcgilvray was given information about Chronic Care Management services today including:  1. CCM service includes personalized support from designated clinical staff supervised by her physician, including individualized plan of care and coordination with other care providers 2. 24/7 contact phone numbers for assistance for urgent and routine care needs. 3. Standard insurance, coinsurance, copays and deductibles apply for chronic care management only during months in which we provide at least 20 minutes of these services. Most insurances cover these services at 100%, however patients may be responsible for any copay, coinsurance and/or deductible if applicable. This service may help you avoid the need for more expensive face-to-face services. 4. Only one practitioner may furnish and bill the service in a calendar month. 5. The patient may stop CCM services at any time (effective at the end of the month) by phone call to the office staff.  Patient agreed to services and verbal consent obtained.   The patient verbalized understanding of instructions, educational materials, and care plan provided today and agreed to receive a mailed copy of patient instructions, educational materials, and care plan.   Cherylin Mylar, PharmD Clinical Pharmacist [facility] [phone]

## 2020-12-17 ENCOUNTER — Other Ambulatory Visit: Payer: Self-pay | Admitting: Physician Assistant

## 2020-12-21 ENCOUNTER — Ambulatory Visit (INDEPENDENT_AMBULATORY_CARE_PROVIDER_SITE_OTHER): Payer: Medicare Other

## 2020-12-21 ENCOUNTER — Telehealth: Payer: Medicare Other

## 2020-12-21 DIAGNOSIS — N1831 Chronic kidney disease, stage 3a: Secondary | ICD-10-CM

## 2020-12-21 DIAGNOSIS — I10 Essential (primary) hypertension: Secondary | ICD-10-CM | POA: Diagnosis not present

## 2020-12-21 DIAGNOSIS — E782 Mixed hyperlipidemia: Secondary | ICD-10-CM

## 2020-12-21 DIAGNOSIS — E0822 Diabetes mellitus due to underlying condition with diabetic chronic kidney disease: Secondary | ICD-10-CM

## 2020-12-21 DIAGNOSIS — Z794 Long term (current) use of insulin: Secondary | ICD-10-CM

## 2020-12-31 NOTE — Patient Instructions (Signed)
Goals Addressed    . Monitor and Manage My Blood Sugar-Diabetes Type 2   On track    Timeframe:  Long-Range Goal Priority:  High Start Date:  12/21/20                           Expected End Date: 06/21/21                      Follow Up Date: 03/12/21    - check blood sugar at prescribed times - check blood sugar before and after exercise - check blood sugar if I feel it is too high or too low - enter blood sugar readings and medication or insulin into daily log - take the blood sugar log to all doctor visits - take the blood sugar meter to all doctor visits    Why is this important?    Checking your blood sugar at home helps to keep it from getting very high or very low.   Writing the results in a diary or log helps the doctor know how to care for you.   Your blood sugar log should have the time, date and the results.   Also, write down the amount of insulin or other medicine that you take.   Other information, like what you ate, exercise done and how you were feeling, will also be helpful.     Notes:     . Obtain Eye Exam-Diabetes Type 2   On track    Timeframe:  Long-Range Goal Priority:  Medium Start Date: 12/21/20                             Expected End Date: 06/21/21                     Follow Up Date: 03/12/21    - keep appointment with eye doctor - schedule appointment with eye doctor    Why is this important?    Eye check-ups are important when you have diabetes.   Vision loss can be prevented.    Notes:     . Perform Foot Care-Diabetes Type 2   On track    Timeframe:  Long-Range Goal Priority:  Medium Start Date:  12/21/20                           Expected End Date:  06/21/21                     Follow Up Date: 03/12/21    - check feet daily for cuts, sores or redness - keep feet up while sitting - trim toenails straight across - wash and dry feet carefully every day - wear comfortable, cotton socks - wear comfortable, well-fitting shoes    Why is this  important?    Good foot care is very important when you have diabetes.   There are many things you can do to keep your feet healthy and catch a problem early.    Notes:     . Track and Manage My Blood Pressure-Hypertension   On track    Timeframe:  Long-Range Goal Priority:  High Start Date: 12/21/20  Expected End Date: 06/21/21                      Follow Up Date: 03/12/21   Self-Care Activities: - Self administers medications as prescribed Attends all scheduled provider appointments Calls provider office for new concerns, questions, or BP outside discussed parameters Checks BP and records as discussed Follows a low sodium diet/DASH diet Patient Goals: - check blood pressure 3 times per week - choose a place to take my blood pressure (home, clinic or office, retail store) - write blood pressure results in a log or diary - learn about high blood pressure/review printed patient educational materials related to High Blood Pressure    Why is this important?    You won't feel high blood pressure, but it can still hurt your blood vessels.   High blood pressure can cause heart or kidney problems. It can also cause a stroke.   Making lifestyle changes like losing a Stasia Somero weight or eating less salt will help.   Checking your blood pressure at home and at different times of the day can help to control blood pressure.   If the doctor prescribes medicine remember to take it the way the doctor ordered.   Call the office if you cannot afford the medicine or if there are questions about it.     Notes:

## 2020-12-31 NOTE — Chronic Care Management (AMB) (Signed)
Chronic Care Management   CCM RN Visit Note  12/21/2020 Name: Sarah Weaver MRN: [id number] DOB: 1935-11-09  Subjective: Sarah Weaver is a 85 y.o. year old female who is a primary care patient of [name], [place]. The care management team was consulted for assistance with disease management and care coordination needs.    Engaged with patient by telephone for initial visit in response to provider referral for case management and/or care coordination services.   Consent to Services:  The patient was given information about Chronic Care Management services, agreed to services, and gave verbal consent prior to initiation of services.  Please see initial visit note for detailed documentation.   Patient agreed to services and verbal consent obtained.   Assessment: Review of patient past medical history, allergies, medications, health status, including review of consultants reports, laboratory and other test data, was performed as part of comprehensive evaluation and provision of chronic care management services.   SDOH (Social Determinants of Health) assessments and interventions performed:  Yes, no acute needs identified  CCM Care Plan  Allergies  Allergen Reactions  . Carvedilol Cough    Pt reports causes her to cough  . Lisinopril Cough    REPORTS CAUSES DRY COUGH    Outpatient Encounter Medications as of 12/21/2020  Medication Sig  . losartan (COZAAR) 50 MG tablet Take 50 mg by mouth at bedtime.  Marland Kitchen amLODipine (NORVASC) 10 MG tablet TAKE 1 TABLET BY MOUTH EVERY DAY  . aspirin EC 81 MG tablet Take 81 mg by mouth daily.  Marland Kitchen atorvastatin (LIPITOR) 80 MG tablet TAKE 1 TABLET BY MOUTH DAILY AT 6PM  . blood glucose meter kit and supplies KIT Dispense based on patient and insurance preference. Use up to four times daily as directed. (FOR ICD-9 250.00, 250.01).  Marland Kitchen brinzolamide (AZOPT) 1 % ophthalmic suspension Place 1 drop into both eyes 3 (three) times daily.   Marland Kitchen BYSTOLIC 2.5 MG tablet TAKE 1 TABLET BY MOUTH EVERY DAY  . Cholecalciferol 125 MCG (5000 UT) TABS Take 1 tablet by mouth daily.  Marland Kitchen ezetimibe (ZETIA) 10 MG tablet TAKE 1 TABLET BY MOUTH EVERY DAY  . furosemide (LASIX) 40 MG tablet TAKE 1 TABLET BY MOUTH EVERY DAY  . insulin degludec (TRESIBA FLEXTOUCH) 100 UNIT/ML FlexTouch Pen Inject 10 Units into the skin daily.  . Insulin Pen Needle (BD PEN NEEDLE NANO U/F) 32G X 4 MM MISC USE NIGHTLY AS DIRECTED  . Latanoprostene Bunod 0.024 % SOLN Apply 1 drop to eye daily.  Marland Kitchen levothyroxine (SYNTHROID) 50 MCG tablet TAKE 1 TABLET BY MOUTH EVERY DAY BEFORE BREAKFAST  . LINZESS 145 MCG CAPS capsule TAKE 1 CAPSULE (145 MCG TOTAL) BY MOUTH DAILY BEFORE BREAKFAST. FOR CONSTIPATION  . Magnesium 250 MG TABS Take 1 tablet (250 mg total) by mouth every evening. Take with evening meal  . nystatin (NYSTATIN) powder Apply 1 application topically 3 (three) times daily.  Glory Rosebush VERIO test strip CHECK BLOOD SUGARS ONCE DAILY  . OZEMPIC, 0.25 OR 0.5 MG/DOSE, 2 MG/1.5ML SOPN INJECT 0.25 MG INTO THE SKIN ONCE A WEEK.  . traMADol-acetaminophen (ULTRACET) 37.5-325 MG tablet Take 1 tablet by mouth every 6 (six) hours as needed for moderate pain (prn back pain).  . [DISCONTINUED] albuterol (VENTOLIN HFA) 108 (90 Base) MCG/ACT inhaler Inhale 1-2 puffs into the lungs every 6 (six) hours as needed for wheezing or shortness of breath.  . [DISCONTINUED] losartan (COZAAR) 25 MG tablet TAKE 1 TABLET BY MOUTH EVERY DAY  No facility-administered encounter medications on file as of 12/21/2020.    Patient Active Problem List   Diagnosis Date Noted  . Class 3 severe obesity due to excess calories with serious comorbidity in adult ([place]) 08/25/2019  . Diabetes mellitus due to underlying condition with stage 3a chronic kidney disease, with long-term current use of insulin ([place]) 06/16/2019  . CAD (coronary artery disease) 07/26/2018  . Chronic diastolic CHF (congestive heart failure)  ([place]) 07/26/2018  . Chronic kidney disease (CKD), stage III (moderate) ([facility]) 07/26/2018  . Chest pain, moderate coronary artery risk 12/20/2015  . DM2 (diabetes mellitus, type 2) ([place]) 12/20/2015  . HTN (hypertension) 12/20/2015  . HLD (hyperlipidemia) 12/20/2015  . Ischemic chest pain ([place]) 12/20/2015  . NSTEMI (non-ST elevated myocardial infarction) ([place])   . Hypertensive heart disease without heart failure   . Acute renal failure ([place])   . Cholelithiasis 04/14/2011    Conditions to be addressed/monitored:HTN, HLD, DM II, Stage 3a Chronic Kidney disease  Care Plan : Diabetes Type 2 (Adult)  Updates made by Lynne Logan, RN since 12/31/2020 12:00 AM    Problem: Glycemic Management (Diabetes, Type 2)   Priority: High    Long-Range Goal: Glycemic Management Optimized   Start Date: 12/21/2020  Expected End Date: 06/21/2021  This Visit's Progress: On track  Priority: High  Note:   Objective:  Lab Results  Component Value Date   HGBA1C 7.3 (H) 11/06/2020 .   Lab Results  Component Value Date   CREATININE 1.60 (H) 11/06/2020   CREATININE 1.31 (H) 07/11/2020   CREATININE 1.2 (A) 06/07/2020 .   Marland Kitchen No results found for: EGFR Current Barriers:  Marland Kitchen Knowledge Deficits related to basic Diabetes pathophysiology and self care/management . Knowledge Deficits related to medications used for management of diabetes Case Manager Clinical Goal(s):  . patient will demonstrate improved adherence to prescribed treatment plan for diabetes self care/management as evidenced by: daily monitoring and recording of CBG  adherence to ADA/ carb modified diet exercise 5 days/week adherence to prescribed medication regimen contacting provider for new or worsened symptoms or questions Interventions:  . Collaboration with [name], FNP regarding development and update of comprehensive plan of care as evidenced by provider attestation and co-signature . Inter-disciplinary care team collaboration (see  longitudinal plan of care) . Provided education to patient about basic DM disease process . Review of patient status, including review of consultants reports, relevant laboratory and other test results, and medications completed. . Reviewed medications with patient and discussed importance of medication adherence . Provided patient with written educational materials related to hypo and hyperglycemia and importance of correct treatment . Advised patient, providing education and rationale, to check cbg daily before meals and at bedtime  and record, calling PCP and or CCM team  for findings outside established parameters.   . Discussed plans with patient for ongoing care management follow up and provided patient with direct contact information for care management team Self-Care Activities - Self administers oral medications as prescribed Self administers insulin as prescribed Self administers injectable DM medication (Ozempic) as prescribed Attends all scheduled provider appointments Checks blood sugars as prescribed and utilize hyper and hypoglycemia protocol as needed Adheres to prescribed ADA/carb modified Patient Goals: - check blood sugar at prescribed times - check blood sugar before and after exercise - check blood sugar if I feel it is too high or too low - enter blood sugar readings and medication or insulin into daily log - take the blood sugar log  to all doctor visits - take the blood sugar meter to all doctor visits - keep appointment with eye doctor - schedule appointment with eye doctor - check feet daily for cuts, sores or redness - keep feet up while sitting - trim toenails straight across - wash and dry feet carefully every day - wear comfortable, cotton socks - wear comfortable, well-fitting shoes Follow Up Plan: Telephone follow up appointment with care management team member scheduled for: 03/12/21   Care Plan : Hypertension (Adult)  Updates made by Lynne Logan, RN  since 12/31/2020 12:00 AM    Problem: Disease Progression (Hypertension)   Priority: High    Long-Range Goal: Disease Progression Prevented or Minimized   Start Date: 12/21/2020  Expected End Date: 06/21/2021  This Visit's Progress: On track  Priority: High  Note:   Objective:  . Last practice recorded BP readings:  BP Readings from Last 3 Encounters:  11/06/20 140/80  07/11/20 (!) 148/80  07/11/20 (!) 148/80 .   Marland Kitchen Most recent eGFR/CrCl: No results found for: EGFR  No components found for: CRCL Current Barriers:  Marland Kitchen Knowledge Deficits related to basic understanding of hypertension pathophysiology and self care management . Knowledge Deficits related to understanding of medications prescribed for management of hypertension Case Manager Clinical Goal(s):  . patient will demonstrate improved adherence to prescribed treatment plan for hypertension as evidenced by taking all medications as prescribed, monitoring and recording blood pressure as directed, adhering to low sodium/DASH diet Interventions:  . Collaboration with [name], FNP regarding development and update of comprehensive plan of care as evidenced by provider attestation and co-signature . Inter-disciplinary care team collaboration (see longitudinal plan of care) . Evaluation of current treatment plan related to hypertension self management and patient's adherence to plan as established by provider. . Provided education to patient re: stroke prevention, s/s of heart attack and stroke, DASH diet, complications of uncontrolled blood pressure . Reviewed medications with patient and discussed importance of compliance . Advised patient, providing education and rationale, to monitor blood pressure daily and record, calling PCP for findings outside established parameters.  . Discussed plans with patient for ongoing care management follow up and provided patient with direct contact information for care management team Self-Care  Activities: - Self administers medications as prescribed Attends all scheduled provider appointments Calls provider office for new concerns, questions, or BP outside discussed parameters Checks BP and records as discussed Follows a low sodium diet/DASH diet Patient Goals: - check blood pressure 3 times per week - choose a place to take my blood pressure (home, clinic or office, retail store) - write blood pressure results in a log or diary - learn about high blood pressure/review printed patient educational materials related to High Blood Pressure   Follow Up Plan: Telephone follow up appointment with care management team member scheduled for: 03/12/21   Care Plan : Chronic Kidney Disease  Updates made by Lynne Logan, RN since 12/31/2020 12:00 AM    Problem: Chronic Kidney Disease   Priority: High    Long-Range Goal: Chronic Kidney Disease progression minimized or avoided   Start Date: 12/21/2020  Expected End Date: 06/21/2021  This Visit's Progress: On track  Priority: High  Note:   Current Barriers:   Ineffective Self Health Maintenance  Clinical Goal(s):  Marland Kitchen Collaboration with [name], FNP regarding development and update of comprehensive plan of care as evidenced by provider attestation and co-signature . Inter-disciplinary care team collaboration (see longitudinal plan of care)  patient  will work with care management team to address care coordination and chronic disease management needs related to Disease Management  Educational Needs  Care Coordination  Medication Management and Education  Psychosocial Support   Interventions:   Evaluation of current treatment plan related to CKD Stage III , self-management and patient's adherence to plan as established by provider.  Collaboration with [name], FNP regarding development and update of comprehensive plan of care as evidenced by provider attestation       and co-signature  Inter-disciplinary care team  collaboration (see longitudinal plan of care) . Provided education to patient about basic disease process related to CKD . Review of patient status, including review of consultants reports, relevant laboratory and other test results, and medications completed. . Reviewed medications with patient and discussed importance of medication adherence . Mailed printed educational materials related to Stages of Chronic Kidney disease  Discussed plans with patient for ongoing care management follow up and provided patient with direct contact information for care management team Self Care Activities:  . Continue to adhere to MD recommendations for CKD  . Continue to keep all scheduled follow up appointments . Take medications as directed  . Let your healthcare team know if you are unable to take your medications . Call your pharmacy for refills at least 7 days prior to running out of medication . Increase water intake to 64 oz daily unless otherwise directed  Patient Goals: -maintain or improve adequate kidney function  Follow Up Plan: Telephone follow up appointment with care management team member scheduled for: 03/12/21    Plan:Telephone follow up appointment with care management team member scheduled for:  03/12/21  Barb Merino, RN, BSN, CCM Care Management Coordinator [place] [facility]  Direct Phone: [phone]

## 2021-01-10 ENCOUNTER — Ambulatory Visit: Payer: Medicare Other

## 2021-01-10 DIAGNOSIS — E0822 Diabetes mellitus due to underlying condition with diabetic chronic kidney disease: Secondary | ICD-10-CM

## 2021-01-10 DIAGNOSIS — Z794 Long term (current) use of insulin: Secondary | ICD-10-CM

## 2021-01-10 DIAGNOSIS — I10 Essential (primary) hypertension: Secondary | ICD-10-CM

## 2021-01-10 NOTE — Chronic Care Management (AMB) (Signed)
Chronic Care Management    Social Work Note  01/10/2021 Name: Sarah Weaver MRN: [id number] DOB: [birth year]  Sarah Weaver is a 85 y.o. year old female who is a primary care patient of [name], [place]. The CCM team was consulted to assist the patient with chronic disease management and/or care coordination needs related to: [facility] Screen.   Engaged with patient by telephone for initial visit in response to provider referral for social work chronic care management and care coordination services.   Consent to Services:  The patient was given the following information about Chronic Care Management services today, agreed to services, and gave verbal consent: 1. CCM service includes personalized support from designated clinical staff supervised by the primary care provider, including individualized plan of care and coordination with other care providers 2. 24/7 contact phone numbers for assistance for urgent and routine care needs. 3. Service will only be billed when office clinical staff spend 20 minutes or more in a month to coordinate care. 4. Only one practitioner may furnish and bill the service in a calendar month. 5.The patient may stop CCM services at any time (effective at the end of the month) by phone call to the office staff. 6. The patient will be responsible for cost sharing (co-pay) of up to 20% of the service fee (after annual deductible is met). Patient agreed to services and consent obtained.  Patient agreed to services and consent obtained.   Assessment: Review of patient past medical history, allergies, medications, and health status, including review of relevant consultants reports was performed today as part of a comprehensive evaluation and provision of chronic care management and care coordination services.     SDOH (Social Determinants of Health) assessments and interventions performed:  SDOH Interventions   Flowsheet Row Most Recent Value  SDOH  Interventions   Food Insecurity Interventions Intervention Not Indicated  Housing Interventions Intervention Not Indicated  Transportation Interventions Intervention Not Indicated       Advanced Directives Status: Not addressed in this encounter.  CCM Care Plan  Allergies  Allergen Reactions  . Carvedilol Cough    Pt reports causes her to cough  . Lisinopril Cough    REPORTS CAUSES DRY COUGH    Outpatient Encounter Medications as of 01/10/2021  Medication Sig  . amLODipine (NORVASC) 10 MG tablet TAKE 1 TABLET BY MOUTH EVERY DAY  . aspirin EC 81 MG tablet Take 81 mg by mouth daily.  Marland Kitchen atorvastatin (LIPITOR) 80 MG tablet TAKE 1 TABLET BY MOUTH DAILY AT 6PM  . blood glucose meter kit and supplies KIT Dispense based on patient and insurance preference. Use up to four times daily as directed. (FOR ICD-9 250.00, 250.01).  Marland Kitchen brinzolamide (AZOPT) 1 % ophthalmic suspension Place 1 drop into both eyes 3 (three) times daily.  Marland Kitchen BYSTOLIC 2.5 MG tablet TAKE 1 TABLET BY MOUTH EVERY DAY  . Cholecalciferol 125 MCG (5000 UT) TABS Take 1 tablet by mouth daily.  Marland Kitchen ezetimibe (ZETIA) 10 MG tablet TAKE 1 TABLET BY MOUTH EVERY DAY  . furosemide (LASIX) 40 MG tablet TAKE 1 TABLET BY MOUTH EVERY DAY  . insulin degludec (TRESIBA FLEXTOUCH) 100 UNIT/ML FlexTouch Pen Inject 10 Units into the skin daily.  . Insulin Pen Needle (BD PEN NEEDLE NANO U/F) 32G X 4 MM MISC USE NIGHTLY AS DIRECTED  . Latanoprostene Bunod 0.024 % SOLN Apply 1 drop to eye daily.  Marland Kitchen levothyroxine (SYNTHROID) 50 MCG tablet TAKE 1 TABLET BY MOUTH EVERY DAY BEFORE  BREAKFAST  . LINZESS 145 MCG CAPS capsule TAKE 1 CAPSULE (145 MCG TOTAL) BY MOUTH DAILY BEFORE BREAKFAST. FOR CONSTIPATION  . losartan (COZAAR) 50 MG tablet Take 50 mg by mouth at bedtime.  . Magnesium 250 MG TABS Take 1 tablet (250 mg total) by mouth every evening. Take with evening meal  . nystatin (NYSTATIN) powder Apply 1 application topically 3 (three) times daily.  Glory Rosebush VERIO test strip CHECK BLOOD SUGARS ONCE DAILY  . OZEMPIC, 0.25 OR 0.5 MG/DOSE, 2 MG/1.5ML SOPN INJECT 0.25 MG INTO THE SKIN ONCE A WEEK.  . traMADol-acetaminophen (ULTRACET) 37.5-325 MG tablet Take 1 tablet by mouth every 6 (six) hours as needed for moderate pain (prn back pain).  . [DISCONTINUED] albuterol (VENTOLIN HFA) 108 (90 Base) MCG/ACT inhaler Inhale 1-2 puffs into the lungs every 6 (six) hours as needed for wheezing or shortness of breath.   No facility-administered encounter medications on file as of 01/10/2021.    Patient Active Problem List   Diagnosis Date Noted  . Class 3 severe obesity due to excess calories with serious comorbidity in adult ([place]) 08/25/2019  . Diabetes mellitus due to underlying condition with stage 3a chronic kidney disease, with long-term current use of insulin ([place]) 06/16/2019  . CAD (coronary artery disease) 07/26/2018  . Chronic diastolic CHF (congestive heart failure) ([place]) 07/26/2018  . Chronic kidney disease (CKD), stage III (moderate) ([facility]) 07/26/2018  . Chest pain, moderate coronary artery risk 12/20/2015  . DM2 (diabetes mellitus, type 2) ([place]) 12/20/2015  . HTN (hypertension) 12/20/2015  . HLD (hyperlipidemia) 12/20/2015  . Ischemic chest pain ([place]) 12/20/2015  . NSTEMI (non-ST elevated myocardial infarction) ([place])   . Hypertensive heart disease without heart failure   . Acute renal failure ([place])   . Cholelithiasis 04/14/2011    Conditions to be addressed/monitored: HTN and DMII    Follow Up Plan: No SW follow up planned at this time as the patient denies acute resource needs. The patient will remain engaged with RN Care Manager and is encouraged to contact SW as needed.     Daneen Schick, BSW, CDP Social Worker, Certified Dementia Practitioner [place] [facility] [place] [facility] [phone]

## 2021-01-12 ENCOUNTER — Other Ambulatory Visit: Payer: Self-pay | Admitting: Nurse Practitioner

## 2021-01-17 ENCOUNTER — Ambulatory Visit: Payer: Medicare Other | Admitting: Cardiology

## 2021-02-04 ENCOUNTER — Other Ambulatory Visit: Payer: Self-pay | Admitting: Nurse Practitioner

## 2021-02-08 ENCOUNTER — Ambulatory Visit: Payer: Medicare Other | Admitting: Cardiology

## 2021-02-10 NOTE — Progress Notes (Addendum)
Cardiology Office Note:    Date:  02/12/2021   ID:  [name], DOB 06-Jan-1936, MRN [id number]  PCP:  [name], [facility] Cardiologist:  Ena Dawley, MD (Inactive) {   Referring MD: [name], FNP     History of Present Illness:    Kourtnee Lahey is a 85 y.o. female with a hx of CAD s/p DES to LAD in 12/2015, HTN, DDMII, HLD, morbid obesity, CKD III, and chronic diastolic heart failure who was previously followed by Dr. Meda Coffee who now returns to clinic for follow-up for her CAD and diastolic heart failure.   Per review of the record, last cath was 2017 which showed LAD ostial 25, mid 99; D2 ostial 40 LCx proximal 40 s/p PCI with 2.5 x 20 mm Synergy DES to the mid LAD. TTE 12/2019 EF 60-65, no RWMA, mild LVH, Gr 1 DD, trivial MR, AoV sclerosis (no AS). Cardiac monitor 12/29/19 showed sinus bradycardia to sinus rhythm the entire monitoring time, first-degree AV block and intermittent nocturnal Wenckebach episodes, rare PACs and PVCs, no significant arrhythmias.  She last saw Dr. Acie Fredrickson in clinic on 04/23/20 for an urgent visit for chest pain and dyspnea on exertion. Chest pain was very atypical in nature and worsened by lying on one side. Deemed to be MSK.   Today, the patient states that she is overall feeling well. Chest pain has resolved. Has chronic dyspnea on exertion which is unchanged for her. Has pedal edema that is mild. No orthopnea, PND, lightheadedness or dizziness. Blood pressure 130s/60s at home. Tolerating all medications as prescribed.   Past Medical History:  Diagnosis Date  . Arthritis    "all over"  . Chronic diastolic CHF (congestive heart failure) (HCC)    Echocardiogram 12/2019: EF 60-65, no RWMA, mild LVH, Gr 1 DD, trivial MR, mild AoV sclerosis, no AS  . Chronic lower back pain   . CKD (chronic kidney disease), stage III ([place])   . Coronary artery disease    a. status post DES to the LAD 12/2015.  .  Glaucoma   . Glaucoma of both eyes   . Hyperkalemia   . Hyperlipidemia   . Hypertension   . Type II diabetes mellitus ([place])     Past Surgical History:  Procedure Laterality Date  . CARDIAC CATHETERIZATION N/A 12/20/2015   Procedure: Left Heart Cath and Coronary Angiography;  Surgeon: Lorretta Harp, MD;  Location: [facility];  Service: Cardiovascular;  Laterality: N/A;  . CARDIAC CATHETERIZATION N/A 12/20/2015   Procedure: Coronary Stent Intervention;  Surgeon: Lorretta Harp, MD;  Location: [facility];  Service: Cardiovascular;  Laterality: N/A;  mid LAD 2.5 x 20 Synergy  . CATARACT EXTRACTION W/ INTRAOCULAR LENS  IMPLANT, BILATERAL Bilateral   . CORONARY ANGIOPLASTY WITH STENT PLACEMENT  12/20/2015  . LAPAROSCOPIC CHOLECYSTECTOMY  04/23/11  . TONSILLECTOMY      Current Medications: Current Meds  Medication Sig  . Accu-Chek Softclix Lancets lancets USE UP TO 4 TIMES DAILY AS DIRECTED  . amLODipine (NORVASC) 10 MG tablet TAKE 1 TABLET BY MOUTH EVERY DAY  . aspirin EC 81 MG tablet Take 81 mg by mouth daily.  Marland Kitchen atorvastatin (LIPITOR) 80 MG tablet TAKE 1 TABLET BY MOUTH DAILY AT 6PM  . blood glucose meter kit and supplies KIT Dispense based on patient and insurance preference. Use up to four times daily as directed. (FOR ICD-9 250.00, 250.01).  Marland Kitchen brinzolamide (AZOPT) 1 %  ophthalmic suspension Place 1 drop into both eyes 3 (three) times daily.  . Cholecalciferol 125 MCG (5000 UT) TABS Take 1 tablet by mouth daily.  . empagliflozin (JARDIANCE) 10 MG TABS tablet Take 1 tablet (10 mg total) by mouth daily before breakfast.  . ezetimibe (ZETIA) 10 MG tablet TAKE 1 TABLET BY MOUTH EVERY DAY  . furosemide (LASIX) 40 MG tablet TAKE 1 TABLET BY MOUTH EVERY DAY  . insulin degludec (TRESIBA FLEXTOUCH) 100 UNIT/ML FlexTouch Pen Inject 10 Units into the skin daily.  . Insulin Pen Needle (BD PEN NEEDLE NANO U/F) 32G X 4 MM MISC USE NIGHTLY AS DIRECTED  . Latanoprostene Bunod 0.024 %  SOLN Apply 1 drop to eye daily.  Marland Kitchen levothyroxine (SYNTHROID) 50 MCG tablet TAKE 1 TABLET BY MOUTH EVERY DAY BEFORE BREAKFAST  . LINZESS 145 MCG CAPS capsule TAKE 1 CAPSULE (145 MCG TOTAL) BY MOUTH DAILY BEFORE BREAKFAST. FOR CONSTIPATION  . losartan (COZAAR) 50 MG tablet Take 50 mg by mouth at bedtime.  . Magnesium 250 MG TABS Take 1 tablet (250 mg total) by mouth every evening. Take with evening meal  . nebivolol (BYSTOLIC) 5 MG tablet Take 1 tablet (5 mg total) by mouth daily.  Marland Kitchen nystatin (NYSTATIN) powder Apply 1 application topically 3 (three) times daily.  Glory Rosebush VERIO test strip CHECK BLOOD SUGARS ONCE DAILY  . OZEMPIC, 0.25 OR 0.5 MG/DOSE, 2 MG/1.5ML SOPN INJECT 0.25 MG INTO THE SKIN ONCE A WEEK.  . traMADol-acetaminophen (ULTRACET) 37.5-325 MG tablet Take 1 tablet by mouth every 6 (six) hours as needed for moderate pain (prn back pain).  . [DISCONTINUED] BYSTOLIC 2.5 MG tablet TAKE 1 TABLET BY MOUTH EVERY DAY     Allergies:   Carvedilol and Lisinopril   Social History   Socioeconomic History  . Marital status: Divorced    Spouse name: Not on file  . Number of children: Not on file  . Years of education: Not on file  . Highest education level: Not on file  Occupational History  . Occupation: retired  Tobacco Use  . Smoking status: Never Smoker  . Smokeless tobacco: Never Used  Vaping Use  . Vaping Use: Never used  Substance and Sexual Activity  . Alcohol use: No  . Drug use: No  . Sexual activity: Not Currently  Other Topics Concern  . Not on file  Social History Narrative  . Not on file   Social Determinants of Health   Financial Resource Strain: Low Risk   . Difficulty of Paying Living Expenses: Not hard at all  Food Insecurity: No Food Insecurity  . Worried About Charity fundraiser in the Last Year: Never true  . Ran Out of Food in the Last Year: Never true  Transportation Needs: No Transportation Needs  . Lack of Transportation (Medical): No  . Lack of  Transportation (Non-Medical): No  Physical Activity: Inactive  . Days of Exercise per Week: 0 days  . Minutes of Exercise per Session: 0 min  Stress: No Stress Concern Present  . Feeling of Stress : Not at all  Social Connections: Not on file     Family History: The patient's family history includes Cancer in her sister; Heart attack in her mother; Heart disease in her mother and sister; Hypertension in her mother; Stroke in her sister.  ROS:   Please see the history of present illness.    Review of Systems  Constitutional: Negative for chills and fever.  HENT: Negative for sore  throat.   Eyes: Negative for blurred vision.  Respiratory: Positive for shortness of breath.   Cardiovascular: Positive for leg swelling. Negative for chest pain, palpitations, orthopnea, claudication and PND.  Gastrointestinal: Negative for nausea and vomiting.  Genitourinary: Negative for dysuria.  Musculoskeletal: Negative for falls.  Neurological: Negative for dizziness and loss of consciousness.  Endo/Heme/Allergies: Negative for polydipsia.  Psychiatric/Behavioral: Negative for substance abuse.    EKGs/Labs/Other Studies Reviewed:    The following studies were reviewed today: Cardiac Monitor 02/02/20:  Sinus bradycardia to sinus rhythm, average heart rate 67 bpm.  First-degree AV block, intermittent Wenckebach episodes while patient is sleeping.  Rare PACs and PVCs.   The patient was in sinus bradycardia to sinus rhythm the entire monitoring time, first-degree AV block and intermittent nocturnal Wenckebach episodes, rare PACs and PVCs, no significant arrhythmias.   Echocardiogram 01/12/2020 EF 60-65, no RWMA, mild LVH, Gr 1 DD, trivial MR, AoV sclerosis (no AS)  Echocardiogram 12/21/2015 Mild LVH, EF 55-60, normal wall motion, GR 1 DD, MAC  Cardiac catheterization 12/20/2015 LAD ostial 25, mid 99; D2 ostial 40 LCx proximal 40 PCI: 2.5 x 20 mm Synergy DES to the mid LAD  EKG:  EKG is   ordered today.  The ekg ordered today demonstrates NSR with low voltage in precordial leads, possible q-waves anteriorly with HR 78 (unchanged from prior)  Recent Labs: 07/11/2020: Hemoglobin 12.5; Platelets 204 11/06/2020: ALT 21; BUN 22; Creatinine, Ser 1.60; Potassium 4.4; Sodium 141  Recent Lipid Panel    Component Value Date/Time   CHOL 130 11/06/2020 1202   TRIG 83 11/06/2020 1202   HDL 51 11/06/2020 1202   CHOLHDL 2.5 11/06/2020 1202   CHOLHDL 3.3 12/21/2015 0400   VLDL 15 12/21/2015 0400   LDLCALC 63 11/06/2020 1202      Physical Exam:    VS:  BP (!) 142/70   Pulse 78   Ht 5' 4.8" (1.646 m)   Wt (!) 305 lb 9.6 oz (138.6 kg)   SpO2 97%   BMI 51.17 kg/m     Wt Readings from Last 3 Encounters:  02/12/21 (!) 305 lb 9.6 oz (138.6 kg)  11/06/20 297 lb 12.8 oz (135.1 kg)  07/11/20 300 lb (136.1 kg)     GEN:  Comfortable elderly female, NAD HEENT: Normal NECK: No JVD; No carotid bruits CARDIAC:RRR, no murmurs, rubs, gallops RESPIRATORY:  Clear to auscultation without rales, wheezing or rhonchi  ABDOMEN: Soft, non-tender, non-distended MUSCULOSKELETAL:  Trace pedal edema, warm SKIN: Warm and dry NEUROLOGIC:  Alert and oriented x 3 PSYCHIATRIC:  Normal affect   ASSESSMENT:    1. Coronary artery disease involving native coronary artery of native heart without angina pectoris   2. Hyperlipidemia, unspecified hyperlipidemia type   3. Essential hypertension   4. Stage 3b chronic kidney disease ([place])   5. Chronic diastolic CHF (congestive heart failure) (HCC)   6. Palpitations   7. Diabetes mellitus type 2 in obese [facility])    PLAN:    In order of problems listed above:  #CAD s/p PCI to mid LAD in 2017: Doing well with no anginal symptoms. -Continue ASA 1m daily -Continue lipitor 857mdaily -Increase bystolic to 5.6.6MAaily -Continue losartan 5091maily  #Chronic Diastolic Heart Failure: Doing well with NYHA class II symptoms. Trace pedal edema on exam.   -Continue lasix 65m7mily -Increase bystolic to 5mg 16mly -Continue losartan 50mg 25my -Continue ozempic 0.25mg q45mly -Start jardiance 10mg da49m #Palpitations: Monitor showed episodes of wenkebach while  sleeping with rare ectopy and no significant arrhythmias. Overall symptoms have improved. -Increase bystolic to 50m daily  #HTN: Elevated at home to 130-140s. Goal <120s/80s. -Increase bystolic to 521mdaily -Continue losartan 5056maily  #CKD Stage IIIb: Cr stable. -Continue lasix 21m34mily as above -Will discuss with pharmacy about finerenone  #HLD: Goal LDL<70 given known CAD s/p PCI. LDL currently 63. -Continue lipitor 80mg42mly and zetia 10mg 71my  #DMII on Insulin: -Continue tresidba -Continue ozempic 0.25mg q36mly -Start jardiance 10mg da51m   Medication Adjustments/Labs and Tests Ordered: Current medicines are reviewed at length with the patient today.  Concerns regarding medicines are outlined above.  Orders Placed This Encounter  Procedures  . EKG 12-Lead   Meds ordered this encounter  Medications  . nebivolol (BYSTOLIC) 5 MG tablet    Sig: Take 1 tablet (5 mg total) by mouth daily.    Dispense:  90 tablet    Refill:  1    Dose increase  . empagliflozin (JARDIANCE) 10 MG TABS tablet    Sig: Take 1 tablet (10 mg total) by mouth daily before breakfast.    Dispense:  90 tablet    Refill:  3    Patient Instructions  Medication Instructions:   INCREASE YOUR BYSTOLIC TO 5 MG BY MOUTH DAILY  DR. PEMBERTOJohney FrameG TO SPEAK TO OUR PHARMACIST ABOUT POSSIBLY STARTING YOU ON FARXIGA 10 MG BY MOUTH DAILY--ONCE SHE SPEAKS WITH THEM, WE WILL CALL YOU BACK IF THIS MEDICATION IS ACCEPTABLE FOR YOU TO START TAKING.  *If you need a refill on your cardiac medications before your next appointment, please call your pharmacy*   Follow-Up: [facility] your health needs are our priority.  As part of our continuing mission to provide you with  exceptional heart care, we have created designated Provider Care Teams.  These Care Teams include your primary Cardiologist (physician) and Advanced Practice Providers (APPs -  Physician Assistants and Nurse Practitioners) who all work together to provide you with the care you need, when you need it.  We recommend signing up for the patient portal called "MyChart".  Sign up information is provided on this After Visit Summary.  MyChart is used to connect with patients for Virtual Visits (Telemedicine).  Patients are able to view lab/test results, encounter notes, upcoming appointments, etc.  Non-urgent messages can be sent to your provider as well.   To learn more about what you can do with MyChart, go to [email or web address] next appointment:   6 month(s)  The format for your next appointment:   In Person  Provider:   You will see one of the following Advanced Practice Providers on your designated Care Team:    Scott WeRichardson DoppVin BhagSouth CongareeDAYNA DUNN PA-C  MICHELE Piedra GordaAURA INCecilie KicksL MCDANIEL NP         Signed, Ayari Liwanag Freada Bergeron31/2022 10:16 AM    [facility]

## 2021-02-12 ENCOUNTER — Telehealth: Payer: Self-pay | Admitting: Pharmacist

## 2021-02-12 ENCOUNTER — Encounter: Payer: Self-pay | Admitting: Cardiology

## 2021-02-12 ENCOUNTER — Ambulatory Visit (INDEPENDENT_AMBULATORY_CARE_PROVIDER_SITE_OTHER): Payer: Medicare Other | Admitting: Cardiology

## 2021-02-12 VITALS — BP 142/70 | HR 78 | Ht 64.8 in | Wt 305.6 lb

## 2021-02-12 DIAGNOSIS — I5032 Chronic diastolic (congestive) heart failure: Secondary | ICD-10-CM

## 2021-02-12 DIAGNOSIS — E785 Hyperlipidemia, unspecified: Secondary | ICD-10-CM | POA: Diagnosis not present

## 2021-02-12 DIAGNOSIS — N1832 Chronic kidney disease, stage 3b: Secondary | ICD-10-CM | POA: Diagnosis not present

## 2021-02-12 DIAGNOSIS — I2510 Atherosclerotic heart disease of native coronary artery without angina pectoris: Secondary | ICD-10-CM | POA: Diagnosis not present

## 2021-02-12 DIAGNOSIS — R002 Palpitations: Secondary | ICD-10-CM | POA: Diagnosis not present

## 2021-02-12 DIAGNOSIS — E669 Obesity, unspecified: Secondary | ICD-10-CM

## 2021-02-12 DIAGNOSIS — I10 Essential (primary) hypertension: Secondary | ICD-10-CM | POA: Diagnosis not present

## 2021-02-12 DIAGNOSIS — E1169 Type 2 diabetes mellitus with other specified complication: Secondary | ICD-10-CM | POA: Diagnosis not present

## 2021-02-12 MED ORDER — EMPAGLIFLOZIN 10 MG PO TABS: 10.0000 mg | ORAL_TABLET | Freq: Every day | ORAL | 3 refills | Status: DC

## 2021-02-12 MED ORDER — NEBIVOLOL HCL 5 MG PO TABS: 5.0000 mg | ORAL_TABLET | Freq: Every day | ORAL | 1 refills | Status: DC

## 2021-02-12 NOTE — Telephone Encounter (Signed)
Sarah Weaver is covered without a PA and at no cost to patient. Called pharmacy to confirm. They have to order medication. It will be in tomorrow. $0 copay as patient has medicare and Medicaid.  Called patient and made her aware. She was appreciative of the help.

## 2021-02-12 NOTE — Patient Instructions (Signed)
Medication Instructions:   INCREASE YOUR BYSTOLIC TO 5 MG BY MOUTH DAILY  DR. Shari Prows IS GOING TO SPEAK TO OUR PHARMACIST ABOUT POSSIBLY STARTING YOU ON FARXIGA 10 MG BY MOUTH DAILY--ONCE SHE SPEAKS WITH THEM, WE WILL CALL YOU BACK IF THIS MEDICATION IS ACCEPTABLE FOR YOU TO START TAKING.  *If you need a refill on your cardiac medications before your next appointment, please call your pharmacy*   Follow-Up: At [facility], you and your health needs are our priority.  As part of our continuing mission to provide you with exceptional heart care, we have created designated Provider Care Teams.  These Care Teams include your primary Cardiologist (physician) and Advanced Practice Providers (APPs -  Physician Assistants and Nurse Practitioners) who all work together to provide you with the care you need, when you need it.  We recommend signing up for the patient portal called "MyChart".  Sign up information is provided on this After Visit Summary.  MyChart is used to connect with patients for Virtual Visits (Telemedicine).  Patients are able to view lab/test results, encounter notes, upcoming appointments, etc.  Non-urgent messages can be sent to your provider as well.   To learn more about what you can do with MyChart, go to [email or web address].    Your next appointment:   6 month(s)  The format for your next appointment:   In Person  Provider:   You will see one of the following Advanced Practice Providers on your designated Care Team:    Tereso Newcomer, PA-C  [place]in [place], PA-C  DAYNA DUNN PA-C  [facility] LENZE PA-C  Nada Boozer NP  JILL [facility] NP

## 2021-02-12 NOTE — Addendum Note (Signed)
Addended by: Malena Peer D on: 02/12/2021 10:16 AM   Modules accepted: Orders

## 2021-02-20 ENCOUNTER — Other Ambulatory Visit: Payer: Self-pay

## 2021-02-20 ENCOUNTER — Ambulatory Visit (INDEPENDENT_AMBULATORY_CARE_PROVIDER_SITE_OTHER): Payer: Medicare Other | Admitting: Nurse Practitioner

## 2021-02-20 ENCOUNTER — Encounter: Payer: Self-pay | Admitting: Nurse Practitioner

## 2021-02-20 DIAGNOSIS — R062 Wheezing: Secondary | ICD-10-CM | POA: Diagnosis not present

## 2021-02-20 DIAGNOSIS — R059 Cough, unspecified: Secondary | ICD-10-CM

## 2021-02-20 LAB — POC COVID19 BINAXNOW: SARS Coronavirus 2 Ag: NEGATIVE

## 2021-02-20 LAB — POC INFLUENZA A&B (BINAX/QUICKVUE)
Influenza A, POC: NEGATIVE
Influenza B, POC: NEGATIVE

## 2021-02-20 MED ORDER — BENZONATATE 100 MG PO CAPS: 100.0000 mg | ORAL_CAPSULE | Freq: Three times a day (TID) | ORAL | 1 refills | Status: AC | PRN

## 2021-02-20 MED ORDER — AMOXICILLIN-POT CLAVULANATE 875-125 MG PO TABS: 1.0000 | ORAL_TABLET | Freq: Two times a day (BID) | ORAL | 0 refills | Status: AC

## 2021-02-20 MED ORDER — ALBUTEROL SULFATE HFA 108 (90 BASE) MCG/ACT IN AERS: 2.0000 | INHALATION_SPRAY | Freq: Four times a day (QID) | RESPIRATORY_TRACT | 2 refills | Status: DC | PRN

## 2021-02-20 NOTE — Progress Notes (Signed)
I,[name],acting as a Education administrator for [employer], NP.,have documented all relevant documentation on the behalf of [employer], NP,as directed by  Bary Castilla, NP while in the presence of Bary Castilla, NP.  This visit occurred during the SARS-CoV-2 public health emergency.  Safety protocols were in place, including screening questions prior to the visit, additional usage of staff PPE, and extensive cleaning of exam room while observing appropriate contact time as indicated for disinfecting solutions.  Subjective:     Patient ID: Sarah Weaver , female    DOB: May 18, 1936 , 85 y.o.   MRN: [id number]   Chief Complaint  Patient presents with  . Cough    HPI  The patient is here today for a outside visit. She tested neg for covid. She has been coughing for a while now. No other symptoms. No fever, congestion. She is wheezing but does not have shortness of breath or chest pain. She denies fatigue or body ache.     Past Medical History:  Diagnosis Date  . Arthritis    "all over"  . Chronic diastolic CHF (congestive heart failure) (HCC)    Echocardiogram 12/2019: EF 60-65, no RWMA, mild LVH, Gr 1 DD, trivial MR, mild AoV sclerosis, no AS  . Chronic lower back pain   . CKD (chronic kidney disease), stage III ([place])   . Coronary artery disease    a. status post DES to the LAD 12/2015.  . Glaucoma   . Glaucoma of both eyes   . Hyperkalemia   . Hyperlipidemia   . Hypertension   . Type II diabetes mellitus (HCC)      Family History  Problem Relation Age of Onset  . Heart disease Mother   . Heart attack Mother   . Hypertension Mother   . Heart disease Sister   . Cancer Sister   . Stroke Sister      Current Outpatient Medications:  .  albuterol (VENTOLIN HFA) 108 (90 Base) MCG/ACT inhaler, Inhale 2 puffs into the lungs every 6 (six) hours as needed for wheezing or shortness of breath., Disp: 8 g, Rfl: 2 .  amoxicillin-clavulanate (AUGMENTIN)  875-125 MG tablet, Take 1 tablet by mouth 2 (two) times daily for 7 days., Disp: 14 tablet, Rfl: 0 .  benzonatate (TESSALON PERLES) 100 MG capsule, Take 1 capsule (100 mg total) by mouth 3 (three) times daily as needed for cough., Disp: 30 capsule, Rfl: 1 .  Accu-Chek Softclix Lancets lancets, USE UP TO 4 TIMES DAILY AS DIRECTED, Disp: 100 each, Rfl: 3 .  amLODipine (NORVASC) 10 MG tablet, TAKE 1 TABLET BY MOUTH EVERY DAY, Disp: 90 tablet, Rfl: 0 .  aspirin EC 81 MG tablet, Take 81 mg by mouth daily., Disp: , Rfl:  .  atorvastatin (LIPITOR) 80 MG tablet, TAKE 1 TABLET BY MOUTH DAILY AT 6PM, Disp: 90 tablet, Rfl: 2 .  blood glucose meter kit and supplies KIT, Dispense based on patient and insurance preference. Use up to four times daily as directed. (FOR ICD-9 250.00, 250.01)., Disp: 1 each, Rfl: 0 .  brinzolamide (AZOPT) 1 % ophthalmic suspension, Place 1 drop into both eyes 3 (three) times daily., Disp: , Rfl:  .  Cholecalciferol 125 MCG (5000 UT) TABS, Take 1 tablet by mouth daily., Disp: , Rfl:  .  empagliflozin (JARDIANCE) 10 MG TABS tablet, Take 1 tablet (10 mg total) by mouth daily before breakfast., Disp: 90 tablet, Rfl: 3 .  ezetimibe (ZETIA) 10 MG tablet, TAKE 1 TABLET  BY MOUTH EVERY DAY, Disp: 90 tablet, Rfl: 3 .  furosemide (LASIX) 40 MG tablet, TAKE 1 TABLET BY MOUTH EVERY DAY, Disp: 90 tablet, Rfl: 1 .  insulin degludec (TRESIBA FLEXTOUCH) 100 UNIT/ML FlexTouch Pen, Inject 10 Units into the skin daily., Disp: 15 mL, Rfl: 3 .  Insulin Pen Needle (BD PEN NEEDLE NANO U/F) 32G X 4 MM MISC, USE NIGHTLY AS DIRECTED, Disp: 100 each, Rfl: 3 .  Latanoprostene Bunod 0.024 % SOLN, Apply 1 drop to eye daily., Disp: , Rfl:  .  levothyroxine (SYNTHROID) 50 MCG tablet, TAKE 1 TABLET BY MOUTH EVERY DAY BEFORE BREAKFAST, Disp: 90 tablet, Rfl: 1 .  LINZESS 145 MCG CAPS capsule, TAKE 1 CAPSULE (145 MCG TOTAL) BY MOUTH DAILY BEFORE BREAKFAST. FOR CONSTIPATION, Disp: 90 capsule, Rfl: 0 .  losartan (COZAAR)  50 MG tablet, Take 50 mg by mouth at bedtime., Disp: , Rfl:  .  Magnesium 250 MG TABS, Take 1 tablet (250 mg total) by mouth every evening. Take with evening meal, Disp: 90 tablet, Rfl: 1 .  nebivolol (BYSTOLIC) 5 MG tablet, Take 1 tablet (5 mg total) by mouth daily., Disp: 90 tablet, Rfl: 1 .  nystatin (NYSTATIN) powder, Apply 1 application topically 3 (three) times daily., Disp: 15 g, Rfl: 0 .  ONETOUCH VERIO test strip, CHECK BLOOD SUGARS ONCE DAILY, Disp: 50 strip, Rfl: 11 .  OZEMPIC, 0.25 OR 0.5 MG/DOSE, 2 MG/1.5ML SOPN, INJECT 0.25 MG INTO THE SKIN ONCE A WEEK., Disp: 3 mL, Rfl: 3 .  traMADol-acetaminophen (ULTRACET) 37.5-325 MG tablet, Take 1 tablet by mouth every 6 (six) hours as needed for moderate pain (prn back pain)., Disp: 30 tablet, Rfl: 0   Allergies  Allergen Reactions  . Carvedilol Cough    Pt reports causes her to cough  . Lisinopril Cough    REPORTS CAUSES DRY COUGH     Review of Systems  Constitutional: Negative for chills, fatigue and fever.  Respiratory: Positive for cough and wheezing.   Cardiovascular: Negative for chest pain and palpitations.  Gastrointestinal: Negative for constipation and diarrhea.  Neurological: Negative for dizziness, weakness and headaches.     Today's Vitals   There is no height or weight on file to calculate BMI.   Objective:  Physical Exam Constitutional:      Appearance: Normal appearance.  Cardiovascular:     Rate and Rhythm: Normal rate and regular rhythm.     Pulses: Normal pulses.     Heart sounds: Normal heart sounds. No murmur heard.   Pulmonary:     Effort: Pulmonary effort is normal. No respiratory distress.     Breath sounds: Wheezing present. No rhonchi.  Musculoskeletal:     Cervical back: Normal range of motion and neck supple.  Skin:    General: Skin is warm and dry.     Capillary Refill: Capillary refill takes less than 2 seconds.  Neurological:     Mental Status: She is alert.         Assessment And  Plan:     1. Cough - benzonatate (TESSALON PERLES) 100 MG capsule; Take 1 capsule (100 mg total) by mouth 3 (three) times daily as needed for cough.  Dispense: 30 capsule; Refill: 1 - amoxicillin-clavulanate (AUGMENTIN) 875-125 MG tablet; Take 1 tablet by mouth 2 (two) times daily for 7 days.  Dispense: 14 tablet; Refill: 0 - POC COVID-19- neg  - POC Influenza A&B (Binax test)- neg  -Will treat her with antx.  -Instructed patient if  symptoms get worse to call us or go to the emergency room.    2. Wheezing -Instructed patient if she experiences any SOB or chest pain to go to the emergency room.  -Consider chest xray if she continues to have wheezing and coughing continues.  - albuterol (VENTOLIN HFA) 108 (90 Base) MCG/ACT inhaler; Inhale 2 puffs into the lungs every 6 (six) hours as needed for wheezing or shortness of breath.  Dispense: 8 g; Refill: 2   The patient was encouraged to call or send a message through [place] for any questions or concerns.   Side effects and appropriate use of all the medication(s) were discussed with the patient today. Patient advised to use the medication(s) as directed by their healthcare provider. The patient was encouraged to read, review, and understand all associated package inserts and contact our office with any questions or concerns. The patient accepts the risks of the treatment plan and had an opportunity to ask questions.   Follow up: if symptoms persist or do not get better.   Patient was given opportunity to ask questions. Patient verbalized understanding of the plan and was able to repeat key elements of the plan. All questions were answered to their satisfaction.  Sarah Kauan Kloosterman, DNP   I, Sarah Weaver have reviewed all documentation for this visit. The documentation on 02/20/21 for the exam, diagnosis, procedures, and orders are all accurate and complete.    IF YOU HAVE BEEN REFERRED TO A SPECIALIST, IT MAY TAKE 1-2 WEEKS TO SCHEDULE/PROCESS THE  REFERRAL. IF YOU HAVE NOT HEARD FROM US/SPECIALIST IN TWO WEEKS, PLEASE GIVE [place] A CALL AT [phone].   THE PATIENT IS ENCOURAGED TO PRACTICE SOCIAL DISTANCING DUE TO THE COVID-19 PANDEMIC.

## 2021-02-20 NOTE — Patient Instructions (Signed)

## 2021-02-21 ENCOUNTER — Ambulatory Visit: Payer: Medicare Other | Admitting: Nurse Practitioner

## 2021-02-26 ENCOUNTER — Other Ambulatory Visit: Payer: Self-pay

## 2021-02-26 MED ORDER — BD PEN NEEDLE NANO U/F 32G X 4 MM MISC: 3 refills | Status: DC

## 2021-03-01 ENCOUNTER — Telehealth: Payer: Self-pay | Admitting: Cardiology

## 2021-03-01 MED ORDER — NEBIVOLOL HCL 2.5 MG PO TABS: 2.5000 mg | ORAL_TABLET | Freq: Every day | ORAL | 1 refills | Status: DC

## 2021-03-01 NOTE — Telephone Encounter (Signed)
Pt c/o medication issue:  1. Name of Medication: nebivolol (BYSTOLIC) 5 MG tablet  2. How are you currently taking this medication (dosage and times per day)? 1 tablet daily  3. Are you having a reaction (difficulty breathing--STAT)? no  4. What is your medication issue? Patient states she was taking 2.5 mg of the bystolic and is now on 5 mg. She states the medication makes her dizzy and she gets hot. She states when she gets new medication she states she takes the new medication first before any others. She states she knows this is the medication making her have the symptoms. She states it feels like fainting spells, but she has not fainted. She states she does not feel symptoms now and has not taken the medication today or the day before yesterday.

## 2021-03-01 NOTE — Telephone Encounter (Signed)
Called the pt back and informed her that per our Pharmacist Malena Peer, we will reduce her Bystolic back down to 2.5 mg po daily, and she should continue keeping track of her blood pressures, and let us know if her numbers are >130/80, for we may considering increasing her losartan. Confirmed the pharmacy of choice with the pt. Pt verbalized understanding and agrees with this plan. Pt was more than gracious for all the assistance provided.

## 2021-03-01 NOTE — Telephone Encounter (Signed)
Pt is calling to inform Dr. Shari Prows and our Pharmacist that ever since the dose of her bystolic was increased from 2.5 mg to 5 mg daily, she has had complaints of dizziness, feeling pre-syncopal, and having hot flashes.    Pt states she is very certain her symptoms are coming from the increase in Bystolic.  Pt states over the last 2 days she held her Bystolic all together, and symptoms completely went away.    Pt states she tolerated Bystolic 2.5 mg po daily very well, and had no issues at all with this.  She indicates the 5 mg dose is too much for her.  Pt states her BP's have been running well for her, with highest reading 130/70 and last reading obtained was 124/68.  Bystolic was increased at last OV in May by Dr. Shari Prows, for HTN.   Advised the pt to continue holding Bystolic until I hear further advisement from Dr. Shari Prows and Pharmacy, as to whether she should decrease the dose back down to 2.5 mg po daily, or stop all together.   Informed her I will follow-up with her accordingly thereafter.  Pt verbalized understanding and agrees with this plan.

## 2021-03-01 NOTE — Telephone Encounter (Signed)
I would have her resume her bystolic at 2.5mg . She should keep track of her blood pressures. If >130/80 at home, will need to consider increasing losartan

## 2021-03-05 ENCOUNTER — Telehealth: Payer: Self-pay

## 2021-03-05 NOTE — Chronic Care Management (AMB) (Signed)
  Patient aware of telephone appointment with Cherylin Mylar CPP on 03-06-2021 at 8:00. Patient aware to have/bring all medications, supplements, blood pressure and/or blood sugar logs to visit.  Questions: Have you had any recent office visit or specialist visit outside of [facility]? Patient stated No  Are there any concerns you would like to discuss during your office visit? Patient stated No  Are you having any problems obtaining your medications? (Whether it pharmacy issues or cost) Patient stated No  If patient has any PAP medications ask if they are having any problems getting their PAP medication or refill?  Star Rating Drug: Atorvastatin 80 MG- Last filled 02-21-2021 [facility] Losartan 25 MG- Last filled 02-21-2021 [facility] Ozempic 0.25 MG- Last filled 12-06-2020 [facility]  Any gaps in medications fill history? No  Huey Romans [facility] Clinical Pharmacist Assistant [phone]

## 2021-03-06 ENCOUNTER — Ambulatory Visit (INDEPENDENT_AMBULATORY_CARE_PROVIDER_SITE_OTHER): Payer: Medicare Other

## 2021-03-06 DIAGNOSIS — N1831 Chronic kidney disease, stage 3a: Secondary | ICD-10-CM | POA: Diagnosis not present

## 2021-03-06 DIAGNOSIS — E782 Mixed hyperlipidemia: Secondary | ICD-10-CM

## 2021-03-06 DIAGNOSIS — E0822 Diabetes mellitus due to underlying condition with diabetic chronic kidney disease: Secondary | ICD-10-CM

## 2021-03-06 DIAGNOSIS — Z794 Long term (current) use of insulin: Secondary | ICD-10-CM

## 2021-03-06 DIAGNOSIS — K5901 Slow transit constipation: Secondary | ICD-10-CM

## 2021-03-06 DIAGNOSIS — I10 Essential (primary) hypertension: Secondary | ICD-10-CM

## 2021-03-06 NOTE — Progress Notes (Signed)
Chronic Care Management Pharmacy Note  03/06/2021 Name:  Sarah Weaver MRN:  [id number] DOB:  17-Jan-1936  Summary: Patient reports that she is doing better and glad she did not COVID-19.  Recommendations/Changes made from today's visit: Recommend patient get COVID-19 booster.  Recommend patient get Shingrix vaccine series.   Plan: Patient to get shingles vaccine.    Subjective: Sarah Weaver is an 85 y.o. year old female who is a primary patient of [name], [facility].  The CCM team was consulted for assistance with disease management and care coordination needs.    Engaged with patient by telephone for follow up visit in response to provider referral for pharmacy case management and/or care coordination services.   Consent to Services:  The patient was given information about Chronic Care Management services, agreed to services, and gave verbal consent prior to initiation of services.  Please see initial visit note for detailed documentation.   Patient Care Team: [name], FNP as PCP - General (General Practice) Dorothy Spark, MD (Inactive) as PCP - Cardiology (Cardiology) Lynne Logan, RN as [facility] Mayford Knife, [facility] (Pharmacist)  Recent office visits: 02/20/2021 PCP OV 11/06/2020 PCP OV   Recent consult visits: 02/12/2021 - Cardiologist changed her Nebivolol 2.5 mg to 5 mg, but has since been switched back to 2.5 mg  Hospital visits: None in previous 6 months   Objective:  Lab Results  Component Value Date   CREATININE 1.60 (H) 11/06/2020   BUN 22 11/06/2020   GFRNONAA 30 (L) 11/06/2020   GFRAA 34 (L) 11/06/2020   NA 141 11/06/2020   K 4.4 11/06/2020   CALCIUM 9.6 11/06/2020   CO2 22 11/06/2020   GLUCOSE 146 (H) 11/06/2020    Lab Results  Component Value Date/Time   HGBA1C 7.3 (H) 11/06/2020 12:02 PM   HGBA1C 7.3 (H) 07/11/2020 11:31 AM    Last diabetic Eye exam:  Lab Results   Component Value Date/Time   HMDIABEYEEXA Retinopathy (A) 10/25/2020 12:00 AM    Last diabetic Foot exam: No results found for: HMDIABFOOTEX   Lab Results  Component Value Date   CHOL 130 11/06/2020   HDL 51 11/06/2020   LDLCALC 63 11/06/2020   TRIG 83 11/06/2020   CHOLHDL 2.5 11/06/2020    Hepatic Function Latest Ref Rng & Units 11/06/2020 07/11/2020 06/07/2020  Total Protein 6.0 - 8.5 g/dL 7.1 7.5 -  Albumin 3.6 - 4.6 g/dL 4.1 4.3 3.9  AST 0 - 40 IU/L 23 22 -  ALT 0 - 32 IU/L 21 23 -  Alk Phosphatase 44 - 121 IU/L 95 90 -  Total Bilirubin 0.0 - 1.2 mg/dL 0.5 0.2 -  Bilirubin, Direct 0.00 - 0.40 mg/dL - - -    Lab Results  Component Value Date/Time   TSH 2.660 12/27/2019 01:10 PM   TSH 1.860 08/25/2019 03:41 PM   FREET4 1.32 05/05/2019 02:34 PM    CBC Latest Ref Rng & Units 07/11/2020 05/04/2020 02/16/2020  WBC 3.4 - 10.8 x10E3/uL 4.2 2.8(L) 3.7  Hemoglobin 11.1 - 15.9 g/dL 12.5 11.4(L) 12.1  Hematocrit 34.0 - 46.6 % 39.3 36.8 36.5  Platelets 150 - 450 x10E3/uL 204 157 211    No results found for: VD25OH  Clinical ASCVD: Yes  The ASCVD Risk score Mikey Bussing DC Jr., et al., 2013) failed to calculate for the following reasons:   The 2013 ASCVD risk score is only valid for ages 5 to 60   The  patient has a prior MI or stroke diagnosis    Depression screen [facility] 2/9 07/11/2020 08/25/2019 05/17/2019  Decreased Interest 0 0 0  Down, Depressed, Hopeless 0 0 0  PHQ - 2 Score 0 0 0  Altered sleeping 0 - -  Tired, decreased energy 0 - -  Change in appetite 0 - -  Feeling bad or failure about yourself  0 - -  Trouble concentrating 0 - -  Moving slowly or fidgety/restless 0 - -  Suicidal thoughts 0 - -  PHQ-9 Score 0 - -  Difficult doing work/chores Not difficult at all - -      Social History   Tobacco Use  Smoking Status Never  Smokeless Tobacco Never   BP Readings from Last 3 Encounters:  02/12/21 (!) 142/70  11/06/20 140/80  07/11/20 (!) 148/80   Pulse Readings  from Last 3 Encounters:  02/12/21 78  11/06/20 77  07/11/20 86   Wt Readings from Last 3 Encounters:  02/12/21 (!) 305 lb 9.6 oz (138.6 kg)  11/06/20 297 lb 12.8 oz (135.1 kg)  07/11/20 300 lb (136.1 kg)   BMI Readings from Last 3 Encounters:  02/12/21 51.17 kg/m  11/06/20 49.86 kg/m  07/11/20 50.23 kg/m    Assessment/Interventions: Review of patient past medical history, allergies, medications, health status, including review of consultants reports, laboratory and other test data, was performed as part of comprehensive evaluation and provision of chronic care management services.   SDOH:  (Social Determinants of Health) assessments and interventions performed: No  SDOH Screenings   Alcohol Screen: Not on file  Depression (PHQ2-9): Low Risk    PHQ-2 Score: 0  Financial Resource Strain: Low Risk    Difficulty of Paying Living Expenses: Not hard at all  Food Insecurity: No Food Insecurity   Worried About Charity fundraiser in the Last Year: Never true   Ran Out of Food in the Last Year: Never true  Housing: Low Risk    Last Housing Risk Score: 0  Physical Activity: Inactive   Days of Exercise per Week: 0 days   Minutes of Exercise per Session: 0 min  Social Connections: Not on file  Stress: No Stress Concern Present   Feeling of Stress : Not at all  Tobacco Use: Low Risk    Smoking Tobacco Use: Never   Smokeless Tobacco Use: Never  Transportation Needs: No Transportation Needs   Lack of Transportation (Medical): No   Lack of Transportation (Non-Medical): No    CCM Care Plan  Allergies  Allergen Reactions   Carvedilol Cough    Pt reports causes her to cough   Lisinopril Cough    REPORTS CAUSES DRY COUGH    Medications Reviewed Today     Reviewed by Mayford Knife, RPH (Pharmacist) on 03/06/21 at [phone]  Med List Status: <None>   Medication Order Taking? Sig Documenting Provider Last Dose Status Informant  Accu-Chek Softclix Lancets lancets [id number]  USE  UP TO 4 TIMES DAILY AS DIRECTED [name], FNP  Active   albuterol (VENTOLIN HFA) 108 (90 Base) MCG/ACT inhaler [id number] Yes Inhale 2 puffs into the lungs every 6 (six) hours as needed for wheezing or shortness of breath. Bary Castilla, NP Taking Active   amLODipine (NORVASC) 10 MG tablet [id number] Yes TAKE 1 TABLET BY MOUTH EVERY DAY [name], FNP Taking Active   aspirin EC 81 MG tablet [id number] Yes Take 81 mg by mouth daily. [provider] Taking Active Self  atorvastatin (LIPITOR) 80 MG tablet [id number] Yes TAKE 1 TABLET BY MOUTH DAILY AT Achilles Dunk, Jamse Belfast, MD Taking Active   benzonatate (TESSALON PERLES) 100 MG capsule [id number]  Take 1 capsule (100 mg total) by mouth 3 (three) times daily as needed for cough. Bary Castilla, NP  Active   blood glucose meter kit and supplies KIT [id number]  Dispense based on patient and insurance preference. Use up to four times daily as directed. (FOR ICD-9 250.00, 250.01). [name], FNP  Active   brinzolamide (AZOPT) 1 % ophthalmic suspension [id number]  Place 1 drop into both eyes 3 (three) times daily. [provider]  Active Self  Cholecalciferol 125 MCG (5000 UT) TABS [id number] Yes Take 1 tablet by mouth daily. [provider] Taking Active   empagliflozin (JARDIANCE) 10 MG TABS tablet [id number] Yes Take 1 tablet (10 mg total) by mouth daily before breakfast. Freada Bergeron, MD Taking Active   ezetimibe (ZETIA) 10 MG tablet [id number] Yes TAKE 1 TABLET BY MOUTH EVERY DAY Richardson Dopp T, PA-C Taking Active   furosemide (LASIX) 40 MG tablet [id number] Yes TAKE 1 TABLET BY MOUTH EVERY DAY Dorothy Spark, MD Taking Active   insulin degludec (TRESIBA FLEXTOUCH) 100 UNIT/ML FlexTouch Pen [id number] Yes Inject 10 Units into the skin daily. [name], FNP Taking Active   Insulin Pen Needle (BD PEN NEEDLE NANO U/F) 32G X 4 MM MISC [id number] Yes USE NIGHTLY AS DIRECTED [name], FNP Taking Active    Latanoprostene Bunod 0.024 % SOLN [id number] Yes Apply 1 drop to eye daily. [provider] Taking Active Self  levothyroxine (SYNTHROID) 50 MCG tablet [id number] Yes TAKE 1 TABLET BY MOUTH EVERY DAY BEFORE Evelena Peat, FNP Taking Active   LINZESS 145 MCG CAPS capsule [id number] Yes TAKE 1 CAPSULE (145 MCG TOTAL) BY MOUTH DAILY BEFORE BREAKFAST. FOR CONSTIPATION [name], FNP Taking Active   losartan (COZAAR) 50 MG tablet [id number] Yes Take 50 mg by mouth at bedtime. Rexene Agent, MD Taking Active   Magnesium 250 MG TABS [id number] Yes Take 1 tablet (250 mg total) by mouth every evening. Take with evening meal [name], FNP Taking Active   nebivolol (BYSTOLIC) 2.5 MG tablet [id number] Yes Take 1 tablet (2.5 mg total) by mouth daily. Freada Bergeron, MD Taking Active   nystatin (NYSTATIN) powder [id number] Yes Apply 1 application topically 3 (three) times daily. [name], FNP Taking Active   [facility] VERIO test strip [id number] Yes CHECK BLOOD SUGARS ONCE DAILY Rodriguez-Southworth, Sunday Spillers, PA-C Taking Active   OZEMPIC, 0.25 OR 0.5 MG/DOSE, 2 MG/1.5ML SOPN [id number] Yes INJECT 0.25 MG INTO THE SKIN ONCE A WEEK. [name], FNP Taking Active   traMADol-acetaminophen [facility]) 37.5-325 MG tablet [id number] Yes Take 1 tablet by mouth every 6 (six) hours as needed for moderate pain (prn back pain). [name], FNP Taking Active             Patient Active Problem List   Diagnosis Date Noted   Class 3 severe obesity due to excess calories with serious comorbidity in adult [facility]) 08/25/2019   Diabetes mellitus due to underlying condition with stage 3a chronic kidney disease, with long-term current use of insulin ([place]) 06/16/2019   CAD (coronary artery disease) 07/26/2018   Chronic diastolic CHF (congestive heart failure) ([place]) 07/26/2018   Chronic kidney disease (CKD), stage III (moderate) (HCC) 07/26/2018   Chest pain, moderate coronary artery risk  12/20/2015   DM2 (diabetes mellitus, type 2) ([place]) 12/20/2015  HTN (hypertension) 12/20/2015   HLD (hyperlipidemia) 12/20/2015   Ischemic chest pain ([place]) 12/20/2015   NSTEMI (non-ST elevated myocardial infarction) [facility])    Hypertensive heart disease without heart failure    Acute renal failure ([place])    Cholelithiasis 04/14/2011    Immunization History  Administered Date(s) Administered   Fluad Quad(high Dose 65+) 07/11/2020   Influenza, High Dose Seasonal PF 07/29/2017, 07/08/2018, 06/23/2019   Influenza-Unspecified 06/15/2018   PFIZER(Purple Top)SARS-COV-2 Vaccination 09/26/2020, 10/17/2020   Pneumococcal Polysaccharide-23 04/24/2011, 07/29/2017   Tdap 09/22/2018    Conditions to be addressed/monitored:  Hypertension, Hyperlipidemia, and Diabetes  Care Plan : [place]  Updates made by Mayford Knife, [place] since 03/06/2021 12:00 AM     Problem: HTN, HLD, DM II   Priority: High     Long-Range Goal: Patient Stated   Start Date: 12/12/2020  Recent Progress: On track  Priority: High  Note:    Current Barriers:  Does not adhere to prescribed medication regimen   Pharmacist Clinical Goal(s):  Patient will achieve adherence to monitoring guidelines and medication adherence to achieve therapeutic efficacy through collaboration with PharmD and provider.   Interventions: 1:1 collaboration with [name], FNP regarding development and update of comprehensive plan of care as evidenced by provider attestation and co-signature Inter-disciplinary care team collaboration (see longitudinal plan of care) Comprehensive medication review performed; medication list updated in electronic medical record    Current Barriers:  Unable to achieve control of hypertension   Pharmacist Clinical Goal(s):  Patient will achieve adherence to monitoring guidelines and medication adherence to achieve therapeutic efficacy through collaboration with PharmD and provider.    Interventions: 1:1 collaboration with [name], FNP regarding development and update of comprehensive plan of care as evidenced by provider attestation and co-signature Inter-disciplinary care team collaboration (see longitudinal plan of care) Comprehensive medication review performed; medication list updated in electronic medical record  Hypertension (BP goal <130/80) -Uncontrolled -Current treatment: Amlodipine 10 mg taking 1 tablet daily Losartan 50 mg tablet daily  Nebivolol 2.5 mg taking 1 tablet by mouth daily  -Current home readings: 133/73,  -Current dietary habits: patient reports that she does not eat any salt.  -Denies hypotensive/hypertensive symptoms -Nebivolol was changed by cardiologist to 5 mg, but she felt faint and weak, she was told to go back to Nebivolol 2.5 mg tablet daily. -Educated on BP goals and benefits of medications for prevention of heart attack, stroke and kidney damage; Daily salt intake goal < 2300 mg; Importance of home blood pressure monitoring; Proper BP monitoring technique; -Counseled to monitor BP at home at least five times per week, document, and provide log at future appointments -Recommended to continue current medication -Health concierge to do monthly HTN assessment calls   Hyperlipidemia: (LDL goal < 70) -Controlled -Current treatment: Atorvastatin 80 mg tablet once daily Ezetimibe 10 mg tablet once per day Aspirin 81 mg tablet daily -Current dietary patterns: she tries to avoid fried and fatty food -Current exercise habits: will discuss further during next office visit -Educated on Cholesterol goals;  Benefits of statin for ASCVD risk reduction; Importance of limiting foods high in cholesterol; -Counseled on diet and exercise extensively Recommended to continue current medication  Diabetes (A1c goal <8%) -Controlled -Current medications: Jardiance 10 mg tablet once per day with breakfast Ozempic 0.25 mg into the skin  once a week on Tuesday Insulin degludec - inject 10 units into the skin daily -Current home glucose readings fasting glucose: 122 - 128  -Denies  hypoglycemic/hyperglycemic symptoms -Current meal patterns:  breakfast: egg, piece of toast, [place] bacon or [place] sausage lunch: does not eat  dinner: vegetables: collard greens, cabbages, turnip greens, sliced tomato, she does not always eat meat  snacks: sometimes, fruit drinks: plenty of water daily, at least 4-6 bottles of 16 ounces  -Current exercise: she was walking outside, but she is not doing that while its hot.  -Educated on Complications of diabetes including kidney damage, retinal damage, and cardiovascular disease; Exercise goal of 150 minutes per week; Benefits of weight loss; Prevention and management of hypoglycemic episodes; -Counseled to check feet daily and get yearly eye exams -Counseled on diet and exercise extensively Recommended to continue current medication  Constipation (Goal: Reduce constipation) -Controlled -Current treatment  Linzess 145 mcg- take 1 capsule by mouth daily for breakfast for constipation.  -Recommended to continue current medication  Health Maintenance -Vaccine gaps:    -COVID-19 Vaccine: Due for Booster    -Shingrix Vaccine: patient qualifies for vaccination -Current therapy:  Zinc 500 mg - take 1 capsule by mouth daily Vitamin C 500 mg - take 1 tablet dialy  Cholecalciferol 5000 UT- take 1 capsule daily  -Educated on Cost vs benefit of each product must be carefully weighed by individual consumer -Patient is satisfied with current therapy and denies issues -Recommended to continue current medication  Patient Goals/Self-Care Activities Patient will:  - take medications as prescribed check blood pressure at least 4 times per week and, document, and provide at future appointments  Follow Up Plan: The patient has been provided with contact information for the care management team and has  been advised to call with any health related questions or concerns.        Medication Assistance: None required.  Patient affirms current coverage meets needs.  Compliance/Adherence/Medication fill history: Care Gaps: Shingrix vaccine  Star-Rating Drugs: Losartan 50 mg Ozempic 0.25 mg Atorvastatin 80 mg Jardiance 10 mg  Patient's preferred pharmacy is:  [id number] [place] [place] - [address] [place]Phone: [phone]: [phone] [facility] [place] [place]Phone: [phone]: [phone] Uses pill box? Yes Pt endorses 95% compliance  We discussed: Benefits of medication synchronization, packaging and delivery as well as enhanced pharmacist oversight with [employer]. Patient decided to: Continue current medication management strategy  Care Plan and Follow Up Patient Decision:  Patient agrees to Care Plan and Follow-up.  Plan: Telephone follow up appointment with care management team member scheduled for:  06/19/2021 and The patient has been provided with contact information for the care management team and has been advised to call with any health related questions or concerns.   VOrlando Penner PharmD Clinical Pharmacist [facility] [phone]    Current Barriers:  Unable to achieve control of hypertension   Pharmacist Clinical Goal(s):  Patient will achieve adherence to monitoring guidelines and medication adherence to achieve therapeutic efficacy through collaboration with PharmD and provider.   Interventions: 1:1 collaboration with MMinette Brine FNP regarding development and update of comprehensive plan of care as evidenced by provider attestation and co-signature Inter-disciplinary care team collaboration (see longitudinal plan of care) Comprehensive  medication review performed; medication list updated in electronic medical record  Hypertension (BP goal <130/80) -Uncontrolled -Current treatment: Amlodipine 10 mg taking 1 tablet daily Losartan 50 mg tablet daily  Nebivolol  2.5 mg taking 1 tablet by mouth daily  -Current home readings: 133/73,  -Current dietary habits: patient reports that she does not eat any salt.  -Denies hypotensive/hypertensive symptoms -Nebivolol was changed by cardiologist to 5 mg, but she felt faint and weak, she was told to go back to Nebivolol 2.5 mg tablet daily. -Educated on BP goals and benefits of medications for prevention of heart attack, stroke and kidney damage; Daily salt intake goal < 2300 mg; Importance of home blood pressure monitoring; Proper BP monitoring technique; -Counseled to monitor BP at home at least five times per week, document, and provide log at future appointments -Recommended to continue current medication -Health concierge to do monthly HTN assessment calls   Hyperlipidemia: (LDL goal < 70) -Controlled -Current treatment: Atorvastatin 80 mg tablet once daily Ezetimibe 10 mg tablet once per day Aspirin 81 mg tablet daily -Current dietary patterns: she tries to avoid fried and fatty food -Current exercise habits: will discuss further during next office visit -Educated on Cholesterol goals;  Benefits of statin for ASCVD risk reduction; Importance of limiting foods high in cholesterol; -Counseled on diet and exercise extensively Recommended to continue current medication  Diabetes (A1c goal <8%) -Controlled -Current medications: Jardiance 10 mg tablet once per day with breakfast Ozempic 0.25 mg into the skin once a week on Tuesday Insulin degludec - inject 10 units into the skin daily -Current home glucose readings fasting glucose: 122 - 128  -Denies hypoglycemic/hyperglycemic symptoms -Current meal patterns:  breakfast: egg, piece of toast, [place] bacon or [place]  sausage lunch: does not eat  dinner: vegetables: collard greens, cabbages, turnip greens, sliced tomato, she does not always eat meat  snacks: sometimes, fruit drinks: plenty of water daily, at least 4-6 bottles of 16 ounces  -Current exercise: she was walking outside, but she is not doing that while its hot.  -Educated on Complications of diabetes including kidney damage, retinal damage, and cardiovascular disease; Exercise goal of 150 minutes per week; Benefits of weight loss; Prevention and management of hypoglycemic episodes; -Counseled to check feet daily and get yearly eye exams -Counseled on diet and exercise extensively Recommended to continue current medication  Constipation (Goal: Reduce constipation) -Controlled -Current treatment  Linzess 145 mcg- take 1 capsule by mouth daily for breakfast for constipation.  -Recommended to continue current medication  Health Maintenance -Vaccine gaps:    -COVID-19 Vaccine: Due for Booster    -Shingrix Vaccine  -Current therapy:  Zinc 500 mg - take 1 capsule by mouth daily Vitamin C 500 mg - take 1 tablet dialy  Cholecalciferol 5000 UT- take 1 capsule daily  -Educated on Cost vs benefit of each product must be carefully weighed by individual consumer -Patient is satisfied with current therapy and denies issues -Recommended to continue current medication  Patient Goals/Self-Care Activities Patient will:  - take medications as prescribed check blood pressure at least 4 times per week and, document, and provide at future appointments  Follow Up Plan: The patient has been provided with contact information for the care management team and has been advised to call with any health related questions or concerns.

## 2021-03-06 NOTE — Patient Instructions (Signed)
Visit Information It was great speaking with you today!  Please let me know if you have any questions about our visit.   Goals Addressed             This Visit's Progress    Manage My Medicine       Timeframe:  Long-Range Goal Priority:  High Start Date:                             Expected End Date:                       Follow Up Date: 06/19/2021   - call for medicine refill 2 or 3 days before it runs out - call if I am sick and can't take my medicine - keep a list of all the medicines I take; vitamins and herbals too - use a pillbox to sort medicine - use an alarm clock or phone to remind me to take my medicine    Why is this important?   These steps will help you keep on track with your medicines.           Patient Care Plan: CCM Pharmacy Care Plan     Problem Identified: HTN, HLD, DM II   Priority: High     Long-Range Goal: Patient Stated   Start Date: 12/12/2020  Recent Progress: On track  Priority: High  Note:    Current Barriers:  Does not adhere to prescribed medication regimen   Pharmacist Clinical Goal(s):  Patient will achieve adherence to monitoring guidelines and medication adherence to achieve therapeutic efficacy through collaboration with PharmD and provider.   Interventions: 1:1 collaboration with [name], FNP regarding development and update of comprehensive plan of care as evidenced by provider attestation and co-signature Inter-disciplinary care team collaboration (see longitudinal plan of care) Comprehensive medication review performed; medication list updated in electronic medical record    Current Barriers:  Unable to achieve control of hypertension   Pharmacist Clinical Goal(s):  Patient will achieve adherence to monitoring guidelines and medication adherence to achieve therapeutic efficacy through collaboration with PharmD and provider.   Interventions: 1:1 collaboration with [name], FNP regarding development and  update of comprehensive plan of care as evidenced by provider attestation and co-signature Inter-disciplinary care team collaboration (see longitudinal plan of care) Comprehensive medication review performed; medication list updated in electronic medical record  Hypertension (BP goal <130/80) -Uncontrolled -Current treatment: Amlodipine 10 mg taking 1 tablet daily Losartan 50 mg tablet daily  Nebivolol 2.5 mg taking 1 tablet by mouth daily  -Current home readings: 133/73,  -Current dietary habits: patient reports that she does not eat any salt.  -Denies hypotensive/hypertensive symptoms -Nebivolol was changed by cardiologist to 5 mg, but she felt faint and weak, she was told to go back to Nebivolol 2.5 mg tablet daily. -Educated on BP goals and benefits of medications for prevention of heart attack, stroke and kidney damage; Daily salt intake goal < 2300 mg; Importance of home blood pressure monitoring; Proper BP monitoring technique; -Counseled to monitor BP at home at least five times per week, document, and provide log at future appointments -Recommended to continue current medication -Health concierge to do monthly HTN assessment calls   Hyperlipidemia: (LDL goal < 70) -Controlled -Current treatment: Atorvastatin 80 mg tablet once daily Ezetimibe 10 mg tablet once per day Aspirin 81 mg tablet daily -Current dietary patterns: she tries to  avoid fried and fatty food -Current exercise habits: will discuss further during next office visit -Educated on Cholesterol goals;  Benefits of statin for ASCVD risk reduction; Importance of limiting foods high in cholesterol; -Counseled on diet and exercise extensively Recommended to continue current medication  Diabetes (A1c goal <8%) -Controlled -Current medications: Jardiance 10 mg tablet once per day with breakfast Ozempic 0.25 mg into the skin once a week on Tuesday Insulin degludec - inject 10 units into the skin daily -Current  home glucose readings fasting glucose: 122 - 128  -Denies hypoglycemic/hyperglycemic symptoms -Current meal patterns:  breakfast: egg, piece of toast, [place] bacon or [place] sausage lunch: does not eat  dinner: vegetables: collard greens, cabbages, turnip greens, sliced tomato, she does not always eat meat  snacks: sometimes, fruit drinks: plenty of water daily, at least 4-6 bottles of 16 ounces  -Current exercise: she was walking outside, but she is not doing that while its hot.  -Educated on Complications of diabetes including kidney damage, retinal damage, and cardiovascular disease; Exercise goal of 150 minutes per week; Benefits of weight loss; Prevention and management of hypoglycemic episodes; -Counseled to check feet daily and get yearly eye exams -Counseled on diet and exercise extensively Recommended to continue current medication  Constipation (Goal: Reduce constipation) -Controlled -Current treatment  Linzess 145 mcg- take 1 capsule by mouth daily for breakfast for constipation.  -Recommended to continue current medication  Health Maintenance -Vaccine gaps:    -COVID-19 Vaccine: Due for Booster    -Shingrix Vaccine: patient qualifies for vaccination -Current therapy:  Zinc 500 mg - take 1 capsule by mouth daily Vitamin C 500 mg - take 1 tablet dialy  Cholecalciferol 5000 UT- take 1 capsule daily  -Educated on Cost vs benefit of each product must be carefully weighed by individual consumer -Patient is satisfied with current therapy and denies issues -Recommended to continue current medication  Patient Goals/Self-Care Activities Patient will:  - take medications as prescribed check blood pressure at least 4 times per week and, document, and provide at future appointments  Follow Up Plan: The patient has been provided with contact information for the care management team and has been advised to call with any health related questions or concerns.      Patient  agreed to services and verbal consent obtained.   The patient verbalized understanding of instructions, educational materials, and care plan provided today and agreed to receive a mailed copy of patient instructions, educational materials, and care plan.   Cherylin Mylar, PharmD Clinical Pharmacist [facility] [phone]

## 2021-03-07 ENCOUNTER — Ambulatory Visit: Payer: Medicare Other | Admitting: Nurse Practitioner

## 2021-03-12 ENCOUNTER — Telehealth: Payer: Medicare Other

## 2021-03-16 ENCOUNTER — Other Ambulatory Visit: Payer: Self-pay | Admitting: Nurse Practitioner

## 2021-03-19 ENCOUNTER — Encounter: Payer: Self-pay | Admitting: Nurse Practitioner

## 2021-03-19 ENCOUNTER — Ambulatory Visit (INDEPENDENT_AMBULATORY_CARE_PROVIDER_SITE_OTHER): Payer: Medicare Other | Admitting: Nurse Practitioner

## 2021-03-19 ENCOUNTER — Other Ambulatory Visit: Payer: Self-pay

## 2021-03-19 VITALS — BP 134/80 | HR 75 | Temp 98.2°F | Ht 64.8 in | Wt 299.2 lb

## 2021-03-19 DIAGNOSIS — E039 Hypothyroidism, unspecified: Secondary | ICD-10-CM | POA: Diagnosis not present

## 2021-03-19 DIAGNOSIS — E0822 Diabetes mellitus due to underlying condition with diabetic chronic kidney disease: Secondary | ICD-10-CM | POA: Diagnosis not present

## 2021-03-19 DIAGNOSIS — I129 Hypertensive chronic kidney disease with stage 1 through stage 4 chronic kidney disease, or unspecified chronic kidney disease: Secondary | ICD-10-CM

## 2021-03-19 DIAGNOSIS — Z6841 Body Mass Index (BMI) 40.0 and over, adult: Secondary | ICD-10-CM

## 2021-03-19 DIAGNOSIS — N1831 Chronic kidney disease, stage 3a: Secondary | ICD-10-CM | POA: Diagnosis not present

## 2021-03-19 DIAGNOSIS — E782 Mixed hyperlipidemia: Secondary | ICD-10-CM | POA: Diagnosis not present

## 2021-03-19 DIAGNOSIS — E662 Morbid (severe) obesity with alveolar hypoventilation: Secondary | ICD-10-CM

## 2021-03-19 DIAGNOSIS — Z794 Long term (current) use of insulin: Secondary | ICD-10-CM

## 2021-03-19 DIAGNOSIS — Z23 Encounter for immunization: Secondary | ICD-10-CM

## 2021-03-19 MED ORDER — ZOSTER VAC RECOMB ADJUVANTED 50 MCG/0.5ML IM SUSR: 0.5000 mL | Freq: Once | INTRAMUSCULAR | 0 refills | Status: AC

## 2021-03-19 MED ORDER — ATORVASTATIN CALCIUM 80 MG PO TABS: ORAL_TABLET | ORAL | 2 refills | Status: DC

## 2021-03-19 NOTE — Progress Notes (Signed)
I,Yamilka [employer] as a Education administrator for [employer], FNP.,have documented all relevant documentation on the behalf of [name], FNP,as directed by  [name], FNP while in the presence of [name], [place].   This visit occurred during the SARS-CoV-2 public health emergency.  Safety protocols were in place, including screening questions prior to the visit, additional usage of staff PPE, and extensive cleaning of exam room while observing appropriate contact time as indicated for disinfecting solutions.  Subjective:     Patient ID: Sarah Weaver , female    DOB: [birth year] , 85 y.o.   MRN: [id number]   Chief Complaint  Patient presents with   Diabetes    HPI  Patient presents today for a f/u on her bp and dm.  She has seen Dr. Lenard Forth - Cardiology. She had her bystolic changed to 65m she had become sick and this was decreased to 2.5 mg  Wt Readings from Last 3 Encounters: 03/19/21 : 299 lb 3.2 oz (135.7 kg) 02/12/21 : (!) 305 lb 9.6 oz (138.6 kg) 11/06/20 : 297 lb 12.8 oz (135.1 kg)    Diabetes She presents for her follow-up diabetic visit. She has type 2 diabetes mellitus. There are no hypoglycemic associated symptoms. There are no hypoglycemic complications. Risk factors for coronary artery disease include diabetes mellitus, obesity and sedentary lifestyle. Current diabetic treatment includes oral agent (dual therapy). (Blood sugar averages 122)    Past Medical History:  Diagnosis Date   Arthritis    "all over"   Chronic diastolic CHF (congestive heart failure) ([facility]    Echocardiogram 12/2019: EF 60-65, no RWMA, mild LVH, Gr 1 DD, trivial MR, mild AoV sclerosis, no AS   Chronic lower back pain    CKD (chronic kidney disease), stage III (HCC)    Coronary artery disease    a. status post DES to the LAD 12/2015.   Glaucoma    Glaucoma of both eyes    Hyperkalemia    Hyperlipidemia    Hypertension    Type II diabetes mellitus ([facility]      Family  History  Problem Relation Age of Onset   Heart disease Mother    Heart attack Mother    Hypertension Mother    Heart disease Sister    Cancer Sister    Stroke Sister      Current Outpatient Medications:    Accu-Chek Softclix Lancets lancets, USE UP TO 4 TIMES DAILY AS DIRECTED, Disp: 100 each, Rfl: 3   albuterol (VENTOLIN HFA) 108 (90 Base) MCG/ACT inhaler, Inhale 2 puffs into the lungs every 6 (six) hours as needed for wheezing or shortness of breath., Disp: 8 g, Rfl: 2   amLODipine (NORVASC) 10 MG tablet, TAKE 1 TABLET BY MOUTH EVERY DAY, Disp: 90 tablet, Rfl: 0   aspirin EC 81 MG tablet, Take 81 mg by mouth daily., Disp: , Rfl:    benzonatate (TESSALON PERLES) 100 MG capsule, Take 1 capsule (100 mg total) by mouth 3 (three) times daily as needed for cough., Disp: 30 capsule, Rfl: 1   blood glucose meter kit and supplies KIT, Dispense based on patient and insurance preference. Use up to four times daily as directed. (FOR ICD-9 250.00, 250.01)., Disp: 1 each, Rfl: 0   brinzolamide (AZOPT) 1 % ophthalmic suspension, Place 1 drop into both eyes 3 (three) times daily., Disp: , Rfl:    Cholecalciferol 125 MCG (5000 UT) TABS, Take 1 tablet by mouth daily., Disp: , Rfl:  empagliflozin (JARDIANCE) 10 MG TABS tablet, Take 1 tablet (10 mg total) by mouth daily before breakfast., Disp: 90 tablet, Rfl: 3   ezetimibe (ZETIA) 10 MG tablet, TAKE 1 TABLET BY MOUTH EVERY DAY, Disp: 90 tablet, Rfl: 3   furosemide (LASIX) 40 MG tablet, TAKE 1 TABLET BY MOUTH EVERY DAY, Disp: 90 tablet, Rfl: 1   insulin degludec (TRESIBA FLEXTOUCH) 100 UNIT/ML FlexTouch Pen, Inject 10 Units into the skin daily., Disp: 15 mL, Rfl: 3   Insulin Pen Needle (BD PEN NEEDLE NANO U/F) 32G X 4 MM MISC, USE NIGHTLY AS DIRECTED, Disp: 100 each, Rfl: 3   Latanoprostene Bunod 0.024 % SOLN, Apply 1 drop to eye daily., Disp: , Rfl:    levothyroxine (SYNTHROID) 50 MCG tablet, TAKE 1 TABLET BY MOUTH EVERY DAY BEFORE BREAKFAST, Disp: 90  tablet, Rfl: 1   LINZESS 145 MCG CAPS capsule, TAKE 1 CAPSULE (145 MCG TOTAL) BY MOUTH DAILY BEFORE BREAKFAST. FOR CONSTIPATION, Disp: 90 capsule, Rfl: 0   losartan (COZAAR) 50 MG tablet, Take 50 mg by mouth at bedtime., Disp: , Rfl:    Magnesium 250 MG TABS, Take 1 tablet (250 mg total) by mouth every evening. Take with evening meal, Disp: 90 tablet, Rfl: 1   nebivolol (BYSTOLIC) 2.5 MG tablet, Take 1 tablet (2.5 mg total) by mouth daily., Disp: 90 tablet, Rfl: 1   nystatin (NYSTATIN) powder, Apply 1 application topically 3 (three) times daily., Disp: 15 g, Rfl: 0   ONETOUCH VERIO test strip, CHECK BLOOD SUGARS ONCE DAILY, Disp: 50 strip, Rfl: 11   OZEMPIC, 0.25 OR 0.5 MG/DOSE, 2 MG/1.5ML SOPN, INJECT 0.25 MG INTO THE SKIN ONCE A WEEK., Disp: 3 mL, Rfl: 3   traMADol-acetaminophen (ULTRACET) 37.5-325 MG tablet, Take 1 tablet by mouth every 6 (six) hours as needed for moderate pain (prn back pain)., Disp: 30 tablet, Rfl: 0   atorvastatin (LIPITOR) 80 MG tablet, Take 1 tablet by mouth at bedtime, Disp: 90 tablet, Rfl: 2   Allergies  Allergen Reactions   Carvedilol Cough    Pt reports causes her to cough   Lisinopril Cough    REPORTS CAUSES DRY COUGH     Review of Systems  Constitutional: Negative.   HENT: Negative.    Eyes: Negative.   Respiratory: Negative.    Cardiovascular: Negative.   Gastrointestinal: Negative.   Endocrine: Negative.   Genitourinary: Negative.   Musculoskeletal: Negative.   Skin: Negative.   Allergic/Immunologic: Negative.   Neurological: Negative.   Hematological: Negative.   Psychiatric/Behavioral: Negative.      Today's Vitals   03/19/21 1149  BP: 134/80  Pulse: 75  Temp: 98.2 F (36.8 C)  Weight: 299 lb 3.2 oz (135.7 kg)  Height: 5' 4.8" (1.646 m)  PainSc: 0-No pain   Body mass index is 50.1 kg/m.   Objective:  Physical Exam Constitutional:      General: She is not in acute distress.    Appearance: Normal appearance. She is obese.   Cardiovascular:     Rate and Rhythm: Normal rate and regular rhythm.     Pulses: Normal pulses.     Heart sounds: Normal heart sounds. No murmur heard. Pulmonary:     Effort: Pulmonary effort is normal. No respiratory distress.     Breath sounds: Normal breath sounds. No wheezing.  Musculoskeletal:        General: No swelling or tenderness. Normal range of motion.     Cervical back: Normal range of motion and neck supple.  Right lower leg: No edema.     Left lower leg: No edema.  Skin:    General: Skin is warm and dry.     Capillary Refill: Capillary refill takes less than 2 seconds.  Neurological:     General: No focal deficit present.     Mental Status: She is alert and oriented to person, place, and time.  Psychiatric:        Mood and Affect: Mood normal.        Behavior: Behavior normal.        Thought Content: Thought content normal.        Judgment: Judgment normal.        Assessment And Plan:     1. Diabetes mellitus due to underlying condition with stage 3a chronic kidney disease, with long-term current use of insulin (HCC) Comments: Stable, continue with current medications  2. Essential hypertension Comments: Fair control, continue current medications  3. Mixed hyperlipidemia Comments: Stable, continue current medications Tolerating statin well - atorvastatin (LIPITOR) 80 MG tablet; Take 1 tablet by mouth at bedtime  Dispense: 90 tablet; Refill: 2  4. Acquired hypothyroidism Comments: Levels have been within normal range, will check again today - TSH - T4 - T3, free  5. Class 3 obesity with alveolar hypoventilation and body mass index (BMI) of 50.0 to 59.9 in adult, unspecified whether serious comorbidity present (HCC) Chronic Discussed healthy diet and regular exercise options  Encouraged to exercise at least 150 minutes per week with 2 days of strength training  6. Immunization due - Zoster Vaccine Adjuvanted [facility]) injection; Inject 0.5 mLs  into the muscle once for 1 dose.  Dispense: 0.5 mL; Refill: 0    Patient was given opportunity to ask questions. Patient verbalized understanding of the plan and was able to repeat key elements of the plan. All questions were answered to their satisfaction.  [name], FNP   I, [name], FNP, have reviewed all documentation for this visit. The documentation on 03/19/21 for the exam, diagnosis, procedures, and orders are all accurate and complete.   IF YOU HAVE BEEN REFERRED TO A SPECIALIST, IT MAY TAKE 1-2 WEEKS TO SCHEDULE/PROCESS THE REFERRAL. IF YOU HAVE NOT HEARD FROM US/SPECIALIST IN TWO WEEKS, PLEASE GIVE [place] A CALL AT [phone].   THE PATIENT IS ENCOURAGED TO PRACTICE SOCIAL DISTANCING DUE TO THE COVID-19 PANDEMIC.

## 2021-03-20 LAB — T3, FREE: T3, Free: 2.2 pg/mL (ref 2.0–4.4)

## 2021-03-20 LAB — T4: T4, Total: 8.9 ug/dL (ref 4.5–12.0)

## 2021-03-20 LAB — TSH: TSH: 1.77 u[IU]/mL (ref 0.450–4.500)

## 2021-03-26 ENCOUNTER — Telehealth: Payer: Self-pay

## 2021-03-26 ENCOUNTER — Telehealth: Payer: Medicare Other

## 2021-03-26 NOTE — Telephone Encounter (Signed)
  Care Management   Follow Up Note   03/26/2021 Name: Sarah Weaver MRN: [id number] DOB: [birth year]   Referred by: [name], FNP Reason for referral : Chronic Care Management (RN CM Follow up call )   An unsuccessful telephone outreach was attempted today. The patient was referred to the case management team for assistance with care management and care coordination.   Follow Up Plan: Telephone follow up appointment with care management team member scheduled for: 04/24/21  Delsa Sale, RN, BSN, CCM Care Management Coordinator [facility] Care Management/[facility]  Direct Phone: [phone]

## 2021-04-03 ENCOUNTER — Telehealth: Payer: Self-pay

## 2021-04-03 NOTE — Chronic Care Management (AMB) (Signed)
Chronic Care Management Pharmacy Assistant   Name: Sarah Weaver  MRN: [id number] DOB: [birth year]   Reason for Encounter: Disease State/ Hypertension  Recent office visits:  03-19-2021 [name], [place]. Shingrix vaccine given  Recent consult visits:  None  Hospital visits:  None in previous 6 months  Medications: Outpatient Encounter Medications as of 04/03/2021  Medication Sig   Accu-Chek Softclix Lancets lancets USE UP TO 4 TIMES DAILY AS DIRECTED   albuterol (VENTOLIN HFA) 108 (90 Base) MCG/ACT inhaler Inhale 2 puffs into the lungs every 6 (six) hours as needed for wheezing or shortness of breath.   amLODipine (NORVASC) 10 MG tablet TAKE 1 TABLET BY MOUTH EVERY DAY   aspirin EC 81 MG tablet Take 81 mg by mouth daily.   atorvastatin (LIPITOR) 80 MG tablet Take 1 tablet by mouth at bedtime   benzonatate (TESSALON PERLES) 100 MG capsule Take 1 capsule (100 mg total) by mouth 3 (three) times daily as needed for cough.   blood glucose meter kit and supplies KIT Dispense based on patient and insurance preference. Use up to four times daily as directed. (FOR ICD-9 250.00, 250.01).   brinzolamide (AZOPT) 1 % ophthalmic suspension Place 1 drop into both eyes 3 (three) times daily.   Cholecalciferol 125 MCG (5000 UT) TABS Take 1 tablet by mouth daily.   empagliflozin (JARDIANCE) 10 MG TABS tablet Take 1 tablet (10 mg total) by mouth daily before breakfast.   ezetimibe (ZETIA) 10 MG tablet TAKE 1 TABLET BY MOUTH EVERY DAY   furosemide (LASIX) 40 MG tablet TAKE 1 TABLET BY MOUTH EVERY DAY   insulin degludec (TRESIBA FLEXTOUCH) 100 UNIT/ML FlexTouch Pen Inject 10 Units into the skin daily.   Insulin Pen Needle (BD PEN NEEDLE NANO U/F) 32G X 4 MM MISC USE NIGHTLY AS DIRECTED   Latanoprostene Bunod 0.024 % SOLN Apply 1 drop to eye daily.   levothyroxine (SYNTHROID) 50 MCG tablet TAKE 1 TABLET BY MOUTH EVERY DAY BEFORE BREAKFAST   LINZESS 145 MCG CAPS capsule TAKE 1 CAPSULE  (145 MCG TOTAL) BY MOUTH DAILY BEFORE BREAKFAST. FOR CONSTIPATION   losartan (COZAAR) 50 MG tablet Take 50 mg by mouth at bedtime.   Magnesium 250 MG TABS Take 1 tablet (250 mg total) by mouth every evening. Take with evening meal   nebivolol (BYSTOLIC) 2.5 MG tablet Take 1 tablet (2.5 mg total) by mouth daily.   nystatin (NYSTATIN) powder Apply 1 application topically 3 (three) times daily.   ONETOUCH VERIO test strip CHECK BLOOD SUGARS ONCE DAILY   OZEMPIC, 0.25 OR 0.5 MG/DOSE, 2 MG/1.5ML SOPN INJECT 0.25 MG INTO THE SKIN ONCE A WEEK.   traMADol-acetaminophen (ULTRACET) 37.5-325 MG tablet Take 1 tablet by mouth every 6 (six) hours as needed for moderate pain (prn back pain).   No facility-administered encounter medications on file as of 04/03/2021.   Reviewed chart prior to disease state call. Spoke with patient regarding BP  Recent Office Vitals: BP Readings from Last 3 Encounters:  03/19/21 134/80  02/12/21 (!) 142/70  11/06/20 140/80   Pulse Readings from Last 3 Encounters:  03/19/21 75  02/12/21 78  11/06/20 77    Wt Readings from Last 3 Encounters:  03/19/21 299 lb 3.2 oz (135.7 kg)  02/12/21 (!) 305 lb 9.6 oz (138.6 kg)  11/06/20 297 lb 12.8 oz (135.1 kg)     Kidney Function Lab Results  Component Value Date/Time   CREATININE 1.60 (H) 11/06/2020 12:02 PM   CREATININE  1.31 (H) 07/11/2020 11:31 AM   CREATININE 1.41 (H) 01/18/2016 08:22 AM   CREATININE 2.62 (H) 12/28/2015 10:27 AM   GFRNONAA 30 (L) 11/06/2020 12:02 PM   GFRAA 34 (L) 11/06/2020 12:02 PM    BMP Latest Ref Rng & Units 11/06/2020 07/11/2020 06/07/2020  Glucose 65 - 99 mg/dL 146(H) 188(H) -  BUN 8 - 27 mg/dL _0 Creatinine 0.57 - 1.00 mg/dL 1.60(H) 1.31(H) 1.2(A)  BUN/Creat Ratio 12 - _1 -  Sodium 134 - 144 mmol/L 141 144 137  Potassium 3.5 - 5.2 mmol/L 4.4 4.1 4.4  Chloride 96 - 106 mmol/L 100 101 101  CO2 20 - 29 mmol/L 22 20 27(A)  Calcium 8.7 - 10.3 mg/dL 9.6 10.0 9.9    Current  antihypertensive regimen:  Amlodipine 10 mg daily Losartan 50 mg daily Nebivolol 2.5 mg daily  How often are you checking your Blood Pressure? 3-5x per week  Current home BP readings: 142/72  What recent interventions/DTPs have been made by any provider to improve Blood Pressure control since last CPP Visit:  Any recent hospitalizations or ED visits since last visit with CPP? No  What diet changes have been made to improve Blood Pressure Control?  Patient reports that she does not eat any salt.  What exercise is being done to improve your Blood Pressure Control?  Patient states she tries to walk a few days out of the week.  Adherence Review: Is the patient currently on ACE/ARB medication? Yes Does the patient have >5 day gap between last estimated fill dates? No  NOTES: Patient states all medications are up to date and she feels she is doing well with managing blood pressure. Made patient a telephone appointment with Orlando Penner CPP on 05-14-2021.  Care Gaps: Covid booster overdue Medicare wellness 07-25-2021  Star Rating Drugs: Losartan 50 mg - Last filled 02-21-2021 [facility] Atorvastatin 80 mg- Last filled 02-21-2021 [facility] Ozempic 0.25 mg- Last filled 12-06-2020 [facility] Jardiance 10 mg- Last filled 02-13-2021 [facility]  [place] Clinical Pharmacist Assistant [phone]

## 2021-04-22 ENCOUNTER — Telehealth: Payer: Self-pay | Admitting: Nurse Practitioner

## 2021-04-22 NOTE — Telephone Encounter (Signed)
Tried calling patient  no answer Patient has AWV scheduled 07/25/21  I wanted to see if patient could schedule sooner  Patient can have AWV calendar year per [facility]

## 2021-04-24 ENCOUNTER — Telehealth: Payer: Medicare Other

## 2021-04-24 ENCOUNTER — Telehealth: Payer: Self-pay

## 2021-04-24 ENCOUNTER — Ambulatory Visit (INDEPENDENT_AMBULATORY_CARE_PROVIDER_SITE_OTHER): Payer: Medicare Other

## 2021-04-24 DIAGNOSIS — N1831 Chronic kidney disease, stage 3a: Secondary | ICD-10-CM | POA: Diagnosis not present

## 2021-04-24 DIAGNOSIS — E0822 Diabetes mellitus due to underlying condition with diabetic chronic kidney disease: Secondary | ICD-10-CM | POA: Diagnosis not present

## 2021-04-24 DIAGNOSIS — I10 Essential (primary) hypertension: Secondary | ICD-10-CM | POA: Diagnosis not present

## 2021-04-24 DIAGNOSIS — E782 Mixed hyperlipidemia: Secondary | ICD-10-CM | POA: Diagnosis not present

## 2021-04-24 DIAGNOSIS — Z794 Long term (current) use of insulin: Secondary | ICD-10-CM

## 2021-04-24 NOTE — Chronic Care Management (AMB) (Signed)
Chronic Care Management Pharmacy Assistant   Name: Sarah Weaver  MRN: [id number] DOB: 01-18-1936  Reason for Encounter: Disease State/ Hypertension  Recent office visits:  04-24-2021 Lynne Logan, RN (CCM)  Recent consult visits:  None  Hospital visits:  None in previous 6 months  Medications: Outpatient Encounter Medications as of 04/24/2021  Medication Sig   Accu-Chek Softclix Lancets lancets USE UP TO 4 TIMES DAILY AS DIRECTED   albuterol (VENTOLIN HFA) 108 (90 Base) MCG/ACT inhaler Inhale 2 puffs into the lungs every 6 (six) hours as needed for wheezing or shortness of breath.   amLODipine (NORVASC) 10 MG tablet TAKE 1 TABLET BY MOUTH EVERY DAY   aspirin EC 81 MG tablet Take 81 mg by mouth daily.   atorvastatin (LIPITOR) 80 MG tablet Take 1 tablet by mouth at bedtime   benzonatate (TESSALON PERLES) 100 MG capsule Take 1 capsule (100 mg total) by mouth 3 (three) times daily as needed for cough.   blood glucose meter kit and supplies KIT Dispense based on patient and insurance preference. Use up to four times daily as directed. (FOR ICD-9 250.00, 250.01).   brinzolamide (AZOPT) 1 % ophthalmic suspension Place 1 drop into both eyes 3 (three) times daily.   Cholecalciferol 125 MCG (5000 UT) TABS Take 1 tablet by mouth daily.   empagliflozin (JARDIANCE) 10 MG TABS tablet Take 1 tablet (10 mg total) by mouth daily before breakfast.   ezetimibe (ZETIA) 10 MG tablet TAKE 1 TABLET BY MOUTH EVERY DAY   furosemide (LASIX) 40 MG tablet TAKE 1 TABLET BY MOUTH EVERY DAY   insulin degludec (TRESIBA FLEXTOUCH) 100 UNIT/ML FlexTouch Pen Inject 10 Units into the skin daily.   Insulin Pen Needle (BD PEN NEEDLE NANO U/F) 32G X 4 MM MISC USE NIGHTLY AS DIRECTED   Latanoprostene Bunod 0.024 % SOLN Apply 1 drop to eye daily.   levothyroxine (SYNTHROID) 50 MCG tablet TAKE 1 TABLET BY MOUTH EVERY DAY BEFORE BREAKFAST   LINZESS 145 MCG CAPS capsule TAKE 1 CAPSULE (145 MCG TOTAL) BY  MOUTH DAILY BEFORE BREAKFAST. FOR CONSTIPATION   losartan (COZAAR) 50 MG tablet Take 50 mg by mouth at bedtime.   Magnesium 250 MG TABS Take 1 tablet (250 mg total) by mouth every evening. Take with evening meal   nebivolol (BYSTOLIC) 2.5 MG tablet Take 1 tablet (2.5 mg total) by mouth daily.   nystatin (NYSTATIN) powder Apply 1 application topically 3 (three) times daily.   ONETOUCH VERIO test strip CHECK BLOOD SUGARS ONCE DAILY   OZEMPIC, 0.25 OR 0.5 MG/DOSE, 2 MG/1.5ML SOPN INJECT 0.25 MG INTO THE SKIN ONCE A WEEK.   traMADol-acetaminophen (ULTRACET) 37.5-325 MG tablet Take 1 tablet by mouth every 6 (six) hours as needed for moderate pain (prn back pain).   No facility-administered encounter medications on file as of 04/24/2021.   Reviewed chart prior to disease state call. Spoke with patient regarding BP  Recent Office Vitals: BP Readings from Last 3 Encounters:  03/19/21 134/80  02/12/21 (!) 142/70  11/06/20 140/80   Pulse Readings from Last 3 Encounters:  03/19/21 75  02/12/21 78  11/06/20 77    Wt Readings from Last 3 Encounters:  03/19/21 299 lb 3.2 oz (135.7 kg)  02/12/21 (!) 305 lb 9.6 oz (138.6 kg)  11/06/20 297 lb 12.8 oz (135.1 kg)     Kidney Function Lab Results  Component Value Date/Time   CREATININE 1.60 (H) 11/06/2020 12:02 PM   CREATININE 1.31 (H)  07/11/2020 11:31 AM   CREATININE 1.41 (H) 01/18/2016 08:22 AM   CREATININE 2.62 (H) 12/28/2015 10:27 AM   GFRNONAA 30 (L) 11/06/2020 12:02 PM   GFRAA 34 (L) 11/06/2020 12:02 PM    BMP Latest Ref Rng & Units 11/06/2020 07/11/2020 06/07/2020  Glucose 65 - 99 mg/dL 146(H) 188(H) -  BUN 8 - 27 mg/dL _0 Creatinine 0.57 - 1.00 mg/dL 1.60(H) 1.31(H) 1.2(A)  BUN/Creat Ratio 12 - _1 -  Sodium 134 - 144 mmol/L 141 144 137  Potassium 3.5 - 5.2 mmol/L 4.4 4.1 4.4  Chloride 96 - 106 mmol/L 100 101 101  CO2 20 - 29 mmol/L 22 20 27(A)  Calcium 8.7 - 10.3 mg/dL 9.6 10.0 9.9    Current antihypertensive  regimen:  Amlodipine 10 mg daily Losartan 50 mg daily Nebivolol 2.5 mg daily  How often are you checking your Blood Pressure? 3-5x per week  Current home BP readings: 142/68  What recent interventions/DTPs have been made by any provider to improve Blood Pressure control since last CPP Visit:  Educated on BP goals and benefits of medications for prevention of heart attack, stroke and kidney damage Daily salt intake goal < 2300 mg Importance of home blood pressure monitoring Proper BP monitoring technique  Any recent hospitalizations or ED visits since last visit with CPP? No  What diet changes have been made to improve Blood Pressure Control?  Patient reports that she does not eat any salt  What exercise is being done to improve your Blood Pressure Control?  Patient states she tries to walk a few days out of the week.  Adherence Review: Is the patient currently on ACE/ARB medication? Yes Does the patient have >5 day gap between last estimated fill dates? No  NOTES: Patient states she is doing well with managing medications. Canceled patient's appointment on 05-14-2021 per Orlando Penner CPP. Patient has appointment scheduled in October  Care Gaps: Covid booster overdue Medicare wellness 07-25-2021  Star Rating Drugs: Losartan 50 mg - Last filled 02-21-2021 [facility] Atorvastatin 80 mg- Last filled 02-21-2021 [facility] Ozempic 0.25 mg- Last filled 12-06-2020 [facility] Jardiance 10 mg- Last filled 02-13-2021 [facility]  [place] Clinical Pharmacist Assistant [phone]

## 2021-04-25 DIAGNOSIS — H401131 Primary open-angle glaucoma, bilateral, mild stage: Secondary | ICD-10-CM | POA: Diagnosis not present

## 2021-04-29 NOTE — Patient Instructions (Signed)
Goals Addressed      Chronic Kidney disease progression minimized or avoided   On track    Timeframe:  Long-Range Goal Priority:  High Start Date:  12/21/20                           Expected End Date: 12/21/21       Next Scheduled follow up: 07/22/21  Self Care Activities:  Continue to adhere to MD recommendations for CKD  Continue to keep all scheduled follow up appointments Take medications as directed  Let your healthcare team know if you are unable to take your medications Call your pharmacy for refills at least 7 days prior to running out of medication Increase water intake to 64 oz daily unless otherwise directed  Patient Goals: -maintain or improve adequate kidney function                     Monitor and Manage My Blood Sugar-Diabetes Type 2   On track    Timeframe:  Long-Range Goal Priority:  High Start Date:  12/21/20                           Expected End Date: 12/21/21                    Follow Up Date: 07/22/21   - check blood sugar at prescribed times - check blood sugar before and after exercise - check blood sugar if I feel it is too high or too low - enter blood sugar readings and medication or insulin into daily log - take the blood sugar log to all doctor visits - take the blood sugar meter to all doctor visits    Why is this important?   Checking your blood sugar at home helps to keep it from getting very high or very low.  Writing the results in a diary or log helps the doctor know how to care for you.  Your blood sugar log should have the time, date and the results.  Also, write down the amount of insulin or other medicine that you take.  Other information, like what you ate, exercise done and how you were feeling, will also be helpful.     Notes:      Obtain Eye Exam-Diabetes Type 2       Timeframe:  Long-Range Goal Priority:  Medium Start Date: 12/21/20                             Expected End Date: 12/21/21                     Follow Up Date: 07/22/21     - keep appointment with eye doctor - schedule appointment with eye doctor    Why is this important?   Eye check-ups are important when you have diabetes.  Vision loss can be prevented.    Notes:      Perform Foot Care-Diabetes Type 2       Timeframe:  Long-Range Goal Priority:  Medium Start Date:  12/21/20                           Expected End Date:  12/21/21  Follow Up Date: 07/22/21    - check feet daily for cuts, sores or redness - keep feet up while sitting - trim toenails straight across - wash and dry feet carefully every day - wear comfortable, cotton socks - wear comfortable, well-fitting shoes    Why is this important?   Good foot care is very important when you have diabetes.  There are many things you can do to keep your feet healthy and catch a problem early.    Notes:      Track and Manage My Blood Pressure-Hypertension   On track    Timeframe:  Long-Range Goal Priority:  High Start Date: 12/21/20                            Expected End Date: 12/21/21                      Follow Up Date: 07/22/21   Self-Care Activities: - Self administers medications as prescribed Attends all scheduled provider appointments Calls provider office for new concerns, questions, or BP outside discussed parameters Checks BP and records as discussed Follows a low sodium diet/DASH diet Patient Goals: - check blood pressure 3 times per week - choose a place to take my blood pressure (home, clinic or office, retail store) - write blood pressure results in a log or diary - learn about high blood pressure/review printed patient educational materials related to High Blood Pressure    Why is this important?   You won't feel high blood pressure, but it can still hurt your blood vessels.  High blood pressure can cause heart or kidney problems. It can also cause a stroke.  Making lifestyle changes like losing a Ellsie Violette weight or eating less salt will help.  Checking your  blood pressure at home and at different times of the day can help to control blood pressure.  If the doctor prescribes medicine remember to take it the way the doctor ordered.  Call the office if you cannot afford the medicine or if there are questions about it.     Notes:

## 2021-04-29 NOTE — Chronic Care Management (AMB) (Signed)
Chronic Care Management   CCM RN Visit Note  04/24/2021 Name: Sarah Weaver MRN: [id number] DOB: [birth year]  Subjective: Sarah Weaver is a 85 y.o. year old female who is a primary care patient of [name], [place]. The care management team was consulted for assistance with disease management and care coordination needs.    Engaged with patient by telephone for follow up visit in response to provider referral for case management and/or care coordination services.   Consent to Services:  The patient was given information about Chronic Care Management services, agreed to services, and gave verbal consent prior to initiation of services.  Please see initial visit note for detailed documentation.   Patient agreed to services and verbal consent obtained.   Assessment: Review of patient past medical history, allergies, medications, health status, including review of consultants reports, laboratory and other test data, was performed as part of comprehensive evaluation and provision of chronic care management services.   SDOH (Social Determinants of Health) assessments and interventions performed: Yes, no acute challenges    CCM Care Plan  Allergies  Allergen Reactions   Carvedilol Cough    Pt reports causes her to cough   Lisinopril Cough    REPORTS CAUSES DRY COUGH    Outpatient Encounter Medications as of 04/24/2021  Medication Sig   Accu-Chek Softclix Lancets lancets USE UP TO 4 TIMES DAILY AS DIRECTED   albuterol (VENTOLIN HFA) 108 (90 Base) MCG/ACT inhaler Inhale 2 puffs into the lungs every 6 (six) hours as needed for wheezing or shortness of breath.   amLODipine (NORVASC) 10 MG tablet TAKE 1 TABLET BY MOUTH EVERY DAY   aspirin EC 81 MG tablet Take 81 mg by mouth daily.   atorvastatin (LIPITOR) 80 MG tablet Take 1 tablet by mouth at bedtime   benzonatate (TESSALON PERLES) 100 MG capsule Take 1 capsule (100 mg total) by mouth 3 (three) times daily as  needed for cough.   blood glucose meter kit and supplies KIT Dispense based on patient and insurance preference. Use up to four times daily as directed. (FOR ICD-9 250.00, 250.01).   brinzolamide (AZOPT) 1 % ophthalmic suspension Place 1 drop into both eyes 3 (three) times daily.   Cholecalciferol 125 MCG (5000 UT) TABS Take 1 tablet by mouth daily.   empagliflozin (JARDIANCE) 10 MG TABS tablet Take 1 tablet (10 mg total) by mouth daily before breakfast.   ezetimibe (ZETIA) 10 MG tablet TAKE 1 TABLET BY MOUTH EVERY DAY   furosemide (LASIX) 40 MG tablet TAKE 1 TABLET BY MOUTH EVERY DAY   insulin degludec (TRESIBA FLEXTOUCH) 100 UNIT/ML FlexTouch Pen Inject 10 Units into the skin daily.   Insulin Pen Needle (BD PEN NEEDLE NANO U/F) 32G X 4 MM MISC USE NIGHTLY AS DIRECTED   Latanoprostene Bunod 0.024 % SOLN Apply 1 drop to eye daily.   levothyroxine (SYNTHROID) 50 MCG tablet TAKE 1 TABLET BY MOUTH EVERY DAY BEFORE BREAKFAST   LINZESS 145 MCG CAPS capsule TAKE 1 CAPSULE (145 MCG TOTAL) BY MOUTH DAILY BEFORE BREAKFAST. FOR CONSTIPATION   losartan (COZAAR) 50 MG tablet Take 50 mg by mouth at bedtime.   Magnesium 250 MG TABS Take 1 tablet (250 mg total) by mouth every evening. Take with evening meal   nebivolol (BYSTOLIC) 2.5 MG tablet Take 1 tablet (2.5 mg total) by mouth daily.   nystatin (NYSTATIN) powder Apply 1 application topically 3 (three) times daily.   ONETOUCH VERIO test strip CHECK BLOOD SUGARS ONCE DAILY  OZEMPIC, 0.25 OR 0.5 MG/DOSE, 2 MG/1.5ML SOPN INJECT 0.25 MG INTO THE SKIN ONCE A WEEK.   traMADol-acetaminophen (ULTRACET) 37.5-325 MG tablet Take 1 tablet by mouth every 6 (six) hours as needed for moderate pain (prn back pain).   No facility-administered encounter medications on file as of 04/24/2021.    Patient Active Problem List   Diagnosis Date Noted   Class 3 severe obesity due to excess calories with serious comorbidity in adult [facility]) 08/25/2019   Diabetes mellitus due to  underlying condition with stage 3a chronic kidney disease, with long-term current use of insulin ([place]) 06/16/2019   CAD (coronary artery disease) 07/26/2018   Chronic diastolic CHF (congestive heart failure) ([place]) 07/26/2018   Chronic kidney disease (CKD), stage III (moderate) ([facility]) 07/26/2018   Chest pain, moderate coronary artery risk 12/20/2015   DM2 (diabetes mellitus, type 2) ([place]) 12/20/2015   HTN (hypertension) 12/20/2015   HLD (hyperlipidemia) 12/20/2015   Ischemic chest pain ([place]) 12/20/2015   NSTEMI (non-ST elevated myocardial infarction) ([place])    Hypertensive heart disease without heart failure    Acute renal failure ([place])    Cholelithiasis 04/14/2011    Conditions to be addressed/monitored: HTN, HLD, DM II, Stage 3a Chronic Kidney disease  Care Plan : Diabetes Type 2 (Adult)  Updates made by Lynne Logan, RN since 04/24/2021 12:00 AM     Problem: Glycemic Management (Diabetes, Type 2)   Priority: High     Long-Range Goal: Glycemic Management Optimized   Start Date: 12/21/2020  Expected End Date: 12/21/2021  Recent Progress: On track  Priority: High  Note:   Objective:  Lab Results  Component Value Date   HGBA1C 7.3 (H) 11/06/2020   Lab Results  Component Value Date   CREATININE 1.60 (H) 11/06/2020   CREATININE 1.31 (H) 07/11/2020   CREATININE 1.2 (A) 06/07/2020  No results found for: EGFR Current Barriers:  Knowledge Deficits related to basic Diabetes pathophysiology and self care/management Knowledge Deficits related to medications used for management of diabetes Case Manager Clinical Goal(s):  patient will demonstrate improved adherence to prescribed treatment plan for diabetes self care/management as evidenced by: daily monitoring and recording of CBG  adherence to ADA/ carb modified diet exercise 5 days/week adherence to prescribed medication regimen contacting provider for new or worsened symptoms or questions Interventions:  04/24/21 completed successful  outbound call with patient  Collaboration with [name], [place] regarding development and update of comprehensive plan of care as evidenced by provider attestation and co-signature Inter-disciplinary care team collaboration (see longitudinal plan of care) Provided education to patient about basic DM disease process Review of patient status, including review of consultants reports, relevant laboratory and other test results, and medications completed. Reviewed medications with patient and discussed importance of medication adherence Provided patient with written educational materials related to hypo and hyperglycemia and importance of correct treatment Advised patient, providing education and rationale, to check cbg daily before meals and at bedtime  and record, calling PCP and or CCM team  for findings outside established parameters.   Mailed printed educational materials related to Diabetes Management  Discussed plans with patient for ongoing care management follow up and provided patient with direct contact information for care management team Self-Care Activities Self administers oral medications as prescribed Self administers insulin as prescribed Self administers injectable DM medication (Ozempic) as prescribed Attends all scheduled provider appointments Checks blood sugars as prescribed and utilize hyper and hypoglycemia protocol as needed Adheres to prescribed ADA/carb modified Patient Goals: -  check blood sugar at prescribed times - check blood sugar before and after exercise - check blood sugar if I feel it is too high or too low - enter blood sugar readings and medication or insulin into daily log - take the blood sugar log to all doctor visits - take the blood sugar meter to all doctor visits - keep appointment with eye doctor - schedule appointment with eye doctor - check feet daily for cuts, sores or redness - keep feet up while sitting - trim toenails straight across -  wash and dry feet carefully every day - wear comfortable, cotton socks - wear comfortable, well-fitting shoes  Follow Up Plan: Telephone follow up appointment with care management team member scheduled for: 07/22/21    Care Plan : Hypertension (Adult)  Updates made by Lynne Logan, RN since 04/24/2021 12:00 AM     Problem: Disease Progression (Hypertension)   Priority: High     Long-Range Goal: Disease Progression Prevented or Minimized   Start Date: 12/21/2020  Expected End Date: 12/21/2021  Recent Progress: On track  Priority: High  Note:   Objective:  Last practice recorded BP readings:  BP Readings from Last 3 Encounters:  03/19/21 134/80  02/12/21 (!) 142/70  11/06/20 140/80   Most recent eGFR/CrCl: No results found for: EGFR  No components found for: CRCL Current Barriers:  Knowledge Deficits related to basic understanding of hypertension pathophysiology and self care management Knowledge Deficits related to understanding of medications prescribed for management of hypertension Case Manager Clinical Goal(s):  patient will demonstrate improved adherence to prescribed treatment plan for hypertension as evidenced by taking all medications as prescribed, monitoring and recording blood pressure as directed, adhering to low sodium/DASH diet Interventions:  04/24/21 completed successful outbound call with patient  Collaboration with [name], FNP regarding development and update of comprehensive plan of care as evidenced by provider attestation and co-signature Inter-disciplinary care team collaboration (see longitudinal plan of care) Evaluation of current treatment plan related to hypertension self management and patient's adherence to plan as established by provider. Provided education to patient re: stroke prevention, s/s of heart attack and stroke, DASH diet, complications of uncontrolled blood pressure Reviewed medications with patient and discussed importance of  compliance Advised patient, providing education and rationale, to monitor blood pressure daily and record, calling PCP for findings outside established parameters.  Mailed printed educational materials related to What is High Blood Pressure?; Why Should I Lower Sodium? Discussed plans with patient for ongoing care management follow up and provided patient with direct contact information for care management team Self-Care Activities: Self administers medications as prescribed Attends all scheduled provider appointments Calls provider office for new concerns, questions, or BP outside discussed parameters Checks BP and records as discussed Follows a low sodium diet/DASH diet Patient Goals: - check blood pressure 3 times per week - choose a place to take my blood pressure (home, clinic or office, retail store) - write blood pressure results in a log or diary - learn about high blood pressure/review printed patient educational materials related to High Blood Pressure   Follow Up Plan: Telephone follow up appointment with care management team member scheduled for: 07/22/21    Care Plan : Chronic Kidney Disease  Updates made by Lynne Logan, RN since 04/24/2021 12:00 AM     Problem: Chronic Kidney Disease   Priority: High     Long-Range Goal: Chronic Kidney Disease progression minimized or avoided   Start Date: 12/21/2020  Expected End  Date: 12/21/2021  Recent Progress: On track  Priority: High  Note:   Current Barriers:  Ineffective Self Health Maintenance  Clinical Goal(s):  Collaboration with [name], FNP regarding development and update of comprehensive plan of care as evidenced by provider attestation and co-signature Inter-disciplinary care team collaboration (see longitudinal plan of care) patient will work with care management team to address care coordination and chronic disease management needs related to Disease Management Educational Needs Care Coordination Medication  Management and Education Psychosocial Support   Interventions:  04/24/21 completed successful outbound call with patient  Evaluation of current treatment plan related to CKD Stage III , self-management and patient's adherence to plan as established by provider. Collaboration with [name], FNP regarding development and update of comprehensive plan of care as evidenced by provider attestation       and co-signature Inter-disciplinary care team collaboration (see longitudinal plan of care) Provided education to patient about basic disease process related to CKD Review of patient status, including review of consultants reports, relevant laboratory and other test results, and medications completed. Reviewed medications with patient and discussed importance of medication adherence Mailed printed educational materials related to Eating Right with Kidney disease  Discussed plans with patient for ongoing care management follow up and provided patient with direct contact information for care management team Self Care Activities:  Continue to adhere to MD recommendations for CKD  Continue to keep all scheduled follow up appointments Take medications as directed  Let your healthcare team know if you are unable to take your medications Call your pharmacy for refills at least 7 days prior to running out of medication Increase water intake to 64 oz daily unless otherwise directed  Patient Goals: -maintain or improve adequate kidney function  Follow Up Plan: Telephone follow up appointment with care management team member scheduled for: 07/22/21     Plan:Telephone follow up appointment with care management team member scheduled for:  07/22/21  Barb Merino, RN, BSN, CCM Care Management Coordinator [place] [facility]  Direct Phone: [phone]

## 2021-05-02 ENCOUNTER — Other Ambulatory Visit: Payer: Self-pay | Admitting: Nurse Practitioner

## 2021-05-05 ENCOUNTER — Other Ambulatory Visit: Payer: Self-pay | Admitting: Nurse Practitioner

## 2021-05-13 ENCOUNTER — Other Ambulatory Visit: Payer: Self-pay | Admitting: *Deleted

## 2021-05-13 MED ORDER — FUROSEMIDE 40 MG PO TABS: 40.0000 mg | ORAL_TABLET | Freq: Every day | ORAL | 3 refills | Status: DC

## 2021-05-14 ENCOUNTER — Telehealth: Payer: Self-pay

## 2021-05-30 ENCOUNTER — Telehealth: Payer: Self-pay

## 2021-05-30 NOTE — Chronic Care Management (AMB) (Signed)
  Chronic Care Management Pharmacy Assistant   Name: Sarah Weaver  MRN: [id number] DOB: 04/10/1936  Reason for Encounter: Disease State/ General  Recent office visits:  None  Recent consult visits:  None  Hospital visits:  None in previous 6 months  Medications: Outpatient Encounter Medications as of 05/30/2021  Medication Sig   Accu-Chek Softclix Lancets lancets USE UP TO 4 TIMES DAILY AS DIRECTED   albuterol (VENTOLIN HFA) 108 (90 Base) MCG/ACT inhaler Inhale 2 puffs into the lungs every 6 (six) hours as needed for wheezing or shortness of breath.   amLODipine (NORVASC) 10 MG tablet TAKE 1 TABLET BY MOUTH EVERY DAY   aspirin EC 81 MG tablet Take 81 mg by mouth daily.   atorvastatin (LIPITOR) 80 MG tablet Take 1 tablet by mouth at bedtime   benzonatate (TESSALON PERLES) 100 MG capsule Take 1 capsule (100 mg total) by mouth 3 (three) times daily as needed for cough.   blood glucose meter kit and supplies KIT Dispense based on patient and insurance preference. Use up to four times daily as directed. (FOR ICD-9 250.00, 250.01).   brinzolamide (AZOPT) 1 % ophthalmic suspension Place 1 drop into both eyes 3 (three) times daily.   Cholecalciferol 125 MCG (5000 UT) TABS Take 1 tablet by mouth daily.   empagliflozin (JARDIANCE) 10 MG TABS tablet Take 1 tablet (10 mg total) by mouth daily before breakfast.   ezetimibe (ZETIA) 10 MG tablet TAKE 1 TABLET BY MOUTH EVERY DAY   furosemide (LASIX) 40 MG tablet Take 1 tablet (40 mg total) by mouth daily.   insulin degludec (TRESIBA FLEXTOUCH) 100 UNIT/ML FlexTouch Pen Inject 10 Units into the skin daily.   Insulin Pen Needle (BD PEN NEEDLE NANO U/F) 32G X 4 MM MISC USE NIGHTLY AS DIRECTED   Latanoprostene Bunod 0.024 % SOLN Apply 1 drop to eye daily.   levothyroxine (SYNTHROID) 50 MCG tablet TAKE 1 TABLET BY MOUTH EVERY DAY BEFORE BREAKFAST   LINZESS 145 MCG CAPS capsule TAKE 1 CAPSULE (145 MCG TOTAL) BY MOUTH DAILY BEFORE  BREAKFAST. FOR CONSTIPATION   losartan (COZAAR) 50 MG tablet Take 50 mg by mouth at bedtime.   Magnesium 250 MG TABS Take 1 tablet (250 mg total) by mouth every evening. Take with evening meal   nebivolol (BYSTOLIC) 2.5 MG tablet Take 1 tablet (2.5 mg total) by mouth daily.   nystatin (NYSTATIN) powder Apply 1 application topically 3 (three) times daily.   ONETOUCH VERIO test strip CHECK BLOOD SUGARS ONCE DAILY   OZEMPIC, 0.25 OR 0.5 MG/DOSE, 2 MG/1.5ML SOPN INJECT 0.25 MG INTO THE SKIN ONCE A WEEK.   traMADol-acetaminophen (ULTRACET) 37.5-325 MG tablet Take 1 tablet by mouth every 6 (six) hours as needed for moderate pain (prn back pain).   No facility-administered encounter medications on file as of 05/30/2021.   Recent Relevant Labs: Lab Results  Component Value Date/Time   HGBA1C 7.3 (H) 11/06/2020 12:02 PM   HGBA1C 7.3 (H) 07/11/2020 11:31 AM    Kidney Function Lab Results  Component Value Date/Time   CREATININE 1.60 (H) 11/06/2020 12:02 PM   CREATININE 1.31 (H) 07/11/2020 11:31 AM   CREATININE 1.41 (H) 01/18/2016 08:22 AM   CREATININE 2.62 (H) 12/28/2015 10:27 AM   GFRNONAA 30 (L) 11/06/2020 12:02 PM   GFRAA 34 (L) 11/06/2020 12:02 PM    Current antihyperglycemic regimen:  Jardiance 10 mg tablet once per day with breakfast Ozempic 0.25 mg into the skin once a week on Tuesday   Insulin degludec - inject 10 units into the skin   What recent interventions/DTPs have been made to improve glycemic control:  Educated on Complications of diabetes including kidney damage, retinal damage, and cardiovascular disease Exercise goal of 150 minutes per week Benefits of weight loss Prevention and management of hypoglycemic episodes Counseled to check feet daily and get yearly eye exams Counseled on diet and exercise extensively  Have there been any recent hospitalizations or ED visits since last visit with CPP? No  Patient denies hypoglycemic symptoms  Patient denies hyperglycemic  symptoms  How often are you checking your blood sugar? once daily What are your blood sugars ranging?  Fasting: 128 Before meals: None After meals: None Bedtime: None  During the week, how often does your blood glucose drop below 70? Never  Are you checking your feet daily/regularly? Patient states daily.  Adherence Review: Is the patient currently on a STATIN medication? No Is the patient currently on ACE/ARB medication? Yes Does the patient have >5 day gap between last estimated fill dates? Yes  Reviewed chart prior to disease state call. Spoke with patient regarding BP  Recent Office Vitals: BP Readings from Last 3 Encounters:  03/19/21 134/80  02/12/21 (!) 142/70  11/06/20 140/80   Pulse Readings from Last 3 Encounters:  03/19/21 75  02/12/21 78  11/06/20 77    Wt Readings from Last 3 Encounters:  03/19/21 299 lb 3.2 oz (135.7 kg)  02/12/21 (!) 305 lb 9.6 oz (138.6 kg)  11/06/20 297 lb 12.8 oz (135.1 kg)     Kidney Function Lab Results  Component Value Date/Time   CREATININE 1.60 (H) 11/06/2020 12:02 PM   CREATININE 1.31 (H) 07/11/2020 11:31 AM   CREATININE 1.41 (H) 01/18/2016 08:22 AM   CREATININE 2.62 (H) 12/28/2015 10:27 AM   GFRNONAA 30 (L) 11/06/2020 12:02 PM   GFRAA 34 (L) 11/06/2020 12:02 PM    BMP Latest Ref Rng & Units 11/06/2020 07/11/2020 06/07/2020  Glucose 65 - 99 mg/dL 146(H) 188(H) -  BUN 8 - 27 mg/dL 22 22 15  Creatinine 0.57 - 1.00 mg/dL 1.60(H) 1.31(H) 1.2(A)  BUN/Creat Ratio 12 - 28 14 17 -  Sodium 134 - 144 mmol/L 141 144 137  Potassium 3.5 - 5.2 mmol/L 4.4 4.1 4.4  Chloride 96 - 106 mmol/L 100 101 101  CO2 20 - 29 mmol/L 22 20 27(A)  Calcium 8.7 - 10.3 mg/dL 9.6 10.0 9.9    Current antihypertensive regimen:  Amlodipine 10 mg daily Losartan 50 mg daily Nebivolol 2.5 mg daily  How often are you checking your Blood Pressure? daily  Current home BP readings: 142/65, 148/68  What recent interventions/DTPs have been made by any  provider to improve Blood Pressure control since last CPP Visit:  Educated on BP goals and benefits of medications for prevention of heart attack, stroke and kidney damage Daily salt intake goal < 2300 mg Importance of home blood pressure monitoring Proper BP monitoring technique  Any recent hospitalizations or ED visits since last visit with CPP? No  What diet changes have been made to improve Blood Pressure Control?  Patient states she's limited her salt intake, eats vegetables/fruits daily and drinks plenty of water.  What exercise is being done to improve your Blood Pressure Control?  Patient states she walks some in the house. Patient states she will walk more outside once the weather cools off  Adherence Review: Is the patient currently on ACE/ARB medication? Yes Does the patient have >5 day gap   between last estimated fill dates? Yes  NOTES: Patient states she is waiting on another glucose monitor because she misplaced it when she was out of town last week.   Care Gaps: Covid booster overdue Medicare wellness 07-25-2021  Star Rating Drugs: Losartan 50 mg - Last filled 02-21-2021 [facility] (Patient states she recently received medication) Atorvastatin 80 mg- Last filled 04-30-2021 [facility] Ozempic 0.25 mg- Last filled 05-06-2021 [facility] Jardiance 10 mg- Last filled 05-14-2021 [facility]  [facility] Clinical Pharmacist Assistant [phone]

## 2021-06-15 ENCOUNTER — Other Ambulatory Visit: Payer: Self-pay | Admitting: Nurse Practitioner

## 2021-06-18 ENCOUNTER — Telehealth: Payer: Self-pay

## 2021-06-18 NOTE — Chronic Care Management (AMB) (Signed)
    Called [facility], No answer, was unable to leave a message of appointment on 06-19-2021 at 8:00 via telephone visit with Cherylin Mylar, Pharm D.  Care Gaps: Covid booster overdue last completed 10-17-2020 Medicare wellness 07-25-2021 Shingrix overdue Flu vaccine overdue last completed 07-11-2020 6 month A1C overdue last completed 11-06-2020  Star Rating Drug: Losartan 50 mg - Last filled 05-22-2021 [facility] Atorvastatin 80 mg- Last filled 04-30-2021 [facility] Ozempic 0.25 mg- Last filled 05-06-2021 [facility] Jardiance 10 mg- Last filled 05-14-2021 [facility]  Any gaps in medications fill history? No  Huey Romans [employer] Clinical Pharmacist Assistant [phone]

## 2021-06-19 ENCOUNTER — Telehealth: Payer: Self-pay

## 2021-06-26 ENCOUNTER — Encounter: Payer: Self-pay | Admitting: Nurse Practitioner

## 2021-06-26 ENCOUNTER — Ambulatory Visit (INDEPENDENT_AMBULATORY_CARE_PROVIDER_SITE_OTHER): Payer: Medicare Other | Admitting: Nurse Practitioner

## 2021-06-26 ENCOUNTER — Other Ambulatory Visit: Payer: Self-pay

## 2021-06-26 VITALS — BP 130/72 | HR 82 | Temp 98.2°F | Ht 61.4 in | Wt 308.0 lb

## 2021-06-26 DIAGNOSIS — Z23 Encounter for immunization: Secondary | ICD-10-CM | POA: Diagnosis not present

## 2021-06-26 DIAGNOSIS — I129 Hypertensive chronic kidney disease with stage 1 through stage 4 chronic kidney disease, or unspecified chronic kidney disease: Secondary | ICD-10-CM | POA: Diagnosis not present

## 2021-06-26 DIAGNOSIS — R0609 Other forms of dyspnea: Secondary | ICD-10-CM

## 2021-06-26 DIAGNOSIS — R601 Generalized edema: Secondary | ICD-10-CM

## 2021-06-26 DIAGNOSIS — E782 Mixed hyperlipidemia: Secondary | ICD-10-CM | POA: Diagnosis not present

## 2021-06-26 DIAGNOSIS — I10 Essential (primary) hypertension: Secondary | ICD-10-CM

## 2021-06-26 DIAGNOSIS — N1831 Chronic kidney disease, stage 3a: Secondary | ICD-10-CM | POA: Diagnosis not present

## 2021-06-26 DIAGNOSIS — Z794 Long term (current) use of insulin: Secondary | ICD-10-CM

## 2021-06-26 DIAGNOSIS — R600 Localized edema: Secondary | ICD-10-CM

## 2021-06-26 DIAGNOSIS — E0822 Diabetes mellitus due to underlying condition with diabetic chronic kidney disease: Secondary | ICD-10-CM

## 2021-06-26 MED ORDER — AMLODIPINE BESYLATE 5 MG PO TABS: 5.0000 mg | ORAL_TABLET | Freq: Every day | ORAL | 0 refills | Status: DC

## 2021-06-26 NOTE — Patient Instructions (Addendum)
Take 1/2 of your amlodipine 10 mg tablets until gone then you will get the 5 mg tablet.  Take 80 mg of lasix (furosemide) until Saturday if you are better if not take 80 mg of lasix until Monday, increase water intake and if have lightheaded or dizziness stop taking and call office or go to ER.  Keep feet elevated when possible and wear support or compression socks during the day  Check blood pressure daily if greater than 140/80   Edema Edema is when you have too much fluid in your body or under your skin. Edema may make your legs, feet, and ankles swell up. Swelling is also common in looser tissues, like around your eyes. This is a common condition. It gets more common as you get older. There are many possible causes of edema. Eating too much salt (sodium) and being on your feet or sitting for a long time can cause edema in your legs, feet, and ankles. Hot weather may make edema worse. Edema is usually painless. Your skin may look swollen or shiny. Follow these instructions at home: Keep the swollen body part raised (elevated) above the level of your heart when you are sitting or lying down. Do not sit still or stand for a long time. Do not wear tight clothes. Do not wear garters on your upper legs. Exercise your legs. This can help the swelling go down. Wear elastic bandages or support stockings as told by your doctor. Eat a low-salt (low-sodium) diet to reduce fluid as told by your doctor. Depending on the cause of your swelling, you may need to limit how much fluid you drink (fluid restriction). Take over-the-counter and prescription medicines only as told by your doctor. Contact a doctor if: Treatment is not working. You have heart, liver, or kidney disease and have symptoms of edema. You have sudden and unexplained weight gain. Get help right away if: You have shortness of breath or chest pain. You cannot breathe when you lie down. You have pain, redness, or warmth in the swollen  areas. You have heart, liver, or kidney disease and get edema all of a sudden. You have a fever and your symptoms get worse all of a sudden. Summary Edema is when you have too much fluid in your body or under your skin. Edema may make your legs, feet, and ankles swell up. Swelling is also common in looser tissues, like around your eyes. Raise (elevate) the swollen body part above the level of your heart when you are sitting or lying down. Follow your doctor's instructions about diet and how much fluid you can drink (fluid restriction). This information is not intended to replace advice given to you by your health care provider. Make sure you discuss any questions you have with your health care provide  Document Revised: 06/27/2020 Document Reviewed: 06/27/2020 Elsevier Patient Education  2022 [employer]    Influenza (Flu) Vaccine (Inactivated or Recombinant): What You Need to Know 1. Why get vaccinated? Influenza vaccine can prevent influenza (flu). Flu is a contagious disease that spreads around the [place] every year, usually between October and May. Anyone can get the flu, but it is more dangerous for some people. Infants and young children, people 93 years and older, pregnant people, and people with certain health conditions or a weakened immune system are at greatest risk of flu complications. Pneumonia, bronchitis, sinus infections, and ear infections are examples of flu-related complications. If you have a medical condition, such as heart disease,  cancer, or diabetes, flu can make it worse. Flu can cause fever and chills, sore throat, muscle aches, fatigue, cough, headache, and runny or stuffy nose. Some people may have vomiting and diarrhea, though this is more common in children than adults. In an average year, thousands of people in the [place] die from flu, and many more are hospitalized. Flu vaccine prevents millions of illnesses and flu-related visits to the doctor  each year. 2. Influenza vaccines CDC recommends everyone 6 months and older get vaccinated every flu season. Children 6 months through 18 years of age may need 2 doses during a single flu season. Everyone else needs only 1 dose each flu season. It takes about 2 weeks for protection to develop after vaccination. There are many flu viruses, and they are always changing. Each year a new flu vaccine is made to protect against the influenza viruses believed to be likely to cause disease in the upcoming flu season. Even when the vaccine doesn't exactly match these viruses, it may still provide some protection. Influenza vaccine does not cause flu. Influenza vaccine may be given at the same time as other vaccines. 3. Talk with your health care provider Tell your vaccination provider if the person getting the vaccine: Has had an allergic reaction after a previous dose of influenza vaccine, or has any severe, life-threatening allergies Has ever had Guillain-Barr Syndrome (also called "GBS") In some cases, your health care provider may decide to postpone influenza vaccination until a future visit. Influenza vaccine can be administered at any time during pregnancy. People who are or will be pregnant during influenza season should receive inactivated influenza vaccine. People with minor illnesses, such as a cold, may be vaccinated. People who are moderately or severely ill should usually wait until they recover before getting influenza vaccine. Your health care provider can give you more information. 4. Risks of a vaccine reaction Soreness, redness, and swelling where the shot is given, fever, muscle aches, and headache can happen after influenza vaccination. There may be a very small increased risk of Guillain-Barr Syndrome (GBS) after inactivated influenza vaccine (the flu shot). Young children who get the flu shot along with pneumococcal vaccine (PCV13) and/or DTaP vaccine at the same time might be  slightly more likely to have a seizure caused by fever. Tell your health care provider if a child who is getting flu vaccine has ever had a seizure. People sometimes faint after medical procedures, including vaccination. Tell your provider if you feel dizzy or have vision changes or ringing in the ears. As with any medicine, there is a very remote chance of a vaccine causing a severe allergic reaction, other serious injury, or death. 5. What if there is a serious problem? An allergic reaction could occur after the vaccinated person leaves the clinic. If you see signs of a severe allergic reaction (hives, swelling of the face and throat, difficulty breathing, a fast heartbeat, dizziness, or weakness), call 9-1-1 and get the person to the nearest hospital. For other signs that concern you, call your health care provider. Adverse reactions should be reported to the Vaccine Adverse Event Reporting System (VAERS). Your health care provider will usually file this report, or you can do it yourself. Visit the VAERS website at www.vaers.LAgents.no or call 631-650-4729. VAERS is only for reporting reactions, and VAERS staff members do not give medical advice. 6. The National Vaccine Injury Compensation Program The [employer] Vaccine Injury Compensation Program (VICP) is a federal program that was created to compensate  people who may have been injured by certain vaccines. Claims regarding alleged injury or death due to vaccination have a time limit for filing, which may be as short as two years. Visit the VICP website at SpiritualWord.at or call 937 786 4092 to learn about the program and about filing a claim. 7. How can I learn more? Ask your health care provider. Call your local or state health department. Visit the website of the Food and Drug Administration (FDA) for vaccine package inserts and additional information at FinderList.no. Contact the Centers for  Disease Control and Prevention (CDC): Call (986)631-8718 (1-800-CDC-INFO) or Visit CDC's website at BiotechRoom.com.cy. Vaccine Information Statement Inactivated Influenza Vaccine (04/20/2020) This information is not intended to replace advice given to you by your health care provider. Make sure you discuss any questions you have with your health care provider. Document Revised: 06/07/2020 Document Reviewed: 06/07/2020 Elsevier Patient Education  2022 [employer].

## 2021-06-26 NOTE — Progress Notes (Signed)
I,Sarah Weaver,acting as a Education administrator for [employer], FNP.,have documented all relevant documentation on the behalf of Sarah Brine, FNP,as directed by  Sarah Brine, FNP while in the presence of Sarah Weaver, [place].  This visit occurred during the SARS-CoV-2 public health emergency.  Safety protocols were in place, including screening questions prior to the visit, additional usage of staff PPE, and extensive cleaning of exam room while observing appropriate contact time as indicated for disinfecting solutions.  Subjective:     Patient ID: Sarah Weaver , female    DOB: [birth year] , 85 y.o.   MRN: [id number]   Chief Complaint  Patient presents with   Edema    HPI  Bilateral lower leg edema for the last 2 months. Reports they do not go down when elevating. Does not wear support socks. But will wear diabetic socks.   Diabetes She presents for her follow-up diabetic visit. She has type 2 diabetes mellitus. There are no hypoglycemic associated symptoms. There are no diabetic associated symptoms. Pertinent negatives for diabetes include no fatigue, no polydipsia, no polyphagia, no polyuria and no weakness. There are no hypoglycemic complications. Risk factors for coronary artery disease include diabetes mellitus, obesity and sedentary lifestyle. Current diabetic treatment includes oral agent (dual therapy).    Past Medical History:  Diagnosis Date   Arthritis    "all over"   Chronic diastolic CHF (congestive heart failure) ([place])    Echocardiogram 12/2019: EF 60-65, no RWMA, mild LVH, Gr 1 DD, trivial MR, mild AoV sclerosis, no AS   Chronic lower back pain    CKD (chronic kidney disease), stage III (HCC)    Coronary artery disease    a. status post DES to the LAD 12/2015.   Glaucoma    Glaucoma of both eyes    Hyperkalemia    Hyperlipidemia    Hypertension    Type II diabetes mellitus ([place])      Family History  Problem Relation Age of Onset   Heart disease Mother     Heart attack Mother    Hypertension Mother    Heart disease Sister    Cancer Sister    Stroke Sister      Current Outpatient Medications:    Accu-Chek Softclix Lancets lancets, USE UP TO 4 TIMES DAILY AS DIRECTED, Disp: 100 each, Rfl: 3   albuterol (VENTOLIN HFA) 108 (90 Base) MCG/ACT inhaler, Inhale 2 puffs into the lungs every 6 (six) hours as needed for wheezing or shortness of breath., Disp: 8 g, Rfl: 2   amLODipine (NORVASC) 5 MG tablet, Take 1 tablet (5 mg total) by mouth daily., Disp: 90 tablet, Rfl: 0   aspirin EC 81 MG tablet, Take 81 mg by mouth daily., Disp: , Rfl:    atorvastatin (LIPITOR) 80 MG tablet, Take 1 tablet by mouth at bedtime, Disp: 90 tablet, Rfl: 2   benzonatate (TESSALON PERLES) 100 MG capsule, Take 1 capsule (100 mg total) by mouth 3 (three) times daily as needed for cough., Disp: 30 capsule, Rfl: 1   blood glucose meter kit and supplies KIT, Dispense based on patient and insurance preference. Use up to four times daily as directed. (FOR ICD-9 250.00, 250.01)., Disp: 1 each, Rfl: 0   brinzolamide (AZOPT) 1 % ophthalmic suspension, Place 1 drop into both eyes 3 (three) times daily., Disp: , Rfl:    Cholecalciferol 125 MCG (5000 UT) TABS, Take 1 tablet by mouth daily., Disp: , Rfl:    empagliflozin (JARDIANCE) 10 MG TABS tablet,  Take 1 tablet (10 mg total) by mouth daily before breakfast., Disp: 90 tablet, Rfl: 3   ezetimibe (ZETIA) 10 MG tablet, TAKE 1 TABLET BY MOUTH EVERY DAY, Disp: 90 tablet, Rfl: 3   furosemide (LASIX) 40 MG tablet, Take 1 tablet (40 mg total) by mouth daily., Disp: 90 tablet, Rfl: 3   insulin degludec (TRESIBA FLEXTOUCH) 100 UNIT/ML FlexTouch Pen, Inject 15 Units into the skin daily., Disp: 15 mL, Rfl: 3   Insulin Pen Needle (BD PEN NEEDLE NANO U/F) 32G X 4 MM MISC, USE NIGHTLY AS DIRECTED, Disp: 100 each, Rfl: 3   Latanoprostene Bunod 0.024 % SOLN, Apply 1 drop to eye daily., Disp: , Rfl:    levothyroxine (SYNTHROID) 50 MCG tablet, TAKE 1  TABLET BY MOUTH EVERY DAY BEFORE BREAKFAST, Disp: 90 tablet, Rfl: 1   LINZESS 145 MCG CAPS capsule, TAKE 1 CAPSULE (145 MCG TOTAL) BY MOUTH DAILY BEFORE BREAKFAST. FOR CONSTIPATION, Disp: 90 capsule, Rfl: 0   losartan (COZAAR) 50 MG tablet, Take 50 mg by mouth at bedtime., Disp: , Rfl:    Magnesium 250 MG TABS, Take 1 tablet (250 mg total) by mouth every evening. Take with evening meal, Disp: 90 tablet, Rfl: 1   nebivolol (BYSTOLIC) 2.5 MG tablet, Take 1 tablet (2.5 mg total) by mouth daily., Disp: 90 tablet, Rfl: 1   nystatin (NYSTATIN) powder, Apply 1 application topically 3 (three) times daily., Disp: 15 g, Rfl: 0   ONETOUCH VERIO test strip, CHECK BLOOD SUGARS ONCE DAILY, Disp: 50 strip, Rfl: 11   OZEMPIC, 0.25 OR 0.5 MG/DOSE, 2 MG/1.5ML SOPN, INJECT 0.25 MG INTO THE SKIN ONCE A WEEK., Disp: 3 mL, Rfl: 3   traMADol-acetaminophen (ULTRACET) 37.5-325 MG tablet, Take 1 tablet by mouth every 6 (six) hours as needed for moderate pain (prn back pain)., Disp: 30 tablet, Rfl: 0   Allergies  Allergen Reactions   Carvedilol Cough    Pt reports causes her to cough   Lisinopril Cough    REPORTS CAUSES DRY COUGH     Review of Systems  Constitutional: Negative.  Negative for fatigue.  Respiratory: Negative.    Cardiovascular:  Positive for leg swelling.  Gastrointestinal: Negative.   Endocrine: Negative for polydipsia, polyphagia and polyuria.  Neurological: Negative.  Negative for weakness.    Today's Vitals   06/26/21 1620  BP: 130/72  Pulse: 82  Temp: 98.2 F (36.8 C)  TempSrc: Oral  Weight: (!) 308 lb (139.7 kg)  Height: 5' 1.4" (1.56 m)   Body mass index is 57.44 kg/m.  Wt Readings from Last 3 Encounters:  06/26/21 (!) 308 lb (139.7 kg)  03/19/21 299 lb 3.2 oz (135.7 kg)  02/12/21 (!) 305 lb 9.6 oz (138.6 kg)    Objective:  Physical Exam Vitals reviewed.  Constitutional:      General: She is not in acute distress.    Appearance: Normal appearance. She is obese.   Cardiovascular:     Rate and Rhythm: Normal rate and regular rhythm.     Pulses: Normal pulses.     Heart sounds: Normal heart sounds. No murmur heard. Pulmonary:     Effort: Pulmonary effort is normal. No respiratory distress.     Breath sounds: Normal breath sounds. No wheezing.  Musculoskeletal:        General: No swelling or tenderness. Normal range of motion.     Cervical back: Normal range of motion and neck supple.     Right lower leg: Edema (nonpitting) present.  Left lower leg: Edema (nonpitting) present.  Skin:    General: Skin is warm and dry.     Capillary Refill: Capillary refill takes less than 2 seconds.  Neurological:     General: No focal deficit present.     Mental Status: She is alert and oriented to person, place, and time.     Cranial Nerves: No cranial nerve deficit.     Motor: No weakness.  Psychiatric:        Mood and Affect: Mood normal.        Behavior: Behavior normal.        Thought Content: Thought content normal.        Judgment: Judgment normal.        Assessment And Plan:     1. Lower leg edema Comments: Will cut her amlodipine in half and increase her furosemide to 80 mg a day for 3 days, elevate feet and limit salt intake  2. Dyspnea on exertion Comments: No abnormal breath sounds, will check BNP since having lower extremity edema - Brain natriuretic peptide - CMP14+EGFR  3. Hypertensive kidney disease with stage 3a chronic kidney disease ([place]) Comments: Good control, continue current medications.   - CMP14+EGFR  4. Mixed hyperlipidemia Comments: Stable, continue current medications Low fat diet - Lipid panel  5. Diabetes mellitus due to underlying condition with stage 3a chronic kidney disease, with long-term current use of insulin (HCC) Comments: HgbA1c is slightly more elevated at last visit, will recheck levels and consider increasing her medications.  - Hemoglobin A1c - CMP14+EGFR  6. Morbid obesity with body mass index  (BMI) greater than or equal to 50 [facility]) Comments: Encouraged to increase physical activity as tolerated. She is encouraged to strive for BMI less than 30 to decrease cardiac risk. Advised to aim for at least 150 minutes of exercise per week.  7. Need for influenza vaccination Influenza vaccine administered Encouraged to take Tylenol as needed for fever or muscle aches. - Flu Vaccine QUAD High Dose(Fluad)     Patient was given opportunity to ask questions. Patient verbalized understanding of the plan and was able to repeat key elements of the plan. All questions were answered to their satisfaction.  Sarah Brine, FNP    I, Sarah Brine, FNP, have reviewed all documentation for this visit. The documentation on 06/26/21 for the exam, diagnosis, procedures, and orders are all accurate and complete.   IF YOU HAVE BEEN REFERRED TO A SPECIALIST, IT MAY TAKE 1-2 WEEKS TO SCHEDULE/PROCESS THE REFERRAL. IF YOU HAVE NOT HEARD FROM US/SPECIALIST IN TWO WEEKS, PLEASE GIVE [place] A CALL AT [phone].   THE PATIENT IS ENCOURAGED TO PRACTICE SOCIAL DISTANCING DUE TO THE COVID-19 PANDEMIC.

## 2021-06-27 ENCOUNTER — Other Ambulatory Visit: Payer: Self-pay | Admitting: Nurse Practitioner

## 2021-06-27 ENCOUNTER — Telehealth: Payer: Self-pay

## 2021-06-27 DIAGNOSIS — E119 Type 2 diabetes mellitus without complications: Secondary | ICD-10-CM

## 2021-06-27 LAB — CMP14+EGFR
ALT: 34 IU/L — ABNORMAL HIGH (ref 0–32)
AST: 24 IU/L (ref 0–40)
Albumin/Globulin Ratio: 1.7 (ref 1.2–2.2)
Albumin: 4.3 g/dL (ref 3.6–4.6)
Alkaline Phosphatase: 98 IU/L (ref 44–121)
BUN/Creatinine Ratio: 15 (ref 12–28)
BUN: 19 mg/dL (ref 8–27)
Bilirubin Total: 0.4 mg/dL (ref 0.0–1.2)
CO2: 23 mmol/L (ref 20–29)
Calcium: 9.7 mg/dL (ref 8.7–10.3)
Chloride: 101 mmol/L (ref 96–106)
Creatinine, Ser: 1.23 mg/dL — ABNORMAL HIGH (ref 0.57–1.00)
Globulin, Total: 2.6 g/dL (ref 1.5–4.5)
Glucose: 135 mg/dL — ABNORMAL HIGH (ref 70–99)
Potassium: 4.4 mmol/L (ref 3.5–5.2)
Sodium: 140 mmol/L (ref 134–144)
Total Protein: 6.9 g/dL (ref 6.0–8.5)
eGFR: 44 mL/min/{1.73_m2} — ABNORMAL LOW (ref >59)

## 2021-06-27 LAB — LIPID PANEL
Chol/HDL Ratio: 2.1 ratio (ref 0.0–4.4)
Cholesterol, Total: 121 mg/dL (ref 100–199)
HDL: 57 mg/dL (ref >39)
LDL Chol Calc (NIH): 50 mg/dL (ref 0–99)
Triglycerides: 69 mg/dL (ref 0–149)
VLDL Cholesterol Cal: 14 mg/dL (ref 5–40)

## 2021-06-27 LAB — HEMOGLOBIN A1C
Est. average glucose Bld gHb Est-mCnc: 160 mg/dL
Hgb A1c MFr Bld: 7.2 % — ABNORMAL HIGH (ref 4.8–5.6)

## 2021-06-27 LAB — BRAIN NATRIURETIC PEPTIDE: BNP: 62.4 pg/mL (ref 0.0–100.0)

## 2021-06-27 MED ORDER — TRESIBA FLEXTOUCH 100 UNIT/ML ~~LOC~~ SOPN: 15.0000 [IU] | PEN_INJECTOR | Freq: Every day | SUBCUTANEOUS | 3 refills | Status: DC

## 2021-06-27 NOTE — Chronic Care Management (AMB) (Signed)
Chronic Care Management Pharmacy Assistant   Name: Sarah Weaver  MRN: [id number] DOB: Oct 14, 1935   Reason for Encounter: Disease State/ Hypertension  Recent office visits:  06-26-2021 [name], [place]. Orders placed for brain natriuretic, CMP14+EGFR, A1C and lipid not resulted. Take 1/2 of your amlodipine 10 mg tablets until gone then you will get the 5 mg tablet. Take 80 mg of lasix (furosemide) until Saturday if you are better if not take 80 mg of lasix until Monday, increase water intake and if have lightheaded or dizziness stop taking and call office or go to ER. Keep feet elevated when possible and wear support or compression socks during the day. Check blood pressure daily if greater than 140/80. Flu vaccine given.  Recent consult visits:  None  Hospital visits:  None in previous 6 months  Medications: Outpatient Encounter Medications as of 06/27/2021  Medication Sig   Accu-Chek Softclix Lancets lancets USE UP TO 4 TIMES DAILY AS DIRECTED   albuterol (VENTOLIN HFA) 108 (90 Base) MCG/ACT inhaler Inhale 2 puffs into the lungs every 6 (six) hours as needed for wheezing or shortness of breath.   amLODipine (NORVASC) 5 MG tablet Take 1 tablet (5 mg total) by mouth daily.   aspirin EC 81 MG tablet Take 81 mg by mouth daily.   atorvastatin (LIPITOR) 80 MG tablet Take 1 tablet by mouth at bedtime   benzonatate (TESSALON PERLES) 100 MG capsule Take 1 capsule (100 mg total) by mouth 3 (three) times daily as needed for cough.   blood glucose meter kit and supplies KIT Dispense based on patient and insurance preference. Use up to four times daily as directed. (FOR ICD-9 250.00, 250.01).   brinzolamide (AZOPT) 1 % ophthalmic suspension Place 1 drop into both eyes 3 (three) times daily.   Cholecalciferol 125 MCG (5000 UT) TABS Take 1 tablet by mouth daily.   empagliflozin (JARDIANCE) 10 MG TABS tablet Take 1 tablet (10 mg total) by mouth daily before breakfast.   ezetimibe  (ZETIA) 10 MG tablet TAKE 1 TABLET BY MOUTH EVERY DAY   furosemide (LASIX) 40 MG tablet Take 1 tablet (40 mg total) by mouth daily.   insulin degludec (TRESIBA FLEXTOUCH) 100 UNIT/ML FlexTouch Pen Inject 10 Units into the skin daily.   Insulin Pen Needle (BD PEN NEEDLE NANO U/F) 32G X 4 MM MISC USE NIGHTLY AS DIRECTED   Latanoprostene Bunod 0.024 % SOLN Apply 1 drop to eye daily.   levothyroxine (SYNTHROID) 50 MCG tablet TAKE 1 TABLET BY MOUTH EVERY DAY BEFORE BREAKFAST   LINZESS 145 MCG CAPS capsule TAKE 1 CAPSULE (145 MCG TOTAL) BY MOUTH DAILY BEFORE BREAKFAST. FOR CONSTIPATION   losartan (COZAAR) 50 MG tablet Take 50 mg by mouth at bedtime.   Magnesium 250 MG TABS Take 1 tablet (250 mg total) by mouth every evening. Take with evening meal   nebivolol (BYSTOLIC) 2.5 MG tablet Take 1 tablet (2.5 mg total) by mouth daily.   nystatin (NYSTATIN) powder Apply 1 application topically 3 (three) times daily.   ONETOUCH VERIO test strip CHECK BLOOD SUGARS ONCE DAILY   OZEMPIC, 0.25 OR 0.5 MG/DOSE, 2 MG/1.5ML SOPN INJECT 0.25 MG INTO THE SKIN ONCE A WEEK.   traMADol-acetaminophen (ULTRACET) 37.5-325 MG tablet Take 1 tablet by mouth every 6 (six) hours as needed for moderate pain (prn back pain).   No facility-administered encounter medications on file as of 06/27/2021.   Reviewed chart prior to disease state call. Spoke with patient regarding BP  Recent Office Vitals: BP Readings from Last 3 Encounters:  06/26/21 130/72  03/19/21 134/80  02/12/21 (!) 142/70   Pulse Readings from Last 3 Encounters:  06/26/21 82  03/19/21 75  02/12/21 78    Wt Readings from Last 3 Encounters:  06/26/21 (!) 308 lb (139.7 kg)  03/19/21 299 lb 3.2 oz (135.7 kg)  02/12/21 (!) 305 lb 9.6 oz (138.6 kg)     Kidney Function Lab Results  Component Value Date/Time   CREATININE 1.60 (H) 11/06/2020 12:02 PM   CREATININE 1.31 (H) 07/11/2020 11:31 AM   CREATININE 1.41 (H) 01/18/2016 08:22 AM   CREATININE 2.62 (H)  12/28/2015 10:27 AM   GFRNONAA 30 (L) 11/06/2020 12:02 PM   GFRAA 34 (L) 11/06/2020 12:02 PM    BMP Latest Ref Rng & Units 11/06/2020 07/11/2020 06/07/2020  Glucose 65 - 99 mg/dL 146(H) 188(H) -  BUN 8 - 27 mg/dL 22 22 15   Creatinine 0.57 - 1.00 mg/dL 1.60(H) 1.31(H) 1.2(A)  BUN/Creat Ratio 12 - 28 14 17  -  Sodium 134 - 144 mmol/L 141 144 137  Potassium 3.5 - 5.2 mmol/L 4.4 4.1 4.4  Chloride 96 - 106 mmol/L 100 101 101  CO2 20 - 29 mmol/L 22 20 27(A)  Calcium 8.7 - 10.3 mg/dL 9.6 10.0 9.9    Current antihypertensive regimen:  Amlodipine 5 mg daily Losartan 50 mg daily Nebivolol 2.5 mg daily  How often are you checking your Blood Pressure? 3-5x per week  Current home BP readings: 132/72  What recent interventions/DTPs have been made by any provider to improve Blood Pressure control since last CPP Visit:  Educated on BP goals and benefits of medications for prevention of heart attack, stroke and kidney damage Daily salt intake goal < 2300 mg Importance of home blood pressure monitoring Proper BP monitoring technique  Any recent hospitalizations or ED visits since last visit with CPP? No  What diet changes have been made to improve Blood Pressure Control?  Patient states she doesn't use salt and eats vegetables/fruits daily and drinks plenty of water.  What exercise is being done to improve your Blood Pressure Control?  Patient states she walks some in the house. Patient states she will walk more outside once the weather cools off  Adherence Review: Is the patient currently on ACE/ARB medication? Yes Does the patient have >5 day gap between last estimated fill dates? No  NOTES: Rescheduled patient's appointment with Orlando Penner to November.  Care Gaps: Shingrix overdue Covid booster overdue last completed 10-17-2020 AWV 07-25-2021  Star Rating Drugs: Losartan 50 mg - Last filled 05-22-2021 [facility] Atorvastatin 80 mg- Last filled 04-30-2021 [facility] Ozempic 0.25  mg- Last filled 06-19-2021 [facility] Jardiance 10 mg- Last filled 05-14-2021 [facility]  [place] Clinical Pharmacist Assistant [phone]

## 2021-07-08 DIAGNOSIS — N183 Chronic kidney disease, stage 3 unspecified: Secondary | ICD-10-CM | POA: Diagnosis not present

## 2021-07-16 ENCOUNTER — Other Ambulatory Visit: Payer: Self-pay | Admitting: Nurse Practitioner

## 2021-07-16 DIAGNOSIS — G8929 Other chronic pain: Secondary | ICD-10-CM

## 2021-07-16 DIAGNOSIS — M545 Low back pain, unspecified: Secondary | ICD-10-CM

## 2021-07-17 ENCOUNTER — Encounter: Payer: Medicare Other | Admitting: Nurse Practitioner

## 2021-07-17 ENCOUNTER — Ambulatory Visit: Payer: Medicare Other

## 2021-07-18 ENCOUNTER — Ambulatory Visit: Payer: Medicare Other

## 2021-07-18 ENCOUNTER — Encounter: Payer: Medicare Other | Admitting: Nurse Practitioner

## 2021-07-18 DIAGNOSIS — I129 Hypertensive chronic kidney disease with stage 1 through stage 4 chronic kidney disease, or unspecified chronic kidney disease: Secondary | ICD-10-CM | POA: Diagnosis not present

## 2021-07-18 DIAGNOSIS — R809 Proteinuria, unspecified: Secondary | ICD-10-CM | POA: Diagnosis not present

## 2021-07-18 DIAGNOSIS — E1122 Type 2 diabetes mellitus with diabetic chronic kidney disease: Secondary | ICD-10-CM | POA: Diagnosis not present

## 2021-07-18 DIAGNOSIS — N183 Chronic kidney disease, stage 3 unspecified: Secondary | ICD-10-CM | POA: Diagnosis not present

## 2021-07-22 ENCOUNTER — Telehealth: Payer: Medicare Other

## 2021-07-22 ENCOUNTER — Ambulatory Visit (INDEPENDENT_AMBULATORY_CARE_PROVIDER_SITE_OTHER): Payer: Medicare Other

## 2021-07-22 DIAGNOSIS — N1831 Chronic kidney disease, stage 3a: Secondary | ICD-10-CM

## 2021-07-22 DIAGNOSIS — E782 Mixed hyperlipidemia: Secondary | ICD-10-CM

## 2021-07-22 DIAGNOSIS — I10 Essential (primary) hypertension: Secondary | ICD-10-CM

## 2021-07-22 DIAGNOSIS — E119 Type 2 diabetes mellitus without complications: Secondary | ICD-10-CM

## 2021-07-23 NOTE — Chronic Care Management (AMB) (Signed)
Chronic Care Management   CCM RN Visit Note  07/22/2021 Name: Sarah Weaver MRN: [id number] DOB: December 10, 1935  Subjective: Sarah Weaver is a 85 y.o. year old female who is a primary care patient of [name], [place]. The care management team was consulted for assistance with disease management and care coordination needs.    Engaged with patient by telephone for follow up visit in response to provider referral for case management and/or care coordination services.   Consent to Services:  The patient was given information about Chronic Care Management services, agreed to services, and gave verbal consent prior to initiation of services.  Please see initial visit note for detailed documentation.   Patient agreed to services and verbal consent obtained.   Assessment: Review of patient past medical history, allergies, medications, health status, including review of consultants reports, laboratory and other test data, was performed as part of comprehensive evaluation and provision of chronic care management services.   SDOH (Social Determinants of Health) assessments and interventions performed:  yes, no acute needs   CCM Care Plan  Allergies  Allergen Reactions   Carvedilol Cough    Pt reports causes her to cough   Lisinopril Cough    REPORTS CAUSES DRY COUGH    Outpatient Encounter Medications as of 07/22/2021  Medication Sig Note   losartan (COZAAR) 50 MG tablet Take 50 mg by mouth at bedtime.    Accu-Chek Softclix Lancets lancets USE UP TO 4 TIMES DAILY AS DIRECTED    albuterol (VENTOLIN HFA) 108 (90 Base) MCG/ACT inhaler Inhale 2 puffs into the lungs every 6 (six) hours as needed for wheezing or shortness of breath.    amLODipine (NORVASC) 5 MG tablet Take 1 tablet (5 mg total) by mouth daily. (Patient not taking: Reported on 07/23/2021) 07/23/2021: Nephrologist is holding Amlodipine   aspirin EC 81 MG tablet Take 81 mg by mouth daily.    atorvastatin  (LIPITOR) 80 MG tablet Take 1 tablet by mouth at bedtime    benzonatate (TESSALON PERLES) 100 MG capsule Take 1 capsule (100 mg total) by mouth 3 (three) times daily as needed for cough.    blood glucose meter kit and supplies KIT Dispense based on patient and insurance preference. Use up to four times daily as directed. (FOR ICD-9 250.00, 250.01).    brinzolamide (AZOPT) 1 % ophthalmic suspension Place 1 drop into both eyes 3 (three) times daily.    Cholecalciferol 125 MCG (5000 UT) TABS Take 1 tablet by mouth daily.    empagliflozin (JARDIANCE) 10 MG TABS tablet Take 1 tablet (10 mg total) by mouth daily before breakfast.    ezetimibe (ZETIA) 10 MG tablet TAKE 1 TABLET BY MOUTH EVERY DAY    furosemide (LASIX) 40 MG tablet Take 1 tablet (40 mg total) by mouth daily.    insulin degludec (TRESIBA FLEXTOUCH) 100 UNIT/ML FlexTouch Pen Inject 15 Units into the skin daily.    Insulin Pen Needle (BD PEN NEEDLE NANO U/F) 32G X 4 MM MISC USE NIGHTLY AS DIRECTED    Latanoprostene Bunod 0.024 % SOLN Apply 1 drop to eye daily.    levothyroxine (SYNTHROID) 50 MCG tablet TAKE 1 TABLET BY MOUTH EVERY DAY BEFORE BREAKFAST    LINZESS 145 MCG CAPS capsule TAKE 1 CAPSULE (145 MCG TOTAL) BY MOUTH DAILY BEFORE BREAKFAST. FOR CONSTIPATION    Magnesium 250 MG TABS Take 1 tablet (250 mg total) by mouth every evening. Take with evening meal    nebivolol (BYSTOLIC) 2.5 MG  tablet Take 1 tablet (2.5 mg total) by mouth daily.    nystatin (NYSTATIN) powder Apply 1 application topically 3 (three) times daily.    ONETOUCH VERIO test strip CHECK BLOOD SUGARS ONCE DAILY    OZEMPIC, 0.25 OR 0.5 MG/DOSE, 2 MG/1.5ML SOPN INJECT 0.25 MG INTO THE SKIN ONCE A WEEK.    traMADol-acetaminophen (ULTRACET) 37.5-325 MG tablet TAKE 1 TABLET BY MOUTH EVERY 6 HOURS AS NEEDED FOR MODERATE BACK PAIN    No facility-administered encounter medications on file as of 07/22/2021.    Patient Active Problem List   Diagnosis Date Noted   Class 3  severe obesity due to excess calories with serious comorbidity in adult [facility]) 08/25/2019   Diabetes mellitus due to underlying condition with stage 3a chronic kidney disease, with long-term current use of insulin ([place]) 06/16/2019   CAD (coronary artery disease) 07/26/2018   Chronic diastolic CHF (congestive heart failure) ([place]) 07/26/2018   Chronic kidney disease (CKD), stage III (moderate) ([facility]) 07/26/2018   Chest pain, moderate coronary artery risk 12/20/2015   DM2 (diabetes mellitus, type 2) ([place]) 12/20/2015   HTN (hypertension) 12/20/2015   HLD (hyperlipidemia) 12/20/2015   Ischemic chest pain ([place]) 12/20/2015   NSTEMI (non-ST elevated myocardial infarction) ([place])    Hypertensive heart disease without heart failure    Acute renal failure ([place])    Cholelithiasis 04/14/2011    Conditions to be addressed/monitored: HTN, HLD, DM II, Stage 3a Chronic Kidney disease  Care Plan : RN Care Manager Plan of Care  Updates made by Lynne Logan, RN since 07/22/2021 12:00 AM     Problem: No plan established for management of chronic disease states (HTN, HLD, DM II, Stage 3a Chronic Kidney disease)   Priority: High     Long-Range Goal: Establish plan of care for management of chronic disease states (HTN, HLD, DM II, Stage 3a Chronic Kidney disease)   Start Date: 07/22/2021  Expected End Date: 07/22/2022  This Visit's Progress: On track  Priority: High  Note:   Current Barriers:  Knowledge Deficits related to plan of care for management of HTN, HLD, DM II, Stage 3a Chronic Kidney disease Chronic Disease Management support and education needs related to HTN, HLD, DM II, Stage 3a Chronic Kidney disease  RNCM Clinical Goal(s):  Patient will verbalize basic understanding of  HTN, HLD, DM II, Stage 3a Chronic Kidney disease disease process and self health management plan   demonstrate Improved health management independence   continue to work with RN Care Manager to address care management and  care coordination needs related to  HTN, HLD, DM II, Stage 3a Chronic Kidney disease will demonstrate ongoing self health care management ability    through collaboration with RN Care manager, provider, and care team.   Interventions: 1:1 collaboration with primary care provider regarding development and update of comprehensive plan of care as evidenced by provider attestation and co-signature Inter-disciplinary care team collaboration (see longitudinal plan of care) Evaluation of current treatment plan related to  self management and patient's adherence to plan as established by provider  Hypertension Interventions: Last practice recorded BP readings:  BP Readings from Last 3 Encounters:  06/26/21 130/72  03/19/21 134/80  02/12/21 (!) 142/70  Most recent eGFR/CrCl:  Lab Results  Component Value Date   EGFR 44 (L) 06/26/2021    No components found for: CRCL  Evaluation of current treatment plan related to hypertension self management and patient's adherence to plan as established by provider; Provided education to patient  re: stroke prevention, s/s of heart attack and stroke; Reviewed medications with patient and discussed importance of compliance; Counseled on the importance of exercise goals with target of 150 minutes per week Discussed plans with patient for ongoing care management follow up and provided patient with direct contact information for care management team; Advised patient, providing education and rationale, to monitor blood pressure daily and record, calling PCP for findings outside established parameters;  Provided education on prescribed diet low Sodium;  Discussed complications of poorly controlled blood pressure such as heart disease, stroke, circulatory complications, vision complications, kidney impairment, sexual dysfunction;  Assessed social determinant of health barriers;   Diabetes Interventions: Assessed patient's understanding of A1c goal: <7% Provided  education to patient about basic DM disease process; Reviewed medications with patient and discussed importance of medication adherence; Counseled on importance of regular laboratory monitoring as prescribed; Discussed plans with patient for ongoing care management follow up and provided patient with direct contact information for care management team; Provided patient with written educational materials related to hypo and hyperglycemia and importance of correct treatment; Review of patient status, including review of consultants reports, relevant laboratory and other test results, and medications completed; Educated patient on daily glycemic control, FBS 80-130, <180 after meals; Educated patient on hypo/hyperglycemic protocol and when to call the doctor if needed  Lab Results  Component Value Date   HGBA1C 7.2 (H) 06/26/2021   Chronic Kidney Disease Interventions:  (Status:  Goal on track:  Yes Evaluation of current treatment plan related to chronic kidney disease self management and patient's adherence to plan as established by provider      Reviewed prescribed diet increase water intake as directed Discussed complications of poorly controlled blood pressure such as heart disease, stroke, circulatory complications, vision complications, kidney impairment, sexual dysfunction    Discussed plans with patient for ongoing care management follow up and provided patient with direct contact information for care management team    Discussed the impact of chronic kidney disease on daily life and mental health and acknowledged and normalized feelings of disempowerment, fear, and frustration    Provided education on kidney disease progression    Engage patient in early, proactive and ongoing discussion about goals of care and what matters most to them    Mailed printed educational material related to Eating Right with Chronic Kidney disease  Last practice recorded BP readings:  BP Readings from Last 3  Encounters:  06/26/21 130/72  03/19/21 134/80  02/12/21 (!) 142/70  Most recent eGFR/CrCl:  Lab Results  Component Value Date   EGFR 44 (L) 06/26/2021    No components found for: CRCL  Patient Goals/Self-Care Activities: Patient will self administer medications as prescribed Patient will attend all scheduled provider appointments Patient will call pharmacy for medication refills Patient will attend church or other social activities Patient will continue to perform ADL's independently Patient will continue to perform IADL's independently Patient will call provider office for new concerns or questions  Follow Up Plan:  Telephone follow up appointment with care management team member scheduled for:  08/22/21     Plan:Telephone follow up appointment with care management team member scheduled for:  08/22/21  Barb Merino, RN, BSN, CCM Care Management Coordinator [place] [facility]  Direct Phone: [phone]

## 2021-07-23 NOTE — Patient Instructions (Addendum)
Visit Information   PATIENT GOALS/PLAN OF CARE:   Care Plan : RN Care Manager Plan of Care  Updates made by Lynne Logan, RN since 07/22/2021 12:00 AM     Problem: No plan established for management of chronic disease states (HTN, HLD, DM II, Stage 3a Chronic Kidney disease)   Priority: High     Long-Range Goal: Establish plan of care for management of chronic disease states (HTN, HLD, DM II, Stage 3a Chronic Kidney disease)   Start Date: 07/22/2021  Expected End Date: 07/22/2022  This Visit's Progress: On track  Priority: High  Note:   Current Barriers:  Knowledge Deficits related to plan of care for management of HTN, HLD, DM II, Stage 3a Chronic Kidney disease Chronic Disease Management support and education needs related to HTN, HLD, DM II, Stage 3a Chronic Kidney disease  RNCM Clinical Goal(s):  Patient will verbalize basic understanding of  HTN, HLD, DM II, Stage 3a Chronic Kidney disease disease process and self health management plan   demonstrate Improved health management independence   continue to work with RN Care Manager to address care management and care coordination needs related to  HTN, HLD, DM II, Stage 3a Chronic Kidney disease will demonstrate ongoing self health care management ability    through collaboration with RN Care manager, provider, and care team.   Interventions: 1:1 collaboration with primary care provider regarding development and update of comprehensive plan of care as evidenced by provider attestation and co-signature Inter-disciplinary care team collaboration (see longitudinal plan of care) Evaluation of current treatment plan related to  self management and patient's adherence to plan as established by provider  Hypertension Interventions: Last practice recorded BP readings:  BP Readings from Last 3 Encounters:  06/26/21 130/72  03/19/21 134/80  02/12/21 (!) 142/70  Most recent eGFR/CrCl:  Lab Results  Component Value Date   EGFR 44 (L)  06/26/2021    No components found for: CRCL  Evaluation of current treatment plan related to hypertension self management and patient's adherence to plan as established by provider; Provided education to patient re: stroke prevention, s/s of heart attack and stroke; Reviewed medications with patient and discussed importance of compliance; Counseled on the importance of exercise goals with target of 150 minutes per week Discussed plans with patient for ongoing care management follow up and provided patient with direct contact information for care management team; Advised patient, providing education and rationale, to monitor blood pressure daily and record, calling PCP for findings outside established parameters;  Provided education on prescribed diet low Sodium;  Discussed complications of poorly controlled blood pressure such as heart disease, stroke, circulatory complications, vision complications, kidney impairment, sexual dysfunction;  Assessed social determinant of health barriers;   Diabetes Interventions: Assessed patient's understanding of A1c goal: <7% Provided education to patient about basic DM disease process; Reviewed medications with patient and discussed importance of medication adherence; Counseled on importance of regular laboratory monitoring as prescribed; Discussed plans with patient for ongoing care management follow up and provided patient with direct contact information for care management team; Provided patient with written educational materials related to hypo and hyperglycemia and importance of correct treatment; Review of patient status, including review of consultants reports, relevant laboratory and other test results, and medications completed; Educated patient on daily glycemic control, FBS 80-130, <180 after meals; Educated patient on hypo/hyperglycemic protocol and when to call the doctor if needed  Lab Results  Component Value Date   HGBA1C 7.2 (H) 06/26/2021  Chronic Kidney Disease Interventions:  (Status:  Goal on track:  Yes Evaluation of current treatment plan related to chronic kidney disease self management and patient's adherence to plan as established by provider      Reviewed prescribed diet increase water intake as directed Discussed complications of poorly controlled blood pressure such as heart disease, stroke, circulatory complications, vision complications, kidney impairment, sexual dysfunction    Discussed plans with patient for ongoing care management follow up and provided patient with direct contact information for care management team    Discussed the impact of chronic kidney disease on daily life and mental health and acknowledged and normalized feelings of disempowerment, fear, and frustration    Provided education on kidney disease progression    Engage patient in early, proactive and ongoing discussion about goals of care and what matters most to them    Mailed printed educational material related to Eating Right with Chronic Kidney disease Last practice recorded BP readings:  BP Readings from Last 3 Encounters:  06/26/21 130/72  03/19/21 134/80  02/12/21 (!) 142/70  Most recent eGFR/CrCl:  Lab Results  Component Value Date   EGFR 44 (L) 06/26/2021    No components found for: CRCL  Patient Goals/Self-Care Activities: Patient will self administer medications as prescribed Patient will attend all scheduled provider appointments Patient will call pharmacy for medication refills Patient will attend church or other social activities Patient will continue to perform ADL's independently Patient will continue to perform IADL's independently Patient will call provider office for new concerns or questions  Follow Up Plan:  Telephone follow up appointment with care management team member scheduled for:  08/22/21      Consent to CCM Services: Ms. Katzenberger was given information about Chronic Care Management services  including:  CCM service includes personalized support from designated clinical staff supervised by her physician, including individualized plan of care and coordination with other care providers 24/7 contact phone numbers for assistance for urgent and routine care needs. Service will only be billed when office clinical staff spend 20 minutes or more in a month to coordinate care. Only one practitioner may furnish and bill the service in a calendar month. The patient may stop CCM services at any time (effective at the end of the month) by phone call to the office staff. The patient will be responsible for cost sharing (co-pay) of up to 20% of the service fee (after annual deductible is met).  Patient agreed to services and verbal consent obtained.   The patient verbalized understanding of instructions, educational materials, and care plan provided today and declined offer to receive copy of patient instructions, educational materials, and care plan.   Telephone follow up appointment with care management team member scheduled for: 08/22/21  Barb Merino, RN, BSN, CCM Care Management Coordinator [place] [facility]  Direct Phone: [phone]

## 2021-07-25 ENCOUNTER — Ambulatory Visit (INDEPENDENT_AMBULATORY_CARE_PROVIDER_SITE_OTHER): Payer: Medicare Other

## 2021-07-25 VITALS — BP 148/82 | HR 81 | Temp 98.0°F | Ht 61.0 in | Wt 303.2 lb

## 2021-07-25 DIAGNOSIS — Z0000 Encounter for general adult medical examination without abnormal findings: Secondary | ICD-10-CM | POA: Diagnosis not present

## 2021-07-25 NOTE — Patient Instructions (Signed)
[name] , Thank you for taking time to come for your Medicare Wellness Visit. I appreciate your ongoing commitment to your health goals. Please review the following plan we discussed and let me know if I can assist you in the future.   Screening recommendations/referrals: Colonoscopy: not required Mammogram: not required Bone Density: completed 10/11/2019 Recommended yearly ophthalmology/optometry visit for glaucoma screening and checkup Recommended yearly dental visit for hygiene and checkup  Vaccinations: Influenza vaccine: completed 06/26/2021 Pneumococcal vaccine: completed 07/29/2017 Tdap vaccine: completed 09/22/2018, due 09/22/2028 Shingles vaccine: discussed   Covid-19: 10/17/2020, 09/26/2020  Advanced directives: Advance directive discussed with you today. Even though you declined this today please call our office should you change your mind and we can give you the proper paperwork for you to fill out.  Conditions/risks identified: none  Next appointment: Follow up in one year for your annual wellness visit    Preventive Care 65 Years and Older, Female Preventive care refers to lifestyle choices and visits with your health care provider that can promote health and wellness. What does preventive care include? A yearly physical exam. This is also called an annual well check. Dental exams once or twice a year. Routine eye exams. Ask your health care provider how often you should have your eyes checked. Personal lifestyle choices, including: Daily care of your teeth and gums. Regular physical activity. Eating a healthy diet. Avoiding tobacco and drug use. Limiting alcohol use. Practicing safe sex. Taking low-dose aspirin every day. Taking vitamin and mineral supplements as recommended by your health care provider. What happens during an annual well check? The services and screenings done by your health care provider during your annual well check will depend on your age,  overall health, lifestyle risk factors, and family history of disease. Counseling  Your health care provider may ask you questions about your: Alcohol use. Tobacco use. Drug use. Emotional well-being. Home and relationship well-being. Sexual activity. Eating habits. History of falls. Memory and ability to understand (cognition). Work and work Astronomer. Reproductive health. Screening  You may have the following tests or measurements: Height, weight, and BMI. Blood pressure. Lipid and cholesterol levels. These may be checked every 5 years, or more frequently if you are over 9 years old. Skin check. Lung cancer screening. You may have this screening every year starting at age 49 if you have a 30-pack-year history of smoking and currently smoke or have quit within the past 15 years. Fecal occult blood test (FOBT) of the stool. You may have this test every year starting at age 42. Flexible sigmoidoscopy or colonoscopy. You may have a sigmoidoscopy every 5 years or a colonoscopy every 10 years starting at age 7. Hepatitis C blood test. Hepatitis B blood test. Sexually transmitted disease (STD) testing. Diabetes screening. This is done by checking your blood sugar (glucose) after you have not eaten for a while (fasting). You may have this done every 1-3 years. Bone density scan. This is done to screen for osteoporosis. You may have this done starting at age 41. Mammogram. This may be done every 1-2 years. Talk to your health care provider about how often you should have regular mammograms. Talk with your health care provider about your test results, treatment options, and if necessary, the need for more tests. Vaccines  Your health care provider may recommend certain vaccines, such as: Influenza vaccine. This is recommended every year. Tetanus, diphtheria, and acellular pertussis (Tdap, Td) vaccine. You may need a Td booster every 10 years. Zoster  vaccine. You may need this after age  50. Pneumococcal 13-valent conjugate (PCV13) vaccine. One dose is recommended after age 83. Pneumococcal polysaccharide (PPSV23) vaccine. One dose is recommended after age 54. Talk to your health care provider about which screenings and vaccines you need and how often you need them. This information is not intended to replace advice given to you by your health care provider. Make sure you discuss any questions you have with your health care provider. Document Released: 09/28/2015 Document Revised: 05/21/2016 Document Reviewed: 07/03/2015 Elsevier Interactive Patient Education  2017 [place] Prevention in the Home Falls can cause injuries. They can happen to people of all ages. There are many things you can do to make your home safe and to help prevent falls. What can I do on the outside of my home? Regularly fix the edges of walkways and driveways and fix any cracks. Remove anything that might make you trip as you walk through a door, such as a raised step or threshold. Trim any bushes or trees on the path to your home. Use bright outdoor lighting. Clear any walking paths of anything that might make someone trip, such as rocks or tools. Regularly check to see if handrails are loose or broken. Make sure that both sides of any steps have handrails. Any raised decks and porches should have guardrails on the edges. Have any leaves, snow, or ice cleared regularly. Use sand or salt on walking paths during winter. Clean up any spills in your garage right away. This includes oil or grease spills. What can I do in the bathroom? Use night lights. Install grab bars by the toilet and in the tub and shower. Do not use towel bars as grab bars. Use non-skid mats or decals in the tub or shower. If you need to sit down in the shower, use a plastic, non-slip stool. Keep the floor dry. Clean up any water that spills on the floor as soon as it happens. Remove soap buildup in the tub or shower  regularly. Attach bath mats securely with double-sided non-slip rug tape. Do not have throw rugs and other things on the floor that can make you trip. What can I do in the bedroom? Use night lights. Make sure that you have a light by your bed that is easy to reach. Do not use any sheets or blankets that are too big for your bed. They should not hang down onto the floor. Have a firm chair that has side arms. You can use this for support while you get dressed. Do not have throw rugs and other things on the floor that can make you trip. What can I do in the kitchen? Clean up any spills right away. Avoid walking on wet floors. Keep items that you use a lot in easy-to-reach places. If you need to reach something above you, use a strong step stool that has a grab bar. Keep electrical cords out of the way. Do not use floor polish or wax that makes floors slippery. If you must use wax, use non-skid floor wax. Do not have throw rugs and other things on the floor that can make you trip. What can I do with my stairs? Do not leave any items on the stairs. Make sure that there are handrails on both sides of the stairs and use them. Fix handrails that are broken or loose. Make sure that handrails are as long as the stairways. Check any carpeting to make sure that it  is firmly attached to the stairs. Fix any carpet that is loose or worn. Avoid having throw rugs at the top or bottom of the stairs. If you do have throw rugs, attach them to the floor with carpet tape. Make sure that you have a light switch at the top of the stairs and the bottom of the stairs. If you do not have them, ask someone to add them for you. What else can I do to help prevent falls? Wear shoes that: Do not have high heels. Have rubber bottoms. Are comfortable and fit you well. Are closed at the toe. Do not wear sandals. If you use a stepladder: Make sure that it is fully opened. Do not climb a closed stepladder. Make sure that  both sides of the stepladder are locked into place. Ask someone to hold it for you, if possible. Clearly mark and make sure that you can see: Any grab bars or handrails. First and last steps. Where the edge of each step is. Use tools that help you move around (mobility aids) if they are needed. These include: Canes. Walkers. Scooters. Crutches. Turn on the lights when you go into a dark area. Replace any light bulbs as soon as they burn out. Set up your furniture so you have a clear path. Avoid moving your furniture around. If any of your floors are uneven, fix them. If there are any pets around you, be aware of where they are. Review your medicines with your doctor. Some medicines can make you feel dizzy. This can increase your chance of falling. Ask your doctor what other things that you can do to help prevent falls. This information is not intended to replace advice given to you by your health care provider. Make sure you discuss any questions you have with your health care provider. Document Released: 06/28/2009 Document Revised: 02/07/2016 Document Reviewed: 10/06/2014 Elsevier Interactive Patient Education  2017 [employer].

## 2021-07-25 NOTE — Progress Notes (Signed)
This visit occurred during the SARS-CoV-2 public health emergency.  Safety protocols were in place, including screening questions prior to the visit, additional usage of staff PPE, and extensive cleaning of exam room while observing appropriate contact time as indicated for disinfecting solutions.  Subjective:   Sarah Weaver is a 85 y.o. female who presents for Medicare Annual (Subsequent) preventive examination.  Review of Systems     Cardiac Risk Factors include: advanced age (>12mn, >>50women);diabetes mellitus;hypertension;obesity (BMI >30kg/m2);sedentary lifestyle     Objective:    Today's Vitals   07/25/21 1130  BP: (!) 148/82  Pulse: 81  Temp: 98 F (36.7 C)  TempSrc: Oral  SpO2: 97%  Weight: (!) 303 lb 3.2 oz (137.5 kg)  Height: 5' 1" (1.549 m)   Body mass index is 57.29 kg/m.  Advanced Directives 07/25/2021 07/11/2020 05/04/2020 05/05/2019 08/10/2018 12/20/2015  Does Patient Have a Medical Advance Directive? _0  No  Would patient like information on creating a medical advance directive? No - Patient declined - No - Patient declined - No - Patient declined No - patient declined information    Current Medications (verified) Outpatient Encounter Medications as of 07/25/2021  Medication Sig   Accu-Chek Softclix Lancets lancets USE UP TO 4 TIMES DAILY AS DIRECTED   aspirin EC 81 MG tablet Take 81 mg by mouth daily.   atorvastatin (LIPITOR) 80 MG tablet Take 1 tablet by mouth at bedtime   blood glucose meter kit and supplies KIT Dispense based on patient and insurance preference. Use up to four times daily as directed. (FOR ICD-9 250.00, 250.01).   brinzolamide (AZOPT) 1 % ophthalmic suspension Place 1 drop into both eyes 3 (three) times daily.   Cholecalciferol 125 MCG (5000 UT) TABS Take 1 tablet by mouth daily.   empagliflozin (JARDIANCE) 10 MG TABS tablet Take 1 tablet (10 mg total) by mouth daily before breakfast.   ezetimibe (ZETIA) 10 MG  tablet TAKE 1 TABLET BY MOUTH EVERY DAY   furosemide (LASIX) 40 MG tablet Take 1 tablet (40 mg total) by mouth daily.   insulin degludec (TRESIBA FLEXTOUCH) 100 UNIT/ML FlexTouch Pen Inject 15 Units into the skin daily.   Insulin Pen Needle (BD PEN NEEDLE NANO U/F) 32G X 4 MM MISC USE NIGHTLY AS DIRECTED   Latanoprostene Bunod 0.024 % SOLN Apply 1 drop to eye daily.   levothyroxine (SYNTHROID) 50 MCG tablet TAKE 1 TABLET BY MOUTH EVERY DAY BEFORE BREAKFAST   LINZESS 145 MCG CAPS capsule TAKE 1 CAPSULE (145 MCG TOTAL) BY MOUTH DAILY BEFORE BREAKFAST. FOR CONSTIPATION   losartan (COZAAR) 50 MG tablet Take 50 mg by mouth at bedtime.   nebivolol (BYSTOLIC) 2.5 MG tablet Take 1 tablet (2.5 mg total) by mouth daily.   ONETOUCH VERIO test strip CHECK BLOOD SUGARS ONCE DAILY   OZEMPIC, 0.25 OR 0.5 MG/DOSE, 2 MG/1.5ML SOPN INJECT 0.25 MG INTO THE SKIN ONCE A WEEK.   traMADol-acetaminophen (ULTRACET) 37.5-325 MG tablet TAKE 1 TABLET BY MOUTH EVERY 6 HOURS AS NEEDED FOR MODERATE BACK PAIN   albuterol (VENTOLIN HFA) 108 (90 Base) MCG/ACT inhaler Inhale 2 puffs into the lungs every 6 (six) hours as needed for wheezing or shortness of breath. (Patient not taking: Reported on 07/25/2021)   amLODipine (NORVASC) 5 MG tablet Take 1 tablet (5 mg total) by mouth daily. (Patient not taking: No sig reported)   benzonatate (TESSALON PERLES) 100 MG capsule Take 1 capsule (100 mg total) by mouth 3 (three) times  daily as needed for cough. (Patient not taking: Reported on 07/25/2021)   Magnesium 250 MG TABS Take 1 tablet (250 mg total) by mouth every evening. Take with evening meal (Patient not taking: Reported on 07/25/2021)   nystatin (NYSTATIN) powder Apply 1 application topically 3 (three) times daily. (Patient not taking: Reported on 07/25/2021)   No facility-administered encounter medications on file as of 07/25/2021.    Allergies (verified) Carvedilol and Lisinopril   History: Past Medical History:  Diagnosis  Date   Arthritis    "all over"   Chronic diastolic CHF (congestive heart failure) ([place])    Echocardiogram 12/2019: EF 60-65, no RWMA, mild LVH, Gr 1 DD, trivial MR, mild AoV sclerosis, no AS   Chronic lower back pain    CKD (chronic kidney disease), stage III ([facility])    Coronary artery disease    a. status post DES to the LAD 12/2015.   Glaucoma    Glaucoma of both eyes    Hyperkalemia    Hyperlipidemia    Hypertension    Type II diabetes mellitus ([place])    Past Surgical History:  Procedure Laterality Date   CARDIAC CATHETERIZATION N/A 12/20/2015   Procedure: Left Heart Cath and Coronary Angiography;  Surgeon: Lorretta Harp, MD;  Location: [facility];  Service: Cardiovascular;  Laterality: N/A;   CARDIAC CATHETERIZATION N/A 12/20/2015   Procedure: Coronary Stent Intervention;  Surgeon: Lorretta Harp, MD;  Location: [facility];  Service: Cardiovascular;  Laterality: N/A;  mid LAD 2.5 x 20 Synergy   CATARACT EXTRACTION W/ INTRAOCULAR LENS  IMPLANT, BILATERAL Bilateral    CORONARY ANGIOPLASTY WITH STENT PLACEMENT  12/20/2015   LAPAROSCOPIC CHOLECYSTECTOMY  04/23/11   TONSILLECTOMY     Family History  Problem Relation Age of Onset   Heart disease Mother    Heart attack Mother    Hypertension Mother    Heart disease Sister    Cancer Sister    Stroke Sister    Social History   Socioeconomic History   Marital status: Divorced    Spouse name: Not on file   Number of children: Not on file   Years of education: Not on file   Highest education level: Not on file  Occupational History   Occupation: retired  Tobacco Use   Smoking status: Never   Smokeless tobacco: Never  Vaping Use   Vaping Use: Never used  Substance and Sexual Activity   Alcohol use: No   Drug use: No   Sexual activity: Not Currently  Other Topics Concern   Not on file  Social History Narrative   Not on file   Social Determinants of Health   Financial Resource Strain: Low Risk    Difficulty  of Paying Living Expenses: Not hard at all  Food Insecurity: No Food Insecurity   Worried About Charity fundraiser in the Last Year: Never true   [place] in the Last Year: Never true  Transportation Needs: No Transportation Needs   Lack of Transportation (Medical): No   Lack of Transportation (Non-Medical): No  Physical Activity: Inactive   Days of Exercise per Week: 0 days   Minutes of Exercise per Session: 0 min  Stress: No Stress Concern Present   Feeling of Stress : Not at all  Social Connections: Not on file    Tobacco Counseling Counseling given: Not Answered   Clinical Intake:  Pre-visit preparation completed: Yes  Pain : No/denies pain  Nutritional Status: BMI > 30  Obese Nutritional Risks: None Diabetes: Yes  How often do you need to have someone help you when you read instructions, pamphlets, or other written materials from your doctor or pharmacy?: 1 - Never What is the last grade level you completed in school?: 10th grade  Diabetic? Yes Nutrition Risk Assessment:  Has the patient had any N/V/D within the last 2 months?  No  Does the patient have any non-healing wounds?  No  Has the patient had any unintentional weight loss or weight gain?  Yes   Diabetes:  Is the patient diabetic?  Yes  If diabetic, was a CBG obtained today?  No  Did the patient bring in their glucometer from home?  No  How often do you monitor your CBG's? Twice weekly.   Financial Strains and Diabetes Management:  Are you having any financial strains with the device, your supplies or your medication? No .  Does the patient want to be seen by Chronic Care Management for management of their diabetes?  No  Would the patient like to be referred to a Nutritionist or for Diabetic Management?  No   Diabetic Exams:  Diabetic Eye Exam: Completed 10/25/2020 Diabetic Foot Exam: Overdue, Pt has been advised about the importance in completing this exam. Pt is scheduled for diabetic  foot exam on next appointment.   Interpreter Needed?: No  Information entered by :: NAllen LPN   Activities of Daily Living In your present state of health, do you have any difficulty performing the following activities: 07/25/2021  Hearing? N  Vision? N  Difficulty concentrating or making decisions? N  Walking or climbing stairs? N  Dressing or bathing? N  Doing errands, shopping? N  Preparing Food and eating ? N  Using the Toilet? N  In the past six months, have you accidently leaked urine? Y  Do you have problems with loss of bowel control? N  Managing your Medications? N  Managing your Finances? N  Housekeeping or managing your Housekeeping? N  Some recent data might be hidden    Patient Care Team: [name], FNP as PCP - General ([place]) Dorothy Spark, MD (Inactive) as PCP - Cardiology (Cardiology) Rex Kras Claudette Stapler, RN as [place], Sharyn Blitz, [facility] (Pharmacist)  Indicate any recent Medical Services you may have received from other than [facility] providers in the past year (date may be approximate).     Assessment:   This is a routine wellness examination for Sarah Weaver.  Hearing/Vision screen No results found.  Dietary issues and exercise activities discussed: Current Exercise Habits: The patient does not participate in regular exercise at present   Goals Addressed             This Visit's Progress    Patient Stated       07/25/2021, no goals       Depression Screen PHQ 2/9 Scores 07/25/2021 07/11/2020 08/25/2019 05/17/2019 05/05/2019 01/27/2019 09/16/2018  PHQ - 2 Score 0 0 0 0 0 0 0  PHQ- 9 Score - 0 - - 1 - -    Fall Risk Fall Risk  07/25/2021 07/11/2020 08/25/2019 06/30/2019 06/23/2019  Falls in the past year? 0 0 0 0 0  Number falls in past yr: - - - - -  Injury with Fall? - - - - -  Risk for fall due to : Impaired balance/gait;Impaired mobility;Medication side effect Impaired mobility;Medication side  effect - - -  Follow  up Falls evaluation completed;Education provided;Falls prevention discussed Falls evaluation completed;Education provided;Falls prevention discussed - - -    FALL RISK PREVENTION PERTAINING TO THE HOME:  Any stairs in or around the home? No  If so, are there any without handrails? N/a Home free of loose throw rugs in walkways, pet beds, electrical cords, etc? Yes  Adequate lighting in your home to reduce risk of falls? Yes   ASSISTIVE DEVICES UTILIZED TO PREVENT FALLS:  Life alert? No  Use of a cane, walker or w/c? Yes  Grab bars in the bathroom? Yes  Shower chair or bench in shower? No  Elevated toilet seat or a handicapped toilet? Yes   TIMED UP AND GO:  Was the test performed? No .     Gait slow and steady with assistive device  Cognitive Function:     6CIT Screen 07/25/2021 07/11/2020 05/05/2019 08/10/2018  What Year? 0 points 0 points 0 points 0 points  What month? 0 points 0 points 0 points 0 points  What time? 0 points 0 points 0 points 0 points  Count back from 20 0 points 0 points 0 points 0 points  Months in reverse 0 points 0 points 0 points 0 points  Repeat phrase 4 points 2 points 0 points 0 points  Total Score 4 2 0 0    Immunizations Immunization History  Administered Date(s) Administered   Fluad Quad(high Dose 65+) 07/11/2020, 06/26/2021   Influenza, High Dose Seasonal PF 07/29/2017, 07/08/2018, 06/23/2019   Influenza-Unspecified 06/15/2018   PFIZER(Purple Top)SARS-COV-2 Vaccination 09/26/2020, 10/17/2020   Pneumococcal Polysaccharide-23 04/24/2011, 07/29/2017   Tdap 09/22/2018    TDAP status: Up to date  Flu Vaccine status: Up to date  Pneumococcal vaccine status: Up to date  Covid-19 vaccine status: Completed vaccines  Qualifies for Shingles Vaccine? Yes   Zostavax completed No   Shingrix Completed?: No.    Education has been provided regarding the importance of this vaccine. Patient has been advised to call insurance  company to determine out of pocket expense if they have not yet received this vaccine. Advised may also receive vaccine at local pharmacy or Health Dept. Verbalized acceptance and understanding.  Screening Tests Health Maintenance  Topic Date Due   Zoster Vaccines- Shingrix (1 of 2) Never done   Pneumonia Vaccine 22+ Years old (2 - PCV) 07/29/2018   COVID-19 Vaccine (3 - Booster for Pfizer series) 12/12/2020   FOOT EXAM  07/11/2021   OPHTHALMOLOGY EXAM  10/25/2021   HEMOGLOBIN A1C  12/25/2021   TETANUS/TDAP  09/22/2028   INFLUENZA VACCINE  Completed   DEXA SCAN  Completed   HPV VACCINES  Aged Out    Health Maintenance  Health Maintenance Due  Topic Date Due   Zoster Vaccines- Shingrix (1 of 2) Never done   Pneumonia Vaccine 83+ Years old (2 - PCV) 07/29/2018   COVID-19 Vaccine (3 - Booster for [place] series) 12/12/2020   FOOT EXAM  07/11/2021    Colorectal cancer screening: No longer required.   Mammogram status: No longer required due to age.  Bone Density status: Completed 10/11/2019. Results reflect: Bone density results: NORMAL. Repeat every 0 years.  Lung Cancer Screening: (Low Dose CT Chest recommended if Age 39-80 years, 30 pack-year currently smoking OR have quit w/in 15years.) does not qualify.   Lung Cancer Screening Referral: no  Additional Screening:  Hepatitis C Screening: does not qualify;   Vision Screening: Recommended annual ophthalmology exams for early detection of glaucoma and other disorders of  the eye. Is the patient up to date with their annual eye exam?  Yes  Who is the provider or what is the name of the office in which the patient attends annual eye exams? [place] If pt is not established with a provider, would they like to be referred to a provider to establish care? No .   Dental Screening: Recommended annual dental exams for proper oral hygiene  Community Resource Referral / Chronic Care Management: CRR required this visit?  No    CCM required this visit?  No      Plan:     I have personally reviewed and noted the following in the patient's chart:   Medical and social history Use of alcohol, tobacco or illicit drugs  Current medications and supplements including opioid prescriptions.  Functional ability and status Nutritional status Physical activity Advanced directives List of other physicians Hospitalizations, surgeries, and ER visits in previous 12 months Vitals Screenings to include cognitive, depression, and falls Referrals and appointments  In addition, I have reviewed and discussed with patient certain preventive protocols, quality metrics, and best practice recommendations. A written personalized care plan for preventive services as well as general preventive health recommendations were provided to patient.     Kellie Simmering, LPN   [id number]/52/[id number]   Nurse Notes: none

## 2021-08-12 ENCOUNTER — Telehealth: Payer: Self-pay

## 2021-08-12 NOTE — Chronic Care Management (AMB) (Signed)
  Sarah Weaver was reminded to have all medications, supplements and any blood glucose and blood pressure readings available for review with Sarah Weaver, Pharm. D, at her telephone visit on 08-13-2021 at 3:00.   Questions: Have you had any recent office visit or specialist visit outside of [facility] systems? Patient stated no  Are there any concerns you would like to discuss during your office visit? Patient stated no  Are you having any problems obtaining your medications? (Whether it pharmacy issues or cost) Patient stated no  If patient has any PAP medications ask if they are having any problems getting their PAP medication or refill? Patient stated no  Care Gaps: Shingrix overdue Covid booster overdue Yearly foot exam overdue  Star Rating Drug: Losartan 50 mg - Last filled 05-22-2021 [facility] Atorvastatin 80 mg- Last filled 04-30-2021 [facility] (Patient states medication was recently filled) Ozempic 0.25 mg- Last filled 06-19-2021 [facility] Jardiance 10 mg- Last filled 05-14-2021 [facility]  Any gaps in medications fill history? No  Sarah Weaver [facility] Clinical Pharmacist Assistant [phone]

## 2021-08-13 ENCOUNTER — Ambulatory Visit: Payer: Medicare Other

## 2021-08-13 DIAGNOSIS — I10 Essential (primary) hypertension: Secondary | ICD-10-CM

## 2021-08-13 DIAGNOSIS — E119 Type 2 diabetes mellitus without complications: Secondary | ICD-10-CM

## 2021-08-13 NOTE — Progress Notes (Signed)
Chronic Care Management Pharmacy Note  08/13/2021 Name:  Sarah Weaver MRN:  [id number] DOB:  [birth year]  Summary: Patient reports that she is doing well. She is taking her medication everyday.   Recommendations/Changes made from today's visit: Recommend patient get COVID-19  booster shot. Recommend patient  start checking her BP at least once per day, due to discontinuation of Amlodipine until patient is seen by PCP.   Plan: Patient will log BP readings and bring BP cuff at her next office visit.    Subjective: Sarah Weaver is an 85 y.o. year old female who is a primary patient of [name], [place].  The CCM team was consulted for assistance with disease management and care coordination needs.    Engaged with patient by telephone for follow up visit in response to provider referral for pharmacy case management and/or care coordination services. Patient reports that she is doing well.   Consent to Services:  The patient was given information about Chronic Care Management services, agreed to services, and gave verbal consent prior to initiation of services.  Please see initial visit note for detailed documentation.   Patient Care Team: [name], FNP as PCP - General (General Practice) Dorothy Spark, MD (Inactive) as PCP - Cardiology (Cardiology) Lynne Logan, RN as [facility] Mayford Knife, [facility] (Pharmacist)  Recent office visits: 06/26/2021 PCP OV  03/19/2021 PCP OV   Recent consult visits: 02/12/2021 Specialty [facility] visits: None in previous 6 months   Objective:  Lab Results  Component Value Date   CREATININE 1.23 (H) 06/26/2021   BUN 19 06/26/2021   GFRNONAA 30 (L) 11/06/2020   GFRAA 34 (L) 11/06/2020   NA 140 06/26/2021   K 4.4 06/26/2021   CALCIUM 9.7 06/26/2021   CO2 23 06/26/2021   GLUCOSE 135 (H) 06/26/2021    Lab Results  Component Value Date/Time   HGBA1C 7.2 (H)  06/26/2021 05:06 PM   HGBA1C 7.3 (H) 11/06/2020 12:02 PM    Last diabetic Eye exam:  Lab Results  Component Value Date/Time   HMDIABEYEEXA Retinopathy (A) 10/25/2020 12:00 AM    Last diabetic Foot exam: No results found for: HMDIABFOOTEX   Lab Results  Component Value Date   CHOL 121 06/26/2021   HDL 57 06/26/2021   LDLCALC 50 06/26/2021   TRIG 69 06/26/2021   CHOLHDL 2.1 06/26/2021    Hepatic Function Latest Ref Rng & Units 06/26/2021 11/06/2020 07/11/2020  Total Protein 6.0 - 8.5 g/dL 6.9 7.1 7.5  Albumin 3.6 - 4.6 g/dL 4.3 4.1 4.3  AST 0 - 40 IU/L _0 ALT 0 - 32 IU/L 34(H) 21 23  Alk Phosphatase 44 - 121 IU/L 98 95 90  Total Bilirubin 0.0 - 1.2 mg/dL 0.4 0.5 0.2  Bilirubin, Direct 0.00 - 0.40 mg/dL - - -    Lab Results  Component Value Date/Time   TSH 1.770 03/19/2021 12:44 PM   TSH 2.660 12/27/2019 01:10 PM   FREET4 1.32 05/05/2019 02:34 PM    CBC Latest Ref Rng & Units 07/11/2020 05/04/2020 02/16/2020  WBC 3.4 - 10.8 x10E3/uL 4.2 2.8(L) 3.7  Hemoglobin 11.1 - 15.9 g/dL 12.5 11.4(L) 12.1  Hematocrit 34.0 - 46.6 % 39.3 36.8 36.5  Platelets 150 - 450 x10E3/uL 204 157 211    No results found for: VD25OH  Clinical ASCVD: Yes  The ASCVD Risk score (Arnett DK, et al., 2019) failed to calculate for the following  reasons:   The 2019 ASCVD risk score is only valid for ages 54 to 55   The patient has a prior MI or stroke diagnosis    Depression screen [facility] 2/9 07/25/2021 07/11/2020 08/25/2019  Decreased Interest 0 0 0  Down, Depressed, Hopeless 0 0 0  PHQ - 2 Score 0 0 0  Altered sleeping - 0 -  Tired, decreased energy - 0 -  Change in appetite - 0 -  Feeling bad or failure about yourself  - 0 -  Trouble concentrating - 0 -  Moving slowly or fidgety/restless - 0 -  Suicidal thoughts - 0 -  PHQ-9 Score - 0 -  Difficult doing work/chores - Not difficult at all -     Social History   Tobacco Use  Smoking Status Never  Smokeless Tobacco Never   BP  Readings from Last 3 Encounters:  07/25/21 (!) 148/82  06/26/21 130/72  03/19/21 134/80   Pulse Readings from Last 3 Encounters:  07/25/21 81  06/26/21 82  03/19/21 75   Wt Readings from Last 3 Encounters:  07/25/21 (!) 303 lb 3.2 oz (137.5 kg)  06/26/21 (!) 308 lb (139.7 kg)  03/19/21 299 lb 3.2 oz (135.7 kg)   BMI Readings from Last 3 Encounters:  07/25/21 57.29 kg/m  06/26/21 57.44 kg/m  03/19/21 50.10 kg/m    Assessment/Interventions: Review of patient past medical history, allergies, medications, health status, including review of consultants reports, laboratory and other test data, was performed as part of comprehensive evaluation and provision of chronic care management services.   SDOH:  (Social Determinants of Health) assessments and interventions performed: No  SDOH Screenings   Alcohol Screen: Not on file  Depression (PHQ2-9): Low Risk    PHQ-2 Score: 0  Financial Resource Strain: Low Risk    Difficulty of Paying Living Expenses: Not hard at all  Food Insecurity: No Food Insecurity   Worried About Charity fundraiser in the Last Year: Never true   Ran Out of Food in the Last Year: Never true  Housing: Low Risk    Last Housing Risk Score: 0  Physical Activity: Inactive   Days of Exercise per Week: 0 days   Minutes of Exercise per Session: 0 min  Social Connections: Not on file  Stress: No Stress Concern Present   Feeling of Stress : Not at all  Tobacco Use: Low Risk    Smoking Tobacco Use: Never   Smokeless Tobacco Use: Never   Passive Exposure: Not on file  Transportation Needs: No Transportation Needs   Lack of Transportation (Medical): No   Lack of Transportation (Non-Medical): No    CCM Care Plan  Allergies  Allergen Reactions   Carvedilol Cough    Pt reports causes her to cough   Lisinopril Cough    REPORTS CAUSES DRY COUGH    Medications Reviewed Today     Reviewed by Mayford Knife, RPH (Pharmacist) on 08/13/21 at 1539  Med List  Status: <None>   Medication Order Taking? Sig Documenting Provider Last Dose Status Informant  Accu-Chek Softclix Lancets lancets [id number]  USE UP TO 4 TIMES DAILY AS DIRECTED [name], FNP  Active   albuterol (VENTOLIN HFA) 108 (90 Base) MCG/ACT inhaler [id number]  Inhale 2 puffs into the lungs every 6 (six) hours as needed for wheezing or shortness of breath.  Patient not taking: Reported on 07/25/2021   Bary Castilla, NP  Active   amLODipine (NORVASC) 5 MG tablet [id number]  Take 1 tablet (5 mg total) by mouth daily.  Patient not taking: No sig reported   [name], FNP  Active            Med Note (LITTLE, ANGEL L   Tue Jul 23, 2021  6:10 PM) Nephrologist is holding Amlodipine  aspirin EC 81 MG tablet [id number]  Take 81 mg by mouth daily. [provider]  Active Self  atorvastatin (LIPITOR) 80 MG tablet [id number]  Take 1 tablet by mouth at bedtime [name], FNP  Active   benzonatate (TESSALON PERLES) 100 MG capsule [id number]  Take 1 capsule (100 mg total) by mouth 3 (three) times daily as needed for cough.  Patient not taking: Reported on 07/25/2021   Bary Castilla, NP  Active   blood glucose meter kit and supplies KIT [id number]  Dispense based on patient and insurance preference. Use up to four times daily as directed. (FOR ICD-9 250.00, 250.01). [name], FNP  Active   brinzolamide (AZOPT) 1 % ophthalmic suspension [id number]  Place 1 drop into both eyes 3 (three) times daily. [provider]  Active Self  Cholecalciferol 125 MCG (5000 UT) TABS [id number]  Take 1 tablet by mouth daily. [provider]  Active   empagliflozin (JARDIANCE) 10 MG TABS tablet [id number]  Take 1 tablet (10 mg total) by mouth daily before breakfast. Freada Bergeron, MD  Active   ezetimibe (ZETIA) 10 MG tablet [id number]  TAKE 1 TABLET BY MOUTH EVERY DAY Weaver, Scott T, PA-C  Active   furosemide (LASIX) 40 MG tablet [id number]  Take 1 tablet (40 mg total) by  mouth daily. Freada Bergeron, MD  Active   insulin degludec (TRESIBA FLEXTOUCH) 100 UNIT/ML FlexTouch Pen [id number]  Inject 15 Units into the skin daily. [name], FNP  Active   Insulin Pen Needle (BD PEN NEEDLE NANO U/F) 32G X 4 MM MISC [id number]  USE NIGHTLY AS DIRECTED [name], FNP  Active   Latanoprostene Bunod 0.024 % SOLN [id number]  Apply 1 drop to eye daily. [provider]  Active Self  levothyroxine (SYNTHROID) 50 MCG tablet [id number]  TAKE 1 TABLET BY MOUTH EVERY DAY BEFORE Evelena Peat, FNP  Active   LINZESS 145 MCG CAPS capsule [id number]  TAKE 1 CAPSULE (145 MCG TOTAL) BY MOUTH DAILY BEFORE BREAKFAST. FOR CONSTIPATION [name], FNP  Active   losartan (COZAAR) 50 MG tablet [id number]  Take 50 mg by mouth at bedtime. Rexene Agent, MD  Active   Magnesium 250 MG TABS [id number]  Take 1 tablet (250 mg total) by mouth every evening. Take with evening meal  Patient not taking: Reported on 07/25/2021   [name], FNP  Active   nebivolol (BYSTOLIC) 2.5 MG tablet [id number]  Take 1 tablet (2.5 mg total) by mouth daily. Freada Bergeron, MD  Active   nystatin (NYSTATIN) powder [id number]  Apply 1 application topically 3 (three) times daily.  Patient not taking: Reported on 07/25/2021   [name], FNP  Active   [facility] VERIO test strip [id number]  CHECK BLOOD SUGARS ONCE DAILY Rodriguez-Southworth, Sandrea Matte  Active   OZEMPIC, 0.25 OR 0.5 MG/DOSE, 2 MG/1.5ML SOPN [id number]  INJECT 0.25 MG INTO THE SKIN ONCE A WEEK. [name], FNP  Active   traMADol-acetaminophen (ULTRACET) 37.5-325 MG tablet [id number]  TAKE 1 TABLET BY MOUTH EVERY 6 HOURS AS NEEDED FOR MODERATE BACK PAIN [name], FNP  Active  Patient Active Problem List   Diagnosis Date Noted   Class 3 severe obesity due to excess calories with serious comorbidity in adult [facility]) 08/25/2019   Diabetes mellitus due to underlying condition with stage 3a chronic kidney  disease, with long-term current use of insulin ([place]) 06/16/2019   CAD (coronary artery disease) 07/26/2018   Chronic diastolic CHF (congestive heart failure) ([place]) 07/26/2018   Chronic kidney disease (CKD), stage III (moderate) (HCC) 07/26/2018   Chest pain, moderate coronary artery risk 12/20/2015   DM2 (diabetes mellitus, type 2) ([place]) 12/20/2015   HTN (hypertension) 12/20/2015   HLD (hyperlipidemia) 12/20/2015   Ischemic chest pain ([place]) 12/20/2015   NSTEMI (non-ST elevated myocardial infarction) ([place])    Hypertensive heart disease without heart failure    Acute renal failure ([place])    Cholelithiasis 04/14/2011    Immunization History  Administered Date(s) Administered   Fluad Quad(high Dose 65+) 07/11/2020, 06/26/2021   Influenza, High Dose Seasonal PF 07/29/2017, 07/08/2018, 06/23/2019   Influenza-Unspecified 06/15/2018   PFIZER(Purple Top)SARS-COV-2 Vaccination 09/26/2020, 10/17/2020   Pneumococcal Polysaccharide-23 04/24/2011, 07/29/2017   Tdap 09/22/2018    Conditions to be addressed/monitored:  Hyperlipidemia and Diabetes  Care Plan : [facility]  Updates made by Mayford Knife, [place] since 08/13/2021 12:00 AM     Problem: HTN, DM II   Priority: High     Long-Range Goal: Patient Stated   Start Date: 12/12/2020  Recent Progress: On track  Priority: High  Note:      Current Barriers:  Does not maintain contact with provider office Does not contact provider office for questions/concerns  Pharmacist Clinical Goal(s):  Patient will achieve adherence to monitoring guidelines and medication adherence to achieve therapeutic efficacy through collaboration with PharmD and provider.   Interventions: 1:1 collaboration with [name], FNP regarding development and update of comprehensive plan of care as evidenced by provider attestation and co-signature Inter-disciplinary care team collaboration (see longitudinal plan of care) Comprehensive medication  review performed; medication list updated in electronic medical record  Hypertension (BP goal <140/90) -Uncontrolled -Current treatment: Losartan 50 mg tablet once per day at bedtime Bystolic 2.5 mg tablet daily  Furosemide 40 mg tablet daily Amlodipine 5 mg tablet once per day  Discontinued by specialist due to swelling.  -Current home readings: 142/72 -Current dietary habits: patient is not using any salt  -Denies hypotensive/hypertensive symptoms -Educated on Importance of home blood pressure monitoring; Proper BP monitoring technique; Symptoms of hypotension and importance of maintaining adequate hydration; -Patient reports that she gets nervous and afraid of her BP readings.  -Counseled to monitor BP at home monitor at least four times per week , document, and provide log at future appointments -Recommended to continue current medication  Diabetes (A1c goal <8%) -Controlled -Current medications: Ozempic 0.25 mg once per week  Recommend patients medication be increased to 0.5 mg  Jardiance 10 mg tablet once per day Tresiba - injecting 15 units daily -Current home glucose readings fasting glucose: 118,124  -Denies hypoglycemic/hyperglycemic symptoms -Current meal patterns:  breakfast: [place] bacon, [place] sausage, or chicken bacon, boiled   , cereal using 1% milk lunch: does not eat lunch  dinner: she is eating two vegetables every day and her meat, an example being chicken and cabbage or spinach, or kale drinks: water 4-5 bottles per day (16.9 ounces)   -Current exercise: patient starts that she is not exercising at this time  -Educated on A1c and blood sugar goals; Complications of diabetes including kidney damage, retinal  damage, and cardiovascular disease;  -Counseled to check feet daily and get yearly eye exams -Counseled on diet and exercise extensively Recommended to continue current medication  Patient Goals/Self-Care Activities Patient will:  - take  medications as prescribed as evidenced by patient report and record review  Follow Up Plan: The patient has been provided with contact information for the care management team and has been advised to call with any health related questions or concerns.       Medication Assistance: None required.  Patient affirms current coverage meets needs.  Compliance/Adherence/Medication fill history: Care Gaps: Shingrix Vaccine COVID-19 Vaccine - to schedule COVID shot  Foot Exam  Star-Rating Drugs: Atorvastatin 80 mg tablet  Jardiance 10 mg tablet  Losartan 50 mg tablet  Ozempic 0.25 mg   Patient's preferred pharmacy [facility]:  [id number] [place] [place] - [address] [place]Phone: [phone]: [phone] [facility] [place] [place]Phone: [phone]: [phone] Uses pill box? Yes Pt endorses 90% compliance  We discussed: Current pharmacy is preferred with insurance plan and patient is satisfied with pharmacy services Patient decided to: Continue current medication management strategy  Care Plan and Follow Up Patient Decision:  Patient agrees to Care Plan and Follow-up.  Plan: The patient has been provided with contact information for the care management team and has been advised to call with any health related questions or concerns.   VOrlando Penner CPP, PharmD Clinical Pharmacist Practitioner [facility] [phone]

## 2021-08-13 NOTE — Patient Instructions (Addendum)
Visit Information It was great speaking with you today!  Please let me know if you have any questions about our visit.   Goals Addressed             This Visit's Progress    Manage My Medicine       Timeframe:  Long-Range Goal Priority:  High Start Date:                             Expected End Date:                       Follow Up Date: 12/03/2021   - call for medicine refill 2 or 3 days before it runs out - call if I am sick and can't take my medicine - keep a list of all the medicines I take; vitamins and herbals too - use a pillbox to sort medicine - use an alarm clock or phone to remind me to take my medicine    Why is this important?   These steps will help you keep on track with your medicines.           Patient Care Plan: CCM Pharmacy Care Plan     Problem Identified: HTN, DM II   Priority: High     Long-Range Goal: Patient Stated   Start Date: 12/12/2020  Recent Progress: On track  Priority: High  Note:      Current Barriers:  Does not maintain contact with provider office Does not contact provider office for questions/concerns  Pharmacist Clinical Goal(s):  Patient will achieve adherence to monitoring guidelines and medication adherence to achieve therapeutic efficacy through collaboration with PharmD and provider.   Interventions: 1:1 collaboration with [name], FNP regarding development and update of comprehensive plan of care as evidenced by provider attestation and co-signature Inter-disciplinary care team collaboration (see longitudinal plan of care) Comprehensive medication review performed; medication list updated in electronic medical record  Hypertension (BP goal <140/90) -Uncontrolled -Current treatment: Losartan 50 mg tablet once per day at bedtime Bystolic 2.5 mg tablet daily  Furosemide 40 mg tablet daily Amlodipine 5 mg tablet once per day  Discontinued by specialist due to swelling.  -Current home readings:  142/72 -Current dietary habits: patient is not using any salt  -Denies hypotensive/hypertensive symptoms -Educated on Importance of home blood pressure monitoring; Proper BP monitoring technique; Symptoms of hypotension and importance of maintaining adequate hydration; -Patient reports that she gets nervous and afraid of her BP readings.  -Counseled to monitor BP at home monitor at least four times per week , document, and provide log at future appointments -Recommended to continue current medication  Diabetes (A1c goal <8%) -Controlled -Current medications: Ozempic 0.25 mg once per week  Recommend patients medication be increased to 0.5 mg  Jardiance 10 mg tablet once per day Tresiba - injecting 15 units daily -Current home glucose readings fasting glucose: 118,124  -Denies hypoglycemic/hyperglycemic symptoms -Current meal patterns:  breakfast: [place] bacon, [place] sausage, or chicken bacon, boiled   , cereal using 1% milk lunch: does not eat lunch  dinner: she is eating two vegetables every day and her meat, an example being chicken and cabbage or spinach, or kale drinks: water 4-5 bottles per day (16.9 ounces)   -Current exercise: patient starts that she is not exercising at this time  -Educated on A1c and blood sugar goals; Complications of diabetes including kidney damage, retinal damage, and  cardiovascular disease;  -Counseled to check feet daily and get yearly eye exams -Counseled on diet and exercise extensively Recommended to continue current medication  Patient Goals/Self-Care Activities Patient will:  - take medications as prescribed as evidenced by patient report and record review  Follow Up Plan: The patient has been provided with contact information for the care management team and has been advised to call with any health related questions or concerns.        Patient agreed to services and verbal consent obtained.   The patient verbalized understanding of  instructions, educational materials, and care plan provided today and agreed to receive a mailed copy of patient instructions, educational materials, and care plan.   Cherylin Mylar, PharmD Clinical Pharmacist [facility] [phone]

## 2021-08-14 DIAGNOSIS — I10 Essential (primary) hypertension: Secondary | ICD-10-CM

## 2021-08-14 DIAGNOSIS — N1831 Chronic kidney disease, stage 3a: Secondary | ICD-10-CM

## 2021-08-14 DIAGNOSIS — E119 Type 2 diabetes mellitus without complications: Secondary | ICD-10-CM

## 2021-08-14 DIAGNOSIS — E782 Mixed hyperlipidemia: Secondary | ICD-10-CM

## 2021-08-20 ENCOUNTER — Other Ambulatory Visit: Payer: Self-pay

## 2021-08-20 ENCOUNTER — Encounter: Payer: Self-pay | Admitting: Nurse Practitioner

## 2021-08-20 ENCOUNTER — Ambulatory Visit (INDEPENDENT_AMBULATORY_CARE_PROVIDER_SITE_OTHER): Payer: Medicare Other | Admitting: Nurse Practitioner

## 2021-08-20 VITALS — BP 122/80 | HR 72 | Temp 98.4°F | Ht 60.8 in | Wt 298.4 lb

## 2021-08-20 DIAGNOSIS — N1832 Chronic kidney disease, stage 3b: Secondary | ICD-10-CM | POA: Diagnosis not present

## 2021-08-20 DIAGNOSIS — E1122 Type 2 diabetes mellitus with diabetic chronic kidney disease: Secondary | ICD-10-CM | POA: Diagnosis not present

## 2021-08-20 DIAGNOSIS — Z6841 Body Mass Index (BMI) 40.0 and over, adult: Secondary | ICD-10-CM

## 2021-08-20 DIAGNOSIS — E662 Morbid (severe) obesity with alveolar hypoventilation: Secondary | ICD-10-CM

## 2021-08-20 DIAGNOSIS — Z0000 Encounter for general adult medical examination without abnormal findings: Secondary | ICD-10-CM

## 2021-08-20 NOTE — Patient Instructions (Signed)

## 2021-08-20 NOTE — Progress Notes (Signed)
I,Victoria T Hamilton,acting as a Education administrator for [name], FNP.,have documented all relevant documentation on the behalf of [name], FNP,as directed by  [name], FNP while in the presence of [name], [place].   This visit occurred during the SARS-CoV-2 public health emergency.  Safety protocols were in place, including screening questions prior to the visit, additional usage of staff PPE, and extensive cleaning of exam room while observing appropriate contact time as indicated for disinfecting solutions.  Subjective:     Patient ID: Sarah Weaver , female    DOB: [birth year] , 85 y.o.   MRN: [id number]   Chief Complaint  Patient presents with   Annual Exam    HPI  Pt presents today for HM. She went to see her nephrologist last month no changes.   Wt Readings from Last 3 Encounters: 08/20/21 : 298 lb 6.4 oz (135.4 kg) 07/25/21 : (!) 303 lb 3.2 oz (137.5 kg) 06/26/21 : (!) 308 lb (139.7 kg)      Past Medical History:  Diagnosis Date   Arthritis    "all over"   Chronic diastolic CHF (congestive heart failure) ([place])    Echocardiogram 12/2019: EF 60-65, no RWMA, mild LVH, Gr 1 DD, trivial MR, mild AoV sclerosis, no AS   Chronic lower back pain    CKD (chronic kidney disease), stage III (HCC)    Coronary artery disease    a. status post DES to the LAD 12/2015.   Glaucoma    Glaucoma of both eyes    Hyperkalemia    Hyperlipidemia    Hypertension    Type II diabetes mellitus ([place])      Family History  Problem Relation Age of Onset   Heart disease Mother    Heart attack Mother    Hypertension Mother    Heart disease Sister    Cancer Sister    Stroke Sister      Current Outpatient Medications:    Accu-Chek Softclix Lancets lancets, USE UP TO 4 TIMES DAILY AS DIRECTED, Disp: 100 each, Rfl: 3   albuterol (VENTOLIN HFA) 108 (90 Base) MCG/ACT inhaler, Inhale 2 puffs into the lungs every 6 (six) hours as needed for wheezing or shortness of breath., Disp:  8 g, Rfl: 2   aspirin EC 81 MG tablet, Take 81 mg by mouth daily., Disp: , Rfl:    atorvastatin (LIPITOR) 80 MG tablet, Take 1 tablet by mouth at bedtime, Disp: 90 tablet, Rfl: 2   benzonatate (TESSALON PERLES) 100 MG capsule, Take 1 capsule (100 mg total) by mouth 3 (three) times daily as needed for cough., Disp: 30 capsule, Rfl: 1   blood glucose meter kit and supplies KIT, Dispense based on patient and insurance preference. Use up to four times daily as directed. (FOR ICD-9 250.00, 250.01)., Disp: 1 each, Rfl: 0   brinzolamide (AZOPT) 1 % ophthalmic suspension, Place 1 drop into both eyes 3 (three) times daily., Disp: , Rfl:    Cholecalciferol 125 MCG (5000 UT) TABS, Take 1 tablet by mouth daily., Disp: , Rfl:    empagliflozin (JARDIANCE) 10 MG TABS tablet, Take 1 tablet (10 mg total) by mouth daily before breakfast., Disp: 90 tablet, Rfl: 3   ezetimibe (ZETIA) 10 MG tablet, TAKE 1 TABLET BY MOUTH EVERY DAY, Disp: 90 tablet, Rfl: 3   furosemide (LASIX) 40 MG tablet, Take 1 tablet (40 mg total) by mouth daily., Disp: 90 tablet, Rfl: 3   insulin degludec (TRESIBA FLEXTOUCH) 100 UNIT/ML FlexTouch Pen,  Inject 15 Units into the skin daily., Disp: 15 mL, Rfl: 3   Insulin Pen Needle (BD PEN NEEDLE NANO U/F) 32G X 4 MM MISC, USE NIGHTLY AS DIRECTED, Disp: 100 each, Rfl: 3   Latanoprostene Bunod 0.024 % SOLN, Apply 1 drop to eye daily., Disp: , Rfl:    levothyroxine (SYNTHROID) 50 MCG tablet, TAKE 1 TABLET BY MOUTH EVERY DAY BEFORE BREAKFAST, Disp: 90 tablet, Rfl: 1   LINZESS 145 MCG CAPS capsule, TAKE 1 CAPSULE (145 MCG TOTAL) BY MOUTH DAILY BEFORE BREAKFAST. FOR CONSTIPATION, Disp: 90 capsule, Rfl: 0   losartan (COZAAR) 50 MG tablet, Take 50 mg by mouth at bedtime., Disp: , Rfl:    Magnesium 250 MG TABS, Take 1 tablet (250 mg total) by mouth every evening. Take with evening meal, Disp: 90 tablet, Rfl: 1   nebivolol (BYSTOLIC) 2.5 MG tablet, Take 1 tablet (2.5 mg total) by mouth daily., Disp: 90 tablet,  Rfl: 1   nystatin (NYSTATIN) powder, Apply 1 application topically 3 (three) times daily., Disp: 15 g, Rfl: 0   ONETOUCH VERIO test strip, CHECK BLOOD SUGARS ONCE DAILY, Disp: 50 strip, Rfl: 11   OZEMPIC, 0.25 OR 0.5 MG/DOSE, 2 MG/1.5ML SOPN, INJECT 0.25 MG INTO THE SKIN ONCE A WEEK., Disp: 3 mL, Rfl: 3   traMADol-acetaminophen (ULTRACET) 37.5-325 MG tablet, TAKE 1 TABLET BY MOUTH EVERY 6 HOURS AS NEEDED FOR MODERATE BACK PAIN, Disp: 20 tablet, Rfl: 0   amLODipine (NORVASC) 5 MG tablet, Take 1 tablet (5 mg total) by mouth daily. (Patient not taking: Reported on 07/23/2021), Disp: 90 tablet, Rfl: 0   Allergies  Allergen Reactions   Carvedilol Cough    Pt reports causes her to cough   Lisinopril Cough    REPORTS CAUSES DRY COUGH      The patient states she is post menopausal status for birth control.  No LMP recorded. Patient is postmenopausal.. Negative for Dysmenorrhea and Negative for Menorrhagia. Negative for: breast discharge, breast lump(s), breast pain and breast self exam. Associated symptoms include abnormal vaginal bleeding. Pertinent negatives include abnormal bleeding (hematology), anxiety, decreased libido, depression, difficulty falling sleep, dyspareunia, history of infertility, nocturia, sexual dysfunction, sleep disturbances, urinary incontinence, urinary urgency, vaginal discharge and vaginal itching. Diet regular; eats vegetables, chicken and fish, drinks "plenty" of water, likes fruit.  The patient states her exercise level is minimal - trying to get up to walk more   The patient's tobacco use is:  Social History   Tobacco Use  Smoking Status Never  Smokeless Tobacco Never   She has been exposed to passive smoke. The patient's alcohol use is:  Social History   Substance and Sexual Activity  Alcohol Use No    Review of Systems  Constitutional: Negative.   HENT: Negative.    Eyes: Negative.   Respiratory: Negative.    Cardiovascular: Negative.   Gastrointestinal:  Negative.   Endocrine: Negative.   Genitourinary: Negative.   Musculoskeletal: Negative.   Skin: Negative.   Allergic/Immunologic: Negative.   Neurological: Negative.   Hematological: Negative.   Psychiatric/Behavioral: Negative.      Today's Vitals   08/20/21 1132  BP: 122/80  Pulse: 72  Temp: 98.4 F (36.9 C)  Weight: 298 lb 6.4 oz (135.4 kg)  Height: 5' 0.8" (1.544 m)  PainSc: 0-No pain   Body mass index is 56.75 kg/m.   Objective:  Physical Exam Constitutional:      General: She is not in acute distress.    Appearance: Normal  appearance. She is well-developed. She is obese.  HENT:     Head: Normocephalic and atraumatic.     Right Ear: Hearing, tympanic membrane, ear canal and external ear normal. There is no impacted cerumen.     Left Ear: Hearing, tympanic membrane, ear canal and external ear normal. There is no impacted cerumen.     Nose:     Comments: Deferred - masked    Mouth/Throat:     Comments: Deferred - masked Eyes:     General: Lids are normal.     Extraocular Movements: Extraocular movements intact.     Conjunctiva/sclera: Conjunctivae normal.     Pupils: Pupils are equal, round, and reactive to light.     Funduscopic exam:    Right eye: No papilledema.        Left eye: No papilledema.  Neck:     Thyroid: No thyroid mass.     Vascular: No carotid bruit.  Cardiovascular:     Rate and Rhythm: Normal rate and regular rhythm.     Pulses: Normal pulses.     Heart sounds: Normal heart sounds. No murmur heard. Pulmonary:     Effort: Pulmonary effort is normal. No respiratory distress.     Breath sounds: Normal breath sounds. No wheezing.  Chest:     Chest wall: No mass.  Breasts:    Tanner Score is 5.     Right: Normal. No mass or tenderness.     Left: Normal. No mass or tenderness.  Abdominal:     General: Abdomen is flat. Bowel sounds are normal. There is no distension.     Palpations: Abdomen is soft.     Tenderness: There is no abdominal  tenderness.  Genitourinary:    Comments: Deferred Musculoskeletal:        General: No swelling. Normal range of motion.     Cervical back: Full passive range of motion without pain, normal range of motion and neck supple.     Right lower leg: Edema (trace - wearing support socks) present.     Left lower leg: Edema (trace - wearing support socks) present.  Lymphadenopathy:     Upper Body:     Right upper body: No supraclavicular, axillary or pectoral adenopathy.     Left upper body: No supraclavicular, axillary or pectoral adenopathy.  Skin:    General: Skin is warm and dry.     Capillary Refill: Capillary refill takes less than 2 seconds.     Findings: No rash.  Neurological:     General: No focal deficit present.     Mental Status: She is alert and oriented to person, place, and time.     Cranial Nerves: No cranial nerve deficit.     Sensory: No sensory deficit.     Motor: No weakness.  Psychiatric:        Mood and Affect: Mood normal.        Behavior: Behavior normal.        Thought Content: Thought content normal.        Judgment: Judgment normal.        Assessment And Plan:     1. Encounter for health maintenance examination Behavior modifications discussed and diet history reviewed.   Pt will continue to exercise regularly and modify diet with low GI, plant based foods and decrease intake of processed foods.  Recommend intake of daily multivitamin, Vitamin D, and calcium.  Recommend mammogram and colonoscopy (aged out for both) for preventive screenings,  as well as recommend immunizations that include influenza, TDAP, and Shingles  2. Class 3 obesity with alveolar hypoventilation and body mass index (BMI) of 50.0 to 59.9 in adult, unspecified whether serious comorbidity present (HCC) Chronic Discussed healthy diet and regular exercise options - with chair exercises Encouraged to exercise at least 150 minutes per week with 2 days of strength training  3. Type 2 diabetes  mellitus with stage 3b chronic kidney disease, without long-term current use of insulin ([place]) She is doing well with her current dose.  Diabetic foot exam done, normal findings.    Patient was given opportunity to ask questions. Patient verbalized understanding of the plan and was able to repeat key elements of the plan. All questions were answered to their satisfaction.   [name], FNP   I, [name], FNP, have reviewed all documentation for this visit. The documentation on 08/20/21 for the exam, diagnosis, procedures, and orders are all accurate and complete.   THE PATIENT IS ENCOURAGED TO PRACTICE SOCIAL DISTANCING DUE TO THE COVID-19 PANDEMIC.

## 2021-08-22 ENCOUNTER — Telehealth: Payer: Medicare Other

## 2021-08-22 ENCOUNTER — Ambulatory Visit: Payer: Self-pay

## 2021-08-22 DIAGNOSIS — N1832 Chronic kidney disease, stage 3b: Secondary | ICD-10-CM

## 2021-08-22 DIAGNOSIS — I10 Essential (primary) hypertension: Secondary | ICD-10-CM

## 2021-08-22 DIAGNOSIS — N1831 Chronic kidney disease, stage 3a: Secondary | ICD-10-CM

## 2021-08-22 DIAGNOSIS — E1122 Type 2 diabetes mellitus with diabetic chronic kidney disease: Secondary | ICD-10-CM

## 2021-08-22 DIAGNOSIS — E782 Mixed hyperlipidemia: Secondary | ICD-10-CM

## 2021-08-23 NOTE — Chronic Care Management (AMB) (Signed)
Chronic Care Management   CCM RN Visit Note  08/22/2021 Name: Sarah Weaver MRN: [id number] DOB: 1935/09/17  Subjective: Sarah Weaver is a 85 y.o. year old female who is a primary care patient of [name], [place]. The care management team was consulted for assistance with disease management and care coordination needs.    Engaged with patient by telephone for follow up visit in response to provider referral for case management and/or care coordination services.   Consent to Services:  The patient was given information about Chronic Care Management services, agreed to services, and gave verbal consent prior to initiation of services.  Please see initial visit note for detailed documentation.   Patient agreed to services and verbal consent obtained.   Assessment: Review of patient past medical history, allergies, medications, health status, including review of consultants reports, laboratory and other test data, was performed as part of comprehensive evaluation and provision of chronic care management services.   SDOH (Social Determinants of Health) assessments and interventions performed:  Yes, no acute challenges   CCM Care Plan  Allergies  Allergen Reactions   Carvedilol Cough    Pt reports causes her to cough   Lisinopril Cough    REPORTS CAUSES DRY COUGH    Outpatient Encounter Medications as of 08/22/2021  Medication Sig Note   Accu-Chek Softclix Lancets lancets USE UP TO 4 TIMES DAILY AS DIRECTED    albuterol (VENTOLIN HFA) 108 (90 Base) MCG/ACT inhaler Inhale 2 puffs into the lungs every 6 (six) hours as needed for wheezing or shortness of breath.    amLODipine (NORVASC) 5 MG tablet Take 1 tablet (5 mg total) by mouth daily. (Patient not taking: Reported on 07/23/2021) 07/23/2021: Nephrologist is holding Amlodipine   aspirin EC 81 MG tablet Take 81 mg by mouth daily.    atorvastatin (LIPITOR) 80 MG tablet Take 1 tablet by mouth at bedtime     benzonatate (TESSALON PERLES) 100 MG capsule Take 1 capsule (100 mg total) by mouth 3 (three) times daily as needed for cough.    blood glucose meter kit and supplies KIT Dispense based on patient and insurance preference. Use up to four times daily as directed. (FOR ICD-9 250.00, 250.01).    brinzolamide (AZOPT) 1 % ophthalmic suspension Place 1 drop into both eyes 3 (three) times daily.    Cholecalciferol 125 MCG (5000 UT) TABS Take 1 tablet by mouth daily.    empagliflozin (JARDIANCE) 10 MG TABS tablet Take 1 tablet (10 mg total) by mouth daily before breakfast.    ezetimibe (ZETIA) 10 MG tablet TAKE 1 TABLET BY MOUTH EVERY DAY    furosemide (LASIX) 40 MG tablet Take 1 tablet (40 mg total) by mouth daily.    insulin degludec (TRESIBA FLEXTOUCH) 100 UNIT/ML FlexTouch Pen Inject 15 Units into the skin daily.    Insulin Pen Needle (BD PEN NEEDLE NANO U/F) 32G X 4 MM MISC USE NIGHTLY AS DIRECTED    Latanoprostene Bunod 0.024 % SOLN Apply 1 drop to eye daily.    levothyroxine (SYNTHROID) 50 MCG tablet TAKE 1 TABLET BY MOUTH EVERY DAY BEFORE BREAKFAST    LINZESS 145 MCG CAPS capsule TAKE 1 CAPSULE (145 MCG TOTAL) BY MOUTH DAILY BEFORE BREAKFAST. FOR CONSTIPATION    losartan (COZAAR) 50 MG tablet Take 50 mg by mouth at bedtime.    Magnesium 250 MG TABS Take 1 tablet (250 mg total) by mouth every evening. Take with evening meal    nebivolol (BYSTOLIC) 2.5 MG  tablet Take 1 tablet (2.5 mg total) by mouth daily.    nystatin (NYSTATIN) powder Apply 1 application topically 3 (three) times daily.    ONETOUCH VERIO test strip CHECK BLOOD SUGARS ONCE DAILY    OZEMPIC, 0.25 OR 0.5 MG/DOSE, 2 MG/1.5ML SOPN INJECT 0.25 MG INTO THE SKIN ONCE A WEEK.    traMADol-acetaminophen (ULTRACET) 37.5-325 MG tablet TAKE 1 TABLET BY MOUTH EVERY 6 HOURS AS NEEDED FOR MODERATE BACK PAIN    No facility-administered encounter medications on file as of 08/22/2021.    Patient Active Problem List   Diagnosis Date Noted   Class  3 severe obesity due to excess calories with serious comorbidity in adult [facility]) 08/25/2019   Diabetes mellitus due to underlying condition with stage 3a chronic kidney disease, with long-term current use of insulin ([place]) 06/16/2019   CAD (coronary artery disease) 07/26/2018   Chronic diastolic CHF (congestive heart failure) ([place]) 07/26/2018   Chronic kidney disease (CKD), stage III (moderate) (HCC) 07/26/2018   Chest pain, moderate coronary artery risk 12/20/2015   DM2 (diabetes mellitus, type 2) ([place]) 12/20/2015   HTN (hypertension) 12/20/2015   HLD (hyperlipidemia) 12/20/2015   Ischemic chest pain ([place]) 12/20/2015   NSTEMI (non-ST elevated myocardial infarction) ([place])    Hypertensive heart disease without heart failure    Acute renal failure ([place])    Cholelithiasis 04/14/2011    Conditions to be addressed/monitored:  (HTN, HLD, DM II, Stage 3a Chronic Kidney disease)  Care Plan : RN Care Manager Plan of Care  Updates made by Lynne Logan, RN since 08/22/2021 12:00 AM     Problem: No plan established for management of chronic disease states (HTN, HLD, DM II, Stage 3a Chronic Kidney disease)   Priority: High     Long-Range Goal: Establish plan of care for management of chronic disease states (HTN, HLD, DM II, Stage 3a Chronic Kidney disease)   Start Date: 07/22/2021  Expected End Date: 07/22/2022  Recent Progress: On track  Priority: High  Note:   Current Barriers:  Knowledge Deficits related to plan of care for management of HTN, HLD, DM II, Stage 3a Chronic Kidney disease Chronic Disease Management support and education needs related to HTN, HLD, DM II, Stage 3a Chronic Kidney disease  RNCM Clinical Goal(s):  Patient will verbalize basic understanding of  HTN, HLD, DM II, Stage 3a Chronic Kidney disease disease process and self health management plan   demonstrate Improved health management independence   continue to work with RN Care Manager to address care management and  care coordination needs related to  HTN, HLD, DM II, Stage 3a Chronic Kidney disease will demonstrate ongoing self health care management ability    through collaboration with RN Care manager, provider, and care team.   Interventions: 1:1 collaboration with primary care provider regarding development and update of comprehensive plan of care as evidenced by provider attestation and co-signature Inter-disciplinary care team collaboration (see longitudinal plan of care) Evaluation of current treatment plan related to  self management and patient's adherence to plan as established by provider  Hypertension Interventions:  (Status:  Goal on track:  Yes.) Long Term Goal Last practice recorded BP readings:  BP Readings from Last 3 Encounters:  08/20/21 122/80  07/25/21 (!) 148/82  06/26/21 130/72  Most recent eGFR/CrCl:  Lab Results  Component Value Date   EGFR 44 (L) 06/26/2021    No components found for: CRCL Evaluation of current treatment plan related to hypertension self management and patient's adherence  to plan as established by provider Reviewed medications with patient and discussed importance of compliance Counseled on the importance of exercise goals with target of 150 minutes per week Advised patient, providing education and rationale, to monitor blood pressure daily and record, calling PCP for findings outside established parameters Provided education on prescribed diet low Sodium Discussed complications of poorly controlled blood pressure such as heart disease, stroke, circulatory complications, vision complications, kidney impairment, sexual dysfunction Assessed social determinant of health barriers Instructed on how to accurately monitor BP at home Discussed plans with patient for ongoing care management follow up and provided patient with direct contact information for care management team  Diabetes Interventions: Assessed patient's understanding of A1c goal: <7% Provided  education to patient about basic DM disease process Reviewed medications with patient and discussed importance of medication adherence Counseled on importance of regular laboratory monitoring as prescribed Discussed plans with patient for ongoing care management follow up and provided patient with direct contact information for care management team Advised patient, providing education and rationale, to check cbg daily before meals and at bedtime and record, calling PCP and or RN CM for findings outside established parameters Review of patient status, including review of consultants reports, relevant laboratory and other test results, and medications completed Mailed printed letter related to "The Dangers in Skipping Meals"; "Carb Choice List" Lab Results  Component Value Date   HGBA1C 7.2 (H) 06/26/2021     Chronic Kidney Disease Interventions:  (Status:  Goal on track:  Yes Evaluation of current treatment plan related to chronic kidney disease self management and patient's adherence to plan as established by provider      Reviewed prescribed diet increase water intake as directed Discussed plans with patient for ongoing care management follow up and provided patient with direct contact information for care management team    Provided education on kidney disease progression    Mailed printed educational material related to Eating Right with Chronic Kidney disease Last practice recorded BP readings:  BP Readings from Last 3 Encounters:  08/20/21 122/80  07/25/21 (!) 148/82  06/26/21 130/72  Most recent eGFR/CrCl:  Lab Results  Component Value Date   EGFR 44 (L) 06/26/2021    No components found for: CRCL   Patient Goals/Self-Care Activities: Take all medications as prescribed Attend all scheduled provider appointments Call pharmacy for medication refills 3-7 days in advance of running out of medications Perform all self care activities independently  Call provider office for new  concerns or questions  drink 6 to 8 glasses of water each day manage portion size take medications for blood pressure exactly as prescribed  Follow Up Plan:  Telephone follow up appointment with care management team member scheduled for:  11/20/21      Plan:Telephone follow up appointment with care management team member scheduled for:  11/20/21  Barb Merino, RN, BSN, CCM Care Management Coordinator [facility]  Direct Phone: [phone]

## 2021-08-23 NOTE — Patient Instructions (Signed)
Visit Information   Thank you for taking time to visit with me today. Please don't hesitate to contact me if I can be of assistance to you before our next scheduled telephone appointment.  Following are the goals we discussed today:  (Copy and paste patient goals from clinical care plan here)  Our next appointment is by telephone on 11/20/21 at 10:20 AM  Please call the care guide team at [phone] if you need to cancel or reschedule your appointment.   If you are experiencing a Mental Health or [place] or need someone to talk to, please call 1-800-273-TALK (toll free, 24 hour hotline)   Following is a copy of your full care plan:  Care Plan : [place] of Care  Updates made by Lynne Logan, RN since 08/22/2021 12:00 AM     Problem: No plan established for management of chronic disease states (HTN, HLD, DM II, Stage 3a Chronic Kidney disease)   Priority: High     Long-Range Goal: Establish plan of care for management of chronic disease states (HTN, HLD, DM II, Stage 3a Chronic Kidney disease)   Start Date: 07/22/2021  Expected End Date: 07/22/2022  Recent Progress: On track  Priority: High  Note:   Current Barriers:  Knowledge Deficits related to plan of care for management of HTN, HLD, DM II, Stage 3a Chronic Kidney disease Chronic Disease Management support and education needs related to HTN, HLD, DM II, Stage 3a Chronic Kidney disease  RNCM Clinical Goal(s):  Patient will verbalize basic understanding of  HTN, HLD, DM II, Stage 3a Chronic Kidney disease disease process and self health management plan   demonstrate Improved health management independence   continue to work with RN Care Manager to address care management and care coordination needs related to  HTN, HLD, DM II, Stage 3a Chronic Kidney disease will demonstrate ongoing self health care management ability    through collaboration with RN Care manager, provider, and care team.    Interventions: 1:1 collaboration with primary care provider regarding development and update of comprehensive plan of care as evidenced by provider attestation and co-signature Inter-disciplinary care team collaboration (see longitudinal plan of care) Evaluation of current treatment plan related to  self management and patient's adherence to plan as established by provider  Hypertension Interventions:  (Status:  Goal on track:  Yes.) Long Term Goal Last practice recorded BP readings:  BP Readings from Last 3 Encounters:  08/20/21 122/80  07/25/21 (!) 148/82  06/26/21 130/72  Most recent eGFR/CrCl:  Lab Results  Component Value Date   EGFR 44 (L) 06/26/2021    No components found for: CRCL Evaluation of current treatment plan related to hypertension self management and patient's adherence to plan as established by provider Reviewed medications with patient and discussed importance of compliance Counseled on the importance of exercise goals with target of 150 minutes per week Advised patient, providing education and rationale, to monitor blood pressure daily and record, calling PCP for findings outside established parameters Provided education on prescribed diet low Sodium Discussed complications of poorly controlled blood pressure such as heart disease, stroke, circulatory complications, vision complications, kidney impairment, sexual dysfunction Assessed social determinant of health barriers Instructed on how to accurately monitor BP at home Discussed plans with patient for ongoing care management follow up and provided patient with direct contact information for care management team  Diabetes Interventions: Assessed patient's understanding of A1c goal: <7% Provided education to patient about basic DM disease  process Reviewed medications with patient and discussed importance of medication adherence Counseled on importance of regular laboratory monitoring as prescribed Discussed  plans with patient for ongoing care management follow up and provided patient with direct contact information for care management team Advised patient, providing education and rationale, to check cbg daily before meals and at bedtime and record, calling PCP and or RN CM for findings outside established parameters Review of patient status, including review of consultants reports, relevant laboratory and other test results, and medications completed Mailed printed letter related to "The Dangers in Skipping Meals"; "Carb Choice List" Lab Results  Component Value Date   HGBA1C 7.2 (H) 06/26/2021     Chronic Kidney Disease Interventions:  (Status:  Goal on track:  Yes Evaluation of current treatment plan related to chronic kidney disease self management and patient's adherence to plan as established by provider      Reviewed prescribed diet increase water intake as directed Discussed plans with patient for ongoing care management follow up and provided patient with direct contact information for care management team    Provided education on kidney disease progression    Mailed printed educational material related to Eating Right with Chronic Kidney disease Last practice recorded BP readings:  BP Readings from Last 3 Encounters:  08/20/21 122/80  07/25/21 (!) 148/82  06/26/21 130/72  Most recent eGFR/CrCl:  Lab Results  Component Value Date   EGFR 44 (L) 06/26/2021    No components found for: CRCL   Patient Goals/Self-Care Activities: Take all medications as prescribed Attend all scheduled provider appointments Call pharmacy for medication refills 3-7 days in advance of running out of medications Perform all self care activities independently  Call provider office for new concerns or questions  drink 6 to 8 glasses of water each day manage portion size take medications for blood pressure exactly as prescribed  Follow Up Plan:  Telephone follow up appointment with care management team  member scheduled for:  11/20/21      Consent to CCM Services: Sarah Weaver was given information about Chronic Care Management services including:  CCM service includes personalized support from designated clinical staff supervised by her physician, including individualized plan of care and coordination with other care providers 24/7 contact phone numbers for assistance for urgent and routine care needs. Service will only be billed when office clinical staff spend 20 minutes or more in a month to coordinate care. Only one practitioner may furnish and bill the service in a calendar month. The patient may stop CCM services at any time (effective at the end of the month) by phone call to the office staff. The patient will be responsible for cost sharing (co-pay) of up to 20% of the service fee (after annual deductible is met).  Patient agreed to services and verbal consent obtained.   Patient verbalizes understanding of instructions provided today and agrees to view in [place].   Telephone follow up appointment with care management team member scheduled for: 11/20/21

## 2021-09-12 ENCOUNTER — Other Ambulatory Visit: Payer: Self-pay | Admitting: Nurse Practitioner

## 2021-09-13 ENCOUNTER — Telehealth: Payer: Medicare Other

## 2021-09-13 ENCOUNTER — Ambulatory Visit (INDEPENDENT_AMBULATORY_CARE_PROVIDER_SITE_OTHER): Payer: Medicare Other

## 2021-09-13 DIAGNOSIS — I10 Essential (primary) hypertension: Secondary | ICD-10-CM

## 2021-09-13 DIAGNOSIS — N1831 Chronic kidney disease, stage 3a: Secondary | ICD-10-CM

## 2021-09-13 DIAGNOSIS — E782 Mixed hyperlipidemia: Secondary | ICD-10-CM

## 2021-09-13 DIAGNOSIS — N1832 Chronic kidney disease, stage 3b: Secondary | ICD-10-CM

## 2021-09-13 NOTE — Patient Instructions (Signed)
Visit Information  Thank you for taking time to visit with me today. Please don't hesitate to contact me if I can be of assistance to you before our next scheduled telephone appointment.  Following are the goals we discussed today:  (Copy and paste patient goals from clinical care plan here)  Our next appointment is by telephone on 10/17/21 at 1:25 PM   Please call the care guide team at [phone] if you need to cancel or reschedule your appointment.   If you are experiencing a Mental Health or Behavioral Health Crisis or need someone to talk to, please call 1-800-273-TALK (toll free, 24 hour hotline)   Patient verbalizes understanding of instructions provided today and agrees to view in MyChart.    Delsa Sale, RN, BSN, CCM Care Management Coordinator [facility] Care Management/[facility]  Direct Phone: [phone]

## 2021-09-13 NOTE — Chronic Care Management (AMB) (Signed)
Chronic Care Management   CCM RN Visit Note  09/13/2021 Name: Sarah Weaver MRN: [id number] DOB: Jun 20, 1936  Subjective: Sarah Weaver is a 85 y.o. year old female who is a primary care patient of [name], [place]. The care management team was consulted for assistance with disease management and care coordination needs.    Engaged with patient by telephone for follow up visit in response to provider referral for case management and/or care coordination services.   Consent to Services:  The patient was given information about Chronic Care Management services, agreed to services, and gave verbal consent prior to initiation of services.  Please see initial visit note for detailed documentation.   Patient agreed to services and verbal consent obtained.   Assessment: Review of patient past medical history, allergies, medications, health status, including review of consultants reports, laboratory and other test data, was performed as part of comprehensive evaluation and provision of chronic care management services.   SDOH (Social Determinants of Health) assessments and interventions performed:  Yes, no acute challenges   CCM Care Plan  Allergies  Allergen Reactions   Carvedilol Cough    Pt reports causes her to cough   Lisinopril Cough    REPORTS CAUSES DRY COUGH    Outpatient Encounter Medications as of 09/13/2021  Medication Sig Note   Accu-Chek Softclix Lancets lancets USE UP TO 4 TIMES DAILY AS DIRECTED    albuterol (VENTOLIN HFA) 108 (90 Base) MCG/ACT inhaler Inhale 2 puffs into the lungs every 6 (six) hours as needed for wheezing or shortness of breath.    amLODipine (NORVASC) 5 MG tablet Take 1 tablet (5 mg total) by mouth daily. (Patient not taking: Reported on 07/23/2021) 07/23/2021: Nephrologist is holding Amlodipine   aspirin EC 81 MG tablet Take 81 mg by mouth daily.    atorvastatin (LIPITOR) 80 MG tablet Take 1 tablet by mouth at bedtime     benzonatate (TESSALON PERLES) 100 MG capsule Take 1 capsule (100 mg total) by mouth 3 (three) times daily as needed for cough.    blood glucose meter kit and supplies KIT Dispense based on patient and insurance preference. Use up to four times daily as directed. (FOR ICD-9 250.00, 250.01).    brinzolamide (AZOPT) 1 % ophthalmic suspension Place 1 drop into both eyes 3 (three) times daily.    Cholecalciferol 125 MCG (5000 UT) TABS Take 1 tablet by mouth daily.    empagliflozin (JARDIANCE) 10 MG TABS tablet Take 1 tablet (10 mg total) by mouth daily before breakfast.    ezetimibe (ZETIA) 10 MG tablet TAKE 1 TABLET BY MOUTH EVERY DAY    furosemide (LASIX) 40 MG tablet Take 1 tablet (40 mg total) by mouth daily.    insulin degludec (TRESIBA FLEXTOUCH) 100 UNIT/ML FlexTouch Pen Inject 15 Units into the skin daily.    Insulin Pen Needle (BD PEN NEEDLE NANO U/F) 32G X 4 MM MISC USE NIGHTLY AS DIRECTED    Latanoprostene Bunod 0.024 % SOLN Apply 1 drop to eye daily.    levothyroxine (SYNTHROID) 50 MCG tablet TAKE 1 TABLET BY MOUTH EVERY DAY BEFORE BREAKFAST    LINZESS 145 MCG CAPS capsule TAKE 1 CAPSULE (145 MCG TOTAL) BY MOUTH DAILY BEFORE BREAKFAST. FOR CONSTIPATION    losartan (COZAAR) 50 MG tablet Take 50 mg by mouth at bedtime.    Magnesium 250 MG TABS Take 1 tablet (250 mg total) by mouth every evening. Take with evening meal    nebivolol (BYSTOLIC) 2.5 MG  tablet Take 1 tablet (2.5 mg total) by mouth daily.    nystatin (NYSTATIN) powder Apply 1 application topically 3 (three) times daily.    ONETOUCH VERIO test strip CHECK BLOOD SUGARS ONCE DAILY    OZEMPIC, 0.25 OR 0.5 MG/DOSE, 2 MG/1.5ML SOPN INJECT 0.25 MG INTO THE SKIN ONCE A WEEK.    traMADol-acetaminophen (ULTRACET) 37.5-325 MG tablet TAKE 1 TABLET BY MOUTH EVERY 6 HOURS AS NEEDED FOR MODERATE BACK PAIN    No facility-administered encounter medications on file as of 09/13/2021.    Patient Active Problem List   Diagnosis Date Noted   Class  3 severe obesity due to excess calories with serious comorbidity in adult [facility]) 08/25/2019   Diabetes mellitus due to underlying condition with stage 3a chronic kidney disease, with long-term current use of insulin ([place]) 06/16/2019   CAD (coronary artery disease) 07/26/2018   Chronic diastolic CHF (congestive heart failure) ([place]) 07/26/2018   Chronic kidney disease (CKD), stage III (moderate) ([facility]) 07/26/2018   Chest pain, moderate coronary artery risk 12/20/2015   DM2 (diabetes mellitus, type 2) ([place]) 12/20/2015   HTN (hypertension) 12/20/2015   HLD (hyperlipidemia) 12/20/2015   Ischemic chest pain ([place]) 12/20/2015   NSTEMI (non-ST elevated myocardial infarction) ([place])    Hypertensive heart disease without heart failure    Acute renal failure ([place])    Cholelithiasis 04/14/2011    Conditions to be addressed/monitored: HTN, HLD, DM II, Stage 3a Chronic Kidney disease  Care Plan : RN Care Manager Plan of Care  Updates made by Lynne Logan, RN since 09/13/2021 12:00 AM     Problem: No plan established for management of chronic disease states (HTN, HLD, DM II, Stage 3a Chronic Kidney disease)   Priority: High     Long-Range Goal: Establish plan of care for management of chronic disease states (HTN, HLD, DM II, Stage 3a Chronic Kidney disease)   Start Date: 07/22/2021  Expected End Date: 07/22/2022  Recent Progress: On track  Priority: High  Note:   Current Barriers:  Knowledge Deficits related to plan of care for management of HTN, HLD, DM II, Stage 3a Chronic Kidney disease Chronic Disease Management support and education needs related to HTN, HLD, DM II, Stage 3a Chronic Kidney disease  RNCM Clinical Goal(s):  Patient will verbalize basic understanding of  HTN, HLD, DM II, Stage 3a Chronic Kidney disease disease process and self health management plan   demonstrate Improved health management independence   continue to work with RN Care Manager to address care management and care  coordination needs related to  HTN, HLD, DM II, Stage 3a Chronic Kidney disease will demonstrate ongoing self health care management ability    through collaboration with RN Care manager, provider, and care team.   Interventions: 1:1 collaboration with primary care provider regarding development and update of comprehensive plan of care as evidenced by provider attestation and co-signature Inter-disciplinary care team collaboration (see longitudinal plan of care) Evaluation of current treatment plan related to  self management and patient's adherence to plan as established by provider  Hypertension Interventions:  (Status:  Condition stable.  Not addressed this visit.) Long Term Goal Last practice recorded BP readings:  BP Readings from Last 3 Encounters:  08/20/21 122/80  07/25/21 (!) 148/82  06/26/21 130/72  Most recent eGFR/CrCl:  Lab Results  Component Value Date   EGFR 44 (L) 06/26/2021    No components found for: CRCL Evaluation of current treatment plan related to hypertension self management and patient's  adherence to plan as established by provider Reviewed medications with patient and discussed importance of compliance Counseled on the importance of exercise goals with target of 150 minutes per week Advised patient, providing education and rationale, to monitor blood pressure daily and record, calling PCP for findings outside established parameters Provided education on prescribed diet low Sodium Discussed complications of poorly controlled blood pressure such as heart disease, stroke, circulatory complications, vision complications, kidney impairment, sexual dysfunction Assessed social determinant of health barriers Instructed on how to accurately monitor BP at home Discussed plans with patient for ongoing care management follow up and provided patient with direct contact information for care management team  Diabetes Interventions: (Status:  Condition stable.  Not addressed  this visit.)  Assessed patient's understanding of A1c goal: <7% Provided education to patient about basic DM disease process Reviewed medications with patient and discussed importance of medication adherence Counseled on importance of regular laboratory monitoring as prescribed Discussed plans with patient for ongoing care management follow up and provided patient with direct contact information for care management team Advised patient, providing education and rationale, to check cbg daily before meals and at bedtime and record, calling PCP and or RN CM for findings outside established parameters Review of patient status, including review of consultants reports, relevant laboratory and other test results, and medications completed Mailed printed letter related to "The Dangers in Skipping Meals"; "Carb Choice List" Lab Results  Component Value Date   HGBA1C 7.2 (H) 06/26/2021     Chronic Kidney Disease Interventions:  (Status:  Condition stable.  Not addressed this visit. Evaluation of current treatment plan related to chronic kidney disease self management and patient's adherence to plan as established by provider      Reviewed prescribed diet increase water intake as directed Discussed plans with patient for ongoing care management follow up and provided patient with direct contact information for care management team    Provided education on kidney disease progression    Mailed printed educational material related to Eating Right with Chronic Kidney disease Last practice recorded BP readings:  BP Readings from Last 3 Encounters:  08/20/21 122/80  07/25/21 (!) 148/82  06/26/21 130/72  Most recent eGFR/CrCl:  Lab Results  Component Value Date   EGFR 44 (L) 06/26/2021    No components found for: CRCL    High Risk for Neuropathy: (Status:  New goal.)  Long Term Goal Return phone call to patient to discuss symptoms related to abnormal sensation to her legs  Evaluation of current  treatment plan related to  abnormal sensation to bilateral legs , self-management and patient's adherence to plan as established by provider Determined patient is experiencing a "crawling sensation" to both upper legs from her knees to her inner thighs, patient denies pain or cramping, PCP was notified during last PCP visit  Reviewed and discussed next scheduled appointment with Cardiology is scheduled for 10/15/21 @8 :25 AM with Dr. Kathlen Mody Discussed patient plans to discuss these symptoms with Cardiology during this visit, she denies having additional questions at this time  Discussed plans with patient for ongoing care management follow up and provided patient with direct contact information for care management team   Patient Goals/Self-Care Activities: Take all medications as prescribed Attend all scheduled provider appointments Call pharmacy for medication refills 3-7 days in advance of running out of medications Perform all self care activities independently  Call provider office for new concerns or questions  drink 6 to 8 glasses of water each day manage portion  size take medications for blood pressure exactly as prescribed  Follow Up Plan:  Telephone follow up appointment with care management team member scheduled for:  10/17/21      Plan:Telephone follow up appointment with care management team member scheduled for:  10/17/21  Barb Merino, RN, BSN, CCM Care Management Coordinator [place] [facility]  Direct Phone: [phone]

## 2021-09-14 DIAGNOSIS — N1831 Chronic kidney disease, stage 3a: Secondary | ICD-10-CM | POA: Diagnosis not present

## 2021-09-14 DIAGNOSIS — N1832 Chronic kidney disease, stage 3b: Secondary | ICD-10-CM

## 2021-09-14 DIAGNOSIS — E1122 Type 2 diabetes mellitus with diabetic chronic kidney disease: Secondary | ICD-10-CM

## 2021-09-14 DIAGNOSIS — E782 Mixed hyperlipidemia: Secondary | ICD-10-CM | POA: Diagnosis not present

## 2021-09-14 DIAGNOSIS — I10 Essential (primary) hypertension: Secondary | ICD-10-CM

## 2021-09-24 ENCOUNTER — Other Ambulatory Visit: Payer: Self-pay | Admitting: Nurse Practitioner

## 2021-10-01 ENCOUNTER — Telehealth: Payer: Self-pay

## 2021-10-01 NOTE — Chronic Care Management (AMB) (Signed)
Chronic Care Management Pharmacy Assistant   Name: Sarah Weaver  MRN: [id number] DOB: Apr 09, 1936  Reason for Encounter: Disease State/ Hypertension  Recent office visits:  09-13-2021 Lynne Logan, RN (CCM)  08-22-2021 Lynne Logan, RN (CCM)  08-20-2021 [name], [place].  Recent consult visits:  None  Hospital visits:  None in previous 6 months  Medications: Outpatient Encounter Medications as of 10/01/2021  Medication Sig   Accu-Chek Softclix Lancets lancets USE UP TO 4 TIMES DAILY AS DIRECTED   albuterol (VENTOLIN HFA) 108 (90 Base) MCG/ACT inhaler Inhale 2 puffs into the lungs every 6 (six) hours as needed for wheezing or shortness of breath.   amLODipine (NORVASC) 5 MG tablet TAKE 1 TABLET (5 MG TOTAL) BY MOUTH DAILY.   aspirin EC 81 MG tablet Take 81 mg by mouth daily.   atorvastatin (LIPITOR) 80 MG tablet Take 1 tablet by mouth at bedtime   benzonatate (TESSALON PERLES) 100 MG capsule Take 1 capsule (100 mg total) by mouth 3 (three) times daily as needed for cough.   blood glucose meter kit and supplies KIT Dispense based on patient and insurance preference. Use up to four times daily as directed. (FOR ICD-9 250.00, 250.01).   brinzolamide (AZOPT) 1 % ophthalmic suspension Place 1 drop into both eyes 3 (three) times daily.   Cholecalciferol 125 MCG (5000 UT) TABS Take 1 tablet by mouth daily.   empagliflozin (JARDIANCE) 10 MG TABS tablet Take 1 tablet (10 mg total) by mouth daily before breakfast.   ezetimibe (ZETIA) 10 MG tablet TAKE 1 TABLET BY MOUTH EVERY DAY   furosemide (LASIX) 40 MG tablet Take 1 tablet (40 mg total) by mouth daily.   insulin degludec (TRESIBA FLEXTOUCH) 100 UNIT/ML FlexTouch Pen Inject 15 Units into the skin daily.   Insulin Pen Needle (BD PEN NEEDLE NANO U/F) 32G X 4 MM MISC USE NIGHTLY AS DIRECTED   Latanoprostene Bunod 0.024 % SOLN Apply 1 drop to eye daily.   levothyroxine (SYNTHROID) 50 MCG tablet TAKE 1 TABLET BY MOUTH  EVERY DAY BEFORE BREAKFAST   LINZESS 145 MCG CAPS capsule TAKE 1 CAPSULE (145 MCG TOTAL) BY MOUTH DAILY BEFORE BREAKFAST. FOR CONSTIPATION   losartan (COZAAR) 50 MG tablet Take 50 mg by mouth at bedtime.   Magnesium 250 MG TABS Take 1 tablet (250 mg total) by mouth every evening. Take with evening meal   nebivolol (BYSTOLIC) 2.5 MG tablet Take 1 tablet (2.5 mg total) by mouth daily.   nystatin (NYSTATIN) powder Apply 1 application topically 3 (three) times daily.   ONETOUCH VERIO test strip CHECK BLOOD SUGARS ONCE DAILY   OZEMPIC, 0.25 OR 0.5 MG/DOSE, 2 MG/1.5ML SOPN INJECT 0.25 MG INTO THE SKIN ONCE A WEEK.   traMADol-acetaminophen (ULTRACET) 37.5-325 MG tablet TAKE 1 TABLET BY MOUTH EVERY 6 HOURS AS NEEDED FOR MODERATE BACK PAIN   No facility-administered encounter medications on file as of 10/01/2021.  Reviewed chart prior to disease state call. Spoke with patient regarding BP  Recent Office Vitals: BP Readings from Last 3 Encounters:  08/20/21 122/80  07/25/21 (!) 148/82  06/26/21 130/72   Pulse Readings from Last 3 Encounters:  08/20/21 72  07/25/21 81  06/26/21 82    Wt Readings from Last 3 Encounters:  08/20/21 298 lb 6.4 oz (135.4 kg)  07/25/21 (!) 303 lb 3.2 oz (137.5 kg)  06/26/21 (!) 308 lb (139.7 kg)     Kidney Function Lab Results  Component Value Date/Time  CREATININE 1.23 (H) 06/26/2021 05:06 PM   CREATININE 1.60 (H) 11/06/2020 12:02 PM   CREATININE 1.41 (H) 01/18/2016 08:22 AM   CREATININE 2.62 (H) 12/28/2015 10:27 AM   GFRNONAA 30 (L) 11/06/2020 12:02 PM   GFRAA 34 (L) 11/06/2020 12:02 PM    BMP Latest Ref Rng & Units 06/26/2021 11/06/2020 07/11/2020  Glucose 70 - 99 mg/dL 135(H) 146(H) 188(H)  BUN 8 - 27 mg/dL _0 Creatinine 0.57 - 1.00 mg/dL 1.23(H) 1.60(H) 1.31(H)  BUN/Creat Ratio 12 - _1 Sodium 134 - 144 mmol/L 140 141 144  Potassium 3.5 - 5.2 mmol/L 4.4 4.4 4.1  Chloride 96 - 106 mmol/L 101 100 101  CO2 20 - 29 mmol/L _2 Calcium 8.7 - 10.3 mg/dL 9.7 9.6 10.0    Current antihypertensive regimen:  Losartan 50 mg nightly Bystolic 2.5 mg daily Furosemide 40 mg daily  How often are you checking your Blood Pressure? weekly  Current home BP readings: 134/71  What recent interventions/DTPs have been made by any provider to improve Blood Pressure control since last CPP Visit:  Educated on Importance of home blood pressure monitoring; Proper BP monitoring technique; Symptoms of hypotension and importance of maintaining adequate hydration; -Patient reports that she gets nervous and afraid of her BP readings.  -Counseled to monitor BP at home monitor at least four times per week , document, and provide log at future appointments  Any recent hospitalizations or ED visits since last visit with CPP? No  What diet changes have been made to improve Blood Pressure Control?  Patient states she doesn't add salt to food, drinks 5 bottles of water daily and eats plenty of vegetables.  What exercise is being done to improve your Blood Pressure Control?  Patient states she walks around her house some.  Adherence Review: Is the patient currently on ACE/ARB medication? Yes Does the patient have >5 day gap between last estimated fill dates? No  Care Gaps: Shingrix overdue Covid booster overdue AWV 08-06-2022  Star Rating Drugs: Ozempic 0.25 mg- Last filled 08-19-2021 [facility] Losartan 50 mg- Last filled 09-09-2021 [facility] Jardiance 10 mg- Last filled 08-14-2021 [facility] Atorvastatin 80 mg- Last filled 08-01-2021 [facility]  [place] Clinical Pharmacist Assistant [phone]

## 2021-10-03 ENCOUNTER — Other Ambulatory Visit: Payer: Self-pay | Admitting: Nurse Practitioner

## 2021-10-03 DIAGNOSIS — E119 Type 2 diabetes mellitus without complications: Secondary | ICD-10-CM

## 2021-10-14 ENCOUNTER — Encounter: Payer: Self-pay | Admitting: Physician Assistant

## 2021-10-14 DIAGNOSIS — R002 Palpitations: Secondary | ICD-10-CM | POA: Insufficient documentation

## 2021-10-14 HISTORY — DX: Palpitations: R00.2

## 2021-10-14 NOTE — Progress Notes (Signed)
Cardiology Office Note:    Date:  10/15/2021   ID:  Sarah Weaver, DOB 06-Oct-1935, MRN [id number]  PCP:  [name], [place] Prov[facility] Cardiologist:  Freada Bergeron, MD Cardiology APP:  Sharmon Revere     Referring MD: [name], FNP   Chief Complaint:  F/u for CAD, HTN, CHF    Patient Profile: Coronary artery disease NSTEMI:  S/p DES to the LAD 4/17 (HFpEF) heart failure with preserved ejection fraction  Palpitations Monitor 5/21: PACs/PVCs; intermittent Wenckebach during sleep  Hypertension Diabetes mellitus type 2 Hyperlipidemia Glaucoma Morbid obesity Chronic kidney disease, stage III History of hyperkalemia   Prior CV Studies: Zio Patch Monitor 5/21 NSR, avg HR 67, 1st degree AV block, intermittent Wenckebach during sleep, rare PACs/PVCs  Echocardiogram 01/12/2020 EF 60-65, no RWMA, mild LVH, Gr 1 DD, trivial MR, AoV sclerosis (no AS)   Echocardiogram 12/21/2015 Mild LVH, EF 55-60, normal wall motion, GR 1 DD, MAC   Cardiac catheterization 12/20/2015 LAD ostial 25, mid 99; D2 ostial 40 LCx proximal 40 PCI: 2.5 x 20 mm Synergy DES to the mid LAD   History of Present Illness:   Sarah Weaver is a 86 y.o. female with the above problem list.  She was last seen by Dr. Johney Frame in 5/22.  Her Nebivolol was increased to 5 mg for better BP control.  But, she could not tolerate this dose due to dizziness and near syncope.  Her dose was reduced back to 2.5 mg once daily.  She returns for f/u.  She is here alone.  Overall, she has been doing well.  She has not had chest pain, syncope, orthopnea.  She sleeps on 2 pillows chronically.  Her nephrologist took her off of amlodipine several months ago.  Her lower extremity edema has improved since then.  She often feels that she has something crawling on her legs.  She notes that she has felt better since she was taken off of amlodipine.        Past Medical History:  Diagnosis  Date   Arthritis    "all over"   Chronic lower back pain    CKD Stage III 07/26/2018   Hx of hyperkalemia   Coronary artery disease    a. status post DES to the LAD 12/2015.   Glaucoma    HFpEF    Echocardiogram 12/2019: EF 60-65, no RWMA, mild LVH, Gr 1 DD, trivial MR, mild AoV sclerosis, no AS   Hyperkalemia    Hyperlipidemia    Hypertension    Mobitz (type) I (Wenckebach's) atrioventricular block 10/15/2021   Palpitations Zio Patch Monitor 5/21 NSR, avg HR 67, 1st degree AV block, intermittent Wenckebach during sleep, rare PACs/PVCs   Morbid obesity (HCC)    Palpitations    Type II diabetes mellitus (HCC)    Current Medications: Current Meds  Medication Sig   Accu-Chek Softclix Lancets lancets USE UP TO 4 TIMES DAILY AS DIRECTED   albuterol (VENTOLIN HFA) 108 (90 Base) MCG/ACT inhaler Inhale 2 puffs into the lungs every 6 (six) hours as needed for wheezing or shortness of breath.   aspirin EC 81 MG tablet Take 81 mg by mouth daily.   atorvastatin (LIPITOR) 80 MG tablet Take 1 tablet by mouth at bedtime   benzonatate (TESSALON PERLES) 100 MG capsule Take 1 capsule (100 mg total) by mouth 3 (three) times daily as needed for cough.   blood glucose meter kit and supplies KIT Dispense based  on patient and insurance preference. Use up to four times daily as directed. (FOR ICD-9 250.00, 250.01).   brinzolamide (AZOPT) 1 % ophthalmic suspension Place 1 drop into both eyes 3 (three) times daily.   Cholecalciferol 125 MCG (5000 UT) TABS Take 1 tablet by mouth daily.   empagliflozin (JARDIANCE) 10 MG TABS tablet Take 1 tablet (10 mg total) by mouth daily before breakfast.   ezetimibe (ZETIA) 10 MG tablet TAKE 1 TABLET BY MOUTH EVERY DAY   furosemide (LASIX) 40 MG tablet Take 1 tablet (40 mg total) by mouth daily.   Insulin Pen Needle (BD PEN NEEDLE NANO U/F) 32G X 4 MM MISC USE NIGHTLY AS DIRECTED   Latanoprostene Bunod 0.024 % SOLN Apply 1 drop to eye daily.   levothyroxine (SYNTHROID) 50  MCG tablet TAKE 1 TABLET BY MOUTH EVERY DAY BEFORE BREAKFAST   LINZESS 145 MCG CAPS capsule TAKE 1 CAPSULE (145 MCG TOTAL) BY MOUTH DAILY BEFORE BREAKFAST. FOR CONSTIPATION   losartan (COZAAR) 50 MG tablet Take 50 mg by mouth at bedtime.   Magnesium 250 MG TABS Take 1 tablet (250 mg total) by mouth every evening. Take with evening meal   nebivolol (BYSTOLIC) 2.5 MG tablet Take 1 tablet (2.5 mg total) by mouth daily.   nystatin (NYSTATIN) powder Apply 1 application topically 3 (three) times daily.   ONETOUCH VERIO test strip CHECK BLOOD SUGARS ONCE DAILY   OZEMPIC, 0.25 OR 0.5 MG/DOSE, 2 MG/1.5ML SOPN INJECT 0.25 MG INTO THE SKIN ONCE A WEEK.   traMADol-acetaminophen (ULTRACET) 37.5-325 MG tablet TAKE 1 TABLET BY MOUTH EVERY 6 HOURS AS NEEDED FOR MODERATE BACK PAIN   TRESIBA FLEXTOUCH 100 UNIT/ML FlexTouch Pen INJECT 10 UNITS INTO THE SKIN DAILY.    Allergies:   Carvedilol and Lisinopril   Social History   Tobacco Use   Smoking status: Never   Smokeless tobacco: Never  Vaping Use   Vaping Use: Never used  Substance Use Topics   Alcohol use: No   Drug use: No    Family Hx: The patient's family history includes Cancer in her sister; Heart attack in her mother; Heart disease in her mother and sister; Hypertension in her mother; Stroke in her sister.  Review of Systems  Gastrointestinal:  Negative for hematochezia.  Genitourinary:  Negative for hematuria.    EKGs/Labs/Other Test Reviewed:    EKG:  EKG is not ordered today.  The ekg ordered today demonstrates n/a  Recent Labs: 03/19/2021: TSH 1.770 06/26/2021: ALT 34; BNP 62.4; BUN 19; Creatinine, Ser 1.23; Potassium 4.4; Sodium 140   Recent Lipid Panel Recent Labs    06/26/21 1706  CHOL 121  TRIG 69  HDL 57  LDLCALC 50     Risk Assessment/Calculations:         Physical Exam:    VS:  BP 140/80 (BP Location: Left Arm)    Pulse 88    Ht _0  (1.626 m)    Wt 297 lb 9.6 oz (135 kg)    SpO2 98%    BMI 51.08 kg/m     Wt  Readings from Last 3 Encounters:  10/15/21 297 lb 9.6 oz (135 kg)  08/20/21 298 lb 6.4 oz (135.4 kg)  07/25/21 (!) 303 lb 3.2 oz (137.5 kg)    Constitutional:      Appearance: Healthy appearance. Not in distress.  Pulmonary:     Effort: Pulmonary effort is normal.     Breath sounds: No wheezing. No rales.  Cardiovascular:  Normal rate. Regular rhythm. Normal S1. Normal S2.      Murmurs: There is no murmur.  Edema:    Peripheral edema present.    Pretibial: bilateral trace edema of the pretibial area. Abdominal:     Palpations: Abdomen is soft.  Musculoskeletal:     Cervical back: Neck supple. Skin:    General: Skin is warm and dry.  Neurological:     Mental Status: Alert and oriented to person, place and time.     Cranial Nerves: Cranial nerves are intact.         ASSESSMENT & PLAN:   CAD (coronary artery disease) History of non-STEMI in 2017 treated with a DES to the LAD.  She is doing well without anginal symptoms.  Continue aspirin 81 mg daily, atorvastatin 80 mg daily, nebivolol 2.5 mg daily.  (HFpEF) heart failure with preserved ejection fraction (HCC) Overall, volume status is stable.  She is NYHA IIb-III.  She ambulates with a walker.  Continue empagliflozin 10 mg daily, furosemide 40 mg daily.  She has a history of hyperkalemia and is therefore not a candidate for MRA.  HTN (hypertension) Her blood pressure is 128/70 on recheck by me.  She typically notes that her blood pressure goes up when she comes in the office.  I will not adjust her medications any further.  Continue losartan 50 mg daily, nebivolol 2.5 mg daily.  HLD (hyperlipidemia) LDL optimal by labs in October.  Continue atorvastatin 80 mg daily.  Chronic kidney disease (CKD), stage III (moderate) (HCC) She is followed chronically by Dr. Joelyn Oms with nephrology.  She does note some symptoms that sound like restless leg syndrome.  Her iron levels may be low.  Obtain follow-up BMET, CBC today.  If her  hemoglobin is low, follow-up with iron studies.          Dispo:  Return in about 6 months (around 04/14/2022) for Routine Follow Up, w/ Dr. Johney Frame, or Richardson Dopp, PA-C.   Medication Adjustments/Labs and Tests Ordered: Current medicines are reviewed at length with the patient today.  Concerns regarding medicines are outlined above.  Tests Ordered: Orders Placed This Encounter  Procedures   Basic metabolic panel   CBC   Medication Changes: No orders of the defined types were placed in this encounter.  Signed, Richardson Dopp, PA-C  10/15/2021 9:20 AM    [facility] [place], [place], [place]  [address] Phone: [phone]; Fax: [phone]

## 2021-10-15 ENCOUNTER — Encounter: Payer: Self-pay | Admitting: Physician Assistant

## 2021-10-15 ENCOUNTER — Ambulatory Visit (INDEPENDENT_AMBULATORY_CARE_PROVIDER_SITE_OTHER): Payer: Medicare Other | Admitting: Physician Assistant

## 2021-10-15 ENCOUNTER — Other Ambulatory Visit: Payer: Self-pay

## 2021-10-15 VITALS — BP 140/80 | HR 88 | Ht 64.0 in | Wt 297.6 lb

## 2021-10-15 DIAGNOSIS — E7800 Pure hypercholesterolemia, unspecified: Secondary | ICD-10-CM

## 2021-10-15 DIAGNOSIS — I10 Essential (primary) hypertension: Secondary | ICD-10-CM | POA: Diagnosis not present

## 2021-10-15 DIAGNOSIS — I2510 Atherosclerotic heart disease of native coronary artery without angina pectoris: Secondary | ICD-10-CM | POA: Diagnosis not present

## 2021-10-15 DIAGNOSIS — R002 Palpitations: Secondary | ICD-10-CM

## 2021-10-15 DIAGNOSIS — I5032 Chronic diastolic (congestive) heart failure: Secondary | ICD-10-CM

## 2021-10-15 DIAGNOSIS — I441 Atrioventricular block, second degree: Secondary | ICD-10-CM

## 2021-10-15 DIAGNOSIS — N1831 Chronic kidney disease, stage 3a: Secondary | ICD-10-CM

## 2021-10-15 HISTORY — DX: Atrioventricular block, second degree: I44.1

## 2021-10-15 LAB — CBC
Hematocrit: 38.2 % (ref 34.0–46.6)
Hemoglobin: 12.7 g/dL (ref 11.1–15.9)
MCH: 28.7 pg (ref 26.6–33.0)
MCHC: 33.2 g/dL (ref 31.5–35.7)
MCV: 86 fL (ref 79–97)
Platelets: 197 10*3/uL (ref 150–450)
RBC: 4.42 x10E6/uL (ref 3.77–5.28)
RDW: 13.1 % (ref 11.7–15.4)
WBC: 3.8 10*3/uL (ref 3.4–10.8)

## 2021-10-15 LAB — BASIC METABOLIC PANEL
BUN/Creatinine Ratio: 18 (ref 12–28)
BUN: 26 mg/dL (ref 8–27)
CO2: 27 mmol/L (ref 20–29)
Calcium: 9.8 mg/dL (ref 8.7–10.3)
Chloride: 101 mmol/L (ref 96–106)
Creatinine, Ser: 1.44 mg/dL — ABNORMAL HIGH (ref 0.57–1.00)
Glucose: 193 mg/dL — ABNORMAL HIGH (ref 70–99)
Potassium: 3.9 mmol/L (ref 3.5–5.2)
Sodium: 137 mmol/L (ref 134–144)
eGFR: 36 mL/min/{1.73_m2} — ABNORMAL LOW (ref >59)

## 2021-10-15 NOTE — Assessment & Plan Note (Signed)
History of non-STEMI in 2017 treated with a DES to the LAD.  She is doing well without anginal symptoms.  Continue aspirin 81 mg daily, atorvastatin 80 mg daily, nebivolol 2.5 mg daily.

## 2021-10-15 NOTE — Assessment & Plan Note (Signed)
LDL optimal by labs in October.  Continue atorvastatin 80 mg daily.

## 2021-10-15 NOTE — Patient Instructions (Signed)
Medication Instructions:   Your physician recommends that you continue on your current medications as directed. Please refer to the Current Medication list given to you today.  *If you need a refill on your cardiac medications before your next appointment, please call your pharmacy*   Lab Work:  TODAY!!!! BMET/CBC  If you have labs (blood work) drawn today and your tests are completely normal, you will receive your results only by: MyChart Message (if you have MyChart) OR A paper copy in the mail If you have any lab test that is abnormal or we need to change your treatment, we will call you to review the results.   Testing/Procedures:  None Ordered.   Follow-Up: At [facility], you and your health needs are our priority.  As part of our continuing mission to provide you with exceptional heart care, we have created designated Provider Care Teams.  These Care Teams include your primary Cardiologist (physician) and Advanced Practice Providers (APPs -  Physician Assistants and Nurse Practitioners) who all work together to provide you with the care you need, when you need it.  We recommend signing up for the patient portal called "MyChart".  Sign up information is provided on this After Visit Summary.  MyChart is used to connect with patients for Virtual Visits (Telemedicine).  Patients are able to view lab/test results, encounter notes, upcoming appointments, etc.  Non-urgent messages can be sent to your provider as well.   To learn more about what you can do with MyChart, go to [email or web address].    Your next appointment:   6 month(s)  The format for your next appointment:   In Person  Provider:   Meriam Sprague, MD     Other Instructions  Your physician wants you to follow-up in: 6 months with Dr.Pemberton.  You will receive a reminder letter in the mail two months in advance. If you don't receive a letter, please call our office to schedule the follow-up  appointment.

## 2021-10-15 NOTE — Assessment & Plan Note (Signed)
She is followed chronically by Dr. Marisue Humble with nephrology.  She does note some symptoms that sound like restless leg syndrome.  Her iron levels may be low.  Obtain follow-up BMET, CBC today.  If her hemoglobin is low, follow-up with iron studies.

## 2021-10-15 NOTE — Assessment & Plan Note (Signed)
Overall, volume status is stable.  She is NYHA IIb-III.  She ambulates with a walker.  Continue empagliflozin 10 mg daily, furosemide 40 mg daily.  She has a history of hyperkalemia and is therefore not a candidate for MRA.

## 2021-10-15 NOTE — Assessment & Plan Note (Signed)
Her blood pressure is 128/70 on recheck by me.  She typically notes that her blood pressure goes up when she comes in the office.  I will not adjust her medications any further.  Continue losartan 50 mg daily, nebivolol 2.5 mg daily.

## 2021-10-17 ENCOUNTER — Telehealth: Payer: Medicare Other

## 2021-10-17 ENCOUNTER — Ambulatory Visit (INDEPENDENT_AMBULATORY_CARE_PROVIDER_SITE_OTHER): Payer: Medicare Other

## 2021-10-17 DIAGNOSIS — E1122 Type 2 diabetes mellitus with diabetic chronic kidney disease: Secondary | ICD-10-CM

## 2021-10-17 DIAGNOSIS — I10 Essential (primary) hypertension: Secondary | ICD-10-CM

## 2021-10-17 DIAGNOSIS — E782 Mixed hyperlipidemia: Secondary | ICD-10-CM

## 2021-10-17 DIAGNOSIS — N1831 Chronic kidney disease, stage 3a: Secondary | ICD-10-CM

## 2021-10-18 NOTE — Patient Instructions (Signed)
Visit Information  Thank you for taking time to visit with me today. Please don't hesitate to contact me if I can be of assistance to you before our next scheduled telephone appointment.  Following are the goals we discussed today:  (Copy and paste patient goals from clinical care plan here)  Our next appointment is by telephone on 11/20/21 at 10:20 AM  Please call the care guide team at [phone] if you need to cancel or reschedule your appointment.   If you are experiencing a Mental Health or Behavioral Health Crisis or need someone to talk to, please call 1-800-273-TALK (toll free, 24 hour hotline)   Patient verbalizes understanding of instructions and care plan provided today and agrees to view in MyChart. Active MyChart status confirmed with patient.    Delsa Sale, RN, BSN, CCM Care Management Coordinator [facility] Care Management/[facility]  Direct Phone: [phone]

## 2021-10-18 NOTE — Chronic Care Management (AMB) (Signed)
Chronic Care Management   CCM RN Visit Note  10/17/2021 Name: Sarah Weaver MRN: [id number] DOB: 22-Nov-1935  Subjective: Sarah Weaver is a 86 y.o. year old female who is a primary care patient of [name], [facility]. The care management team was consulted for assistance with disease management and care coordination needs.    Engaged with patient by telephone for follow up visit in response to provider referral for case management and/or care coordination services.   Consent to Services:  The patient was given information about Chronic Care Management services, agreed to services, and gave verbal consent prior to initiation of services.  Please see initial visit note for detailed documentation.   Patient agreed to services and verbal consent obtained.   Assessment: Review of patient past medical history, allergies, medications, health status, including review of consultants reports, laboratory and other test data, was performed as part of comprehensive evaluation and provision of chronic care management services.   SDOH (Social Determinants of Health) assessments and interventions performed:  Yes, no acute challenges   CCM Care Plan  Allergies  Allergen Reactions   Carvedilol Cough    Pt reports causes her to cough   Lisinopril Cough    REPORTS CAUSES DRY COUGH    Outpatient Encounter Medications as of 10/17/2021  Medication Sig   Accu-Chek Softclix Lancets lancets USE UP TO 4 TIMES DAILY AS DIRECTED   albuterol (VENTOLIN HFA) 108 (90 Base) MCG/ACT inhaler Inhale 2 puffs into the lungs every 6 (six) hours as needed for wheezing or shortness of breath.   aspirin EC 81 MG tablet Take 81 mg by mouth daily.   atorvastatin (LIPITOR) 80 MG tablet Take 1 tablet by mouth at bedtime   benzonatate (TESSALON PERLES) 100 MG capsule Take 1 capsule (100 mg total) by mouth 3 (three) times daily as needed for cough.   brinzolamide (AZOPT) 1 % ophthalmic suspension Place 1  drop into both eyes 3 (three) times daily.   Cholecalciferol 125 MCG (5000 UT) TABS Take 1 tablet by mouth daily.   empagliflozin (JARDIANCE) 10 MG TABS tablet Take 1 tablet (10 mg total) by mouth daily before breakfast.   ezetimibe (ZETIA) 10 MG tablet TAKE 1 TABLET BY MOUTH EVERY DAY   furosemide (LASIX) 40 MG tablet Take 1 tablet (40 mg total) by mouth daily.   Insulin Pen Needle (BD PEN NEEDLE NANO U/F) 32G X 4 MM MISC USE NIGHTLY AS DIRECTED   Latanoprostene Bunod 0.024 % SOLN Apply 1 drop to eye daily.   levothyroxine (SYNTHROID) 50 MCG tablet TAKE 1 TABLET BY MOUTH EVERY DAY BEFORE BREAKFAST   LINZESS 145 MCG CAPS capsule TAKE 1 CAPSULE (145 MCG TOTAL) BY MOUTH DAILY BEFORE BREAKFAST. FOR CONSTIPATION   losartan (COZAAR) 50 MG tablet Take 50 mg by mouth at bedtime.   Magnesium 250 MG TABS Take 1 tablet (250 mg total) by mouth every evening. Take with evening meal   nebivolol (BYSTOLIC) 2.5 MG tablet Take 1 tablet (2.5 mg total) by mouth daily.   nystatin (NYSTATIN) powder Apply 1 application topically 3 (three) times daily.   OZEMPIC, 0.25 OR 0.5 MG/DOSE, 2 MG/1.5ML SOPN INJECT 0.25 MG INTO THE SKIN ONCE A WEEK.   traMADol-acetaminophen (ULTRACET) 37.5-325 MG tablet TAKE 1 TABLET BY MOUTH EVERY 6 HOURS AS NEEDED FOR MODERATE BACK PAIN   TRESIBA FLEXTOUCH 100 UNIT/ML FlexTouch Pen INJECT 10 UNITS INTO THE SKIN DAILY. (Patient taking differently: 15 Units.)   blood glucose meter kit and supplies  KIT Dispense based on patient and insurance preference. Use up to four times daily as directed. (FOR ICD-9 250.00, 250.01).   ONETOUCH VERIO test strip CHECK BLOOD SUGARS ONCE DAILY   No facility-administered encounter medications on file as of 10/17/2021.    Patient Active Problem List   Diagnosis Date Noted   Mobitz (type) I (Wenckebach's) atrioventricular block 10/15/2021   Heart palpitations 10/14/2021   Class 3 severe obesity due to excess calories with serious comorbidity in adult [facility])  08/25/2019   Diabetes mellitus due to underlying condition with stage 3a chronic kidney disease, with long-term current use of insulin ([place]) 06/16/2019   CAD (coronary artery disease) 07/26/2018   (HFpEF) heart failure with preserved ejection fraction ([place]) 07/26/2018   Chronic kidney disease (CKD), stage III (moderate) (HCC) 07/26/2018   Chest pain, moderate coronary artery risk 12/20/2015   DM2 (diabetes mellitus, type 2) ([place]) 12/20/2015   HTN (hypertension) 12/20/2015   HLD (hyperlipidemia) 12/20/2015   Ischemic chest pain ([place]) 12/20/2015   Hx of NSTEMI in 2017    Acute renal failure ([place])    Cholelithiasis 04/14/2011    Conditions to be addressed/monitored: HTN, HLD, DM II, Stage 3a Chronic Kidney disease  Care Plan : RN Care Manager Plan of Care  Updates made by Lynne Logan, RN since 10/17/2021 12:00 AM     Problem: No plan established for management of chronic disease states (HTN, HLD, DM II, Stage 3a Chronic Kidney disease)   Priority: High     Long-Range Goal: Establish plan of care for management of chronic disease states (HTN, HLD, DM II, Stage 3a Chronic Kidney disease)   Start Date: 07/22/2021  Expected End Date: 07/22/2022  Recent Progress: On track  Priority: High  Note:   Current Barriers:  Knowledge Deficits related to plan of care for management of HTN, HLD, DM II, Stage 3a Chronic Kidney disease Chronic Disease Management support and education needs related to HTN, HLD, DM II, Stage 3a Chronic Kidney disease  RNCM Clinical Goal(s):  Patient will verbalize basic understanding of  HTN, HLD, DM II, Stage 3a Chronic Kidney disease disease process and self health management plan   demonstrate Improved health management independence   continue to work with RN Care Manager to address care management and care coordination needs related to  HTN, HLD, DM II, Stage 3a Chronic Kidney disease will demonstrate ongoing self health care management ability    through  collaboration with RN Care manager, provider, and care team.   Interventions: 1:1 collaboration with primary care provider regarding development and update of comprehensive plan of care as evidenced by provider attestation and co-signature Inter-disciplinary care team collaboration (see longitudinal plan of care) Evaluation of current treatment plan related to  self management and patient's adherence to plan as established by provider  Hypertension Interventions:  (Status:  Condition stable.  Not addressed this visit.) Long Term Goal Last practice recorded BP readings:  BP Readings from Last 3 Encounters:  08/20/21 122/80  07/25/21 (!) 148/82  06/26/21 130/72  Most recent eGFR/CrCl:  Lab Results  Component Value Date   EGFR 44 (L) 06/26/2021    No components found for: CRCL Evaluation of current treatment plan related to hypertension self management and patient's adherence to plan as established by provider Reviewed medications with patient and discussed importance of compliance Counseled on the importance of exercise goals with target of 150 minutes per week Advised patient, providing education and rationale, to monitor blood pressure daily and record,  calling PCP for findings outside established parameters Provided education on prescribed diet low Sodium Discussed complications of poorly controlled blood pressure such as heart disease, stroke, circulatory complications, vision complications, kidney impairment, sexual dysfunction Assessed social determinant of health barriers Instructed on how to accurately monitor BP at home Discussed plans with patient for ongoing care management follow up and provided patient with direct contact information for care management team  Diabetes Interventions: (Status:  Condition stable.  Not addressed this visit.)  Assessed patient's understanding of A1c goal: <7% Provided education to patient about basic DM disease process Reviewed medications with  patient and discussed importance of medication adherence Counseled on importance of regular laboratory monitoring as prescribed Discussed plans with patient for ongoing care management follow up and provided patient with direct contact information for care management team Advised patient, providing education and rationale, to check cbg daily before meals and at bedtime and record, calling PCP and or RN CM for findings outside established parameters Review of patient status, including review of consultants reports, relevant laboratory and other test results, and medications completed Mailed printed letter related to "The Dangers in Skipping Meals"; "Carb Choice List" Lab Results  Component Value Date   HGBA1C 7.2 (H) 06/26/2021        Chronic Kidney Disease Interventions:  (Status:  Goal on track:  Yes.) Long Term Goal Assessed the Patient understanding of chronic kidney disease    Evaluation of current treatment plan related to chronic kidney disease self management and patient's adherence to plan as established by provider      Reviewed prescribed diet increase daily water intake to 48-64 oz unless otherwise directed  Discussed the impact of chronic kidney disease on daily life and mental health and acknowledged and normalized feelings of disempowerment, fear, and frustration    Provided education on kidney disease progression    Engage patient in early, proactive and ongoing discussion about goals of care and what matters most to them    Mailed printed educational materials related to Eating Right with Chronic Kidney disease; Stages of Chronic Kidney disease  Discussed plans with patient for ongoing care management follow up and provided patient with direct contact information for care management team Last practice recorded BP readings:  BP Readings from Last 3 Encounters:  10/15/21 140/80  08/20/21 122/80  07/25/21 (!) 148/82  Most recent eGFR/CrCl:  Lab Results  Component Value Date    EGFR 36 (L) 10/15/2021    No components found for: CRCL   High Risk for Neuropathy: (Status:  Condition stable.  Not addressed this visit.)  Long Term Goal Return phone call to patient to discuss symptoms related to abnormal sensation to her legs  Evaluation of current treatment plan related to  abnormal sensation to bilateral legs , self-management and patient's adherence to plan as established by provider Determined patient is experiencing a "crawling sensation" to both upper legs from her knees to her inner thighs, patient denies pain or cramping, PCP was notified during last PCP visit  Reviewed and discussed next scheduled appointment with Cardiology is scheduled for 10/15/21 @8 :25 AM with Dr. Kathlen Mody Discussed patient plans to discuss these symptoms with Cardiology during this visit, she denies having additional questions at this time  Discussed plans with patient for ongoing care management follow up and provided patient with direct contact information for care management team   Patient Goals/Self-Care Activities: Take all medications as prescribed Attend all scheduled provider appointments Call pharmacy for medication refills 3-7 days in advance  of running out of medications Perform all self care activities independently  Call provider office for new concerns or questions  drink 6 to 8 glasses of water each day manage portion size take medications for blood pressure exactly as prescribed  Follow Up Plan:  Telephone follow up appointment with care management team member scheduled for:  11/20/21      Plan:Telephone follow up appointment with care management team member scheduled for:  11/20/21  Barb Merino, RN, BSN, CCM Care Management Coordinator [facility]  Direct Phone: [phone]

## 2021-10-31 DIAGNOSIS — E119 Type 2 diabetes mellitus without complications: Secondary | ICD-10-CM | POA: Diagnosis not present

## 2021-10-31 DIAGNOSIS — H401131 Primary open-angle glaucoma, bilateral, mild stage: Secondary | ICD-10-CM | POA: Diagnosis not present

## 2021-10-31 DIAGNOSIS — Z961 Presence of intraocular lens: Secondary | ICD-10-CM | POA: Diagnosis not present

## 2021-10-31 LAB — HM DIABETES EYE EXAM

## 2021-11-03 ENCOUNTER — Other Ambulatory Visit: Payer: Self-pay | Admitting: Nurse Practitioner

## 2021-11-12 DIAGNOSIS — I129 Hypertensive chronic kidney disease with stage 1 through stage 4 chronic kidney disease, or unspecified chronic kidney disease: Secondary | ICD-10-CM | POA: Diagnosis not present

## 2021-11-12 DIAGNOSIS — N1832 Chronic kidney disease, stage 3b: Secondary | ICD-10-CM

## 2021-11-12 DIAGNOSIS — N1831 Chronic kidney disease, stage 3a: Secondary | ICD-10-CM | POA: Diagnosis not present

## 2021-11-12 DIAGNOSIS — E1122 Type 2 diabetes mellitus with diabetic chronic kidney disease: Secondary | ICD-10-CM

## 2021-11-18 ENCOUNTER — Other Ambulatory Visit: Payer: Self-pay

## 2021-11-18 ENCOUNTER — Ambulatory Visit (INDEPENDENT_AMBULATORY_CARE_PROVIDER_SITE_OTHER): Payer: Medicare Other | Admitting: Nurse Practitioner

## 2021-11-18 ENCOUNTER — Encounter: Payer: Self-pay | Admitting: Nurse Practitioner

## 2021-11-18 ENCOUNTER — Telehealth: Payer: Self-pay

## 2021-11-18 VITALS — BP 130/74 | HR 79 | Temp 97.6°F | Ht 64.0 in | Wt 296.0 lb

## 2021-11-18 DIAGNOSIS — Z6841 Body Mass Index (BMI) 40.0 and over, adult: Secondary | ICD-10-CM

## 2021-11-18 DIAGNOSIS — E1122 Type 2 diabetes mellitus with diabetic chronic kidney disease: Secondary | ICD-10-CM | POA: Diagnosis not present

## 2021-11-18 DIAGNOSIS — I10 Essential (primary) hypertension: Secondary | ICD-10-CM | POA: Diagnosis not present

## 2021-11-18 DIAGNOSIS — N1832 Chronic kidney disease, stage 3b: Secondary | ICD-10-CM

## 2021-11-18 DIAGNOSIS — E662 Morbid (severe) obesity with alveolar hypoventilation: Secondary | ICD-10-CM

## 2021-11-18 DIAGNOSIS — E782 Mixed hyperlipidemia: Secondary | ICD-10-CM | POA: Diagnosis not present

## 2021-11-18 MED ORDER — TRESIBA FLEXTOUCH 100 UNIT/ML ~~LOC~~ SOPN: 15.0000 [IU] | PEN_INJECTOR | Freq: Every day | SUBCUTANEOUS | 1 refills | Status: DC

## 2021-11-18 MED ORDER — OZEMPIC (0.25 OR 0.5 MG/DOSE) 2 MG/1.5ML ~~LOC~~ SOPN: 0.5000 mg | PEN_INJECTOR | SUBCUTANEOUS | 1 refills | Status: DC

## 2021-11-18 NOTE — Patient Instructions (Addendum)

## 2021-11-18 NOTE — Chronic Care Management (AMB) (Signed)
? ? ?  Chronic Care Management ?Pharmacy Assistant  ? ?Name: Sharay Bellissimo  MRN: [id number] DOB: 1936/05/10 ? ?Reason for Encounter: Reschedule appointment ?  ?Medications: ?Outpatient Encounter Medications as of 11/18/2021  ?Medication Sig  ? Accu-Chek Softclix Lancets lancets USE UP TO 4 TIMES DAILY AS DIRECTED  ? albuterol (VENTOLIN HFA) 108 (90 Base) MCG/ACT inhaler Inhale 2 puffs into the lungs every 6 (six) hours as needed for wheezing or shortness of breath.  ? aspirin EC 81 MG tablet Take 81 mg by mouth daily.  ? atorvastatin (LIPITOR) 80 MG tablet Take 1 tablet by mouth at bedtime  ? benzonatate (TESSALON PERLES) 100 MG capsule Take 1 capsule (100 mg total) by mouth 3 (three) times daily as needed for cough.  ? blood glucose meter kit and supplies KIT Dispense based on patient and insurance preference. Use up to four times daily as directed. (FOR ICD-9 250.00, 250.01).  ? brinzolamide (AZOPT) 1 % ophthalmic suspension Place 1 drop into both eyes 3 (three) times daily.  ? Cholecalciferol 125 MCG (5000 UT) TABS Take 1 tablet by mouth daily.  ? empagliflozin (JARDIANCE) 10 MG TABS tablet Take 1 tablet (10 mg total) by mouth daily before breakfast.  ? ezetimibe (ZETIA) 10 MG tablet TAKE 1 TABLET BY MOUTH EVERY DAY  ? furosemide (LASIX) 40 MG tablet Take 1 tablet (40 mg total) by mouth daily.  ? Insulin Pen Needle (BD PEN NEEDLE NANO U/F) 32G X 4 MM MISC USE NIGHTLY AS DIRECTED  ? Latanoprostene Bunod 0.024 % SOLN Apply 1 drop to eye daily.  ? levothyroxine (SYNTHROID) 50 MCG tablet TAKE 1 TABLET BY MOUTH EVERY DAY BEFORE BREAKFAST  ? LINZESS 145 MCG CAPS capsule TAKE 1 CAPSULE (145 MCG TOTAL) BY MOUTH DAILY BEFORE BREAKFAST. FOR CONSTIPATION  ? losartan (COZAAR) 50 MG tablet Take 50 mg by mouth at bedtime.  ? Magnesium 250 MG TABS Take 1 tablet (250 mg total) by mouth every evening. Take with evening meal  ? nebivolol (BYSTOLIC) 2.5 MG tablet Take 1 tablet (2.5 mg total) by mouth daily.  ? nystatin  (NYSTATIN) powder Apply 1 application topically 3 (three) times daily.  ? ONETOUCH VERIO test strip CHECK BLOOD SUGARS ONCE DAILY  ? OZEMPIC, 0.25 OR 0.5 MG/DOSE, 2 MG/1.5ML SOPN INJECT 0.25 MG INTO THE SKIN ONCE A WEEK.  ? traMADol-acetaminophen (ULTRACET) 37.5-325 MG tablet TAKE 1 TABLET BY MOUTH EVERY 6 HOURS AS NEEDED FOR MODERATE BACK PAIN  ? TRESIBA FLEXTOUCH 100 UNIT/ML FlexTouch Pen INJECT 10 UNITS INTO THE SKIN DAILY. (Patient taking differently: 15 Units.)  ? ?No facility-administered encounter medications on file as of 11/18/2021.  ? ?11-18-2021: Called patient to reschedule appointment in March to July per Orlando Penner. Patient was rescheduled to 03-19-2022 at 2:00. ? ?Malecca Hicks CMA ?Clinical Pharmacist Assistant ?[phone] ? ?

## 2021-11-18 NOTE — Progress Notes (Signed)
I,[name],acting as a Education administrator for [employer], FNP.,have documented all relevant documentation on the behalf of [name], FNP,as directed by  [name], FNP while in the presence of [name], [place].  This visit occurred during the SARS-CoV-2 public health emergency.  Safety protocols were in place, including screening questions prior to the visit, additional usage of staff PPE, and extensive cleaning of exam room while observing appropriate contact time as indicated for disinfecting solutions.  Subjective:     Patient ID: Sarah Weaver , female    DOB: 08/11/1936 , 86 y.o.   MRN: [id number]   Chief Complaint  Patient presents with   Diabetes   Hypertension    HPI  Patient presents today for a f/u on her bp and dm.  She has seen Dr. Lenard Forth - Cardiology.  Her blood sugars have been 120's. She is tolerating Ozempic well - takes every Tuesday    Wt Readings from Last 3 Encounters: 11/18/21 : 296 lb (134.3 kg) 10/15/21 : 297 lb 9.6 oz (135 kg) 08/20/21 : 298 lb 6.4 oz (135.4 kg)  She eats 2 meals a day with her evening meal around 5 mg  Diabetes She presents for her follow-up diabetic visit. She has type 2 diabetes mellitus. There are no hypoglycemic associated symptoms. There are no hypoglycemic complications. There are no diabetic complications. Risk factors for coronary artery disease include diabetes mellitus, obesity and sedentary lifestyle. Current diabetic treatment includes oral agent (dual therapy). (Blood sugar averages 120's)  Hypertension    Past Medical History:  Diagnosis Date   Arthritis    "all over"   Chronic lower back pain    CKD Stage III 07/26/2018   Hx of hyperkalemia   Coronary artery disease    a. status post DES to the LAD 12/2015.   Glaucoma    HFpEF    Echocardiogram 12/2019: EF 60-65, no RWMA, mild LVH, Gr 1 DD, trivial MR, mild AoV sclerosis, no AS   Hyperkalemia    Hyperlipidemia    Hypertension    Mobitz (type) I  (Wenckebach's) atrioventricular block 10/15/2021   Palpitations Zio Patch Monitor 5/21 NSR, avg HR 67, 1st degree AV block, intermittent Wenckebach during sleep, rare PACs/PVCs   Morbid obesity (HCC)    Palpitations    Type II diabetes mellitus ([place])      Family History  Problem Relation Age of Onset   Heart disease Mother    Heart attack Mother    Hypertension Mother    Heart disease Sister    Cancer Sister    Stroke Sister      Current Outpatient Medications:    Accu-Chek Softclix Lancets lancets, USE UP TO 4 TIMES DAILY AS DIRECTED, Disp: 100 each, Rfl: 3   albuterol (VENTOLIN HFA) 108 (90 Base) MCG/ACT inhaler, Inhale 2 puffs into the lungs every 6 (six) hours as needed for wheezing or shortness of breath., Disp: 8 g, Rfl: 2   aspirin EC 81 MG tablet, Take 81 mg by mouth daily., Disp: , Rfl:    atorvastatin (LIPITOR) 80 MG tablet, Take 1 tablet by mouth at bedtime, Disp: 90 tablet, Rfl: 2   benzonatate (TESSALON PERLES) 100 MG capsule, Take 1 capsule (100 mg total) by mouth 3 (three) times daily as needed for cough., Disp: 30 capsule, Rfl: 1   blood glucose meter kit and supplies KIT, Dispense based on patient and insurance preference. Use up to four times daily as directed. (FOR ICD-9 250.00, 250.01)., Disp: 1  each, Rfl: 0   brinzolamide (AZOPT) 1 % ophthalmic suspension, Place 1 drop into both eyes 3 (three) times daily., Disp: , Rfl:    Cholecalciferol 125 MCG (5000 UT) TABS, Take 1 tablet by mouth daily., Disp: , Rfl:    empagliflozin (JARDIANCE) 10 MG TABS tablet, Take 1 tablet (10 mg total) by mouth daily before breakfast., Disp: 90 tablet, Rfl: 3   ezetimibe (ZETIA) 10 MG tablet, TAKE 1 TABLET BY MOUTH EVERY DAY, Disp: 90 tablet, Rfl: 3   furosemide (LASIX) 40 MG tablet, Take 1 tablet (40 mg total) by mouth daily., Disp: 90 tablet, Rfl: 3   insulin degludec (TRESIBA FLEXTOUCH) 100 UNIT/ML FlexTouch Pen, Inject 15 Units into the skin daily., Disp: 15 mL, Rfl: 1   Insulin Pen  Needle (BD PEN NEEDLE NANO U/F) 32G X 4 MM MISC, USE NIGHTLY AS DIRECTED, Disp: 100 each, Rfl: 3   Latanoprostene Bunod 0.024 % SOLN, Apply 1 drop to eye daily., Disp: , Rfl:    levothyroxine (SYNTHROID) 50 MCG tablet, TAKE 1 TABLET BY MOUTH EVERY DAY BEFORE BREAKFAST, Disp: 90 tablet, Rfl: 1   LINZESS 145 MCG CAPS capsule, TAKE 1 CAPSULE (145 MCG TOTAL) BY MOUTH DAILY BEFORE BREAKFAST. FOR CONSTIPATION, Disp: 90 capsule, Rfl: 0   losartan (COZAAR) 50 MG tablet, Take 50 mg by mouth at bedtime., Disp: , Rfl:    Magnesium 250 MG TABS, Take 1 tablet (250 mg total) by mouth every evening. Take with evening meal, Disp: 90 tablet, Rfl: 1   nebivolol (BYSTOLIC) 2.5 MG tablet, Take 1 tablet (2.5 mg total) by mouth daily., Disp: 90 tablet, Rfl: 1   nystatin (NYSTATIN) powder, Apply 1 application topically 3 (three) times daily., Disp: 15 g, Rfl: 0   ONETOUCH VERIO test strip, CHECK BLOOD SUGARS ONCE DAILY, Disp: 50 strip, Rfl: 11   Semaglutide,0.25 or 0.5MG/DOS, (OZEMPIC, 0.25 OR 0.5 MG/DOSE,) 2 MG/1.5ML SOPN, Inject 0.5 mg into the skin once a week., Disp: 4.5 mL, Rfl: 1   traMADol-acetaminophen (ULTRACET) 37.5-325 MG tablet, TAKE 1 TABLET BY MOUTH EVERY 6 HOURS AS NEEDED FOR MODERATE BACK PAIN, Disp: 20 tablet, Rfl: 0   Allergies  Allergen Reactions   Carvedilol Cough    Pt reports causes her to cough   Lisinopril Cough    REPORTS CAUSES DRY COUGH     Review of Systems  Constitutional: Negative.   Respiratory: Negative.    Cardiovascular: Negative.   Gastrointestinal: Negative.   Neurological: Negative.     Today's Vitals   11/18/21 1553  BP: 130/74  Pulse: 79  Temp: 97.6 F (36.4 C)  TempSrc: Oral  Weight: 296 lb (134.3 kg)  Height: _0  (1.626 m)   Body mass index is 50.81 kg/m.  Wt Readings from Last 3 Encounters:  11/18/21 296 lb (134.3 kg)  10/15/21 297 lb 9.6 oz (135 kg)  08/20/21 298 lb 6.4 oz (135.4 kg)    Objective:  Physical Exam Vitals reviewed.  Constitutional:       General: She is not in acute distress.    Appearance: Normal appearance. She is obese.  Cardiovascular:     Rate and Rhythm: Normal rate and regular rhythm.     Pulses: Normal pulses.     Heart sounds: Normal heart sounds. No murmur heard. Pulmonary:     Effort: Pulmonary effort is normal. No respiratory distress.     Breath sounds: No wheezing or rhonchi.  Musculoskeletal:     Cervical back: Normal range of motion  and neck supple.  Skin:    General: Skin is warm and dry.     Capillary Refill: Capillary refill takes less than 2 seconds.  Neurological:     General: No focal deficit present.     Mental Status: She is alert and oriented to person, place, and time.     Cranial Nerves: No cranial nerve deficit.     Motor: No weakness.  Psychiatric:        Mood and Affect: Mood normal.        Behavior: Behavior normal.        Thought Content: Thought content normal.        Judgment: Judgment normal.        Assessment And Plan:     1. Essential hypertension Comments: Blood pressure is normal, continue current medications.   2. Type 2 diabetes mellitus with stage 3b chronic kidney disease, without long-term current use of insulin ([place]) Comments: Will increase Ozempic to 0.5 mg. Will check HgbA1c.  - Hemoglobin A1c - insulin degludec (TRESIBA FLEXTOUCH) 100 UNIT/ML FlexTouch Pen; Inject 15 Units into the skin daily.  Dispense: 15 mL; Refill: 1  3. Mixed hyperlipidemia - Lipid panel - Semaglutide,0.25 or 0.5MG/DOS, (OZEMPIC, 0.25 OR 0.5 MG/DOSE,) 2 MG/1.5ML SOPN; Inject 0.5 mg into the skin once a week.  Dispense: 4.5 mL; Refill: 1  4. Class 3 obesity with alveolar hypoventilation and body mass index (BMI) of 50.0 to 59.9 in adult, unspecified whether serious comorbidity present [facility]) She is encouraged to strive for BMI less than 30 to decrease cardiac risk. Advised to aim for at least 150 minutes of exercise per week.    Patient was given opportunity to ask questions.  Patient verbalized understanding of the plan and was able to repeat key elements of the plan. All questions were answered to their satisfaction.  [name], FNP   I, [name], FNP, have reviewed all documentation for this visit. The documentation on 11/18/21 for the exam, diagnosis, procedures, and orders are all accurate and complete.   IF YOU HAVE BEEN REFERRED TO A SPECIALIST, IT MAY TAKE 1-2 WEEKS TO SCHEDULE/PROCESS THE REFERRAL. IF YOU HAVE NOT HEARD FROM US/SPECIALIST IN TWO WEEKS, PLEASE GIVE [place] A CALL AT [phone].   THE PATIENT IS ENCOURAGED TO PRACTICE SOCIAL DISTANCING DUE TO THE COVID-19 PANDEMIC.

## 2021-11-19 LAB — LIPID PANEL
Chol/HDL Ratio: 2.4 ratio (ref 0.0–4.4)
Cholesterol, Total: 122 mg/dL (ref 100–199)
HDL: 50 mg/dL (ref >39)
LDL Chol Calc (NIH): 55 mg/dL (ref 0–99)
Triglycerides: 90 mg/dL (ref 0–149)
VLDL Cholesterol Cal: 17 mg/dL (ref 5–40)

## 2021-11-19 LAB — HEMOGLOBIN A1C
Est. average glucose Bld gHb Est-mCnc: 154 mg/dL
Hgb A1c MFr Bld: 7 % — ABNORMAL HIGH (ref 4.8–5.6)

## 2021-11-20 ENCOUNTER — Ambulatory Visit (INDEPENDENT_AMBULATORY_CARE_PROVIDER_SITE_OTHER): Payer: Medicare Other

## 2021-11-20 ENCOUNTER — Telehealth: Payer: Medicare Other

## 2021-11-20 DIAGNOSIS — I10 Essential (primary) hypertension: Secondary | ICD-10-CM

## 2021-11-20 DIAGNOSIS — N1832 Chronic kidney disease, stage 3b: Secondary | ICD-10-CM

## 2021-11-20 DIAGNOSIS — N1831 Chronic kidney disease, stage 3a: Secondary | ICD-10-CM

## 2021-11-20 DIAGNOSIS — E782 Mixed hyperlipidemia: Secondary | ICD-10-CM

## 2021-11-21 NOTE — Chronic Care Management (AMB) (Cosign Needed)
Chronic Care Management   CCM RN Visit Note  11/20/2021 Name: Sarah Weaver MRN: [id number] DOB: Sep 02, 1936  Subjective: Sarah Weaver is a 85 y.o. year old female who is a primary care patient of [name], [place]. The care management team was consulted for assistance with disease management and care coordination needs.    Engaged with patient by telephone for follow up visit in response to provider referral for case management and/or care coordination services.   Consent to Services:  The patient was given information about Chronic Care Management services, agreed to services, and gave verbal consent prior to initiation of services.  Please see initial visit note for detailed documentation.   Patient agreed to services and verbal consent obtained.   Assessment: Review of patient past medical history, allergies, medications, health status, including review of consultants reports, laboratory and other test data, was performed as part of comprehensive evaluation and provision of chronic care management services.   SDOH (Social Determinants of Health) assessments and interventions performed:  Yes, no acute challenges  CCM Care Plan  Allergies  Allergen Reactions   Carvedilol Cough    Pt reports causes her to cough   Lisinopril Cough    REPORTS CAUSES DRY COUGH    Outpatient Encounter Medications as of 11/20/2021  Medication Sig   Accu-Chek Softclix Lancets lancets USE UP TO 4 TIMES DAILY AS DIRECTED   albuterol (VENTOLIN HFA) 108 (90 Base) MCG/ACT inhaler Inhale 2 puffs into the lungs every 6 (six) hours as needed for wheezing or shortness of breath.   aspirin EC 81 MG tablet Take 81 mg by mouth daily.   atorvastatin (LIPITOR) 80 MG tablet Take 1 tablet by mouth at bedtime   benzonatate (TESSALON PERLES) 100 MG capsule Take 1 capsule (100 mg total) by mouth 3 (three) times daily as needed for cough.   blood glucose meter kit and supplies KIT Dispense based  on patient and insurance preference. Use up to four times daily as directed. (FOR ICD-9 250.00, 250.01).   brinzolamide (AZOPT) 1 % ophthalmic suspension Place 1 drop into both eyes 3 (three) times daily.   Cholecalciferol 125 MCG (5000 UT) TABS Take 1 tablet by mouth daily.   empagliflozin (JARDIANCE) 10 MG TABS tablet Take 1 tablet (10 mg total) by mouth daily before breakfast.   ezetimibe (ZETIA) 10 MG tablet TAKE 1 TABLET BY MOUTH EVERY DAY   furosemide (LASIX) 40 MG tablet Take 1 tablet (40 mg total) by mouth daily.   insulin degludec (TRESIBA FLEXTOUCH) 100 UNIT/ML FlexTouch Pen Inject 15 Units into the skin daily.   Insulin Pen Needle (BD PEN NEEDLE NANO U/F) 32G X 4 MM MISC USE NIGHTLY AS DIRECTED   Latanoprostene Bunod 0.024 % SOLN Apply 1 drop to eye daily.   levothyroxine (SYNTHROID) 50 MCG tablet TAKE 1 TABLET BY MOUTH EVERY DAY BEFORE BREAKFAST   LINZESS 145 MCG CAPS capsule TAKE 1 CAPSULE (145 MCG TOTAL) BY MOUTH DAILY BEFORE BREAKFAST. FOR CONSTIPATION   losartan (COZAAR) 50 MG tablet Take 50 mg by mouth at bedtime.   Magnesium 250 MG TABS Take 1 tablet (250 mg total) by mouth every evening. Take with evening meal   nebivolol (BYSTOLIC) 2.5 MG tablet Take 1 tablet (2.5 mg total) by mouth daily.   nystatin (NYSTATIN) powder Apply 1 application topically 3 (three) times daily.   ONETOUCH VERIO test strip CHECK BLOOD SUGARS ONCE DAILY   Semaglutide,0.25 or 0.5MG/DOS, (OZEMPIC, 0.25 OR 0.5 MG/DOSE,) 2 MG/1.5ML SOPN  Inject 0.5 mg into the skin once a week.   traMADol-acetaminophen (ULTRACET) 37.5-325 MG tablet TAKE 1 TABLET BY MOUTH EVERY 6 HOURS AS NEEDED FOR MODERATE BACK PAIN   No facility-administered encounter medications on file as of 11/20/2021.    Patient Active Problem List   Diagnosis Date Noted   Mobitz (type) I (Wenckebach's) atrioventricular block 10/15/2021   Heart palpitations 10/14/2021   Class 3 severe obesity due to excess calories with serious comorbidity in adult  [facility]) 08/25/2019   Diabetes mellitus due to underlying condition with stage 3a chronic kidney disease, with long-term current use of insulin ([place]) 06/16/2019   CAD (coronary artery disease) 07/26/2018   (HFpEF) heart failure with preserved ejection fraction ([place]) 07/26/2018   Chronic kidney disease (CKD), stage III (moderate) (HCC) 07/26/2018   Chest pain, moderate coronary artery risk 12/20/2015   DM2 (diabetes mellitus, type 2) ([place]) 12/20/2015   HTN (hypertension) 12/20/2015   HLD (hyperlipidemia) 12/20/2015   Ischemic chest pain ([place]) 12/20/2015   Hx of NSTEMI in 2017    Acute renal failure ([place])    Cholelithiasis 04/14/2011    Conditions to be addressed/monitored: HTN, HLD, DM II, Stage 3a Chronic Kidney disease  Care Plan : RN Care Manager Plan of Care  Updates made by Lynne Logan, RN since 11/20/2021 12:00 AM     Problem: No plan established for management of chronic disease states (HTN, HLD, DM II, Stage 3a Chronic Kidney disease)   Priority: High     Long-Range Goal: Establish plan of care for management of chronic disease states (HTN, HLD, DM II, Stage 3a Chronic Kidney disease)   Start Date: 07/22/2021  Expected End Date: 07/22/2022  Recent Progress: On track  Priority: High  Note:   Current Barriers:  Knowledge Deficits related to plan of care for management of HTN, HLD, DM II, Stage 3a Chronic Kidney disease Chronic Disease Management support and education needs related to HTN, HLD, DM II, Stage 3a Chronic Kidney disease  RNCM Clinical Goal(s):  Patient will verbalize basic understanding of  HTN, HLD, DM II, Stage 3a Chronic Kidney disease disease process and self health management plan   demonstrate Improved health management independence   continue to work with RN Care Manager to address care management and care coordination needs related to  HTN, HLD, DM II, Stage 3a Chronic Kidney disease will demonstrate ongoing self health care management ability    through  collaboration with RN Care manager, provider, and care team.   Interventions: 1:1 collaboration with primary care provider regarding development and update of comprehensive plan of care as evidenced by provider attestation and co-signature Inter-disciplinary care team collaboration (see longitudinal plan of care) Evaluation of current treatment plan related to  self management and patient's adherence to plan as established by provider   Hypertension Interventions:  (Status:  Goal on track:  Yes.) Long Term Goal Last practice recorded BP readings:  BP Readings from Last 3 Encounters:  11/18/21 130/74  10/15/21 140/80  08/20/21 122/80  Most recent eGFR/CrCl:  Lab Results  Component Value Date   EGFR 36 (L) 10/15/2021    No components found for: CRCL Evaluation of current treatment plan related to hypertension self management and patient's adherence to plan as established by provider Reviewed medications with patient and discussed importance of compliance Counseled on the importance of exercise goals with target of 150 minutes per week Advised patient, providing education and rationale, to monitor blood pressure daily and record, calling PCP for  findings outside established parameters Provided education on prescribed diet low Sodium    Diabetes Interventions:  (Status:  Goal on track:  Yes.) Long Term Goal Assessed patient's understanding of A1c goal: <7% Provided education to patient about basic DM disease process Reviewed medications with patient and discussed importance of medication adherence Counseled on importance of regular laboratory monitoring as prescribed Advised patient, providing education and rationale, to check cbg daily before meals and at bedtime and record, calling PCP for findings outside established parameters Review of patient status, including review of consultants reports, relevant laboratory and other test results, and medications completed Educated patient on  dietary and exercise recommendations; daily glycemic control FBS 80-130, <180 after meals;15'15' rule Lab Results  Component Value Date   HGBA1C 7.0 (H) 11/18/2021      Chronic Kidney Disease Interventions:  (Status:  Goal on track:  Yes.) Long Term Goal Assessed the Patient understanding of chronic kidney disease    Evaluation of current treatment plan related to chronic kidney disease self management and patient's adherence to plan as established by provider      Reviewed prescribed diet continue to increase water to 48-64 oz daily unless otherwise directed Provided education on kidney disease progression    Mailed printed educational materials related to Chronic Kidney disease  Last practice recorded BP readings:  BP Readings from Last 3 Encounters:  11/18/21 130/74  10/15/21 140/80  08/20/21 122/80  Most recent eGFR/CrCl:  Lab Results  Component Value Date   EGFR 36 (L) 10/15/2021    No components found for: CRCL   High Risk for Neuropathy: (Status:  Condition stable.  Not addressed this visit.)  Long Term Goal Return phone call to patient to discuss symptoms related to abnormal sensation to her legs  Evaluation of current treatment plan related to  abnormal sensation to bilateral legs , self-management and patient's adherence to plan as established by provider Determined patient is experiencing a "crawling sensation" to both upper legs from her knees to her inner thighs, patient denies pain or cramping, PCP was notified during last PCP visit  Reviewed and discussed next scheduled appointment with Cardiology is scheduled for 10/15/21 _0 :25 AM with Dr. Kathlen Mody Discussed patient plans to discuss these symptoms with Cardiology during this visit, she denies having additional questions at this time  Discussed plans with patient for ongoing care management follow up and provided patient with direct contact information for care management team   Patient Goals/Self-Care Activities: Take all  medications as prescribed Attend all scheduled provider appointments Call pharmacy for medication refills 3-7 days in advance of running out of medications Perform all self care activities independently  Call provider office for new concerns or questions  drink 6 to 8 glasses of water each day manage portion size take medications for blood pressure exactly as prescribed  Follow Up Plan:  Telephone follow up appointment with care management team member scheduled for:  01/20/22     Barb Merino, RN, BSN, CCM Care Management Coordinator [place] [facility]  Direct Phone: [phone]

## 2021-11-21 NOTE — Patient Instructions (Signed)
Visit Information ? ?Thank you for taking time to visit with me today. Please don't hesitate to contact me if I can be of assistance to you before our next scheduled telephone appointment. ? ?Following are the goals we discussed today:  ?(Copy and paste patient goals from clinical care plan here) ? ?Our next appointment is by telephone on 01/20/22 at 12 PM ? ?Please call the care guide team at [phone] if you need to cancel or reschedule your appointment.  ? ?If you are experiencing a Mental Health or [place] or need someone to talk to, please call [phone]-TALK (toll free, 24 hour hotline)  ? ?Patient verbalizes understanding of instructions and care plan provided today and agrees to view in [place]. Active MyChart status confirmed with patient.   ? ?Barb Merino, RN, BSN, CCM ?Care Management Coordinator ?[facility]  ?Direct Phone: [phone] ? ? ?

## 2021-12-03 ENCOUNTER — Telehealth: Payer: Medicare Other

## 2021-12-13 DIAGNOSIS — E1122 Type 2 diabetes mellitus with diabetic chronic kidney disease: Secondary | ICD-10-CM | POA: Diagnosis not present

## 2021-12-13 DIAGNOSIS — N1832 Chronic kidney disease, stage 3b: Secondary | ICD-10-CM | POA: Diagnosis not present

## 2021-12-13 DIAGNOSIS — I129 Hypertensive chronic kidney disease with stage 1 through stage 4 chronic kidney disease, or unspecified chronic kidney disease: Secondary | ICD-10-CM | POA: Diagnosis not present

## 2021-12-14 ENCOUNTER — Other Ambulatory Visit: Payer: Self-pay | Admitting: Physician Assistant

## 2021-12-16 ENCOUNTER — Other Ambulatory Visit: Payer: Self-pay | Admitting: *Deleted

## 2021-12-16 MED ORDER — NEBIVOLOL HCL 2.5 MG PO TABS: 2.5000 mg | ORAL_TABLET | Freq: Every day | ORAL | 1 refills | Status: DC

## 2021-12-16 MED ORDER — EMPAGLIFLOZIN 10 MG PO TABS: 10.0000 mg | ORAL_TABLET | Freq: Every day | ORAL | 1 refills | Status: DC

## 2021-12-17 DIAGNOSIS — H401131 Primary open-angle glaucoma, bilateral, mild stage: Secondary | ICD-10-CM | POA: Diagnosis not present

## 2021-12-23 ENCOUNTER — Telehealth: Payer: Self-pay

## 2021-12-23 NOTE — Chronic Care Management (AMB) (Signed)
? ? ?Chronic Care Management ?Pharmacy Assistant  ? ?Name: Sarah Weaver  MRN: [id number] DOB: Jul 30, 1936 ? ?Reason for Encounter: Disease State/ Diabetes ? ?Recent office visits:  ?11-20-2021 Little, Claudette Stapler, RN (CCM) ? ?11-18-2021 [name], [place]. A1C= 7.4. INCREASE Tresiba 10 units daily TO 15 units daily. INCREASE Ozempic 0.25 weekly TO 0.5 mg weekly. ? ?10-17-2021 Little, Claudette Stapler, RN (CCM) ? ?Recent consult visits:  ?10-15-2021 Sharmon Revere (Cardiology). Glucose= 193, Creatinine= 1.44, eGFR= 36. STOP amlodipine. ? ?Hospital visits:  ?None in previous 6 months ? ?Medications: ?Outpatient Encounter Medications as of 12/23/2021  ?Medication Sig  ? Accu-Chek Softclix Lancets lancets USE UP TO 4 TIMES DAILY AS DIRECTED  ? albuterol (VENTOLIN HFA) 108 (90 Base) MCG/ACT inhaler Inhale 2 puffs into the lungs every 6 (six) hours as needed for wheezing or shortness of breath.  ? aspirin EC 81 MG tablet Take 81 mg by mouth daily.  ? atorvastatin (LIPITOR) 80 MG tablet Take 1 tablet by mouth at bedtime  ? benzonatate (TESSALON PERLES) 100 MG capsule Take 1 capsule (100 mg total) by mouth 3 (three) times daily as needed for cough.  ? blood glucose meter kit and supplies KIT Dispense based on patient and insurance preference. Use up to four times daily as directed. (FOR ICD-9 250.00, 250.01).  ? brinzolamide (AZOPT) 1 % ophthalmic suspension Place 1 drop into both eyes 3 (three) times daily.  ? Cholecalciferol 125 MCG (5000 UT) TABS Take 1 tablet by mouth daily.  ? empagliflozin (JARDIANCE) 10 MG TABS tablet Take 1 tablet (10 mg total) by mouth daily before breakfast.  ? ezetimibe (ZETIA) 10 MG tablet TAKE 1 TABLET BY MOUTH EVERY DAY  ? furosemide (LASIX) 40 MG tablet Take 1 tablet (40 mg total) by mouth daily.  ? insulin degludec (TRESIBA FLEXTOUCH) 100 UNIT/ML FlexTouch Pen Inject 15 Units into the skin daily.  ? Insulin Pen Needle (BD PEN NEEDLE NANO U/F) 32G X 4 MM MISC USE NIGHTLY AS DIRECTED  ?  Latanoprostene Bunod 0.024 % SOLN Apply 1 drop to eye daily.  ? levothyroxine (SYNTHROID) 50 MCG tablet TAKE 1 TABLET BY MOUTH EVERY DAY BEFORE BREAKFAST  ? LINZESS 145 MCG CAPS capsule TAKE 1 CAPSULE (145 MCG TOTAL) BY MOUTH DAILY BEFORE BREAKFAST. FOR CONSTIPATION  ? losartan (COZAAR) 50 MG tablet Take 50 mg by mouth at bedtime.  ? Magnesium 250 MG TABS Take 1 tablet (250 mg total) by mouth every evening. Take with evening meal  ? nebivolol (BYSTOLIC) 2.5 MG tablet Take 1 tablet (2.5 mg total) by mouth daily.  ? nystatin (NYSTATIN) powder Apply 1 application topically 3 (three) times daily.  ? ONETOUCH VERIO test strip CHECK BLOOD SUGARS ONCE DAILY  ? Semaglutide,0.25 or 0.5MG/DOS, (OZEMPIC, 0.25 OR 0.5 MG/DOSE,) 2 MG/1.5ML SOPN Inject 0.5 mg into the skin once a week.  ? traMADol-acetaminophen (ULTRACET) 37.5-325 MG tablet TAKE 1 TABLET BY MOUTH EVERY 6 HOURS AS NEEDED FOR MODERATE BACK PAIN  ? ?No facility-administered encounter medications on file as of 12/23/2021.  ?Recent Relevant Labs: ?Lab Results  ?Component Value Date/Time  ? HGBA1C 7.0 (H) 11/18/2021 04:22 PM  ? HGBA1C 7.2 (H) 06/26/2021 05:06 PM  ?  ?Kidney Function ?Lab Results  ?Component Value Date/Time  ? CREATININE 1.44 (H) 10/15/2021 09:20 AM  ? CREATININE 1.23 (H) 06/26/2021 05:06 PM  ? CREATININE 1.41 (H) 01/18/2016 08:22 AM  ? CREATININE 2.62 (H) 12/28/2015 10:27 AM  ? GFRNONAA 30 (L) 11/06/2020 12:02 PM  ?  GFRAA 34 (L) 11/06/2020 12:02 PM  ? ? ?Current antihyperglycemic regimen:  ?Jardiance 10 mg tablet once per day ?Tyler Aas - injecting 15 units daily ?Ozempic 0.5 mg weekly ? ?What recent interventions/DTPs have been made to improve glycemic control:  ?Educated on A1c and blood sugar goals; ?Complications of diabetes including kidney damage, retinal damage, and cardiovascular disease; ?-Counseled to check feet daily and get yearly eye exams ?-Counseled on diet and exercise extensively ?Recommended to continue current medication ? ?Have there  been any recent hospitalizations or ED visits since last visit with CPP? No ? ?Patient denies hypoglycemic symptoms ? ?Patient denies hyperglycemic symptoms ? ?How often are you checking your blood sugar? Twice weekly ? ?What are your blood sugars ranging?  ?Fasting: 132, 130 ?Before meals: None ?After meals: None ?Bedtime: None ? ?During the week, how often does your blood glucose drop below 70? Never ? ?Are you checking your feet daily/regularly? Daily ? ?Adherence Review: ?Is the patient currently on a STATIN medication? No ?Is the patient currently on ACE/ARB medication? Yes ?Does the patient have >5 day gap between last estimated fill dates? No ? ? ?Care Gaps: ?Covid booster overdue ?AWV 08-06-2022 ? ?Star Rating Drugs: ?Ozempic 0.5 mg- Last filled 11-09-2021 [facility] ?Losartan 50 mg- Last filled 09-09-2021 [facility] ([facility]. Patient informed) ?Jardiance 10 mg- Last filled [facility]-15-2021 [facility] ?Atorvastatin 80 mg- Last filled 11-03-2021 [facility] ? ?Malecca Hicks CMA ?Clinical Pharmacist Assistant ?[phone] ? ?

## 2021-12-26 ENCOUNTER — Emergency Department (HOSPITAL_BASED_OUTPATIENT_CLINIC_OR_DEPARTMENT_OTHER): Payer: Medicare Other

## 2021-12-26 ENCOUNTER — Encounter (HOSPITAL_BASED_OUTPATIENT_CLINIC_OR_DEPARTMENT_OTHER): Payer: Self-pay | Admitting: Emergency Medicine

## 2021-12-26 ENCOUNTER — Other Ambulatory Visit: Payer: Self-pay

## 2021-12-26 ENCOUNTER — Emergency Department (HOSPITAL_BASED_OUTPATIENT_CLINIC_OR_DEPARTMENT_OTHER)
Admission: EM | Admit: 2021-12-26 | Discharge: 2021-12-26 | Disposition: A | Discharge status: Home / Self Care | Payer: Medicare Other | Attending: Emergency Medicine | Admitting: Emergency Medicine

## 2021-12-26 ENCOUNTER — Emergency Department (HOSPITAL_BASED_OUTPATIENT_CLINIC_OR_DEPARTMENT_OTHER): Payer: Medicare Other | Admitting: Radiology

## 2021-12-26 DIAGNOSIS — M545 Low back pain, unspecified: Secondary | ICD-10-CM | POA: Diagnosis not present

## 2021-12-26 DIAGNOSIS — Y92009 Unspecified place in unspecified non-institutional (private) residence as the place of occurrence of the external cause: Secondary | ICD-10-CM | POA: Insufficient documentation

## 2021-12-26 DIAGNOSIS — W19XXXA Unspecified fall, initial encounter: Secondary | ICD-10-CM

## 2021-12-26 DIAGNOSIS — S8001XA Contusion of right knee, initial encounter: Secondary | ICD-10-CM | POA: Diagnosis not present

## 2021-12-26 DIAGNOSIS — W01198A Fall on same level from slipping, tripping and stumbling with subsequent striking against other object, initial encounter: Secondary | ICD-10-CM | POA: Insufficient documentation

## 2021-12-26 DIAGNOSIS — M1611 Unilateral primary osteoarthritis, right hip: Secondary | ICD-10-CM | POA: Diagnosis not present

## 2021-12-26 DIAGNOSIS — M1711 Unilateral primary osteoarthritis, right knee: Secondary | ICD-10-CM | POA: Diagnosis not present

## 2021-12-26 DIAGNOSIS — M25551 Pain in right hip: Secondary | ICD-10-CM | POA: Insufficient documentation

## 2021-12-26 DIAGNOSIS — Z794 Long term (current) use of insulin: Secondary | ICD-10-CM | POA: Diagnosis not present

## 2021-12-26 DIAGNOSIS — I2510 Atherosclerotic heart disease of native coronary artery without angina pectoris: Secondary | ICD-10-CM | POA: Diagnosis not present

## 2021-12-26 DIAGNOSIS — I10 Essential (primary) hypertension: Secondary | ICD-10-CM | POA: Insufficient documentation

## 2021-12-26 DIAGNOSIS — M23342 Other meniscus derangements, anterior horn of lateral meniscus, left knee: Secondary | ICD-10-CM | POA: Diagnosis not present

## 2021-12-26 DIAGNOSIS — Z7982 Long term (current) use of aspirin: Secondary | ICD-10-CM | POA: Insufficient documentation

## 2021-12-26 DIAGNOSIS — Y9301 Activity, walking, marching and hiking: Secondary | ICD-10-CM | POA: Insufficient documentation

## 2021-12-26 DIAGNOSIS — S0003XA Contusion of scalp, initial encounter: Secondary | ICD-10-CM | POA: Insufficient documentation

## 2021-12-26 DIAGNOSIS — M542 Cervicalgia: Secondary | ICD-10-CM | POA: Insufficient documentation

## 2021-12-26 DIAGNOSIS — M5136 Other intervertebral disc degeneration, lumbar region: Secondary | ICD-10-CM | POA: Diagnosis not present

## 2021-12-26 DIAGNOSIS — M47816 Spondylosis without myelopathy or radiculopathy, lumbar region: Secondary | ICD-10-CM | POA: Diagnosis not present

## 2021-12-26 DIAGNOSIS — Z79899 Other long term (current) drug therapy: Secondary | ICD-10-CM | POA: Diagnosis not present

## 2021-12-26 DIAGNOSIS — R519 Headache, unspecified: Secondary | ICD-10-CM | POA: Diagnosis not present

## 2021-12-26 DIAGNOSIS — S8991XA Unspecified injury of right lower leg, initial encounter: Secondary | ICD-10-CM | POA: Diagnosis present

## 2021-12-26 LAB — CBG MONITORING, ED: Glucose-Capillary: 161 mg/dL — ABNORMAL HIGH (ref 70–99)

## 2021-12-26 MED ORDER — TRAMADOL HCL 50 MG PO TABS
50.0000 mg | ORAL_TABLET | Freq: Four times a day (QID) | ORAL | 0 refills | Status: DC | PRN
Start: 2021-12-26 — End: 2022-01-20

## 2021-12-26 MED ORDER — OXYCODONE-ACETAMINOPHEN 5-325 MG PO TABS
2.0000 | ORAL_TABLET | Freq: Once | ORAL | Status: AC
  Administered 2021-12-26: 2 via ORAL
  Filled 2021-12-26: qty 2

## 2021-12-26 NOTE — ED Provider Notes (Signed)
?[facility] ?Provider Note ? ? ?CSN: [id number] ?Arrival date & time: 12/26/21  1825 ? ?  ? ?History ? ?Chief Complaint  ?Patient presents with  ? Fall  ? ? ?Sarah Weaver is a 86 y.o. female history of hypertension, CAD here presenting with fall.  Patient was walking at home and states that she walked too fast and leg gave out and tripped and fell and landed on her back and her right knee.  Patient has chronic back pain that got worse.  Patient did hit her head as well.  Patient is not currently on any blood thinners ? ?The history is provided by the patient.  ? ?  ? ?Home Medications ?Prior to Admission medications   ?Medication Sig Start Date End Date Taking? Authorizing Provider  ?Accu-Chek Softclix Lancets lancets USE UP TO 4 TIMES DAILY AS DIRECTED 01/14/21   [name], FNP  ?albuterol (VENTOLIN HFA) 108 (90 Base) MCG/ACT inhaler Inhale 2 puffs into the lungs every 6 (six) hours as needed for wheezing or shortness of breath. 02/20/21   Bary Castilla, NP  ?aspirin EC 81 MG tablet Take 81 mg by mouth daily.    [provider]  ?atorvastatin (LIPITOR) 80 MG tablet Take 1 tablet by mouth at bedtime 03/19/21   [name], FNP  ?benzonatate (TESSALON PERLES) 100 MG capsule Take 1 capsule (100 mg total) by mouth 3 (three) times daily as needed for cough. 02/20/21 02/20/22  Bary Castilla, NP  ?blood glucose meter kit and supplies KIT Dispense based on patient and insurance preference. Use up to four times daily as directed. (FOR ICD-9 250.00, 250.01). 11/06/20   [name], FNP  ?brinzolamide (AZOPT) 1 % ophthalmic suspension Place 1 drop into both eyes 3 (three) times daily.    [provider]  ?Cholecalciferol 125 MCG (5000 UT) TABS Take 1 tablet by mouth daily.    [provider]  ?empagliflozin (JARDIANCE) 10 MG TABS tablet Take 1 tablet (10 mg total) by mouth daily before breakfast. 12/16/21   Freada Bergeron, MD  ?ezetimibe (ZETIA)  10 MG tablet TAKE 1 TABLET BY MOUTH EVERY DAY 12/16/21   Freada Bergeron, MD  ?furosemide (LASIX) 40 MG tablet Take 1 tablet (40 mg total) by mouth daily. 05/13/21   Freada Bergeron, MD  ?insulin degludec (TRESIBA FLEXTOUCH) 100 UNIT/ML FlexTouch Pen Inject 15 Units into the skin daily. 11/18/21   [name], [place]  ?Insulin Pen Needle (BD PEN NEEDLE NANO U/F) 32G X 4 MM MISC USE NIGHTLY AS DIRECTED 02/26/21   [name], FNP  ?Latanoprostene Bunod 0.024 % SOLN Apply 1 drop to eye daily.    [provider]  ?levothyroxine (SYNTHROID) 50 MCG tablet TAKE 1 TABLET BY MOUTH EVERY DAY BEFORE BREAKFAST 11/04/21   [name], [place]  ?LINZESS 145 MCG CAPS capsule TAKE 1 CAPSULE (145 MCG TOTAL) BY MOUTH DAILY BEFORE BREAKFAST. FOR CONSTIPATION 06/17/21   [name], FNP  ?losartan (COZAAR) 50 MG tablet Take 50 mg by mouth at bedtime.    Rexene Agent, MD  ?Magnesium 250 MG TABS Take 1 tablet (250 mg total) by mouth every evening. Take with evening meal 11/06/20   [name], FNP  ?nebivolol (BYSTOLIC) 2.5 MG tablet Take 1 tablet (2.5 mg total) by mouth daily. 12/16/21   Freada Bergeron, MD  ?nystatin (NYSTATIN) powder Apply 1 application topically 3 (three) times daily. 07/11/20   [name], FNP  ?ONETOUCH VERIO test strip CHECK BLOOD  SUGARS ONCE DAILY 05/02/19   Rodriguez-South[place], Sunday Spillers, PA-C  ?Semaglutide,0.25 or 0.5MG/DOS, (OZEMPIC, 0.25 OR 0.5 MG/DOSE,) 2 MG/1.5ML SOPN Inject 0.5 mg into the skin once a week. 11/18/21   [name], [place]  ?traMADol-acetaminophen (ULTRACET) 37.5-325 MG tablet TAKE 1 TABLET BY MOUTH EVERY 6 HOURS AS NEEDED FOR MODERATE BACK PAIN 07/16/21   [name], FNP  ?   ? ?Allergies    ?Carvedilol and Lisinopril   ? ?Review of Systems   ?Review of Systems  ?Musculoskeletal:  Positive for back pain.  ?     R knee pain   ?All other systems reviewed and are negative. ? ?Physical Exam ?Updated Vital Signs ?BP (!) 145/68   Pulse 72   Temp 98.7 ?F (37.1 ?C)   Resp 16    Ht 5' 4"  (1.626 m)   Wt 135.2 kg   SpO2 95%   BMI 51.15 kg/m?  ?Physical Exam ?Vitals and nursing note reviewed.  ?Constitutional:   ?   Comments: Uncomfortable  ?HENT:  ?   Head: Normocephalic.  ?   Comments: Small posterior scalp hematoma ?   Mouth/Throat:  ?   Mouth: Mucous membranes are moist.  ?Eyes:  ?   Extraocular Movements: Extraocular movements intact.  ?   Pupils: Pupils are equal, round, and reactive to light.  ?Neck:  ?   Comments: Mild paracervical tenderness ?Cardiovascular:  ?   Rate and Rhythm: Normal rate and regular rhythm.  ?   Pulses: Normal pulses.  ?   Heart sounds: Normal heart sounds.  ?Pulmonary:  ?   Effort: Pulmonary effort is normal.  ?   Breath sounds: Normal breath sounds.  ?Abdominal:  ?   General: Abdomen is flat.  ?   Palpations: Abdomen is soft.  ?Musculoskeletal:  ?   Cervical back: Normal range of motion and neck supple.  ?   Comments: Patient has mild lower lumbar tenderness.  Also bruising of the right knee but able to range the knee.  Mild right hip tenderness but able to range the hip  ?Skin: ?   General: Skin is warm.  ?Neurological:  ?   General: No focal deficit present.  ?   Mental Status: She is oriented to person, place, and time.  ?Psychiatric:     ?   Mood and Affect: Mood normal.     ?   Behavior: Behavior normal.  ? ? ?ED Results / Procedures / Treatments   ?Labs ?(all labs ordered are listed, but only abnormal results are displayed) ?Labs Reviewed  ?CBG MONITORING, ED - Abnormal; Notable for the following components:  ?    Result Value  ? Glucose-Capillary 161 (*)   ? All other components within normal limits  ? ? ?EKG ?None ? ?Radiology ?CT Head Wo Contrast ? ?Result Date: 12/26/2021 ?CLINICAL DATA:  Recent fall with headaches and neck pain, initial encounter EXAM: CT HEAD WITHOUT CONTRAST CT CERVICAL SPINE WITHOUT CONTRAST TECHNIQUE: Multidetector CT imaging of the head and cervical spine was performed following the standard protocol without intravenous  contrast. Multiplanar CT image reconstructions of the cervical spine were also generated. RADIATION DOSE REDUCTION: This exam was performed according to the departmental dose-optimization program which includes automated exposure control, adjustment of the mA and/or kV according to patient size and/or use of iterative reconstruction technique. COMPARISON:  None. FINDINGS: CT HEAD FINDINGS Brain: No evidence of acute infarction, hemorrhage, hydrocephalus, extra-axial collection or mass lesion/mass effect. Lacunar infarct is noted in the head of  the caudate nucleus on the left. No acute hemorrhage or acute infarction is seen. Vascular: No hyperdense vessel or unexpected calcification. Skull: Normal. Negative for fracture or focal lesion. Sinuses/Orbits: No acute finding. Other: None. CT CERVICAL SPINE FINDINGS Alignment: Loss of the normal cervical lordosis which may be related to muscular spasm. Skull base and vertebrae: 7 cervical segments are well visualized. Mild osteophytic changes are seen. Mild facet hypertrophic changes are noted. No acute fracture or acute facet abnormality is seen. The odontoid is within normal limits. Soft tissues and spinal canal: Surrounding soft tissue structures show atherosclerotic calcifications of the arterial structures. No acute hematoma is noted. Upper chest: Visualized lung apices are unremarkable. Other: None IMPRESSION: CT of the head: No acute intracranial abnormality noted. Old lacunar infarct in the left caudate nucleus. CT of the cervical spine: Degenerative changes without acute abnormality. Electronically Signed   By: Inez Catalina M.D.   On: 12/26/2021 21:41  ? ?CT Cervical Spine Wo Contrast ? ?Result Date: 12/26/2021 ?CLINICAL DATA:  Recent fall with headaches and neck pain, initial encounter EXAM: CT HEAD WITHOUT CONTRAST CT CERVICAL SPINE WITHOUT CONTRAST TECHNIQUE: Multidetector CT imaging of the head and cervical spine was performed following the standard protocol  without intravenous contrast. Multiplanar CT image reconstructions of the cervical spine were also generated. RADIATION DOSE REDUCTION: This exam was performed according to the departmental dose-optimization program

## 2021-12-26 NOTE — ED Notes (Signed)
This nurse has verbally reinforced d/c instructions and provided pt with written copy- pt acknowledges verbal understanding of d/c instructions and provided pt with written copy- pt and family deny any additional questions, concerns, needs at discharge  ?

## 2021-12-26 NOTE — ED Notes (Signed)
Patient transported to CT at this time.  Prior to transport pt GCS 15 - verbalizes acute R knee pain rated 8/10 described as throbbing with acute on chronic lower back pain also rated 8/10 -- medicated with 2 percocets.  Daughter at bedside reports RLE now more edematous in comparison to LLE - RLE distal neurovascular status intact.  ?

## 2021-12-26 NOTE — ED Notes (Signed)
Late entry--- Pt has returned from imaging via stretcher- remains awake and alert lying quietly in bed showing no signs of acute distress.  Pt reports pain improvement to L knee and lower back since PO pain med administration (see MAR) - pain level now 6/10.  Continues to await test results - denies any immediate needs, questions, concerns at this time- family remains at bedside.   ?

## 2021-12-26 NOTE — ED Triage Notes (Addendum)
Pt from home with family had a witnessed fall at approx 1500. Pt's left knee gave out and Pt fell backward and hit the back of her head on carpeted floor. Pt is NOT on thinners. Pt denis LOC, N/V or vision issues. Pt states that her right knee also hurts.  ?

## 2021-12-26 NOTE — Discharge Instructions (Signed)
Your CT scan and x-rays did not show any fracture. ? ?Please use your walker to help you walk ? ?Take Tylenol for pain and tramadol for severe pain ? ?See your doctor for follow up ? ?Return to ER if you have worse pain, Unable to walk, vomiting.  ? ?

## 2021-12-29 ENCOUNTER — Other Ambulatory Visit: Payer: Self-pay | Admitting: Nurse Practitioner

## 2021-12-29 DIAGNOSIS — E782 Mixed hyperlipidemia: Secondary | ICD-10-CM

## 2021-12-30 ENCOUNTER — Telehealth: Payer: Self-pay

## 2021-12-30 NOTE — Telephone Encounter (Signed)
Called patient after visit to ED on 12/26/2021 for fall. Pt states she feels fine, the ED met all her needs and she does not need an appointment at this time. Pt assured, to give the office a call back with any questions or concerns.  ?

## 2022-01-20 ENCOUNTER — Other Ambulatory Visit: Payer: Self-pay | Admitting: Nurse Practitioner

## 2022-01-20 ENCOUNTER — Ambulatory Visit (INDEPENDENT_AMBULATORY_CARE_PROVIDER_SITE_OTHER): Payer: Medicare Other

## 2022-01-20 ENCOUNTER — Telehealth: Payer: Medicare Other

## 2022-01-20 DIAGNOSIS — I10 Essential (primary) hypertension: Secondary | ICD-10-CM

## 2022-01-20 DIAGNOSIS — N1831 Chronic kidney disease, stage 3a: Secondary | ICD-10-CM

## 2022-01-20 DIAGNOSIS — E782 Mixed hyperlipidemia: Secondary | ICD-10-CM

## 2022-01-20 DIAGNOSIS — E1122 Type 2 diabetes mellitus with diabetic chronic kidney disease: Secondary | ICD-10-CM

## 2022-01-20 DIAGNOSIS — M545 Low back pain, unspecified: Secondary | ICD-10-CM

## 2022-01-20 MED ORDER — TRAMADOL-ACETAMINOPHEN 37.5-325 MG PO TABS: ORAL_TABLET | ORAL | 0 refills | Status: DC

## 2022-01-20 NOTE — Chronic Care Management (AMB) (Signed)
?Chronic Care Management  ? ?CCM RN Visit Note ? ?01/20/2022 ?Name: Sarah Weaver MRN: [id number] DOB: 21-Aug-1936 ? ?Subjective: ?Sarah Weaver is a 86 y.o. year old female who is a primary care patient of [name], [place]. The care management team was consulted for assistance with disease management and care coordination needs.   ? ?Engaged with patient by telephone for follow up visit in response to provider referral for case management and/or care coordination services.  ? ?Consent to Services:  ?The patient was given information about Chronic Care Management services, agreed to services, and gave verbal consent prior to initiation of services.  Please see initial visit note for detailed documentation.  ? ?Patient agreed to services and verbal consent obtained.  ? ?Assessment: Review of patient past medical history, allergies, medications, health status, including review of consultants reports, laboratory and other test data, was performed as part of comprehensive evaluation and provision of chronic care management services.  ? ?SDOH (Social Determinants of Health) assessments and interventions performed:  Yes, no acute needs  ? ?CCM Care Plan ? ?Allergies  ?Allergen Reactions  ? Carvedilol Cough  ?  Pt reports causes her to cough  ? Lisinopril Cough  ?  REPORTS CAUSES DRY COUGH  ? ? ?Outpatient Encounter Medications as of 01/20/2022  ?Medication Sig  ? albuterol (VENTOLIN HFA) 108 (90 Base) MCG/ACT inhaler Inhale 2 puffs into the lungs every 6 (six) hours as needed for wheezing or shortness of breath.  ? aspirin EC 81 MG tablet Take 81 mg by mouth daily.  ? atorvastatin (LIPITOR) 80 MG tablet TAKE 1 TABLET BY MOUTH EVERYDAY AT BEDTIME  ? Cholecalciferol 125 MCG (5000 UT) TABS Take 1 tablet by mouth daily.  ? empagliflozin (JARDIANCE) 10 MG TABS tablet Take 1 tablet (10 mg total) by mouth daily before breakfast.  ? ezetimibe (ZETIA) 10 MG tablet TAKE 1 TABLET BY MOUTH EVERY DAY  ?  furosemide (LASIX) 40 MG tablet Take 1 tablet (40 mg total) by mouth daily.  ? insulin degludec (TRESIBA FLEXTOUCH) 100 UNIT/ML FlexTouch Pen Inject 15 Units into the skin daily.  ? levothyroxine (SYNTHROID) 50 MCG tablet TAKE 1 TABLET BY MOUTH EVERY DAY BEFORE BREAKFAST  ? LINZESS 145 MCG CAPS capsule TAKE 1 CAPSULE (145 MCG TOTAL) BY MOUTH DAILY BEFORE BREAKFAST. FOR CONSTIPATION (Patient taking differently: Patient is taking before breakfast every couple of days)  ? losartan (COZAAR) 50 MG tablet Take 50 mg by mouth at bedtime.  ? nebivolol (BYSTOLIC) 2.5 MG tablet Take 1 tablet (2.5 mg total) by mouth daily.  ? Semaglutide,0.25 or 0.5MG/DOS, (OZEMPIC, 0.25 OR 0.5 MG/DOSE,) 2 MG/1.5ML SOPN Inject 0.5 mg into the skin once a week.  ? timolol (BETIMOL) 0.5 % ophthalmic solution 1 drop 2 (two) times daily.  ? [DISCONTINUED] traMADol-acetaminophen (ULTRACET) 37.5-325 MG tablet TAKE 1 TABLET BY MOUTH EVERY 6 HOURS AS NEEDED FOR MODERATE BACK PAIN  ? Accu-Chek Softclix Lancets lancets USE UP TO 4 TIMES DAILY AS DIRECTED  ? benzonatate (TESSALON PERLES) 100 MG capsule Take 1 capsule (100 mg total) by mouth 3 (three) times daily as needed for cough. (Patient not taking: Reported on 01/20/2022)  ? blood glucose meter kit and supplies KIT Dispense based on patient and insurance preference. Use up to four times daily as directed. (FOR ICD-9 250.00, 250.01).  ? brinzolamide (AZOPT) 1 % ophthalmic suspension Place 1 drop into both eyes 3 (three) times daily. (Patient not taking: Reported on 01/20/2022)  ? Insulin Pen Needle (  BD PEN NEEDLE NANO U/F) 32G X 4 MM MISC USE NIGHTLY AS DIRECTED  ? Latanoprostene Bunod 0.024 % SOLN Apply 1 drop to eye daily.  ? Magnesium 250 MG TABS Take 1 tablet (250 mg total) by mouth every evening. Take with evening meal (Patient not taking: Reported on 01/20/2022)  ? nystatin (NYSTATIN) powder Apply 1 application topically 3 (three) times daily.  ? ONETOUCH VERIO test strip CHECK BLOOD SUGARS ONCE  DAILY  ? [DISCONTINUED] traMADol (ULTRAM) 50 MG tablet Take 1 tablet (50 mg total) by mouth every 6 (six) hours as needed. (Patient not taking: Reported on 01/20/2022)  ? ?No facility-administered encounter medications on file as of 01/20/2022.  ? ? ?Patient Active Problem List  ? Diagnosis Date Noted  ? [place] (type) I [facility]) atrioventricular block 10/15/2021  ? Heart palpitations 10/14/2021  ? Class 3 severe obesity due to excess calories with serious comorbidity in adult [facility]) 08/25/2019  ? Diabetes mellitus due to underlying condition with stage 3a chronic kidney disease, with long-term current use of insulin ([place]) 06/16/2019  ? CAD (coronary artery disease) 07/26/2018  ? (HFpEF) heart failure with preserved ejection fraction ([place]) 07/26/2018  ? Chronic kidney disease (CKD), stage III (moderate) ([place]) 07/26/2018  ? Chest pain, moderate coronary artery risk 12/20/2015  ? DM2 (diabetes mellitus, type 2) ([place]) 12/20/2015  ? HTN (hypertension) 12/20/2015  ? HLD (hyperlipidemia) 12/20/2015  ? Ischemic chest pain ([place]) 12/20/2015  ? Hx of NSTEMI in 2017   ? Acute renal failure ([place])   ? Cholelithiasis 04/14/2011  ? ? ?Conditions to be addressed/monitored: HTN, HLD, DM II, Stage 3a Chronic Kidney disease ? ?Care Plan : RN Care Manager Plan of Care  ?Updates made by Lynne Logan, RN since 01/20/2022 12:00 AM  ?  ? ?Problem: No plan established for management of chronic disease states (HTN, HLD, DM II, Stage 3a Chronic Kidney disease)   ?Priority: High  ?  ? ?Long-Range Goal: Establish plan of care for management of chronic disease states (HTN, HLD, DM II, Stage 3a Chronic Kidney disease)   ?Start Date: 07/22/2021  ?Expected End Date: 07/22/2022  ?Recent Progress: On track  ?Priority: High  ?Note:   ?Current Barriers:  ?Knowledge Deficits related to plan of care for management of HTN, HLD, DM II, Stage 3a Chronic Kidney disease ?Chronic Disease Management support and education needs related to HTN, HLD, DM II, Stage  3a Chronic Kidney disease ? ?RNCM Clinical Goal(s):  ?Patient will verbalize basic understanding of  HTN, HLD, DM II, Stage 3a Chronic Kidney disease disease process and self health management plan   ?demonstrate Improved health management independence   ?continue to work with RN Care Manager to address care management and care coordination needs related to  HTN, HLD, DM II, Stage 3a Chronic Kidney disease ?will demonstrate ongoing self health care management ability    through collaboration with [place], provider, and care team.  ? ?Interventions: ?1:1 collaboration with primary care provider regarding development and update of comprehensive plan of care as evidenced by provider attestation and co-signature ?Inter-disciplinary care team collaboration (see longitudinal plan of care) ?Evaluation of current treatment plan related to  self management and patient's adherence to plan as established by provider ? ? ?Hypertension Interventions:  (Status:  Condition stable.  Not addressed this visit.) Long Term Goal ?Last practice recorded BP readings:  ?BP Readings from Last 3 Encounters:  ?11/18/21 130/74  ?10/15/21 140/80  ?08/20/21 122/80  ?Most recent eGFR/CrCl:  ?Lab  Results  ?Component Value Date  ? EGFR 36 (L) 10/15/2021  ?  No components found for: CRCL ?Evaluation of current treatment plan related to hypertension self management and patient's adherence to plan as established by provider ?Reviewed medications with patient and discussed importance of compliance ?Counseled on the importance of exercise goals with target of 150 minutes per week ?Advised patient, providing education and rationale, to monitor blood pressure daily and record, calling PCP for findings outside established parameters ?Provided education on prescribed diet low Sodium  ? ? ?Diabetes Interventions:  (Status:  Goal on track:  Yes.) Long Term Goal ?Assessed patient's understanding of A1c goal: <7% ?Provided education to patient about  basic DM disease process ?Reviewed medications with patient and discussed importance of medication adherence ?Counseled on importance of regular laboratory monitoring as prescribed ?Advised patient, providing educat

## 2022-01-20 NOTE — Patient Instructions (Signed)
Visit Information ? ?Thank you for taking time to visit with me today. Please don't hesitate to contact me if I can be of assistance to you before our next scheduled telephone appointment. ? ?Following are the goals we discussed today:  ?(Copy and paste patient goals from clinical care plan here) ? ?Our next appointment is by telephone on 03/26/22 at 10:30 AM ? ?Please call the care guide team at [phone] if you need to cancel or reschedule your appointment.  ? ?If you are experiencing a Mental Health or Behavioral Health Crisis or need someone to talk to, please call 1-800-273-TALK (toll free, 24 hour hotline)  ? ?Patient verbalizes understanding of instructions and care plan provided today and agrees to view in MyChart. Active MyChart status confirmed with patient.   ? ?Delsa Sale, RN, BSN, CCM ?Care Management Coordinator ?[facility] Care Management/[facility]  ?Direct Phone: [phone] ? ? ?

## 2022-02-12 DIAGNOSIS — E1122 Type 2 diabetes mellitus with diabetic chronic kidney disease: Secondary | ICD-10-CM

## 2022-02-12 DIAGNOSIS — Z794 Long term (current) use of insulin: Secondary | ICD-10-CM | POA: Diagnosis not present

## 2022-02-12 DIAGNOSIS — E785 Hyperlipidemia, unspecified: Secondary | ICD-10-CM

## 2022-02-12 DIAGNOSIS — I129 Hypertensive chronic kidney disease with stage 1 through stage 4 chronic kidney disease, or unspecified chronic kidney disease: Secondary | ICD-10-CM | POA: Diagnosis not present

## 2022-02-12 DIAGNOSIS — N1831 Chronic kidney disease, stage 3a: Secondary | ICD-10-CM

## 2022-02-15 ENCOUNTER — Other Ambulatory Visit: Payer: Self-pay | Admitting: Nurse Practitioner

## 2022-02-17 ENCOUNTER — Telehealth: Payer: Self-pay

## 2022-02-17 NOTE — Chronic Care Management (AMB) (Signed)
Chronic Care Management Pharmacy Assistant   Name: Sarah Weaver  MRN: [id number] DOB: 1936/03/08  Reason for Encounter: Disease State/ Hypertension  Recent office visits:  01-20-2022 Lynne Logan ,RN (CCM)  Recent consult visits:  None  Hospital visits:  Medication Reconciliation was completed by comparing discharge summary, patient's EMR and Pharmacy list, and upon discussion with patient.  Admitted to the hospital on 12-26-2021 due to a Fall. Discharge date was 12-26-2021. Discharged from [place]?Medications Started at [facility] Discharge:?? Tramadol 50 mg every 6 hours PRN  Medication Changes at Hospital Discharge: None  Medications Discontinued at Hospital Discharge: None  Medications that remain the same after Hospital Discharge:??  -All other medications will remain the same.    Medications: Outpatient Encounter Medications as of 02/17/2022  Medication Sig   Accu-Chek Softclix Lancets lancets USE UP TO 4 TIMES DAILY AS DIRECTED   albuterol (VENTOLIN HFA) 108 (90 Base) MCG/ACT inhaler Inhale 2 puffs into the lungs every 6 (six) hours as needed for wheezing or shortness of breath.   aspirin EC 81 MG tablet Take 81 mg by mouth daily.   atorvastatin (LIPITOR) 80 MG tablet TAKE 1 TABLET BY MOUTH EVERYDAY AT BEDTIME   BD PEN NEEDLE NANO 2ND GEN 32G X 4 MM MISC USE NIGHTLY AS DIRECTED   benzonatate (TESSALON PERLES) 100 MG capsule Take 1 capsule (100 mg total) by mouth 3 (three) times daily as needed for cough. (Patient not taking: Reported on 01/20/2022)   blood glucose meter kit and supplies KIT Dispense based on patient and insurance preference. Use up to four times daily as directed. (FOR ICD-9 250.00, 250.01).   brinzolamide (AZOPT) 1 % ophthalmic suspension Place 1 drop into both eyes 3 (three) times daily. (Patient not taking: Reported on 01/20/2022)   Cholecalciferol 125 MCG (5000 UT) TABS Take 1 tablet by mouth daily.    empagliflozin (JARDIANCE) 10 MG TABS tablet Take 1 tablet (10 mg total) by mouth daily before breakfast.   ezetimibe (ZETIA) 10 MG tablet TAKE 1 TABLET BY MOUTH EVERY DAY   furosemide (LASIX) 40 MG tablet Take 1 tablet (40 mg total) by mouth daily.   insulin degludec (TRESIBA FLEXTOUCH) 100 UNIT/ML FlexTouch Pen Inject 15 Units into the skin daily.   Latanoprostene Bunod 0.024 % SOLN Apply 1 drop to eye daily.   levothyroxine (SYNTHROID) 50 MCG tablet TAKE 1 TABLET BY MOUTH EVERY DAY BEFORE BREAKFAST   LINZESS 145 MCG CAPS capsule TAKE 1 CAPSULE (145 MCG TOTAL) BY MOUTH DAILY BEFORE BREAKFAST. FOR CONSTIPATION (Patient taking differently: Patient is taking before breakfast every couple of days)   losartan (COZAAR) 50 MG tablet Take 50 mg by mouth at bedtime.   Magnesium 250 MG TABS Take 1 tablet (250 mg total) by mouth every evening. Take with evening meal (Patient not taking: Reported on 01/20/2022)   nebivolol (BYSTOLIC) 2.5 MG tablet Take 1 tablet (2.5 mg total) by mouth daily.   nystatin (NYSTATIN) powder Apply 1 application topically 3 (three) times daily.   ONETOUCH VERIO test strip CHECK BLOOD SUGARS ONCE DAILY   Semaglutide,0.25 or 0.5MG/DOS, (OZEMPIC, 0.25 OR 0.5 MG/DOSE,) 2 MG/1.5ML SOPN Inject 0.5 mg into the skin once a week.   timolol (BETIMOL) 0.5 % ophthalmic solution 1 drop 2 (two) times daily.   traMADol-acetaminophen (ULTRACET) 37.5-325 MG tablet TAKE 1 TABLET BY MOUTH EVERY 6 HOURS AS NEEDED FOR MODERATE BACK PAIN   No facility-administered encounter medications on file as  of 02/17/2022.   Reviewed chart prior to disease state call. Spoke with patient regarding BP  Recent Office Vitals: BP Readings from Last 3 Encounters:  12/26/21 (!) 142/63  11/18/21 130/74  10/15/21 140/80   Pulse Readings from Last 3 Encounters:  12/26/21 72  11/18/21 79  10/15/21 88    Wt Readings from Last 3 Encounters:  12/26/21 298 lb (135.2 kg)  11/18/21 296 lb (134.3 kg)  10/15/21 297 lb  9.6 oz (135 kg)     Kidney Function Lab Results  Component Value Date/Time   CREATININE 1.44 (H) 10/15/2021 09:20 AM   CREATININE 1.23 (H) 06/26/2021 05:06 PM   CREATININE 1.41 (H) 01/18/2016 08:22 AM   CREATININE 2.62 (H) 12/28/2015 10:27 AM   GFRNONAA 30 (L) 11/06/2020 12:02 PM   GFRAA 34 (L) 11/06/2020 12:02 PM       Latest Ref Rng & Units 10/15/2021    9:20 AM 06/26/2021    5:06 PM 11/06/2020   12:02 PM  BMP  Glucose 70 - 99 mg/dL 193   135   146    BUN 8 - 27 mg/dL 26   19   22     Creatinine 0.57 - 1.00 mg/dL 1.44   1.23   1.60    BUN/Creat Ratio 12 - 28 18   15   14     Sodium 134 - 144 mmol/L 137   140   141    Potassium 3.5 - 5.2 mmol/L 3.9   4.4   4.4    Chloride 96 - 106 mmol/L 101   101   100    CO2 20 - 29 mmol/L 27   23   22     Calcium 8.7 - 10.3 mg/dL 9.8   9.7   9.6      Current antihypertensive regimen:  Losartan 50 mg nightly Bystolic 2.5 mg daily Furosemide 40 mg daily  How often are you checking your Blood Pressure? weekly  Current home BP readings: 130/73,132/68   What recent interventions/DTPs have been made by any provider to improve Blood Pressure control since last CPP Visit:  Educated on Importance of home blood pressure monitoring; Proper BP monitoring technique; Symptoms of hypotension and importance of maintaining adequate hydration; -Patient reports that she gets nervous and afraid of her BP readings.  -Counseled to monitor BP at home monitor at least four times per week , document, and provide log at future appointments  Any recent hospitalizations or ED visits since last visit with CPP? Yes  What diet changes have been made to improve Blood Pressure Control?  Patient states she doesn't add salt to food, drinks 5 bottles of water daily and eats plenty of vegetables.  What exercise is being done to improve your Blood Pressure Control?  Patient states she walks around her house some  Adherence Review: Is the patient currently on ACE/ARB  medication? Yes Does the patient have >5 day gap between last estimated fill dates? No  Care Gaps: Covid booster overdue AWV 08-06-2022  Star Rating Drugs: Ozempic 0.5 mg- Last filled 05-08 2023 [facility] Losartan 50 mg- Last filled 12-17-2021 [facility] Jardiance 10 mg- Last filled 02-05-2022 [facility] Atorvastatin 80 mg- Last filled [facility]-23-2022 [facility]  [place] Clinical Pharmacist Assistant [phone]

## 2022-03-19 ENCOUNTER — Telehealth: Payer: Medicare Other

## 2022-03-20 ENCOUNTER — Telehealth: Payer: Self-pay

## 2022-03-20 NOTE — Chronic Care Management (AMB) (Signed)
Chronic Care Management Pharmacy Assistant   Name: Sarah Weaver  MRN: [id number] DOB: [birth year]  Reason for Encounter: Disease State/ Diabetes   Recent office visits:  None  Recent consult visits:  None  Hospital visits:  Medication Reconciliation was completed by comparing discharge summary, patient's EMR and Pharmacy list, and upon discussion with patient.   Admitted to the hospital on 12-26-2021 due to a Fall. Discharge date was 12-26-2021. Discharged from [place]?Medications Started at [facility] Discharge:?? Tramadol 50 mg every 6 hours PRN   Medication Changes at Hospital Discharge: None   Medications Discontinued at Hospital Discharge: None   Medications that remain the same after Hospital Discharge:??  -All other medications will remain the same.    Medications: Outpatient Encounter Medications as of 03/20/2022  Medication Sig   Accu-Chek Softclix Lancets lancets USE UP TO 4 TIMES DAILY AS DIRECTED   albuterol (VENTOLIN HFA) 108 (90 Base) MCG/ACT inhaler Inhale 2 puffs into the lungs every 6 (six) hours as needed for wheezing or shortness of breath.   aspirin EC 81 MG tablet Take 81 mg by mouth daily.   atorvastatin (LIPITOR) 80 MG tablet TAKE 1 TABLET BY MOUTH EVERYDAY AT BEDTIME   BD PEN NEEDLE NANO 2ND GEN 32G X 4 MM MISC USE NIGHTLY AS DIRECTED   blood glucose meter kit and supplies KIT Dispense based on patient and insurance preference. Use up to four times daily as directed. (FOR ICD-9 250.00, 250.01).   brinzolamide (AZOPT) 1 % ophthalmic suspension Place 1 drop into both eyes 3 (three) times daily. (Patient not taking: Reported on 01/20/2022)   Cholecalciferol 125 MCG (5000 UT) TABS Take 1 tablet by mouth daily.   empagliflozin (JARDIANCE) 10 MG TABS tablet Take 1 tablet (10 mg total) by mouth daily before breakfast.   ezetimibe (ZETIA) 10 MG tablet TAKE 1 TABLET BY MOUTH EVERY DAY   furosemide (LASIX) 40 MG  tablet Take 1 tablet (40 mg total) by mouth daily.   insulin degludec (TRESIBA FLEXTOUCH) 100 UNIT/ML FlexTouch Pen Inject 15 Units into the skin daily.   Latanoprostene Bunod 0.024 % SOLN Apply 1 drop to eye daily.   levothyroxine (SYNTHROID) 50 MCG tablet TAKE 1 TABLET BY MOUTH EVERY DAY BEFORE BREAKFAST   LINZESS 145 MCG CAPS capsule TAKE 1 CAPSULE (145 MCG TOTAL) BY MOUTH DAILY BEFORE BREAKFAST. FOR CONSTIPATION (Patient taking differently: Patient is taking before breakfast every couple of days)   losartan (COZAAR) 50 MG tablet Take 50 mg by mouth at bedtime.   Magnesium 250 MG TABS Take 1 tablet (250 mg total) by mouth every evening. Take with evening meal (Patient not taking: Reported on 01/20/2022)   nebivolol (BYSTOLIC) 2.5 MG tablet Take 1 tablet (2.5 mg total) by mouth daily.   nystatin (NYSTATIN) powder Apply 1 application topically 3 (three) times daily.   ONETOUCH VERIO test strip CHECK BLOOD SUGARS ONCE DAILY   Semaglutide,0.25 or 0.5MG/DOS, (OZEMPIC, 0.25 OR 0.5 MG/DOSE,) 2 MG/1.5ML SOPN Inject 0.5 mg into the skin once a week.   timolol (BETIMOL) 0.5 % ophthalmic solution 1 drop 2 (two) times daily.   traMADol-acetaminophen (ULTRACET) 37.5-325 MG tablet TAKE 1 TABLET BY MOUTH EVERY 6 HOURS AS NEEDED FOR MODERATE BACK PAIN   No facility-administered encounter medications on file as of 03/20/2022.  Recent Relevant Labs: Lab Results  Component Value Date/Time   HGBA1C 7.0 (H) 11/18/2021 04:22 PM   HGBA1C 7.2 (H) 06/26/2021 05:06 PM  Kidney Function Lab Results  Component Value Date/Time   CREATININE 1.44 (H) 10/15/2021 09:20 AM   CREATININE 1.23 (H) 06/26/2021 05:06 PM   CREATININE 1.41 (H) 01/18/2016 08:22 AM   CREATININE 2.62 (H) 12/28/2015 10:27 AM   GFRNONAA 30 (L) 11/06/2020 12:02 PM   GFRAA 34 (L) 11/06/2020 12:02 PM    Current antihyperglycemic regimen:  Jardiance 10 mg tablet once per day Tresiba - injecting 15 units daily Ozempic 0.5 mg weekly  What recent  interventions/DTPs have been made to improve glycemic control:  Educated on A1c and blood sugar goals; Complications of diabetes including kidney damage, retinal damage, and cardiovascular disease; -Counseled to check feet daily and get yearly eye exams -Counseled on diet and exercise extensively Recommended to continue current medication  Have there been any recent hospitalizations or ED visits since last visit with CPP? No  Patient denies hypoglycemic symptoms  Patient denies hyperglycemic symptoms  How often are you checking your blood sugar? once daily  What are your blood sugars ranging?  Fasting: 128, 121 Before meals: None After meals: None Bedtime: None  During the week, how often does your blood glucose drop below 70? Never  Are you checking your feet daily/regularly? Daily  Adherence Review: Is the patient currently on a STATIN medication? Yes Is the patient currently on ACE/ARB medication? Yes Does the patient have >5 day gap between last estimated fill dates? Yes  Care Gaps: Covid booster overdue AWV 08-06-2022  Star Rating Drugs: Ozempic 0.5 mg- Last [facility] Patient stated she has 2 pens left.) Losartan 50 mg- Last filled 12-17-2021 [facility] ([facility]. Patient stated she has pills on hand) Jardiance 10 mg- Last filled 02-05-2022 [facility] Atorvastatin 80 mg- Last filled 01-23-2022 [facility]  [place] Clinical Pharmacist Assistant [phone]

## 2022-03-24 ENCOUNTER — Ambulatory Visit (INDEPENDENT_AMBULATORY_CARE_PROVIDER_SITE_OTHER): Payer: Medicare Other | Admitting: Nurse Practitioner

## 2022-03-24 ENCOUNTER — Encounter: Payer: Self-pay | Admitting: Nurse Practitioner

## 2022-03-24 VITALS — BP 130/80 | HR 75 | Temp 98.4°F | Ht 64.0 in | Wt 291.0 lb

## 2022-03-24 DIAGNOSIS — E1122 Type 2 diabetes mellitus with diabetic chronic kidney disease: Secondary | ICD-10-CM

## 2022-03-24 DIAGNOSIS — I5089 Other heart failure: Secondary | ICD-10-CM | POA: Diagnosis not present

## 2022-03-24 DIAGNOSIS — E039 Hypothyroidism, unspecified: Secondary | ICD-10-CM

## 2022-03-24 DIAGNOSIS — I700 Atherosclerosis of aorta: Secondary | ICD-10-CM

## 2022-03-24 DIAGNOSIS — E782 Mixed hyperlipidemia: Secondary | ICD-10-CM

## 2022-03-24 DIAGNOSIS — I110 Hypertensive heart disease with heart failure: Secondary | ICD-10-CM

## 2022-03-24 DIAGNOSIS — N1832 Chronic kidney disease, stage 3b: Secondary | ICD-10-CM | POA: Diagnosis not present

## 2022-03-24 DIAGNOSIS — Z6841 Body Mass Index (BMI) 40.0 and over, adult: Secondary | ICD-10-CM

## 2022-03-24 DIAGNOSIS — I10 Essential (primary) hypertension: Secondary | ICD-10-CM

## 2022-03-24 DIAGNOSIS — M25542 Pain in joints of left hand: Secondary | ICD-10-CM

## 2022-03-24 DIAGNOSIS — M25541 Pain in joints of right hand: Secondary | ICD-10-CM | POA: Diagnosis not present

## 2022-03-24 DIAGNOSIS — I5032 Chronic diastolic (congestive) heart failure: Secondary | ICD-10-CM

## 2022-03-24 NOTE — Progress Notes (Signed)
I,[name],acting as a Education administrator for [employer], FNP.,have documented all relevant documentation on the behalf of [name], FNP,as directed by  [name], FNP while in the presence of [name], [place].  Subjective:     Patient ID: Sarah Weaver , female    DOB: 1935/09/22 , 86 y.o.   MRN: [id number]   Chief Complaint  Patient presents with   Diabetes    HPI  Patient presents today for a f/u on her bp and dm.  She has seen Dr. Lenard Forth - Cardiology.  She would like to know if she will have to continue her thyroid medications.   Wt Readings from Last 3 Encounters: 03/24/22 : 291 lb (132 kg) 12/26/21 : 298 lb (135.2 kg) 11/18/21 : 296 lb (134.3 kg)    Diabetes She presents for her follow-up diabetic visit. She has type 2 diabetes mellitus. There are no hypoglycemic associated symptoms. There are no diabetic associated symptoms. There are no hypoglycemic complications. There are no diabetic complications. Risk factors for coronary artery disease include diabetes mellitus, obesity and sedentary lifestyle. Current diabetic treatment includes oral agent (dual therapy). She is following a generally healthy diet. When asked about meal planning, she reported none. (Admits to not checking her blood sugar regularly)  Hypertension This is a chronic problem. The current episode started more than 1 year ago. The problem is unchanged. The problem is controlled. Pertinent negatives include no anxiety. Risk factors for coronary artery disease include obesity and sedentary lifestyle. Past treatments include diuretics. There are no compliance problems.  There is no history of angina. Identifiable causes of hypertension include a thyroid problem.     Past Medical History:  Diagnosis Date   Arthritis    "all over"   Chronic lower back pain    CKD Stage III 07/26/2018   Hx of hyperkalemia   Coronary artery disease    a. status post DES to the LAD 12/2015.   Glaucoma     HFpEF    Echocardiogram 12/2019: EF 60-65, no RWMA, mild LVH, Gr 1 DD, trivial MR, mild AoV sclerosis, no AS   Hyperkalemia    Hyperlipidemia    Hypertension    Mobitz (type) I (Wenckebach's) atrioventricular block 10/15/2021   Palpitations Zio Patch Monitor 5/21 NSR, avg HR 67, 1st degree AV block, intermittent Wenckebach during sleep, rare PACs/PVCs   Morbid obesity (HCC)    Palpitations    Type II diabetes mellitus ([place])      Family History  Problem Relation Age of Onset   Heart disease Mother    Heart attack Mother    Hypertension Mother    Heart disease Sister    Cancer Sister    Stroke Sister      Current Outpatient Medications:    Accu-Chek Softclix Lancets lancets, USE UP TO 4 TIMES DAILY AS DIRECTED (Patient not taking: Reported on 04/16/2022), Disp: 100 each, Rfl: 3   albuterol (VENTOLIN HFA) 108 (90 Base) MCG/ACT inhaler, Inhale 2 puffs into the lungs every 6 (six) hours as needed for wheezing or shortness of breath., Disp: 8 g, Rfl: 2   aspirin EC 81 MG tablet, Take 81 mg by mouth daily., Disp: , Rfl:    atorvastatin (LIPITOR) 80 MG tablet, TAKE 1 TABLET BY MOUTH EVERYDAY AT BEDTIME, Disp: 90 tablet, Rfl: 2   BD PEN NEEDLE NANO 2ND GEN 32G X 4 MM MISC, USE NIGHTLY AS DIRECTED, Disp: 100 each, Rfl: 3   blood glucose meter kit and  supplies KIT, Dispense based on patient and insurance preference. Use up to four times daily as directed. (FOR ICD-9 250.00, 250.01)., Disp: 1 each, Rfl: 0   brinzolamide (AZOPT) 1 % ophthalmic suspension, Place 1 drop into both eyes 3 (three) times daily., Disp: , Rfl:    Cholecalciferol 125 MCG (5000 UT) TABS, Take 1 tablet by mouth daily., Disp: , Rfl:    empagliflozin (JARDIANCE) 10 MG TABS tablet, Take 1 tablet (10 mg total) by mouth daily before breakfast., Disp: 90 tablet, Rfl: 1   ezetimibe (ZETIA) 10 MG tablet, TAKE 1 TABLET BY MOUTH EVERY DAY, Disp: 90 tablet, Rfl: 3   furosemide (LASIX) 40 MG tablet, Take 1 tablet (40 mg total) by mouth  daily., Disp: 90 tablet, Rfl: 3   insulin degludec (TRESIBA FLEXTOUCH) 100 UNIT/ML FlexTouch Pen, Inject 15 Units into the skin daily., Disp: 15 mL, Rfl: 1   Latanoprostene Bunod 0.024 % SOLN, Apply 1 drop to eye daily., Disp: , Rfl:    levothyroxine (SYNTHROID) 50 MCG tablet, TAKE 1 TABLET BY MOUTH EVERY DAY BEFORE BREAKFAST, Disp: 90 tablet, Rfl: 1   LINZESS 145 MCG CAPS capsule, TAKE 1 CAPSULE (145 MCG TOTAL) BY MOUTH DAILY BEFORE BREAKFAST. FOR CONSTIPATION (Patient taking differently: Patient is taking before breakfast every couple of days), Disp: 90 capsule, Rfl: 0   losartan (COZAAR) 50 MG tablet, Take 50 mg by mouth at bedtime., Disp: , Rfl:    Magnesium 250 MG TABS, Take 1 tablet (250 mg total) by mouth every evening. Take with evening meal, Disp: 90 tablet, Rfl: 1   nebivolol (BYSTOLIC) 2.5 MG tablet, Take 1 tablet (2.5 mg total) by mouth daily., Disp: 90 tablet, Rfl: 1   nystatin (NYSTATIN) powder, Apply 1 application topically 3 (three) times daily., Disp: 15 g, Rfl: 0   ONETOUCH VERIO test strip, CHECK BLOOD SUGARS ONCE DAILY, Disp: 50 strip, Rfl: 11   Semaglutide,0.25 or 0.5MG/DOS, (OZEMPIC, 0.25 OR 0.5 MG/DOSE,) 2 MG/1.5ML SOPN, Inject 0.5 mg into the skin once a week., Disp: 4.5 mL, Rfl: 1   timolol (BETIMOL) 0.5 % ophthalmic solution, 1 drop 2 (two) times daily., Disp: , Rfl:    traMADol-acetaminophen (ULTRACET) 37.5-325 MG tablet, TAKE 1 TABLET BY MOUTH EVERY 6 HOURS AS NEEDED FOR MODERATE BACK PAIN, Disp: 20 tablet, Rfl: 0   Allergies  Allergen Reactions   Carvedilol Cough    Pt reports causes her to cough   Lisinopril Cough    REPORTS CAUSES DRY COUGH     Review of Systems  Constitutional: Negative.   Respiratory: Negative.    Cardiovascular: Negative.   Gastrointestinal: Negative.   Neurological: Negative.      Today's Vitals   03/24/22 1452  BP: 130/80  Pulse: 75  Temp: 98.4 F (36.9 C)  TempSrc: Oral  Weight: 291 lb (132 kg)  Height: 5' 4"  (1.626 m)    Body mass index is 49.95 kg/m.  Wt Readings from Last 3 Encounters:  04/16/22 290 lb (131.5 kg)  03/24/22 291 lb (132 kg)  12/26/21 298 lb (135.2 kg)    Objective:  Physical Exam Vitals reviewed.  Constitutional:      General: She is not in acute distress.    Appearance: Normal appearance. She is well-developed. She is obese.  HENT:     Head: Normocephalic and atraumatic.  Eyes:     Pupils: Pupils are equal, round, and reactive to light.  Cardiovascular:     Rate and Rhythm: Normal rate and regular rhythm.  Pulses: Normal pulses.     Heart sounds: Normal heart sounds. No murmur heard. Pulmonary:     Effort: Pulmonary effort is normal.     Breath sounds: Normal breath sounds. No wheezing.  Musculoskeletal:        General: Normal range of motion.  Skin:    General: Skin is warm and dry.     Capillary Refill: Capillary refill takes less than 2 seconds.  Neurological:     General: No focal deficit present.     Mental Status: She is alert and oriented to person, place, and time.     Cranial Nerves: No cranial nerve deficit.     Motor: No weakness.  Psychiatric:        Mood and Affect: Mood normal.        Thought Content: Thought content normal.        Judgment: Judgment normal.         Assessment And Plan:     1. Type 2 diabetes mellitus with stage 3b chronic kidney disease, without long-term current use of insulin (HCC) Comments: HgbA1c is improving slowly, tolerating medications well - Hemoglobin A1c - Renal function panel with eGFR  2. Hypertensive heart disease with other congestive heart failure ([place]) Comments: Blood pressure is fairly controlled, continue current medications and follow up with Cardiology  3. Mixed hyperlipidemia Comments: Well controlled, continue current medications, tolerating statins.  - Lipid panel  4. Atherosclerosis of aorta (HCC) Comments: Continue statin, tolerating well  5. Acquired hypothyroidism Comments: Stable,  continue current medications pending lasbs will change medications Discussed the purpose of her thyroid medications - TSH - T3, free  6. Arthralgia of both hands Comments: Encouraged to wear a brace to her left wrist for pain.  - Autoimmune Profile - Sed Rate (ESR)  7. Morbid obesity with body mass index (BMI) of 45.0 to 49.9 in adult [facility]) She is encouraged to strive for BMI less than 30 to decrease cardiac risk. Advised to aim for at least 150 minutes of exercise per week.    Patient was given opportunity to ask questions. Patient verbalized understanding of the plan and was able to repeat key elements of the plan. All questions were answered to their satisfaction.  [name], FNP   I, [name], FNP, have reviewed all documentation for this visit. The documentation on 03/24/22 for the exam, diagnosis, procedures, and orders are all accurate and complete.   IF YOU HAVE BEEN REFERRED TO A SPECIALIST, IT MAY TAKE 1-2 WEEKS TO SCHEDULE/PROCESS THE REFERRAL. IF YOU HAVE NOT HEARD FROM US/SPECIALIST IN TWO WEEKS, PLEASE GIVE [place] A CALL AT [phone].   THE PATIENT IS ENCOURAGED TO PRACTICE SOCIAL DISTANCING DUE TO THE COVID-19 PANDEMIC.

## 2022-03-24 NOTE — Patient Instructions (Addendum)

## 2022-03-25 LAB — RENAL FUNCTION PANEL
Albumin: 4.2 g/dL (ref 3.7–4.7)
BUN/Creatinine Ratio: 13 (ref 12–28)
BUN: 19 mg/dL (ref 8–27)
CO2: 25 mmol/L (ref 20–29)
Calcium: 9.3 mg/dL (ref 8.7–10.3)
Chloride: 102 mmol/L (ref 96–106)
Creatinine, Ser: 1.41 mg/dL — ABNORMAL HIGH (ref 0.57–1.00)
Glucose: 118 mg/dL — ABNORMAL HIGH (ref 70–99)
Phosphorus: 3.7 mg/dL (ref 3.0–4.3)
Potassium: 4 mmol/L (ref 3.5–5.2)
Sodium: 143 mmol/L (ref 134–144)
eGFR: 37 mL/min/{1.73_m2} — ABNORMAL LOW (ref >59)

## 2022-03-25 LAB — TSH: TSH: 1.84 u[IU]/mL (ref 0.450–4.500)

## 2022-03-25 LAB — AUTOIMMUNE PROFILE
Anti Nuclear Antibody (ANA): NEGATIVE
Complement C3, Serum: 161 mg/dL (ref 82–167)
dsDNA Ab: <1 IU/mL (ref 0–9)

## 2022-03-25 LAB — SEDIMENTATION RATE: Sed Rate: 41 mm/hr — ABNORMAL HIGH (ref 0–40)

## 2022-03-25 LAB — LIPID PANEL
Chol/HDL Ratio: 2.3 ratio (ref 0.0–4.4)
Cholesterol, Total: 117 mg/dL (ref 100–199)
HDL: 50 mg/dL (ref >39)
LDL Chol Calc (NIH): 53 mg/dL (ref 0–99)
Triglycerides: 69 mg/dL (ref 0–149)
VLDL Cholesterol Cal: 14 mg/dL (ref 5–40)

## 2022-03-25 LAB — HEMOGLOBIN A1C
Est. average glucose Bld gHb Est-mCnc: 151 mg/dL
Hgb A1c MFr Bld: 6.9 % — ABNORMAL HIGH (ref 4.8–5.6)

## 2022-03-25 LAB — T3, FREE: T3, Free: 2.3 pg/mL (ref 2.0–4.4)

## 2022-03-26 ENCOUNTER — Ambulatory Visit: Payer: Self-pay

## 2022-03-26 ENCOUNTER — Telehealth: Payer: Medicare Other

## 2022-03-26 DIAGNOSIS — E782 Mixed hyperlipidemia: Secondary | ICD-10-CM

## 2022-03-26 DIAGNOSIS — I10 Essential (primary) hypertension: Secondary | ICD-10-CM

## 2022-03-26 DIAGNOSIS — E1122 Type 2 diabetes mellitus with diabetic chronic kidney disease: Secondary | ICD-10-CM

## 2022-03-27 NOTE — Chronic Care Management (AMB) (Signed)
Care Management    RN Visit Note  03/26/2022 Name: Sarah Weaver MRN: [id number] DOB: Jan 15, 1936  Subjective: Sarah Weaver is a 86 y.o. year old female who is a primary care patient of [name], [place]. The care management team was consulted for assistance with disease management and care coordination needs.    Engaged with patient by telephone for follow up visit in response to provider referral for case management and/or care coordination services.   Consent to Services:   Sarah Weaver was given information about Care Management services today including:  Care Management services includes personalized support from designated clinical staff supervised by her physician, including individualized plan of care and coordination with other care providers 24/7 contact phone numbers for assistance for urgent and routine care needs. The patient may stop case management services at any time by phone call to the office staff.  Patient agreed to services and consent obtained.   Assessment: Review of patient past medical history, allergies, medications, health status, including review of consultants reports, laboratory and other test data, was performed as part of comprehensive evaluation and provision of chronic care management services.   SDOH (Social Determinants of Health) assessments and interventions performed:  Yes, no acute needs   Care Plan  Allergies  Allergen Reactions   Carvedilol Cough    Pt reports causes her to cough   Lisinopril Cough    REPORTS CAUSES DRY COUGH    Outpatient Encounter Medications as of 03/26/2022  Medication Sig   Accu-Chek Softclix Lancets lancets USE UP TO 4 TIMES DAILY AS DIRECTED   albuterol (VENTOLIN HFA) 108 (90 Base) MCG/ACT inhaler Inhale 2 puffs into the lungs every 6 (six) hours as needed for wheezing or shortness of breath.   aspirin EC 81 MG tablet Take 81 mg by mouth daily.   atorvastatin (LIPITOR) 80 MG tablet  TAKE 1 TABLET BY MOUTH EVERYDAY AT BEDTIME   BD PEN NEEDLE NANO 2ND GEN 32G X 4 MM MISC USE NIGHTLY AS DIRECTED   blood glucose meter kit and supplies KIT Dispense based on patient and insurance preference. Use up to four times daily as directed. (FOR ICD-9 250.00, 250.01).   brinzolamide (AZOPT) 1 % ophthalmic suspension Place 1 drop into both eyes 3 (three) times daily. (Patient not taking: Reported on 01/20/2022)   Cholecalciferol 125 MCG (5000 UT) TABS Take 1 tablet by mouth daily.   empagliflozin (JARDIANCE) 10 MG TABS tablet Take 1 tablet (10 mg total) by mouth daily before breakfast.   ezetimibe (ZETIA) 10 MG tablet TAKE 1 TABLET BY MOUTH EVERY DAY   furosemide (LASIX) 40 MG tablet Take 1 tablet (40 mg total) by mouth daily.   insulin degludec (TRESIBA FLEXTOUCH) 100 UNIT/ML FlexTouch Pen Inject 15 Units into the skin daily.   Latanoprostene Bunod 0.024 % SOLN Apply 1 drop to eye daily.   levothyroxine (SYNTHROID) 50 MCG tablet TAKE 1 TABLET BY MOUTH EVERY DAY BEFORE BREAKFAST   LINZESS 145 MCG CAPS capsule TAKE 1 CAPSULE (145 MCG TOTAL) BY MOUTH DAILY BEFORE BREAKFAST. FOR CONSTIPATION (Patient taking differently: Patient is taking before breakfast every couple of days)   losartan (COZAAR) 50 MG tablet Take 50 mg by mouth at bedtime.   Magnesium 250 MG TABS Take 1 tablet (250 mg total) by mouth every evening. Take with evening meal (Patient not taking: Reported on 01/20/2022)   nebivolol (BYSTOLIC) 2.5 MG tablet Take 1 tablet (2.5 mg total) by mouth daily.   nystatin (NYSTATIN)  powder Apply 1 application topically 3 (three) times daily.   ONETOUCH VERIO test strip CHECK BLOOD SUGARS ONCE DAILY   Semaglutide,0.25 or 0.5MG/DOS, (OZEMPIC, 0.25 OR 0.5 MG/DOSE,) 2 MG/1.5ML SOPN Inject 0.5 mg into the skin once a week.   timolol (BETIMOL) 0.5 % ophthalmic solution 1 drop 2 (two) times daily.   traMADol-acetaminophen (ULTRACET) 37.5-325 MG tablet TAKE 1 TABLET BY MOUTH EVERY 6 HOURS AS NEEDED FOR  MODERATE BACK PAIN   No facility-administered encounter medications on file as of 03/26/2022.    Patient Active Problem List   Diagnosis Date Noted   Mobitz (type) I (Wenckebach's) atrioventricular block 10/15/2021   Heart palpitations 10/14/2021   Class 3 severe obesity due to excess calories with serious comorbidity in adult [facility]) 08/25/2019   Diabetes mellitus due to underlying condition with stage 3a chronic kidney disease, with long-term current use of insulin ([place]) 06/16/2019   CAD (coronary artery disease) 07/26/2018   (HFpEF) heart failure with preserved ejection fraction ([place]) 07/26/2018   Chronic kidney disease (CKD), stage III (moderate) ([facility]) 07/26/2018   Chest pain, moderate coronary artery risk 12/20/2015   DM2 (diabetes mellitus, type 2) ([place]) 12/20/2015   HTN (hypertension) 12/20/2015   HLD (hyperlipidemia) 12/20/2015   Hx of NSTEMI in 2017    Acute renal failure ([place])    Cholelithiasis 04/14/2011    Conditions to be addressed/monitored:  HTN, HLD, DM II, Stage 3a Chronic Kidney disease  Care Plan : RN Care Manager Plan of Care  Updates made by Lynne Logan, RN since 03/26/2022 12:00 AM     Problem: No plan established for management of chronic disease states (HTN, HLD, DM II, Stage 3a Chronic Kidney disease)   Priority: High     Long-Range Goal: Establish plan of care for management of chronic disease states (HTN, HLD, DM II, Stage 3a Chronic Kidney disease)   Start Date: 07/22/2021  Expected End Date: 07/22/2022  Recent Progress: On track  Priority: High  Note:   Current Barriers:  Knowledge Deficits related to plan of care for management of HTN, HLD, DM II, Stage 3a Chronic Kidney disease Chronic Disease Management support and education needs related to HTN, HLD, DM II, Stage 3a Chronic Kidney disease  RNCM Clinical Goal(s):  Patient will verbalize basic understanding of  HTN, HLD, DM II, Stage 3a Chronic Kidney disease disease process and self health  management plan   demonstrate Improved health management independence   continue to work with RN Care Manager to address care management and care coordination needs related to  HTN, HLD, DM II, Stage 3a Chronic Kidney disease will demonstrate ongoing self health care management ability    through collaboration with RN Care manager, provider, and care team.   Interventions: 1:1 collaboration with primary care provider regarding development and update of comprehensive plan of care as evidenced by provider attestation and co-signature Inter-disciplinary care team collaboration (see longitudinal plan of care) Evaluation of current treatment plan related to  self management and patient's adherence to plan as established by provider    Hypertension Interventions:  (Status:  Goal Met.) Long Term Goal Last practice recorded BP readings:  BP Readings from Last 3 Encounters:  03/24/22 130/80  12/26/21 (!) 142/63  11/18/21 130/74  Most recent eGFR/CrCl:  Lab Results  Component Value Date   EGFR 37 (L) 03/24/2022    No components found for: "CRCL"  Evaluation of current treatment plan related to hypertension self management and patient's adherence to plan as established  by provider Counseled on the importance of exercise goals with target of 150 minutes per week Advised patient, providing education and rationale, to monitor blood pressure daily and record, calling PCP for findings outside established parameters Provided education on prescribed diet low Sodium Discussed complications of poorly controlled blood pressure such as heart disease, stroke, circulatory complications, vision complications, kidney impairment, sexual dysfunction   Diabetes Interventions:  (Status:  Goal Met.) Long Term Goal Assessed patient's understanding of A1c goal: <7% Provided education to patient about basic DM disease process Reviewed medications with patient and discussed importance of medication  adherence Counseled on importance of regular laboratory monitoring as prescribed Advised patient, providing education and rationale, to check cbg daily before meals and at bedtime and record, calling PCP for findings outside established parameters Review of patient status, including review of consultants reports, relevant laboratory and other test results, and medications completed Educated patient regarding dietary and exercise recommendations, including using the plate method and portion control  Lab Results  Component Value Date   HGBA1C 6.9 (H) 03/24/2022   Chronic Kidney Disease Interventions:  (Status:  Goal Met.) Long Term Goal Assessed the Patient understanding of chronic kidney disease    Evaluation of current treatment plan related to chronic kidney disease self management and patient's adherence to plan as established by provider      Reviewed prescribed diet continue to increase your daily water intake to 48-64 oz unless otherwise directed Assessed social determinant of health barriers    Provided education on kidney disease progression    Last practice recorded BP readings:  BP Readings from Last 3 Encounters:  03/24/22 130/80  12/26/21 (!) 142/63  11/18/21 130/74  Most recent eGFR/CrCl:  Lab Results  Component Value Date   EGFR 37 (L) 03/24/2022    No components found for: "CRCL"   High Risk for Neuropathy: (Status:  Goal Met.)  Long Term Goal Evaluation of current treatment plan related to  abnormal sensation to bilateral legs , self-management and patient's adherence to plan as established by provider Discussed patient reported her symptoms to her Cardiologist and PCP, she reports her symptoms are about the same Instructed patient to report new symptoms or concerns to her doctor  Instructed patient to continue to wear her compression stockings as directed and to perform daily feet/leg inspections and report changes promptly  Discussed plans with patient for ongoing care  management follow up and provided patient with direct contact information for care management team  Falls Interventions:  (Status:  Goal Met.) Long Term Goal Provided written and verbal education re: potential causes of falls and Fall prevention strategies Advised patient of importance of notifying provider of falls Assessed for signs and symptoms of orthostatic hypotension Assessed for falls since last encounter Assessed social determinant of health barriers    Patient Goals/Self-Care Activities: Take all medications as prescribed Attend all scheduled provider appointments Call pharmacy for medication refills 3-7 days in advance of running out of medications Perform all self care activities independently  Call provider office for new concerns or questions  schedule appointment with eye doctor check feet daily for cuts, sores or redness drink 6 to 8 glasses of water each day manage portion size check blood pressure 3 times per week write blood pressure results in a log or diary take blood pressure log to all doctor appointments call doctor for signs and symptoms of high blood pressure take medications for blood pressure exactly as prescribed report new symptoms to your doctor Continue to  wear your compression socks as directed   Follow Up Plan:  No further follow up required     Barb Merino, RN, BSN, CCM Care Management Coordinator [facility]  Direct Phone: [phone]

## 2022-03-27 NOTE — Patient Instructions (Signed)
Visit Information  Thank you for taking time to visit with me today. Please don't hesitate to contact me if I can be of assistance to you before our next scheduled telephone appointment.  Following are the goals we discussed today:  Take all medications as prescribed Attend all scheduled provider appointments Call pharmacy for medication refills 3-7 days in advance of running out of medications Perform all self care activities independently  Call provider office for new concerns or questions  schedule appointment with eye doctor check feet daily for cuts, sores or redness drink 6 to 8 glasses of water each day manage portion size check blood pressure 3 times per week write blood pressure results in a log or diary take blood pressure log to all doctor appointments call doctor for signs and symptoms of high blood pressure take medications for blood pressure exactly as prescribed report new symptoms to your doctor Continue to wear your compression socks as directed  If you are experiencing a Mental Health or Behavioral Health Crisis or need someone to talk to, please call 1-800-273-TALK (toll free, 24 hour hotline)   Patient verbalizes understanding of instructions and care plan provided today and agrees to view in MyChart. Active MyChart status and patient understanding of how to access instructions and care plan via MyChart confirmed with patient.     Delsa Sale, RN, BSN, CCM Care Management Coordinator [facility]  Direct Phone: [phone]

## 2022-03-28 ENCOUNTER — Telehealth: Payer: Self-pay

## 2022-03-28 NOTE — Telephone Encounter (Signed)
  Care Management   Follow Up Note   03/28/2022 Name: Sarah Weaver MRN: [id number] DOB: 04/25/1936   Referred by: [name], FNP Reason for referral : No chief complaint on file.   A second unsuccessful telephone outreach was attempted today. The patient was referred to the case management team for assistance with care management and care coordination.   Follow Up Plan: I was unable to leave voicemail, patients voicemail was full.   Cherylin Mylar, CPP, PharmD Clinical Pharmacist Practitioner [facility] [phone]

## 2022-03-31 NOTE — Chronic Care Management (AMB) (Signed)
03-31-2022: Followed up on patient's 5 day gap on medications. [facility] filled losartan on 03-20-2022 90 DS and Ozempic 03-20-2022 28 DS.  Huey Romans [facility] Clinical Pharmacist Assistant [phone]

## 2022-04-11 ENCOUNTER — Encounter (HOSPITAL_BASED_OUTPATIENT_CLINIC_OR_DEPARTMENT_OTHER): Payer: Self-pay | Admitting: Emergency Medicine

## 2022-04-11 ENCOUNTER — Emergency Department (HOSPITAL_BASED_OUTPATIENT_CLINIC_OR_DEPARTMENT_OTHER)
Admission: EM | Admit: 2022-04-11 | Discharge: 2022-04-11 | Disposition: A | Discharge status: Home / Self Care | Payer: Medicare Other | Attending: Emergency Medicine | Admitting: Emergency Medicine

## 2022-04-11 ENCOUNTER — Emergency Department (HOSPITAL_BASED_OUTPATIENT_CLINIC_OR_DEPARTMENT_OTHER): Payer: Medicare Other | Admitting: Radiology

## 2022-04-11 ENCOUNTER — Other Ambulatory Visit: Payer: Self-pay

## 2022-04-11 DIAGNOSIS — Z7982 Long term (current) use of aspirin: Secondary | ICD-10-CM | POA: Insufficient documentation

## 2022-04-11 DIAGNOSIS — Z794 Long term (current) use of insulin: Secondary | ICD-10-CM | POA: Insufficient documentation

## 2022-04-11 DIAGNOSIS — Z79899 Other long term (current) drug therapy: Secondary | ICD-10-CM | POA: Insufficient documentation

## 2022-04-11 DIAGNOSIS — R0602 Shortness of breath: Secondary | ICD-10-CM | POA: Diagnosis not present

## 2022-04-11 DIAGNOSIS — R002 Palpitations: Secondary | ICD-10-CM

## 2022-04-11 DIAGNOSIS — I700 Atherosclerosis of aorta: Secondary | ICD-10-CM | POA: Diagnosis not present

## 2022-04-11 DIAGNOSIS — I2510 Atherosclerotic heart disease of native coronary artery without angina pectoris: Secondary | ICD-10-CM | POA: Diagnosis not present

## 2022-04-11 LAB — CBC
HCT: 40.2 % (ref 36.0–46.0)
Hemoglobin: 13.1 g/dL (ref 12.0–15.0)
MCH: 29.1 pg (ref 26.0–34.0)
MCHC: 32.6 g/dL (ref 30.0–36.0)
MCV: 89.3 fL (ref 80.0–100.0)
Platelets: 189 10*3/uL (ref 150–400)
RBC: 4.5 MIL/uL (ref 3.87–5.11)
RDW: 13.7 % (ref 11.5–15.5)
WBC: 4.7 10*3/uL (ref 4.0–10.5)
nRBC: 0 % (ref 0.0–0.2)

## 2022-04-11 LAB — BASIC METABOLIC PANEL
Anion gap: 10 (ref 5–15)
BUN: 27 mg/dL — ABNORMAL HIGH (ref 8–23)
CO2: 27 mmol/L (ref 22–32)
Calcium: 9.5 mg/dL (ref 8.9–10.3)
Chloride: 102 mmol/L (ref 98–111)
Creatinine, Ser: 1.58 mg/dL — ABNORMAL HIGH (ref 0.44–1.00)
GFR, Estimated: 32 mL/min — ABNORMAL LOW (ref >60)
Glucose, Bld: 191 mg/dL — ABNORMAL HIGH (ref 70–99)
Potassium: 3.9 mmol/L (ref 3.5–5.1)
Sodium: 139 mmol/L (ref 135–145)

## 2022-04-11 LAB — BRAIN NATRIURETIC PEPTIDE: B Natriuretic Peptide: 41 pg/mL (ref 0.0–100.0)

## 2022-04-11 LAB — TROPONIN I (HIGH SENSITIVITY)
Troponin I (High Sensitivity): 9 ng/L (ref <18)
Troponin I (High Sensitivity): 8 ng/L (ref <18)

## 2022-04-11 NOTE — ED Provider Notes (Signed)
[facility] EMERGENCY DEPT  Provider Note  CSN: [id number] Arrival date & time: 04/11/22 0139  History Chief Complaint  Patient presents with   Shortness of Breath    Sarah Weaver is a 86 y.o. female with history of CAD, s/p DES in 2017 for unstable angina reports she has had 2 days of intermittent episodes of racing heart and feeling SOB. She has not had any chest pain aside from chronic soreness in her R chest since her stent was placed. She does not have history of a documented dysrhythmia or pacemaker. She is back to baseline at the time of my evaluation.    Home Medications Prior to Admission medications   Medication Sig Start Date End Date Taking? Authorizing Provider  Accu-Chek Softclix Lancets lancets USE UP TO 4 TIMES DAILY AS DIRECTED 01/14/21   [name], FNP  albuterol (VENTOLIN HFA) 108 (90 Base) MCG/ACT inhaler Inhale 2 puffs into the lungs every 6 (six) hours as needed for wheezing or shortness of breath. 02/20/21   Bary Castilla, NP  aspirin EC 81 MG tablet Take 81 mg by mouth daily.    [provider]  atorvastatin (LIPITOR) 80 MG tablet TAKE 1 TABLET BY MOUTH EVERYDAY AT BEDTIME 12/30/21   [name], FNP  BD PEN NEEDLE NANO 2ND GEN 32G X 4 MM MISC USE NIGHTLY AS DIRECTED 02/17/22   [name], FNP  blood glucose meter kit and supplies KIT Dispense based on patient and insurance preference. Use up to four times daily as directed. (FOR ICD-9 250.00, 250.01). 11/06/20   [name], FNP  brinzolamide (AZOPT) 1 % ophthalmic suspension Place 1 drop into both eyes 3 (three) times daily. Patient not taking: Reported on 01/20/2022    [provider]  Cholecalciferol 125 MCG (5000 UT) TABS Take 1 tablet by mouth daily.    [provider]  empagliflozin (JARDIANCE) 10 MG TABS tablet Take 1 tablet (10 mg total) by mouth daily before breakfast. 12/16/21   Freada Bergeron, MD  ezetimibe (ZETIA) 10 MG tablet TAKE 1  TABLET BY MOUTH EVERY DAY 12/16/21   Freada Bergeron, MD  furosemide (LASIX) 40 MG tablet Take 1 tablet (40 mg total) by mouth daily. 05/13/21   Freada Bergeron, MD  insulin degludec (TRESIBA FLEXTOUCH) 100 UNIT/ML FlexTouch Pen Inject 15 Units into the skin daily. 11/18/21   [name], FNP  Latanoprostene Bunod 0.024 % SOLN Apply 1 drop to eye daily.    [provider]  levothyroxine (SYNTHROID) 50 MCG tablet TAKE 1 TABLET BY MOUTH EVERY DAY BEFORE BREAKFAST 11/04/21   [name], FNP  LINZESS 145 MCG CAPS capsule TAKE 1 CAPSULE (145 MCG TOTAL) BY MOUTH DAILY BEFORE BREAKFAST. FOR CONSTIPATION Patient taking differently: Patient is taking before breakfast every couple of days 06/17/21   [name], FNP  losartan (COZAAR) 50 MG tablet Take 50 mg by mouth at bedtime.    Rexene Agent, MD  Magnesium 250 MG TABS Take 1 tablet (250 mg total) by mouth every evening. Take with evening meal Patient not taking: Reported on 01/20/2022 11/06/20   [name], FNP  nebivolol (BYSTOLIC) 2.5 MG tablet Take 1 tablet (2.5 mg total) by mouth daily. 12/16/21   Freada Bergeron, MD  nystatin (NYSTATIN) powder Apply 1 application topically 3 (three) times daily. 07/11/20   [name], FNP  ONETOUCH VERIO test strip CHECK BLOOD SUGARS ONCE DAILY 05/02/19   Rodriguez-[place], Sunday Spillers, PA-C  Semaglutide,0.25 or 0.5MG/DOS, ([place],  0.25 OR 0.5 MG/DOSE,) 2 MG/1.5ML SOPN Inject 0.5 mg into the skin once a week. 11/18/21   [name], FNP  timolol (BETIMOL) 0.5 % ophthalmic solution 1 drop 2 (two) times daily.    [provider]  traMADol-acetaminophen (ULTRACET) 37.5-325 MG tablet TAKE 1 TABLET BY MOUTH EVERY 6 HOURS AS NEEDED FOR MODERATE BACK PAIN 01/20/22   [name], FNP     Allergies    Carvedilol and Lisinopril   Review of Systems   Review of Systems Please see HPI for pertinent positives and negatives  Physical Exam BP (!) 143/63   Pulse 64   Temp 98.1 F (36.7  C) (Oral)   Resp 16   SpO2 98%   Physical Exam Vitals and nursing note reviewed.  Constitutional:      Appearance: Normal appearance.  HENT:     Head: Normocephalic and atraumatic.     Nose: Nose normal.     Mouth/Throat:     Mouth: Mucous membranes are moist.  Eyes:     Extraocular Movements: Extraocular movements intact.     Conjunctiva/sclera: Conjunctivae normal.  Cardiovascular:     Rate and Rhythm: Normal rate and regular rhythm.  Pulmonary:     Effort: Pulmonary effort is normal.     Breath sounds: Normal breath sounds.  Chest:     Chest wall: Tenderness (R upper chest wall) present.  Abdominal:     General: Abdomen is flat.     Palpations: Abdomen is soft.     Tenderness: There is no abdominal tenderness.  Musculoskeletal:        General: No swelling. Normal range of motion.     Cervical back: Neck supple.     Right lower leg: Edema (trace) present.     Left lower leg: Edema (trace) present.  Skin:    General: Skin is warm and dry.  Neurological:     General: No focal deficit present.     Mental Status: She is alert.  Psychiatric:        Mood and Affect: Mood normal.     ED Results / Procedures / Treatments   EKG None  Procedures Procedures  Medications Ordered in the ED Medications - No data to display  Initial Impression and Plan  Patient with history of CAD, here with tachypalpitations and SOB. Currently HR and SpO2 are normal. No distress. She is NSR on monitor. Will check labs, CXR and re-evaluate.   ED Course   Clinical Course as of 04/11/22 0559  Fri Apr 11, 2022  0305 CBC is normal.  [CS]  1941 BMP with CKD about at baseline. Trop is neg.  [CS]  7408 BNP is normal.  [CS]  0526 Delta Trop is unchanged.  [CS]  [id number] Patient remains asymptomatic in the ED. HR has been in the 60s with no signs of tachycardia. Patient would like to go home. Recommend close Cardiology follow up and to call EMS if her heart begins racing again so that an EKG can  be captured during her symptoms. RTED for any other concerns.  [CS]    Clinical Course User Index [CS] Truddie Hidden, MD     MDM Rules/Calculators/A&P Medical Decision Making Given presenting complaint, I considered that admission might be necessary. After review of results from ED lab and/or imaging studies, admission to the hospital is not indicated at this time.    Problems Addressed: Heart palpitations: acute illness or injury Palpitations: acute illness or injury  Amount and/or  Complexity of Data Reviewed Labs: ordered. Decision-making details documented in ED Course. Radiology: ordered and independent interpretation performed. Decision-making details documented in ED Course. ECG/medicine tests: ordered and independent interpretation performed. Decision-making details documented in ED Course.  Risk Decision regarding hospitalization.    Final Clinical Impression(s) / ED Diagnoses Final diagnoses:  Palpitations  Heart palpitations    Rx / DC Orders ED Discharge Orders          Ordered    Ambulatory referral to Cardiology       Comments: If you have not heard from the Cardiology office within the next 72 hours please call [phone].   04/11/22 2076             Truddie Hidden, MD 04/11/22 [phone]

## 2022-04-11 NOTE — ED Triage Notes (Signed)
Pt reports feeling sob and "like my heart is pounding fast" on and off since yesterday. Reports she felt like this when she had her heart attack. Took asa 3- 81mg  tabs tonight at 10:30.  It helps some. States she has soreness mid chest

## 2022-04-14 ENCOUNTER — Telehealth: Payer: Self-pay

## 2022-04-14 NOTE — Telephone Encounter (Signed)
Spoke with patient after visiting the ED on 04/11/2022. Patient reports she is doing okay, she does not feel the need of an appointment at the moment. She will give the office a call if she changes her mind.

## 2022-04-15 NOTE — Progress Notes (Unsigned)
Cardiology Office Note:    Date:  04/16/2022   ID:  Sarah Weaver, DOB 10/03/1935, MRN [id number]  PCP:  [name], [place] Providers Cardiologist:  Freada Bergeron, MD Cardiology APP:  Sharmon Revere     Referring MD: [name], FNP   Chief Complaint: worsening dyspnea  History of Present Illness:    Sarah Weaver is a 86 y.o. female with a hx of   Patient Profile: Coronary artery disease NSTEMI:  S/p DES to the LAD 4/17 (HFpEF) heart failure with preserved ejection fraction  Palpitations Monitor 5/21: PACs/PVCs; intermittent Wenckebach during sleep  Hypertension Diabetes mellitus type 2 Hyperlipidemia Glaucoma Morbid obesity Chronic kidney disease, stage III History of hyperkalemia    Prior CV Studies: Zio Patch Monitor 5/21 NSR, avg HR 67, 1st degree AV block, intermittent Wenckebach during sleep, rare PACs/PVCs   Echocardiogram 01/12/2020 EF 60-65, no RWMA, mild LVH, Gr 1 DD, trivial MR, AoV sclerosis (no AS)   Echocardiogram 12/21/2015 Mild LVH, EF 55-60, normal wall motion, GR 1 DD, MAC   Cardiac catheterization 12/20/2015 LAD ostial 25, mid 99; D2 ostial 40 LCx proximal 40 PCI: 2.5 x 20 mm Synergy DES to the mid LAD   She was last seen in our office on 10/15/2021 by Richardson Dopp, PA.  No changes were made to her treatment plan and she was advised to follow-up in 6 months.  She presented to ED on 04/11/2022 with shortness of breath and palpitations. Hs troponin was negative. NSR on monitor. BNP normal. No acute findings, advised to follow-up with cardiology.  Today, she is here for follow-up of recent ED visit. She is here alone. Uses a rollating walker. States that reason for ED visit was feeling shortness of breath and fast HR that would occur for approximately 10 minutes and then come back. Went to ED, benign work up, then symptoms occurred again 3 days later. Is sitting when this occurs, also occurred  at 0200 one morning. Usually sleeps on 2 pillows, no change in that recently. No edema, PND. Chest soreness for as long as she can remember, believes since when MI occurred in 2017. Takes some short walks during the day. Has increased dyspnea when she picks up the pace. Had recent normal lab work including thyroid function, BNP, CBC, lipid panel, CMET. No bleeding problems.   Past Medical History:  Diagnosis Date   Arthritis    "all over"   Chronic lower back pain    CKD Stage III 07/26/2018   Hx of hyperkalemia   Coronary artery disease    a. status post DES to the LAD 12/2015.   Glaucoma    HFpEF    Echocardiogram 12/2019: EF 60-65, no RWMA, mild LVH, Gr 1 DD, trivial MR, mild AoV sclerosis, no AS   Hyperkalemia    Hyperlipidemia    Hypertension    M[place]itz (type) I (Wenckebach's) atrioventricular block 10/15/2021   Palpitations Zio Patch Monitor 5/21 NSR, avg HR 67, 1st degree AV block, intermittent Wenckebach during sleep, rare PACs/PVCs   Morbid obesity (HCC)    Palpitations    Type II diabetes mellitus ([place])     Past Surgical History:  Procedure Laterality Date   CARDIAC CATHETERIZATION N/A 12/20/2015   Procedure: Left Heart Cath and Coronary Angiography;  Surgeon: Lorretta Harp, MD;  Location: [facility];  Service: Cardiovascular;  Laterality: N/A;   CARDIAC CATHETERIZATION N/A 12/20/2015   Procedure: Coronary Stent Intervention;  Surgeon: Lorretta Harp, MD;  Location: [facility];  Service: Cardiovascular;  Laterality: N/A;  mid LAD 2.5 x 20 Synergy   CATARACT EXTRACTION W/ INTRAOCULAR LENS  IMPLANT, BILATERAL Bilateral    CORONARY ANGIOPLASTY WITH STENT PLACEMENT  12/20/2015   LAPAROSCOPIC CHOLECYSTECTOMY  04/23/11   TONSILLECTOMY      Current Medications: Current Meds  Medication Sig   albuterol (VENTOLIN HFA) 108 (90 Base) MCG/ACT inhaler Inhale 2 puffs into the lungs every 6 (six) hours as needed for wheezing or shortness of breath.   aspirin EC 81 MG  tablet Take 81 mg by mouth daily.   atorvastatin (LIPITOR) 80 MG tablet TAKE 1 TABLET BY MOUTH EVERYDAY AT BEDTIME   BD PEN NEEDLE NANO 2ND GEN 32G X 4 MM MISC USE NIGHTLY AS DIRECTED   blood glucose meter kit and supplies KIT Dispense based on patient and insurance preference. Use up to four times daily as directed. (FOR ICD-9 250.00, 250.01).   brinzolamide (AZOPT) 1 % ophthalmic suspension Place 1 drop into both eyes 3 (three) times daily.   Cholecalciferol 125 MCG (5000 UT) TABS Take 1 tablet by mouth daily.   empagliflozin (JARDIANCE) 10 MG TABS tablet Take 1 tablet (10 mg total) by mouth daily before breakfast.   ezetimibe (ZETIA) 10 MG tablet TAKE 1 TABLET BY MOUTH EVERY DAY   furosemide (LASIX) 40 MG tablet Take 1 tablet (40 mg total) by mouth daily.   insulin degludec (TRESIBA FLEXTOUCH) 100 UNIT/ML FlexTouch Pen Inject 15 Units into the skin daily.   Latanoprostene Bunod 0.024 % SOLN Apply 1 drop to eye daily.   levothyroxine (SYNTHROID) 50 MCG tablet TAKE 1 TABLET BY MOUTH EVERY DAY BEFORE BREAKFAST   LINZESS 145 MCG CAPS capsule TAKE 1 CAPSULE (145 MCG TOTAL) BY MOUTH DAILY BEFORE BREAKFAST. FOR CONSTIPATION (Patient taking differently: Patient is taking before breakfast every couple of days)   losartan (COZAAR) 50 MG tablet Take 50 mg by mouth at bedtime.   Magnesium 250 MG TABS Take 1 tablet (250 mg total) by mouth every evening. Take with evening meal   nebivolol (BYSTOLIC) 2.5 MG tablet Take 1 tablet (2.5 mg total) by mouth daily.   nystatin (NYSTATIN) powder Apply 1 application topically 3 (three) times daily.   ONETOUCH VERIO test strip CHECK BLOOD SUGARS ONCE DAILY   Semaglutide,0.25 or 0.5MG/DOS, (OZEMPIC, 0.25 OR 0.5 MG/DOSE,) 2 MG/1.5ML SOPN Inject 0.5 mg into the skin once a week.   timolol (BETIMOL) 0.5 % ophthalmic solution 1 drop 2 (two) times daily.   traMADol-acetaminophen (ULTRACET) 37.5-325 MG tablet TAKE 1 TABLET BY MOUTH EVERY 6 HOURS AS NEEDED FOR MODERATE BACK  PAIN     Allergies:   Carvedilol and Lisinopril   Social History   Socioeconomic History   Marital status: Divorced    Spouse name: Not on file   Number of children: Not on file   Years of education: Not on file   Highest education level: Not on file  Occupational History   Occupation: retired  Tobacco Use   Smoking status: Never   Smokeless tobacco: Never  Vaping Use   Vaping Use: Never used  Substance and Sexual Activity   Alcohol use: No   Drug use: No   Sexual activity: Not Currently  Other Topics Concern   Not on file  Social History Narrative   Not on file   Social Determinants of Health   Financial Resource Strain: Low Risk  (07/25/2021)   Overall  Financial Resource Strain (CARDIA)    Difficulty of Paying Living Expenses: Not hard at all  Food Insecurity: No Food Insecurity (07/25/2021)   Hunger Vital Sign    Worried About Running Out of Food in the Last Year: Never true    Ran Out of Food in the Last Year: Never true  Transportation Needs: No Transportation Needs (07/25/2021)   PRAPARE - Hydrologist (Medical): No    Lack of Transportation (Non-Medical): No  Physical Activity: Inactive (07/25/2021)   Exercise Vital Sign    Days of Exercise per Week: 0 days    Minutes of Exercise per Session: 0 min  Stress: No Stress Concern Present (07/25/2021)   [place]    Feeling of Stress : Not at all  Social Connections: Moderately Isolated (08/10/2018)   Social Connection and Isolation Panel [NHANES]    Frequency of Communication with Friends and Family: More than three times a week    Frequency of Social Gatherings with Friends and Family: More than three times a week    Attends Religious Services: Never    Marine scientist or Organizations: No    Attends Archivist Meetings: Never    Marital Status: Divorced     Family History: The patient's family  history includes Cancer in her sister; Heart attack in her mother; Heart disease in her mother and sister; Hypertension in her mother; Stroke in her sister.  ROS:   Please see the history of present illness.    + shortness of breath + palpitations All other systems reviewed and are negative.  Labs/Other Studies Reviewed:    CV studies noted above Recent Labs: 2021-07-09: ALT 34 03/24/2022: TSH 1.840 04/11/2022: B Natriuretic Peptide 41.0; BUN 27; Creatinine, Ser 1.58; Hemoglobin 13.1; Platelets 189; Potassium 3.9; Sodium 139  Recent Lipid Panel    Component Value Date/Time   CHOL 117 03/24/2022 1535   TRIG 69 03/24/2022 1535   HDL 50 03/24/2022 1535   CHOLHDL 2.3 03/24/2022 1535   CHOLHDL 3.3 12/21/2015 0400   VLDL 15 12/21/2015 0400   LDLCALC 53 03/24/2022 1535     Risk Assessment/Calculations:      Physical Exam:    VS:  BP 132/82   Pulse 74   Ht _0  (1.626 m)   Wt 290 lb (131.5 kg)   SpO2 98%   BMI 49.78 kg/m     Wt Readings from Last 3 Encounters:  04/16/22 290 lb (131.5 kg)  03/24/22 291 lb (132 kg)  12/26/21 298 lb (135.2 kg)     GEN: Well developed obese female in no acute distress HEENT: Normal NECK: No JVD; No carotid bruits CARDIAC: RRR, no murmurs, rubs, gallops RESPIRATORY:  Clear to auscultation without rales, wheezing or rhonchi  ABDOMEN: Soft, non-tender, obese MUSCULOSKELETAL:  Mild bilateral LE edema; No deformity. 2+ pedal pulses, equal bilaterally SKIN: Warm and dry NEUROLOGIC:  Alert and oriented x 3 PSYCHIATRIC:  Normal affect   EKG:  EKG is not ordered today.    Diagnoses:    1. Coronary artery disease involving native coronary artery of native heart without angina pectoris   2. Chronic heart failure with preserved ejection fraction ([place])   3. Essential hypertension   4. Stage 3b chronic kidney disease ([place])   5. DOE (dyspnea on exertion)   6. Hyperlipidemia LDL goal <70    Assessment and Plan:     CAD without angina:  Prior  history of DES to LAD 2017. Describes chest soreness that has been stable for many years. She denies chest pain, dyspnea, or other symptoms concerning for angina. No indication for further ischemic evaluation at this time. Continue aspirin, atorvastatin, Zetia, losartan, nebivolol  Dyspnea on exertion/Chronic HFpEF: Recent increase in dyspnea when walking faster than normal pace accompanied by palpitations. This occurred on various occasions lasting approximately 10 minutes at the time over the past few weeks.  Body habitus makes volume status difficult to assess.  No obvious volume overload.  She denies orthopnea, PND. Takes short walks for exercise, otherwise mostly sedentary lifestyle. Will get echocardiogram to assess heart and valve function.  Continue losartan, furosemide, nebivolol, Jardiance  Palpitations: She reports rare palpitations, but has more of a feeling of HR speeding up associated with dyspnea. We discussed prior PVCs/PACs seen on cardiac monitor 01/2020. Discussed repeating monitor. Will proceed with echo. I asked her to notify us if symptoms of palpitations/fast HR increase at which time we will place a monitor.   Hypertension: BP is well-controlled. She does not monitor on a consistent basis.  No medication changes today.  CKD Stage 3: Recent creatinine revealed stable kidney function. Dr. Joelyn Oms, nephrology.Is due for follow-up soon. Encouraged continued consistent follow-up.   Hyperlipidemia LDL goal < 70: She asked if symptoms could be coming from elevated cholesterol. LDL 53 on 03/24/22.  Advised her high cholesterol is likely asymptomatic, however it is of primary importance with history of CAD.Encouraged that hers is well-controlled.   Disposition: 6 months with Dr. Johney Frame    Medication Adjustments/Labs and Tests Ordered: Current medicines are reviewed at length with the patient today.  Concerns regarding medicines are outlined above.  Orders Placed This Encounter   Procedures   ECHOCARDIOGRAM COMPLETE   No orders of the defined types were placed in this encounter.   Patient Instructions  Medication Instructions:   Your physician recommends that you continue on your current medications as directed. Please refer to the Current Medication list given to you today.   *If you need a refill on your cardiac medications before your next appointment, please call your pharmacy*   Lab Work:  None ordered.  If you have labs (blood work) drawn today and your tests are completely normal, you will receive your results only by: [place] (if you have MyChart) OR A paper copy in the mail If you have any lab test that is abnormal or we need to change your treatment, we will call you to review the results.   Testing/Procedures:  Your physician has requested that you have an echocardiogram. Echocardiography is a painless test that uses sound waves to create images of your heart. It provides your doctor with information about the size and shape of your heart and how well your heart's chambers and valves are working. This procedure takes approximately one hour. There are no restrictions for this procedure.    Follow-Up: At [facility], you and your health needs are our priority.  As part of our continuing mission to provide you with exceptional heart care, we have created designated Provider Care Teams.  These Care Teams include your primary Cardiologist (physician) and Advanced Practice Providers (APPs -  Physician Assistants and Nurse Practitioners) who all work together to provide you with the care you need, when you need it.  We recommend signing up for the patient portal called "MyChart".  Sign up information is provided on this After Visit Summary.  MyChart is used to  connect with patients for Virtual Visits (Telemedicine).  Patients are able to view lab/test results, encounter notes, upcoming appointments, etc.  Non-urgent messages can be sent to  your provider as well.   To learn more about what you can do with MyChart, go to [email or web address].    Your next appointment:   6 month(s)  The format for your next appointment:   In Person  Provider:   Freada Bergeron, MD     Other Instructions  Your physician wants you to follow-up in: 6 months with Dr. Johney Frame. You will receive a reminder letter in the mail two months in advance. If you don't receive a letter, please call our office to schedule the follow-up appointment.   Important Information About Sugar         Signed, Emmaline Life, NP  04/16/2022 9:37 AM    [place]

## 2022-04-16 ENCOUNTER — Ambulatory Visit (INDEPENDENT_AMBULATORY_CARE_PROVIDER_SITE_OTHER): Payer: Medicare Other | Admitting: Nurse Practitioner

## 2022-04-16 ENCOUNTER — Encounter: Payer: Self-pay | Admitting: Nurse Practitioner

## 2022-04-16 VITALS — BP 132/82 | HR 74 | Ht 64.0 in | Wt 290.0 lb

## 2022-04-16 DIAGNOSIS — R0609 Other forms of dyspnea: Secondary | ICD-10-CM

## 2022-04-16 DIAGNOSIS — N1832 Chronic kidney disease, stage 3b: Secondary | ICD-10-CM | POA: Diagnosis not present

## 2022-04-16 DIAGNOSIS — I10 Essential (primary) hypertension: Secondary | ICD-10-CM

## 2022-04-16 DIAGNOSIS — E785 Hyperlipidemia, unspecified: Secondary | ICD-10-CM | POA: Diagnosis not present

## 2022-04-16 DIAGNOSIS — I2510 Atherosclerotic heart disease of native coronary artery without angina pectoris: Secondary | ICD-10-CM | POA: Diagnosis not present

## 2022-04-16 DIAGNOSIS — I5032 Chronic diastolic (congestive) heart failure: Secondary | ICD-10-CM | POA: Diagnosis not present

## 2022-04-16 NOTE — Patient Instructions (Signed)
Medication Instructions:   Your physician recommends that you continue on your current medications as directed. Please refer to the Current Medication list given to you today.   *If you need a refill on your cardiac medications before your next appointment, please call your pharmacy*   Lab Work:  None ordered.  If you have labs (blood work) drawn today and your tests are completely normal, you will receive your results only by: MyChart Message (if you have MyChart) OR A paper copy in the mail If you have any lab test that is abnormal or we need to change your treatment, we will call you to review the results.   Testing/Procedures:  Your physician has requested that you have an echocardiogram. Echocardiography is a painless test that uses sound waves to create images of your heart. It provides your doctor with information about the size and shape of your heart and how well your heart's chambers and valves are working. This procedure takes approximately one hour. There are no restrictions for this procedure.    Follow-Up: At [facility], you and your health needs are our priority.  As part of our continuing mission to provide you with exceptional heart care, we have created designated Provider Care Teams.  These Care Teams include your primary Cardiologist (physician) and Advanced Practice Providers (APPs -  Physician Assistants and Nurse Practitioners) who all work together to provide you with the care you need, when you need it.  We recommend signing up for the patient portal called "MyChart".  Sign up information is provided on this After Visit Summary.  MyChart is used to connect with patients for Virtual Visits (Telemedicine).  Patients are able to view lab/test results, encounter notes, upcoming appointments, etc.  Non-urgent messages can be sent to your provider as well.   To learn more about what you can do with MyChart, go to [email or web address].    Your next appointment:    6 month(s)  The format for your next appointment:   In Person  Provider:   Meriam Sprague, MD     Other Instructions  Your physician wants you to follow-up in: 6 months with Dr. Shari Prows. You will receive a reminder letter in the mail two months in advance. If you don't receive a letter, please call our office to schedule the follow-up appointment.   Important Information About Sugar

## 2022-04-17 DIAGNOSIS — H401131 Primary open-angle glaucoma, bilateral, mild stage: Secondary | ICD-10-CM | POA: Diagnosis not present

## 2022-04-21 ENCOUNTER — Telehealth: Payer: Self-pay

## 2022-04-21 NOTE — Chronic Care Management (AMB) (Signed)
Chronic Care Management Pharmacy Assistant   Name: Sarah Weaver  MRN: [id number] DOB: [birth year]   Reason for Encounter: Disease State/ Diabetes  Recent office visits:  03-26-2022 Lynne Logan, RN (CCM).  03-24-2022 [name], [place]. A1C= 6.9. Glucose= 118, Creatinine= 1.41, eGFR= 37. Sed Rate= 41  Recent consult visits:  04-16-2022 Swinyer, Lanice Schwab, NP (Cardiology). Echocardiogram ordered.  Hospital visits:  Medication Reconciliation was completed by comparing discharge summary, patient's EMR and Pharmacy list, and upon discussion with patient.  Admitted to the hospital on 04-11-2022 due to Palpitations. Discharge date was 04-11-2022.  Discharged from [facility]   New?Medications Started at [facility] Discharge:?? None  Medication Changes at Hospital Discharge: None  Medications Discontinued at Hospital Discharge: None  Medications that remain the same after Hospital Discharge:??  -All other medications will remain the same.    Medications: Outpatient Encounter Medications as of 04/21/2022  Medication Sig   Accu-Chek Softclix Lancets lancets USE UP TO 4 TIMES DAILY AS DIRECTED (Patient not taking: Reported on 04/16/2022)   albuterol (VENTOLIN HFA) 108 (90 Base) MCG/ACT inhaler Inhale 2 puffs into the lungs every 6 (six) hours as needed for wheezing or shortness of breath.   aspirin EC 81 MG tablet Take 81 mg by mouth daily.   atorvastatin (LIPITOR) 80 MG tablet TAKE 1 TABLET BY MOUTH EVERYDAY AT BEDTIME   BD PEN NEEDLE NANO 2ND GEN 32G X 4 MM MISC USE NIGHTLY AS DIRECTED   blood glucose meter kit and supplies KIT Dispense based on patient and insurance preference. Use up to four times daily as directed. (FOR ICD-9 250.00, 250.01).   brinzolamide (AZOPT) 1 % ophthalmic suspension Place 1 drop into both eyes 3 (three) times daily.   Cholecalciferol 125 MCG (5000 UT) TABS Take 1 tablet by mouth daily.   empagliflozin (JARDIANCE) 10 MG TABS  tablet Take 1 tablet (10 mg total) by mouth daily before breakfast.   ezetimibe (ZETIA) 10 MG tablet TAKE 1 TABLET BY MOUTH EVERY DAY   furosemide (LASIX) 40 MG tablet Take 1 tablet (40 mg total) by mouth daily.   insulin degludec (TRESIBA FLEXTOUCH) 100 UNIT/ML FlexTouch Pen Inject 15 Units into the skin daily.   Latanoprostene Bunod 0.024 % SOLN Apply 1 drop to eye daily.   levothyroxine (SYNTHROID) 50 MCG tablet TAKE 1 TABLET BY MOUTH EVERY DAY BEFORE BREAKFAST   LINZESS 145 MCG CAPS capsule TAKE 1 CAPSULE (145 MCG TOTAL) BY MOUTH DAILY BEFORE BREAKFAST. FOR CONSTIPATION (Patient taking differently: Patient is taking before breakfast every couple of days)   losartan (COZAAR) 50 MG tablet Take 50 mg by mouth at bedtime.   Magnesium 250 MG TABS Take 1 tablet (250 mg total) by mouth every evening. Take with evening meal   nebivolol (BYSTOLIC) 2.5 MG tablet Take 1 tablet (2.5 mg total) by mouth daily.   nystatin (NYSTATIN) powder Apply 1 application topically 3 (three) times daily.   ONETOUCH VERIO test strip CHECK BLOOD SUGARS ONCE DAILY   Semaglutide,0.25 or 0.5MG/DOS, (OZEMPIC, 0.25 OR 0.5 MG/DOSE,) 2 MG/1.5ML SOPN Inject 0.5 mg into the skin once a week.   timolol (BETIMOL) 0.5 % ophthalmic solution 1 drop 2 (two) times daily.   traMADol-acetaminophen (ULTRACET) 37.5-325 MG tablet TAKE 1 TABLET BY MOUTH EVERY 6 HOURS AS NEEDED FOR MODERATE BACK PAIN   No facility-administered encounter medications on file as of 04/21/2022.  Recent Relevant Labs: Lab Results  Component Value Date/Time   HGBA1C 6.9 (H) 03/24/2022 03:35 PM  HGBA1C 7.0 (H) 11/18/2021 04:22 PM    Kidney Function Lab Results  Component Value Date/Time   CREATININE 1.58 (H) 04/11/2022 02:56 AM   CREATININE 1.41 (H) 03/24/2022 03:35 PM   CREATININE 1.41 (H) 01/18/2016 08:22 AM   CREATININE 2.62 (H) 12/28/2015 10:27 AM   GFRNONAA 32 (L) 04/11/2022 02:56 AM   GFRAA 34 (L) 11/06/2020 12:02 PM    Current antihyperglycemic  regimen:  Jardiance 10 mg tablet once per day Tresiba - injecting 15 units daily Ozempic 0.5 mg weekly  What recent interventions/DTPs have been made to improve glycemic control:  Educated on A1c and blood sugar goals; Complications of diabetes including kidney damage, retinal damage, and cardiovascular disease; -Counseled to check feet daily and get yearly eye exams -Counseled on diet and exercise extensively Recommended to continue current medication  Have there been any recent hospitalizations or ED visits since last visit with CPP? Yes  Patient denies hypoglycemic symptoms  Patient denies hyperglycemic symptoms  How often are you checking your blood sugar? 3-4 times weekly  What are your blood sugars ranging?  Fasting: 08-06 129, 08-04 123 Before meals: None After meals: None Bedtime: None  During the week, how often does your blood glucose drop below 70? Never  Are you checking your feet daily/regularly? Daily  Adherence Review: Is the patient currently on a STATIN medication? Yes Is the patient currently on ACE/ARB medication? Yes Does the patient have >5 day gap between last estimated fill dates? No   Care Gaps: Covid booster overdue AWV 08-06-2022 Flu vaccine overdue  Star Rating Drugs: Ozempic 0.5 mg- Last filled 03-20-2022 [facility] Losartan 50 mg- Last filled 04-04-2022 [facility] Jardiance 10 mg- Last filled 02-05-2022 [facility] Atorvastatin 80 mg- Last filled 01-23-2022 [facility]  [place] Clinical Pharmacist Assistant [phone]

## 2022-04-24 ENCOUNTER — Ambulatory Visit (INDEPENDENT_AMBULATORY_CARE_PROVIDER_SITE_OTHER): Payer: Medicare Other | Admitting: Nurse Practitioner

## 2022-04-24 ENCOUNTER — Encounter: Payer: Self-pay | Admitting: Nurse Practitioner

## 2022-04-24 VITALS — Temp 98.4°F | Ht 64.0 in | Wt 290.2 lb

## 2022-04-24 DIAGNOSIS — R110 Nausea: Secondary | ICD-10-CM | POA: Diagnosis not present

## 2022-04-24 DIAGNOSIS — F419 Anxiety disorder, unspecified: Secondary | ICD-10-CM

## 2022-04-24 DIAGNOSIS — R252 Cramp and spasm: Secondary | ICD-10-CM | POA: Diagnosis not present

## 2022-04-24 MED ORDER — MAGNESIUM 250 MG PO TABS: 1.0000 | ORAL_TABLET | Freq: Every evening | ORAL | 1 refills | Status: AC

## 2022-04-24 NOTE — Patient Instructions (Addendum)
Dizziness Dizziness is a common problem. It makes you feel unsteady or light-headed. You may feel like you are about to pass out (faint). Dizziness can lead to getting hurt if you stumble or fall. Dizziness can be caused by many things, including: Medicines. Not having enough water in your body (dehydration). Illness. Follow these instructions at home: Eating and drinking  Drink enough fluid to keep your pee (urine) pale yellow. This helps to keep you from getting dehydrated. Try to drink more clear fluids, such as water. Do not drink alcohol. Limit how much caffeine you drink or eat, if your doctor tells you to do that. Limit how much salt (sodium) you drink or eat, if your doctor tells you to do that. Activity  Avoid making quick movements. Stand up slowly from sitting in a chair, and steady yourself until you feel okay. In the morning, first sit up on the side of the bed. When you feel okay, stand up slowly while you hold onto something. Do this until you know that your balance is okay. If you need to stand in one place for a long time, move your legs often. Tighten and relax the muscles in your legs while you are standing. Do not drive or use machinery if you feel dizzy. Avoid bending down if you feel dizzy. Place items in your home so you can reach them easily without leaning over. Lifestyle Do not smoke or use any products that contain nicotine or tobacco. If you need help quitting, ask your doctor. Try to lower your stress level. You can do this by using methods such as yoga or meditation. Talk with your doctor if you need help. General instructions Watch your dizziness for any changes. Take over-the-counter and prescription medicines only as told by your doctor. Talk with your doctor if you think that you are dizzy because of a medicine that you are taking. Tell a friend or a family member that you are feeling dizzy. If he or she notices any changes in your behavior, have this  person call your doctor. Keep all follow-up visits. Contact a doctor if: Your dizziness does not go away. Your dizziness or light-headedness gets worse. You feel like you may vomit (are nauseous). You have trouble hearing. You have new symptoms. You are unsteady on your feet. You feel like the room is spinning. You have neck pain or a stiff neck. You have a fever. Get help right away if: You vomit or have watery poop (diarrhea), and you cannot eat or drink anything. You have trouble: Talking. Walking. Swallowing. Using your arms, hands, or legs. You feel generally weak. You are not thinking clearly, or you have trouble forming sentences. A friend or family member may notice this. You have: Chest pain. Pain in your belly (abdomen). Shortness of breath. Sweating. Your vision changes. You are bleeding. You have a very bad headache. These symptoms may be an emergency. Get help right away. Call your local emergency services (911 in the [place] Do not wait to see if the symptoms will go away. Do not drive yourself to the hospital. Summary Dizziness makes you feel unsteady or light-headed. You may feel like you are about to pass out (faint). Drink enough fluid to keep your pee (urine) pale yellow. Do not drink alcohol. Avoid making quick movements if you feel dizzy. Watch your dizziness for any changes. This information is not intended to replace advice given to you by your health care provider. Make sure you discuss any questions   you have with your health care provider. Document Revised: 08/06/2020 Document Reviewed: 08/06/2020 Elsevier Patient Education  2023 Elsevier Inc.   Melatonin Capsules or Tablets What is this medication? MELATONIN (mel uh TOH nin) is promoted for sleep disorders, such as insomnia or jet lag. Melatonin helps regulate your sleep cycle. This supplement is not intended to diagnose, treat, cure, or prevent any disease. This medicine may be used for other  purposes; ask your health care provider or pharmacist if you have questions. COMMON BRAND NAME(S): Melatonex What should I tell my care team before I take this medication? They need to know if you have any of these conditions: Cancer Depression or mental illness Diabetes Hormone problems If you often drink alcohol Immune system problems Liver disease Lung or breathing disease, like asthma Organ transplant Seizure disorder An unusual or allergic reaction to melatonin, other medications, foods, dyes, or preservatives Pregnant or trying to get pregnant Breast-feeding How should I use this medication? Take this medication by mouth with a glass of water. Do not take with food. This medication is usually taken 1 or 2 hours before bedtime. After taking this medication, limit your activities to those needed to prepare for bed. Some products may be chewed or dissolved in the mouth before swallowing. Some tablets or capsules must be swallowed whole; do not cut, crush or chew. Follow the directions on the package labeling, or take as directed by your care team. Do not take this medication more often than directed. Talk to your care team the use of this medication in children. Special care may be needed. This medication is not recommended for use in children without a prescription. Overdosage: If you think you have taken too much of this medicine contact a poison control center or emergency room at once. NOTE: This medicine is only for you. Do not share this medicine with others. What if I miss a dose? If you miss taking your dose at the usual time, skip that dose. If it is almost time for your next dose, take only that dose. Do not take double or extra doses. What may interact with this medication? Do not take this medication with any of the following: Fluvoxamine Ramelteon Tasimelteon This medication may also interact with the following: Alcohol Caffeine Carbamazepine Certain antibiotics like  ciprofloxacin Certain medications for depression, anxiety, or psychotic disturbances Cimetidine Female hormones, like estrogens and birth control pills, patches, rings, or injections Methoxsalen Nifedipine Other herbal or dietary supplements Other medications for sleep Phenobarbital Rifampin Smoking tobacco Tamoxifen Warfarin This list may not describe all possible interactions. Give your health care provider a list of all the medicines, herbs, non-prescription drugs, or dietary supplements you use. Also tell them if you smoke, drink alcohol, or use illegal drugs. Some items may interact with your medicine. What should I watch for while using this medication? See your care team if your symptoms do not get better or if they get worse. Do not take this medication for more than 2 weeks unless your care team tells you to. You may get drowsy or dizzy. Do not drive, use machinery, or do anything that needs mental alertness until you know how this medication affects you. Do not stand or sit up quickly, especially if you are an older patient. This reduces the risk of dizzy or fainting spells. Alcohol may interfere with the effect of this medication. Avoid alcoholic drinks. After taking this medication, you may get up out of bed and do an activity that you do  not know you are doing. The next morning, you may have no memory of this. Activities include driving a car ("sleep-driving"), making and eating food, talking on the phone, sexual activity, and sleep-walking. Serious injuries have occurred. Call your care team right away if you find out you have done any of these activities. Do not take this medication if you have used alcohol that evening. Do not take it if you have taken another medication for sleep. The risk of doing these sleep-related activities is higher. Talk to your care team before you use this medication if you are currently being treated for an emotional, mental, or sleep problem. This  medication may interfere with your treatment. Herbal or dietary supplements are not regulated like medications. Rigid quality control standards are not required for dietary supplements. The purity and strength of these products can vary. The safety and effect of this dietary supplement for a certain disease or illness is not well known. This product is not intended to diagnose, treat, cure or prevent any disease. The Food and Drug Administration suggests the following to help consumers protect themselves: Always read product labels and follow directions. Natural does not mean a product is safe for humans to take. Look for products that include USP after the ingredient name. This means that the manufacturer followed the standards of the [place] Pharmacopoeia. Supplements made or sold by a nationally known food or drug company are more likely to be made under tight controls. You can write to the company for more information about how the product was made. What side effects may I notice from receiving this medication? Side effects that you should report to your care team as soon as possible: Allergic reactions--skin rash, itching, hives, swelling of the face, lips, tongue, or throat Mood and behavior changes--anxiety, nervousness, confusion, hallucinations, irritability, hostility, thoughts of suicide or self-harm, worsening mood, feelings of depression Side effects that usually do not require medical attention (report to your care team if they continue or are bothersome): Bedwetting in children Dizziness Drowsiness the day after use Headache Nausea This list may not describe all possible side effects. Call your doctor for medical advice about side effects. You may report side effects to FDA at 1-800-FDA-1088. Where should I keep my medication? Keep out of the reach of children. Store at room temperature or as directed on the package label. Protect from moisture. Throw away any unused medication after  the expiration date. NOTE: This sheet is a summary. It may not cover all possible information. If you have questions about this medicine, talk to your doctor, pharmacist, or health care provider.  2023 Elsevier/Gold Standard (2020-12-17 00:00:00)

## 2022-04-24 NOTE — Progress Notes (Signed)
Barnet [place] Martin,acting as a Education administrator for [name], FNP.,have documented all relevant documentation on the behalf of [name], FNP,as directed by  [name], FNP while in the presence of [name], [place].    Subjective:     Patient ID: Sarah Weaver , female    DOB: May 25, 1936 , 86 y.o.   MRN: [id number]   Chief Complaint  Patient presents with   Nausea    HPI  Patient presents today because she is having a few issues. She states she suddenly gets hot and then cools off, she also states that she feels a cramp in the top of her stomach she also feels nausea and sometimes dizzy. She states this has been going on since Monday. She states Monday she had a dizzy spell and it was so bad she couldn't see anything.  When changing positions she is having more dizziness. This is improved at this time.   BP Readings from Last 3 Encounters: 04/16/22 : 132/82 04/11/22 : 140/67 03/24/22 : 130/80  Blood sugars have averaged 123.  She has been feeling nervous more since after her heart attacked. She feels like the cars are going fast. Her family asked if she is having an anxiety attack. She does not want to go anywhere and stay for a long time. She feels like this has been ongoing for the last 2 years. She has not wanted to go out in the public.   She is due to have her echo on 8/19.        Past Medical History:  Diagnosis Date   Arthritis    "all over"   Chronic lower back pain    CKD Stage III 07/26/2018   Hx of hyperkalemia   Coronary artery disease    a. status post DES to the LAD 12/2015.   Glaucoma    HFpEF    Echocardiogram 12/2019: EF 60-65, no RWMA, mild LVH, Gr 1 DD, trivial MR, mild AoV sclerosis, no AS   Hyperkalemia    Hyperlipidemia    Hypertension    Mobitz (type) I (Wenckebach's) atrioventricular block 10/15/2021   Palpitations Zio Patch Monitor 5/21 NSR, avg HR 67, 1st degree AV block, intermittent Wenckebach during sleep, rare PACs/PVCs   Morbid  obesity (HCC)    Palpitations    Type II diabetes mellitus ([place])      Family History  Problem Relation Age of Onset   Heart disease Mother    Heart attack Mother    Hypertension Mother    Heart disease Sister    Cancer Sister    Stroke Sister      Current Outpatient Medications:    Accu-Chek Softclix Lancets lancets, USE UP TO 4 TIMES DAILY AS DIRECTED, Disp: 100 each, Rfl: 3   albuterol (VENTOLIN HFA) 108 (90 Base) MCG/ACT inhaler, Inhale 2 puffs into the lungs every 6 (six) hours as needed for wheezing or shortness of breath., Disp: 8 g, Rfl: 2   aspirin EC 81 MG tablet, Take 81 mg by mouth daily., Disp: , Rfl:    atorvastatin (LIPITOR) 80 MG tablet, TAKE 1 TABLET BY MOUTH EVERYDAY AT BEDTIME, Disp: 90 tablet, Rfl: 2   BD PEN NEEDLE NANO 2ND GEN 32G X 4 MM MISC, USE NIGHTLY AS DIRECTED, Disp: 100 each, Rfl: 3   blood glucose meter kit and supplies KIT, Dispense based on patient and insurance preference. Use up to four times daily as directed. (FOR ICD-9 250.00, 250.01)., Disp: 1 each, Rfl: 0  brinzolamide (AZOPT) 1 % ophthalmic suspension, Place 1 drop into both eyes 3 (three) times daily., Disp: , Rfl:    Cholecalciferol 125 MCG (5000 UT) TABS, Take 1 tablet by mouth daily., Disp: , Rfl:    empagliflozin (JARDIANCE) 10 MG TABS tablet, Take 1 tablet (10 mg total) by mouth daily before breakfast., Disp: 90 tablet, Rfl: 1   ezetimibe (ZETIA) 10 MG tablet, TAKE 1 TABLET BY MOUTH EVERY DAY, Disp: 90 tablet, Rfl: 3   furosemide (LASIX) 40 MG tablet, Take 1 tablet (40 mg total) by mouth daily., Disp: 90 tablet, Rfl: 3   insulin degludec (TRESIBA FLEXTOUCH) 100 UNIT/ML FlexTouch Pen, Inject 15 Units into the skin daily., Disp: 15 mL, Rfl: 1   Latanoprostene Bunod 0.024 % SOLN, Apply 1 drop to eye daily., Disp: , Rfl:    LINZESS 145 MCG CAPS capsule, TAKE 1 CAPSULE (145 MCG TOTAL) BY MOUTH DAILY BEFORE BREAKFAST. FOR CONSTIPATION (Patient taking differently: Patient is taking before  breakfast every couple of days), Disp: 90 capsule, Rfl: 0   losartan (COZAAR) 50 MG tablet, Take 50 mg by mouth at bedtime., Disp: , Rfl:    nebivolol (BYSTOLIC) 2.5 MG tablet, Take 1 tablet (2.5 mg total) by mouth daily., Disp: 90 tablet, Rfl: 1   nystatin (NYSTATIN) powder, Apply 1 application topically 3 (three) times daily., Disp: 15 g, Rfl: 0   ONETOUCH VERIO test strip, CHECK BLOOD SUGARS ONCE DAILY, Disp: 50 strip, Rfl: 11   timolol (BETIMOL) 0.5 % ophthalmic solution, 1 drop 2 (two) times daily., Disp: , Rfl:    traMADol-acetaminophen (ULTRACET) 37.5-325 MG tablet, TAKE 1 TABLET BY MOUTH EVERY 6 HOURS AS NEEDED FOR MODERATE BACK PAIN, Disp: 20 tablet, Rfl: 0   levothyroxine (SYNTHROID) 50 MCG tablet, TAKE 1 TABLET BY MOUTH EVERY DAY BEFORE BREAKFAST, Disp: 90 tablet, Rfl: 1   Magnesium 250 MG TABS, Take 1 tablet (250 mg total) by mouth every evening. Take with evening meal, Disp: 90 tablet, Rfl: 1   OZEMPIC, 2 MG/DOSE, 8 MG/3ML SOPN, INJECT 0.5 MG INTO THE SKIN ONCE A WEEK., Disp: 3 mL, Rfl: 1   Allergies  Allergen Reactions   Carvedilol Cough    Pt reports causes her to cough   Lisinopril Cough    REPORTS CAUSES DRY COUGH     Review of Systems  Constitutional: Negative.   HENT:         Reports having dysphagia when eating.  Respiratory: Negative.    Cardiovascular: Negative.   Gastrointestinal: Negative.   Neurological: Negative.   Psychiatric/Behavioral:  Positive for sleep disturbance (awakens during the night). The patient is nervous/anxious.   All other systems reviewed and are negative.    Today's Vitals   04/24/22 1412  Temp: 98.4 F (36.9 C)  TempSrc: Oral  Weight: 290 lb 3.2 oz (131.6 kg)  Height: 5' 4"  (1.626 m)  PainSc: 0-No pain   Body mass index is 49.81 kg/m.  Wt Readings from Last 3 Encounters:  04/24/22 290 lb 3.2 oz (131.6 kg)  04/16/22 290 lb (131.5 kg)  03/24/22 291 lb (132 kg)     Objective:  Physical Exam Vitals reviewed.   Constitutional:      General: She is not in acute distress.    Appearance: Normal appearance. She is well-developed. She is obese.  HENT:     Head: Normocephalic and atraumatic.  Eyes:     Pupils: Pupils are equal, round, and reactive to light.  Cardiovascular:  Rate and Rhythm: Normal rate and regular rhythm.     Pulses: Normal pulses.     Heart sounds: Normal heart sounds. No murmur heard. Pulmonary:     Effort: Pulmonary effort is normal. No respiratory distress.     Breath sounds: Normal breath sounds. No wheezing.  Musculoskeletal:        General: Normal range of motion.  Skin:    General: Skin is warm and dry.     Capillary Refill: Capillary refill takes less than 2 seconds.  Neurological:     General: No focal deficit present.     Mental Status: She is alert and oriented to person, place, and time.     Cranial Nerves: No cranial nerve deficit.     Motor: No weakness.  Psychiatric:        Mood and Affect: Mood normal.        Thought Content: Thought content normal.        Judgment: Judgment normal.         Assessment And Plan:     1. Nausea Comments: Intermittent nausea, she does not want another medication. Can get over the counter peppermint. Orthostats are normal  2. Hand cramps Comments: Encouraged to stay well hydrated and to incorporate magnesium supplement - Magnesium 250 MG TABS; Take 1 tablet (250 mg total) by mouth every evening. Take with evening meal  Dispense: 90 tablet; Refill: 1  3. Anxiety Comments: Encouraged to take magnesium supplement, she is not interested in a Rx medication at this time.      Patient was given opportunity to ask questions. Patient verbalized understanding of the plan and was able to repeat key elements of the plan. All questions were answered to their satisfaction.  [name], FNP   I, [name], FNP, have reviewed all documentation for this visit. The documentation on 04/24/22 for the exam, diagnosis,  procedures, and orders are all accurate and complete.   IF YOU HAVE BEEN REFERRED TO A SPECIALIST, IT MAY TAKE 1-2 WEEKS TO SCHEDULE/PROCESS THE REFERRAL. IF YOU HAVE NOT HEARD FROM US/SPECIALIST IN TWO WEEKS, PLEASE GIVE [place] A CALL AT [phone].   THE PATIENT IS ENCOURAGED TO PRACTICE SOCIAL DISTANCING DUE TO THE COVID-19 PANDEMIC.

## 2022-04-27 ENCOUNTER — Other Ambulatory Visit: Payer: Self-pay | Admitting: Nurse Practitioner

## 2022-04-27 DIAGNOSIS — E782 Mixed hyperlipidemia: Secondary | ICD-10-CM

## 2022-04-30 ENCOUNTER — Other Ambulatory Visit: Payer: Self-pay | Admitting: Nurse Practitioner

## 2022-05-02 ENCOUNTER — Ambulatory Visit (HOSPITAL_COMMUNITY): Payer: Medicare Other | Attending: Cardiology

## 2022-05-02 DIAGNOSIS — R0609 Other forms of dyspnea: Secondary | ICD-10-CM

## 2022-05-02 DIAGNOSIS — I2510 Atherosclerotic heart disease of native coronary artery without angina pectoris: Secondary | ICD-10-CM | POA: Diagnosis not present

## 2022-05-02 DIAGNOSIS — I5032 Chronic diastolic (congestive) heart failure: Secondary | ICD-10-CM

## 2022-05-02 LAB — ECHOCARDIOGRAM COMPLETE
Area-P 1/2: 2.11 cm2
S' Lateral: 3 cm

## 2022-05-16 ENCOUNTER — Ambulatory Visit: Payer: Medicare Other

## 2022-05-16 DIAGNOSIS — I10 Essential (primary) hypertension: Secondary | ICD-10-CM

## 2022-05-16 DIAGNOSIS — E1122 Type 2 diabetes mellitus with diabetic chronic kidney disease: Secondary | ICD-10-CM

## 2022-05-16 NOTE — Progress Notes (Signed)
Chronic Care Management Pharmacy Note  05/20/2022 Name:  Sarah Weaver MRN:  [id number] DOB:  December 15, 1935  Summary: [name] reports that she is doing well, she is proud of her A1c going down by 1 point.   Recommendations/Changes made from today's visit: -Recommend patient continue current medication regimen.   Plan: Patient to continue current medication regimen    Subjective: Sarah Weaver is an 85 y.o. year old female who is a primary patient of Sarah Weaver, [place].  The CCM team was consulted for assistance with disease management and care coordination needs.    Engaged with patient by telephone for follow up visit in response to provider referral for pharmacy case management and/or care coordination services.   Consent to Services:  The patient was given information about Chronic Care Management services, agreed to services, and gave verbal consent prior to initiation of services.  Please see initial visit note for detailed documentation.   Patient Care Team: Sarah Brine, FNP as PCP - General (General Practice) Sarah Bergeron, MD as PCP - Cardiology (Cardiology) Sarah Weaver, [facility] (Pharmacist) Sarah Weaver as Physician Assistant (Cardiology)  Recent office visits: 04/24/2022 PCP OV 12/26/2021 ED  OV  Recent consult visits:  04/16/2022 Cardiology   Hospital visits: 04/11/2022 ED Visit  12/26/2021 ED Visit    Objective:  Lab Results  Component Value Date   CREATININE 1.58 (H) 04/11/2022   BUN 27 (H) 04/11/2022   EGFR 37 (L) 03/24/2022   GFRNONAA 32 (L) 04/11/2022   GFRAA 34 (L) 11/06/2020   NA 139 04/11/2022   K 3.9 04/11/2022   CALCIUM 9.5 04/11/2022   CO2 27 04/11/2022   GLUCOSE 191 (H) 04/11/2022    Lab Results  Component Value Date/Time   HGBA1C 6.9 (H) 03/24/2022 03:35 PM   HGBA1C 7.0 (H) 11/18/2021 04:22 PM    Last diabetic Eye exam:  Lab Results  Component Value Date/Time   HMDIABEYEEXA No  Retinopathy 10/31/2021 12:00 AM    Last diabetic Foot exam: No results found for: "HMDIABFOOTEX"   Lab Results  Component Value Date   CHOL 117 03/24/2022   HDL 50 03/24/2022   LDLCALC 53 03/24/2022   TRIG 69 03/24/2022   CHOLHDL 2.3 03/24/2022       Latest Ref Rng & Units 03/24/2022    3:35 PM 06/26/2021    5:06 PM 11/06/2020   12:02 PM  Hepatic Function  Total Protein 6.0 - 8.5 g/dL  6.9  7.1   Albumin 3.7 - 4.7 g/dL 4.2  4.3  4.1   AST 0 - 40 IU/L  24  23   ALT 0 - 32 IU/L  34  21   Alk Phosphatase 44 - 121 IU/L  98  95   Total Bilirubin 0.0 - 1.2 mg/dL  0.4  0.5     Lab Results  Component Value Date/Time   TSH 1.840 03/24/2022 03:35 PM   TSH 1.770 03/19/2021 12:44 PM   FREET4 1.32 05/05/2019 02:34 PM       Latest Ref Rng & Units 04/11/2022    2:56 AM 10/15/2021    9:20 AM 07/11/2020   11:31 AM  CBC  WBC 4.0 - 10.5 K/uL 4.7  3.8  4.2   Hemoglobin 12.0 - 15.0 g/dL 13.1  12.7  12.5   Hematocrit 36.0 - 46.0 % 40.2  38.2  39.3   Platelets 150 - 400 K/uL 189  197  204     No  results found for: "VD25OH"  Clinical ASCVD: Yes       07/25/2021   11:45 AM 07/11/2020   10:12 AM 08/25/2019    9:47 AM  Depression screen PHQ 2/9  Decreased Interest 0 0 0  Down, Depressed, Hopeless 0 0 0  PHQ - 2 Score 0 0 0  Altered sleeping  0   Tired, decreased energy  0   Change in appetite  0   Feeling bad or failure about yourself   0   Trouble concentrating  0   Moving slowly or fidgety/restless  0   Suicidal thoughts  0   PHQ-9 Score  0   Difficult doing work/chores  Not difficult at all       Social History   Tobacco Use  Smoking Status Never  Smokeless Tobacco Never   BP Readings from Last 3 Encounters:  04/16/22 132/82  04/11/22 140/67  03/24/22 130/80   Pulse Readings from Last 3 Encounters:  04/16/22 74  04/11/22 65  03/24/22 75   Wt Readings from Last 3 Encounters:  04/24/22 290 lb 3.2 oz (131.6 kg)  04/16/22 290 lb (131.5 kg)  03/24/22 291 lb  (132 kg)   BMI Readings from Last 3 Encounters:  04/24/22 49.81 kg/m  04/16/22 49.78 kg/m  03/24/22 49.95 kg/m    Assessment/Interventions: Review of patient past medical history, allergies, medications, health status, including review of consultants reports, laboratory and other test data, was performed as part of comprehensive evaluation and provision of chronic care management services.   SDOH:  (Social Determinants of Health) assessments and interventions performed: No SDOH Interventions    Flowsheet Row Chronic Care Management from 01/10/2021 in [facility] Clinical Support from 07/11/2020 in [place] from 05/05/2019 in [place] Interventions Intervention Not Indicated -- --  Housing Interventions Intervention Not Indicated -- --  Transportation Interventions Intervention Not Indicated -- --  Depression Interventions/Treatment  -- JJK0-9 Score <4 Follow-up Not Indicated PHQ2-9 Score <4 Follow-up Not Indicated      [place]: No Food Insecurity (07/25/2021)  Housing: Low Risk  (01/10/2021)  Transportation Needs: No Transportation Needs (07/25/2021)  Depression (PHQ2-9): Low Risk  (07/25/2021)  Financial Resource Strain: Low Risk  (07/25/2021)  Physical Activity: Inactive (07/25/2021)  Social Connections: Moderately Isolated (08/10/2018)  Stress: No Stress Concern Present (07/25/2021)  Tobacco Use: Low Risk  (04/24/2022)    CCM Care Plan  Allergies  Allergen Reactions   Carvedilol Cough    Pt reports causes her to cough   Lisinopril Cough    REPORTS CAUSES DRY COUGH    Medications Reviewed Today     Reviewed by Sarah Weaver, [place] (Pharmacist) on 05/16/22 at [place]  Med List Status: <None>   Medication Order Taking? Sig Documenting Provider Last Dose Status Informant  Accu-Chek Softclix Lancets lancets  [id number] No USE UP TO 4 TIMES DAILY AS DIRECTED Sarah Brine, FNP Taking Active   albuterol (VENTOLIN HFA) 108 (90 Base) MCG/ACT inhaler [id number] No Inhale 2 puffs into the lungs every 6 (six) hours as needed for wheezing or shortness of breath. Sarah Castilla, NP Taking Active   aspirin EC 81 MG tablet [id number] No Take 81 mg by mouth daily. [provider] Taking Active Self  atorvastatin (LIPITOR) 80 MG tablet [id number] No TAKE 1 TABLET BY MOUTH EVERYDAY AT BEDTIME Sarah Brine, FNP Taking Active  BD PEN NEEDLE NANO 2ND GEN 32G X 4 MM MISC [id number] No USE NIGHTLY AS DIRECTED Sarah Brine, FNP Taking Active   blood glucose meter kit and supplies KIT [id number] No Dispense based on patient and insurance preference. Use up to four times daily as directed. (FOR ICD-9 250.00, 250.01). Sarah Brine, FNP Taking Active   brinzolamide (AZOPT) 1 % ophthalmic suspension [id number] No Place 1 drop into both eyes 3 (three) times daily. [provider] Taking Active Self  Cholecalciferol 125 MCG (5000 UT) TABS [id number] No Take 1 tablet by mouth daily. [provider] Taking Active   empagliflozin (JARDIANCE) 10 MG TABS tablet [id number] No Take 1 tablet (10 mg total) by mouth daily before breakfast. Sarah Bergeron, MD Taking Active   ezetimibe (ZETIA) 10 MG tablet [id number] No TAKE 1 TABLET BY MOUTH EVERY DAY Sarah Bergeron, MD Taking Active   furosemide (LASIX) 40 MG tablet [id number] No Take 1 tablet (40 mg total) by mouth daily. Sarah Bergeron, MD Taking Active   insulin degludec [facility]) 100 UNIT/ML FlexTouch Pen [id number] No Inject 15 Units into the skin daily. Sarah Brine, FNP Taking Active   Latanoprostene Bunod 0.024 % SOLN [id number] No Apply 1 drop to eye daily. [provider] Taking Active Self  levothyroxine (SYNTHROID) 50 MCG tablet [id number]  TAKE 1 TABLET BY MOUTH EVERY DAY BEFORE Evelena Peat, FNP  Active    LINZESS 145 MCG CAPS capsule [id number] No TAKE 1 CAPSULE (145 MCG TOTAL) BY MOUTH DAILY BEFORE BREAKFAST. FOR CONSTIPATION  Patient taking differently: Patient is taking before breakfast every couple of days   Sarah Brine, FNP Taking Active   losartan (COZAAR) 50 MG tablet [id number] No Take 50 mg by mouth at bedtime. Rexene Agent, MD Taking Active   Magnesium 250 MG TABS [id number]  Take 1 tablet (250 mg total) by mouth every evening. Take with evening meal Sarah Brine, FNP  Active   nebivolol (BYSTOLIC) 2.5 MG tablet [id number] No Take 1 tablet (2.5 mg total) by mouth daily. Sarah Bergeron, MD Taking Active   nystatin (NYSTATIN) powder [id number] No Apply 1 application topically 3 (three) times daily. Sarah Brine, FNP Taking Active   [facility] VERIO test strip [id number] No CHECK BLOOD SUGARS ONCE DAILY Rodriguez-Southworth, Sunday Spillers, PA-C Taking Active   OZEMPIC, 2 MG/DOSE, 8 MG/3ML SOPN [id number]  INJECT 0.5 MG INTO THE SKIN ONCE A WEEK. Sarah Brine, FNP  Active   timolol (BETIMOL) 0.5 % ophthalmic solution [id number] No 1 drop 2 (two) times daily. [provider] Taking Active   traMADol-acetaminophen (ULTRACET) 37.5-325 MG tablet [id number] No TAKE 1 TABLET BY MOUTH EVERY 6 HOURS AS NEEDED FOR MODERATE BACK PAIN Sarah Brine, FNP Taking Active             Patient Active Problem List   Diagnosis Date Noted   Anxiety 04/24/2022   Hand cramps 04/24/2022   Nausea 04/24/2022   Mobitz (type) I (Wenckebach's) atrioventricular block 10/15/2021   Heart palpitations 10/14/2021   Class 3 severe obesity due to excess calories with serious comorbidity in adult [facility]) 08/25/2019   Diabetes mellitus due to underlying condition with stage 3a chronic kidney disease, with long-term current use of insulin ([place]) 06/16/2019   CAD (coronary artery disease) 07/26/2018   (HFpEF) heart failure with preserved ejection fraction ([place]) 07/26/2018   Chronic kidney disease (CKD), stage III  (moderate) (HCC) 07/26/2018   Chest pain, moderate coronary artery risk 12/20/2015   DM2 (diabetes  mellitus, type 2) ([place]) 12/20/2015   HTN (hypertension) 12/20/2015   HLD (hyperlipidemia) 12/20/2015   Hx of NSTEMI in 2017    Acute renal failure ([place])    Cholelithiasis 04/14/2011    Immunization History  Administered Date(s) Administered   Fluad Quad(high Dose 65+) 07/11/2020, 06/26/2021   Influenza, High Dose Seasonal PF 07/29/2017, 07/08/2018, 06/23/2019   Influenza-Unspecified 06/15/2018   PFIZER(Purple Top)SARS-COV-2 Vaccination 09/26/2020, 10/17/2020   Pneumococcal Polysaccharide-23 04/24/2011, 07/29/2017   Tdap 09/22/2018    Conditions to be addressed/monitored:  Hypertension and Diabetes  Care Plan : [place]  Updates made by Sarah Weaver, [place] since 05/20/2022 12:00 AM     Problem: HTN, DM II   Priority: High     Long-Range Goal: Patient Stated   Start Date: 12/12/2020  Recent Progress: On track  Priority: High  Note:   Current Barriers:  Unable to independently monitor therapeutic efficacy  Pharmacist Clinical Goal(s):  Patient will achieve adherence to monitoring guidelines and medication adherence to achieve therapeutic efficacy through collaboration with PharmD and provider.   Interventions: 1:1 collaboration with Sarah Brine, FNP regarding development and update of comprehensive plan of care as evidenced by provider attestation and co-signature Inter-disciplinary care team collaboration (see longitudinal plan of care) Comprehensive medication review performed; medication list updated in electronic medical record  Hypertension (BP goal <140/90) -Controlled -Current treatment: Losartan 50 mg tablet once per day Appropriate, Effective, Safe, Accessible Nebivolol 2.5 mg tablet once per day Appropriate, Effective, Safe, Accessible -Current home readings: 05/16/2022 - 138/80 -Current dietary habits: she does not use any salt on her food   -Denies hypotensive/hypertensive symptoms -Educated on Exercise goal of 150 minutes per week; Importance of home blood pressure monitoring; Proper BP monitoring technique; -Counseled to monitor BP at home at least once or twice per month, document, and provide log at future appointments -We discussed the importance of keeping her log book, and the signs and symptoms of low blood pressure.  -Recommended to continue current medication  Diabetes (A1c goal <8%) -Controlled -Current medications: Tresiba 100 unit/ml - inject 15 units daily Appropriate, Effective, Safe, Accessible Jardiance 10 mg tablet once per day Appropriate, Effective, Safe, Accessible Ozempic 0.5mg  once per week Appropriate, Effective, Safe, Accessible -Current home glucose readings fasting glucose: 118, 129  -Denies hypoglycemic/hyperglycemic symptoms  -Current meal patterns: she does not have the taste for unhealthy food.  breakfast: she is eating breakfast   lunch: she will sometime have a smoothie  dinner: she is eating plenty of vegetables  drinks: drinking water at home  -Current exercise: she is walking around the house  -Educated on Prevention and management of hypoglycemic episodes; Benefits of routine self-monitoring of blood sugar; -Counseled to check feet daily and get yearly eye exams -Recommended to continue current medication  Patient Goals/Self-Care Activities Patient will:  - take medications as prescribed as evidenced by patient report and record review  Follow Up Plan: The patient has been provided with contact information for the care management team and has been advised to call with any health related questions or concerns.       Medication Assistance: None required.  Patient affirms current coverage meets needs.  Compliance/Adherence/Medication fill history: Care Gaps: COVID-19 Vaccine  Influenza Vaccine   Star-Rating Drugs: Atorvastatin 80 mg tablet  Losartan 50 mg tablet Ozempic 2  mg   Patient's preferred pharmacy is:  [id number] - [place], [place] - [address]  [place] [address] Phone: [phone] Fax: [phone]  [facility] [place], [place] [address] [place] [address] [place] [address] Phone: [phone] Fax: [phone]  Uses pill box? Yes Pt endorses 90 % compliance  We discussed: Benefits of medication synchronization, packaging and delivery as well as enhanced pharmacist oversight with [employer]. Patient decided to: Continue current medication management strategy  Care Plan and Follow Up Patient Decision:  Patient agrees to Care Plan and Follow-up.  Plan: The patient has been provided with contact information for the care management team and has been advised to call with any health related questions or concerns.   Orlando Penner, CPP, PharmD Clinical Pharmacist Practitioner [facility] [phone]

## 2022-05-20 NOTE — Patient Instructions (Signed)
Visit Information It was great speaking with you today!  Please let me know if you have any questions about our visit.   Goals Addressed             This Visit's Progress    Manage My Medicine       Timeframe:  Long-Range Goal Priority:  High Start Date:                             Expected End Date:                       Follow Up Date: 07/04/2022   In Progress: - call for medicine refill 2 or 3 days before it runs out - call if I am sick and can't take my medicine - keep a list of all the medicines I take; vitamins and herbals too - use a pillbox to sort medicine - use an alarm clock or phone to remind me to take my medicine    Why is this important?   These steps will help you keep on track with your medicines.           Patient Care Plan: CCM Pharmacy Care Plan     Problem Identified: HTN, DM II   Priority: High     Long-Range Goal: Patient Stated   Start Date: 12/12/2020  Recent Progress: On track  Priority: High  Note:   Current Barriers:  Unable to independently monitor therapeutic efficacy  Pharmacist Clinical Goal(s):  Patient will achieve adherence to monitoring guidelines and medication adherence to achieve therapeutic efficacy through collaboration with PharmD and provider.   Interventions: 1:1 collaboration with [name], FNP regarding development and update of comprehensive plan of care as evidenced by provider attestation and co-signature Inter-disciplinary care team collaboration (see longitudinal plan of care) Comprehensive medication review performed; medication list updated in electronic medical record  Hypertension (BP goal <140/90) -Controlled -Current treatment: Losartan 50 mg tablet once per day Appropriate, Effective, Safe, Accessible Nebivolol 2.5 mg tablet once per day Appropriate, Effective, Safe, Accessible -Current home readings: 05/16/2022 - 138/80 -Current dietary habits: she does not use any salt on her food  -Denies  hypotensive/hypertensive symptoms -Educated on Exercise goal of 150 minutes per week; Importance of home blood pressure monitoring; Proper BP monitoring technique; -Counseled to monitor BP at home at least once or twice per month, document, and provide log at future appointments -We discussed the importance of keeping her log book, and the signs and symptoms of low blood pressure.  -Recommended to continue current medication  Diabetes (A1c goal <8%) -Controlled -Current medications: Tresiba 100 unit/ml - inject 15 units daily Appropriate, Effective, Safe, Accessible Jardiance 10 mg tablet once per day Appropriate, Effective, Safe, Accessible Ozempic 0.5mg  once per week Appropriate, Effective, Safe, Accessible -Current home glucose readings fasting glucose: 118, 129  -Denies hypoglycemic/hyperglycemic symptoms  -Current meal patterns: she does not have the taste for unhealthy food.  breakfast: she is eating breakfast   lunch: she will sometime have a smoothie  dinner: she is eating plenty of vegetables  drinks: drinking water at home  -Current exercise: she is walking around the house  -Educated on Prevention and management of hypoglycemic episodes; Benefits of routine self-monitoring of blood sugar; -Counseled to check feet daily and get yearly eye exams -Recommended to continue current medication  Patient Goals/Self-Care Activities Patient will:  - take medications as prescribed as evidenced  by patient report and record review  Follow Up Plan: The patient has been provided with contact information for the care management team and has been advised to call with any health related questions or concerns.      Patient agreed to services and verbal consent obtained.   The patient verbalized understanding of instructions, educational materials, and care plan provided today and agreed to receive a mailed copy of patient instructions, educational materials, and care plan.   Cherylin Mylar, PharmD Clinical Pharmacist [facility] [phone]

## 2022-05-21 ENCOUNTER — Telehealth: Payer: Medicare Other

## 2022-06-12 ENCOUNTER — Other Ambulatory Visit: Payer: Self-pay

## 2022-06-12 MED ORDER — FUROSEMIDE 40 MG PO TABS: 40.0000 mg | ORAL_TABLET | Freq: Every day | ORAL | 3 refills | Status: DC

## 2022-06-12 MED ORDER — NEBIVOLOL HCL 2.5 MG PO TABS: 2.5000 mg | ORAL_TABLET | Freq: Every day | ORAL | 3 refills | Status: DC

## 2022-06-12 NOTE — Addendum Note (Signed)
Addended by: Carter Kitten D on: 06/12/2022 08:18 AM   Modules accepted: Orders

## 2022-06-18 ENCOUNTER — Ambulatory Visit: Payer: Medicare Other | Admitting: Cardiology

## 2022-07-02 ENCOUNTER — Telehealth: Payer: Self-pay

## 2022-07-02 NOTE — Chronic Care Management (AMB) (Signed)
    Called Sarah Weaver, No answer, left message of appointment on 07-04-2022 at 9:00 via telephone visit with Orlando Penner, Pharm D. Notified to have all medications, supplements, blood pressure and/or blood sugar logs available during appointment and to return call if need to reschedule.   Care Gaps: Shingrix overdue Covid booster overdue Flu vaccine overdue  Star Rating Drug: Ozempic 0.5 mg- Last filled 04-28-2022 [facility] Losartan 50 mg- Last filled 04-19-2022 [facility] Jardiance 10 mg- Last filled 05-08-2022 [facility] Atorvastatin 80 mg- Last filled [facility]-30-2022 [facility]  Any gaps in medications fill history? No  [place] Pharmacist Assistant [phone]

## 2022-07-04 ENCOUNTER — Telehealth: Payer: Self-pay

## 2022-07-04 ENCOUNTER — Ambulatory Visit (INDEPENDENT_AMBULATORY_CARE_PROVIDER_SITE_OTHER): Payer: Medicare Other

## 2022-07-04 DIAGNOSIS — I10 Essential (primary) hypertension: Secondary | ICD-10-CM

## 2022-07-04 DIAGNOSIS — E1122 Type 2 diabetes mellitus with diabetic chronic kidney disease: Secondary | ICD-10-CM

## 2022-07-04 MED ORDER — OZEMPIC (0.25 OR 0.5 MG/DOSE) 2 MG/3ML ~~LOC~~ SOPN: 0.5000 mg | PEN_INJECTOR | SUBCUTANEOUS | 2 refills | Status: AC

## 2022-07-04 NOTE — Progress Notes (Signed)
Chronic Care Management Pharmacy Note  07/08/2022 Name:  Sarah Weaver MRN:  [id number] DOB:  [birth year]  Summary: Patient reports that she has never had any problems with her medications before, her Ozempic dose was increased to 2 mg once per week and she prefers to take the original dose.   Recommendations/Changes made from today's visit: Recommend patients Ozempic be changed back to 0.5 mg once per week Recommend patients receive shingrix vaccine, COVID-19 booster and influenza vaccine  Plan: Discontinue Ozempic 2 mg and decrease to 0.5 mg once per week [name] planning on receiving COVID-19 Booster and influenza vaccine.  She is going to continue to do research on the shingrix vaccine.    Subjective: Sarah Weaver is an 86 y.o. year old female who is a primary patient of [name], [place].  The CCM team was consulted for assistance with disease management and care coordination needs.    Engaged with patient by telephone for follow up visit in response to provider referral for pharmacy case management and/or care coordination services.   Consent to Services:  The patient was given information about Chronic Care Management services, agreed to services, and gave verbal consent prior to initiation of services.  Please see initial visit note for detailed documentation.   Patient Care Team: [name], FNP as PCP - General (General Practice) Freada Bergeron, MD as PCP - Cardiology (Cardiology) Mayford Knife, [facility] (Pharmacist) Sharmon Revere as Physician Assistant (Cardiology)  Recent office visits: 04/24/2022 PCP OV   Recent consult visits: 04/16/2022 Cardiology [facility] visits: None in previous 6 months   Objective:  Lab Results  Component Value Date   CREATININE 1.58 (H) 04/11/2022   BUN 27 (H) 04/11/2022   EGFR 37 (L) 03/24/2022   GFRNONAA 32 (L) 04/11/2022   GFRAA 34 (L) 11/06/2020   NA 139 04/11/2022   K  3.9 04/11/2022   CALCIUM 9.5 04/11/2022   CO2 27 04/11/2022   GLUCOSE 191 (H) 04/11/2022    Lab Results  Component Value Date/Time   HGBA1C 6.9 (H) 03/24/2022 03:35 PM   HGBA1C 7.0 (H) 11/18/2021 04:22 PM    Last diabetic Eye exam:  Lab Results  Component Value Date/Time   HMDIABEYEEXA No Retinopathy 10/31/2021 12:00 AM    Last diabetic Foot exam: No results found for: "HMDIABFOOTEX"   Lab Results  Component Value Date   CHOL 117 03/24/2022   HDL 50 03/24/2022   LDLCALC 53 03/24/2022   TRIG 69 03/24/2022   CHOLHDL 2.3 03/24/2022       Latest Ref Rng & Units 03/24/2022    3:35 PM 06/26/2021    5:06 PM 11/06/2020   12:02 PM  Hepatic Function  Total Protein 6.0 - 8.5 g/dL  6.9  7.1   Albumin 3.7 - 4.7 g/dL 4.2  4.3  4.1   AST 0 - 40 IU/L  24  23   ALT 0 - 32 IU/L  34  21   Alk Phosphatase 44 - 121 IU/L  98  95   Total Bilirubin 0.0 - 1.2 mg/dL  0.4  0.5     Lab Results  Component Value Date/Time   TSH 1.840 03/24/2022 03:35 PM   TSH 1.770 03/19/2021 12:44 PM   FREET4 1.32 05/05/2019 02:34 PM       Latest Ref Rng & Units 04/11/2022    2:56 AM 10/15/2021    9:20 AM 07/11/2020   11:31 AM  CBC  WBC  4.0 - 10.5 K/uL 4.7  3.8  4.2   Hemoglobin 12.0 - 15.0 g/dL 13.1  12.7  12.5   Hematocrit 36.0 - 46.0 % 40.2  38.2  39.3   Platelets 150 - 400 K/uL 189  197  204     No results found for: "VD25OH"  Clinical ASCVD: Yes  The ASCVD Risk score (Arnett DK, et al., 2019) failed to calculate for the following reasons:   The 2019 ASCVD risk score is only valid for ages 63 to 81   The patient has a prior MI or stroke diagnosis       07/25/2021   11:45 AM 07/11/2020   10:12 AM 08/25/2019    9:47 AM  Depression screen PHQ 2/9  Decreased Interest 0 0 0  Down, Depressed, Hopeless 0 0 0  PHQ - 2 Score 0 0 0  Altered sleeping  0   Tired, decreased energy  0   Change in appetite  0   Feeling bad or failure about yourself   0   Trouble concentrating  0   Moving slowly  or fidgety/restless  0   Suicidal thoughts  0   PHQ-9 Score  0   Difficult doing work/chores  Not difficult at all      Social History   Tobacco Use  Smoking Status Never  Smokeless Tobacco Never   BP Readings from Last 3 Encounters:  04/16/22 132/82  04/11/22 140/67  03/24/22 130/80   Pulse Readings from Last 3 Encounters:  04/16/22 74  04/11/22 65  03/24/22 75   Wt Readings from Last 3 Encounters:  04/24/22 290 lb 3.2 oz (131.6 kg)  04/16/22 290 lb (131.5 kg)  03/24/22 291 lb (132 kg)   BMI Readings from Last 3 Encounters:  04/24/22 49.81 kg/m  04/16/22 49.78 kg/m  03/24/22 49.95 kg/m    Assessment/Interventions: Review of patient past medical history, allergies, medications, health status, including review of consultants reports, laboratory and other test data, was performed as part of comprehensive evaluation and provision of chronic care management services.   SDOH:  (Social Determinants of Health) assessments and interventions performed: Yes SDOH Interventions    Flowsheet Row Chronic Care Management from 01/10/2021 in [facility] Clinical Support from 07/11/2020 in [place] from 05/05/2019 in [place] Interventions Intervention Not Indicated -- --  Housing Interventions Intervention Not Indicated -- --  Transportation Interventions Intervention Not Indicated -- --  Depression Interventions/Treatment  -- TIR4-4 Score <4 Follow-up Not Indicated PHQ2-9 Score <4 Follow-up Not Indicated      [place]: No Food Insecurity (07/25/2021)  Housing: Low Risk  (01/10/2021)  Transportation Needs: No Transportation Needs (07/25/2021)  Depression (PHQ2-9): Low Risk  (07/25/2021)  Financial Resource Strain: Low Risk  (07/25/2021)  Physical Activity: Inactive (07/25/2021)  Social Connections: Moderately Isolated  (08/10/2018)  Stress: No Stress Concern Present (07/25/2021)  Tobacco Use: Low Risk  (04/24/2022)    CCM Care Plan  Allergies  Allergen Reactions   Carvedilol Cough    Pt reports causes her to cough   Lisinopril Cough    REPORTS CAUSES DRY COUGH    Medications Reviewed Today     Reviewed by Mayford Knife, [place] (Pharmacist) on 07/04/22 at [phone]  Med List Status: <None>   Medication Order Taking? Sig Documenting Provider Last Dose Status Informant  Accu-Chek Softclix Lancets lancets [id number]  USE UP TO 4 TIMES DAILY AS DIRECTED [name], FNP  Active   albuterol (VENTOLIN HFA) 108 (90 Base) MCG/ACT inhaler [id number]  Inhale 2 puffs into the lungs every 6 (six) hours as needed for wheezing or shortness of breath. Bary Castilla, NP  Active   aspirin EC 81 MG tablet [id number]  Take 81 mg by mouth daily. [provider]  Active Self  atorvastatin (LIPITOR) 80 MG tablet [id number]  TAKE 1 TABLET BY MOUTH EVERYDAY AT BEDTIME [name], FNP  Active   BD PEN NEEDLE NANO 2ND GEN 32G X 4 MM MISC [id number]  USE NIGHTLY AS DIRECTED [name], FNP  Active   blood glucose meter kit and supplies KIT [id number]  Dispense based on patient and insurance preference. Use up to four times daily as directed. (FOR ICD-9 250.00, 250.01). [name], FNP  Active   brinzolamide (AZOPT) 1 % ophthalmic suspension [id number]  Place 1 drop into both eyes 3 (three) times daily. [provider]  Active Self  Cholecalciferol 125 MCG (5000 UT) TABS [id number]  Take 1 tablet by mouth daily. [provider]  Active   empagliflozin (JARDIANCE) 10 MG TABS tablet [id number]  Take 1 tablet (10 mg total) by mouth daily before breakfast. Freada Bergeron, MD  Active   ezetimibe (ZETIA) 10 MG tablet [id number]  TAKE 1 TABLET BY MOUTH EVERY DAY Freada Bergeron, MD  Active   furosemide (LASIX) 40 MG tablet [id number]  Take 1 tablet (40 mg total) by mouth daily. Freada Bergeron,  MD  Active   insulin degludec (TRESIBA FLEXTOUCH) 100 UNIT/ML FlexTouch Pen [id number]  Inject 15 Units into the skin daily. [name], FNP  Active   Latanoprostene Bunod 0.024 % SOLN [id number]  Apply 1 drop to eye daily. [provider]  Active Self  levothyroxine (SYNTHROID) 50 MCG tablet [id number]  TAKE 1 TABLET BY MOUTH EVERY DAY BEFORE Evelena Peat, FNP  Active   LINZESS 145 MCG CAPS capsule [id number]  TAKE 1 CAPSULE (145 MCG TOTAL) BY MOUTH DAILY BEFORE BREAKFAST. FOR CONSTIPATION  Patient taking differently: Patient is taking before breakfast every couple of days   [name], FNP  Active   losartan (COZAAR) 50 MG tablet [id number] Yes Take 50 mg by mouth at bedtime. Rexene Agent, MD Taking Active   Magnesium 250 MG TABS [id number] Yes Take 1 tablet (250 mg total) by mouth every evening. Take with evening meal [name], FNP Taking Active   nebivolol (BYSTOLIC) 2.5 MG tablet [id number]  Take 1 tablet (2.5 mg total) by mouth daily. Freada Bergeron, MD  Active   nystatin (NYSTATIN) powder [id number]  Apply 1 application topically 3 (three) times daily. [name], FNP  Active   [facility] VERIO test strip [id number]  CHECK BLOOD SUGARS ONCE DAILY Rodriguez-[place], Sandrea Matte  Active   OZEMPIC, 2 MG/DOSE, 8 MG/3ML SOPN [id number]  INJECT 0.5 MG INTO THE SKIN ONCE A WEEK. [name], FNP  Active   timolol (BETIMOL) 0.5 % ophthalmic solution [id number]  1 drop 2 (two) times daily. [provider]  Active   traMADol-acetaminophen (ULTRACET) 37.5-325 MG tablet [id number]  TAKE 1 TABLET BY MOUTH EVERY 6 HOURS AS NEEDED FOR MODERATE BACK PAIN [name], FNP  Active             Patient Active Problem List   Diagnosis Date Noted   Anxiety 04/24/2022   Hand cramps 04/24/2022   Nausea 04/24/2022   [place] (type)  I [facility]) atrioventricular block 10/15/2021   Heart palpitations 10/14/2021   Class 3 severe obesity due to excess calories  with serious comorbidity in adult [facility]) 08/25/2019   Diabetes mellitus due to underlying condition with stage 3a chronic kidney disease, with long-term current use of insulin ([place]) 06/16/2019   CAD (coronary artery disease) 07/26/2018   (HFpEF) heart failure with preserved ejection fraction ([place]) 07/26/2018   Chronic kidney disease (CKD), stage III (moderate) (HCC) 07/26/2018   Chest pain, moderate coronary artery risk 12/20/2015   DM2 (diabetes mellitus, type 2) ([place]) 12/20/2015   HTN (hypertension) 12/20/2015   HLD (hyperlipidemia) 12/20/2015   Hx of NSTEMI in 2017    Acute renal failure ([place])    Cholelithiasis 04/14/2011    Immunization History  Administered Date(s) Administered   Fluad Quad(high Dose 65+) 07/11/2020, 06/26/2021   Influenza, High Dose Seasonal PF 07/29/2017, 07/08/2018, 06/23/2019   Influenza-Unspecified 06/15/2018   PFIZER(Purple Top)SARS-COV-2 Vaccination 09/26/2020, 10/17/2020   Pneumococcal Polysaccharide-23 04/24/2011, 07/29/2017   Tdap 09/22/2018    Conditions to be addressed/monitored:  Hypertension and Diabetes  Care Plan : [place]  Updates made by Mayford Knife, [place] since 07/08/2022 12:00 AM     Problem: HTN, DM II   Priority: High     Long-Range Goal: Disease Management   Start Date: 12/12/2020  Recent Progress: On track  Priority: High  Note:   Current Barriers:  Unable to self administer medications as prescribed Does not contact provider office for questions/concerns  Pharmacist Clinical Goal(s):  Patient will achieve adherence to monitoring guidelines and medication adherence to achieve therapeutic efficacy through collaboration with PharmD and provider.   Interventions: 1:1 collaboration with [name], FNP regarding development and update of comprehensive plan of care as evidenced by provider attestation and co-signature Inter-disciplinary care team collaboration (see longitudinal plan of care) Comprehensive  medication review performed; medication list updated in electronic medical record  Hypertension (BP goal <140/90) -Controlled -Current treatment: Losartan 50 mg tablet once per day Appropriate, Effective, Safe, Accessible Nebivilol 2.5 mg tablet daily  Appropriate, Effective, Safe, Accessible -Current home readings: 130/72 -Denies hypotensive/hypertensive symptoms -Educated on Daily salt intake goal < 2300 mg; Importance of home blood pressure monitoring; Proper BP monitoring technique; -Counseled to monitor BP at home at least three times per day, document, and provide log at future appointments -Recommended to continue current medication   Diabetes (A1c goal <8%) -Controlled -Current medications: Jardiance 10 mg tablet once per day Appropriate, Effective, Safe, Accessible Tresiba 100 unit/ml - injecting 15 units at night Appropriate, Effective, Safe, Accessible Ozempic 2 mg - 0.5 mg once per week Appropriate, Effective, Query Safe  -Current home glucose readings fasting glucose: 128 -Denies hypoglycemic/hyperglycemic symptoms -Current meal patterns: patient reports that her eating habits are doing well drinks: she is drinking plenty of water -Current exercise: she is very bus -Educated on Complications of diabetes including kidney damage, retinal damage, and cardiovascular disease; Prevention and management of hypoglycemic episodes; Benefits of routine self-monitoring of blood sugar; -Counseled to check feet daily and get yearly eye exams -[facility]ed [facility] and confirmed patient was given, 2 mg pen 5/8, 7/6, per pharmacist patient was dispensed pen with these directions, no extra instructions were given.  -Followed up with patient and she reports that her medication is on backorder and she has not received it yet.  -Collaborate with PCP and change patient to Ozempic 0.5 injection once per week and send to pharmacy for patient pickup.   Patient Goals/Self-Care Activities  Patient will:   - take medications as prescribed as evidenced by patient report and record review  Follow Up Plan: The patient has been provided with contact information for the care management team and has been advised to call with any health related questions or concerns.       Medication Assistance: None required.  Patient affirms current coverage meets needs.  Compliance/Adherence/Medication fill history: Care Gaps: Shingrix Vaccine COVID-19 Vaccine Influenza Vaccine  Star-Rating Drugs: Ozempic 0.5 mg  Atorvastatin 80 mg tablet Jardiance 10 mg tablet    Patient's preferred pharmacy is:  [id number] [place], [place] - [address] [place]Phone: [phone]: [phone] [facility] [address] [place] [place]Phone: [phone]: [phone] Uses pill box? Yes Pt endorses 95% compliance  We discussed: Benefits of medication synchronization, packaging and delivery as well as enhanced pharmacist oversight with [employer]. Patient decided to: Continue current medication management strategy  Care Plan and Follow Up Patient Decision:  Patient agrees to Care Plan and Follow-up.  Plan: The patient has been provided with contact information for the care management team and has been advised to call with any health related questions or concerns.   VOrlando Penner CPP, PharmD Clinical Pharmacist Practitioner [facility] [phone]

## 2022-07-04 NOTE — Patient Instructions (Signed)
Visit Information It was great speaking with you today!  Please let me know if you have any questions about our visit.   Goals Addressed             This Visit's Progress    Manage My Medicine       Timeframe:  Long-Range Goal Priority:  High Start Date:                             Expected End Date:                       Follow Up Date: 11/04/2022   In Progress: - call for medicine refill 2 or 3 days before it runs out - call if I am sick and can't take my medicine - keep a list of all the medicines I take; vitamins and herbals too - use a pillbox to sort medicine - use an alarm clock or phone to remind me to take my medicine    Why is this important?   These steps will help you keep on track with your medicines.  Notes: Medication changed back to Ozempic 0.5 mg once per week.           Patient Care Plan: CCM Pharmacy Care Plan     Problem Identified: HTN, DM II   Priority: High     Long-Range Goal: Patient Stated   Start Date: 12/12/2020  Recent Progress: On track  Priority: High  Note:   Current Barriers:  Unable to self administer medications as prescribed Does not contact provider office for questions/concerns  Pharmacist Clinical Goal(s):  Patient will achieve adherence to monitoring guidelines and medication adherence to achieve therapeutic efficacy through collaboration with PharmD and provider.   Interventions: 1:1 collaboration with [name], FNP regarding development and update of comprehensive plan of care as evidenced by provider attestation and co-signature Inter-disciplinary care team collaboration (see longitudinal plan of care) Comprehensive medication review performed; medication list updated in electronic medical record  Hypertension (BP goal <140/90) -Controlled -Current treatment: Losartan 50 mg tablet once per day Appropriate, Effective, Safe, Accessible Nebivilol 2.5 mg tablet daily Appropriate, Effective, Safe,  Accessible -Current home readings: 130/72 -Denies hypotensive/hypertensive symptoms -Educated on Daily salt intake goal < 2300 mg; Importance of home blood pressure monitoring; Proper BP monitoring technique; -Counseled to monitor BP at home at least three times per day, document, and provide log at future appointments -Recommended to continue current medication   Diabetes (A1c goal <8%) -Controlled -Current medications: Jardiance 10 mg tablet once per day Appropriate, Effective, Safe, Accessible Tresiba 100 unit/ml - injecting 15 units at night Appropriate, Effective, Safe, Accessible Ozempic 2 mg - once per week Appropriate, Effective, Query Safe  -Current home glucose readings fasting glucose: 128 -Denies hypoglycemic/hyperglycemic symptoms -Current meal patterns: patient reports that her eating habits are doing well drinks: she is drinking plenty of water -Current exercise: she is very bus -Educated on Complications of diabetes including kidney damage, retinal damage, and cardiovascular disease; Prevention and management of hypoglycemic episodes; Benefits of routine self-monitoring of blood sugar; -Patient reports that she was uncertain why her medication was changed to Ozempic 2 mg once per week during her last refill since she was doing well on Ozempic 0.5 mg. She tried to take the dose for one week and she did not like how it made her feel.  -Counseled to check feet daily and get yearly  eye exams -Recommended to continue current medication  Patient Goals/Self-Care Activities Patient will:  - take medications as prescribed as evidenced by patient report and record review  Follow Up Plan: The patient has been provided with contact information for the care management team and has been advised to call with any health related questions or concerns.        Patient agreed to services and verbal consent obtained.   The patient verbalized understanding of instructions,  educational materials, and care plan provided today and agreed to receive a mailed copy of patient instructions, educational materials, and care plan.   Orlando Penner, PharmD Clinical Pharmacist [facility] [phone]

## 2022-07-04 NOTE — Telephone Encounter (Signed)
Looked at patients dispense hx

## 2022-07-15 DIAGNOSIS — Z7985 Long-term (current) use of injectable non-insulin antidiabetic drugs: Secondary | ICD-10-CM | POA: Diagnosis not present

## 2022-07-15 DIAGNOSIS — E1159 Type 2 diabetes mellitus with other circulatory complications: Secondary | ICD-10-CM

## 2022-07-15 DIAGNOSIS — I10 Essential (primary) hypertension: Secondary | ICD-10-CM

## 2022-08-02 ENCOUNTER — Other Ambulatory Visit: Payer: Self-pay | Admitting: Nurse Practitioner

## 2022-08-06 ENCOUNTER — Other Ambulatory Visit: Payer: Self-pay | Admitting: Nurse Practitioner

## 2022-08-06 ENCOUNTER — Ambulatory Visit: Payer: Medicare Other

## 2022-08-10 ENCOUNTER — Other Ambulatory Visit: Payer: Self-pay | Admitting: Nurse Practitioner

## 2022-08-10 DIAGNOSIS — E782 Mixed hyperlipidemia: Secondary | ICD-10-CM

## 2022-08-12 ENCOUNTER — Other Ambulatory Visit: Payer: Self-pay

## 2022-08-12 MED ORDER — EMPAGLIFLOZIN 10 MG PO TABS: 10.0000 mg | ORAL_TABLET | Freq: Every day | ORAL | 2 refills | Status: DC

## 2022-08-13 ENCOUNTER — Ambulatory Visit (INDEPENDENT_AMBULATORY_CARE_PROVIDER_SITE_OTHER): Payer: Medicare Other

## 2022-08-13 VITALS — Ht 64.0 in | Wt 299.0 lb

## 2022-08-13 DIAGNOSIS — Z0000 Encounter for general adult medical examination without abnormal findings: Secondary | ICD-10-CM | POA: Diagnosis not present

## 2022-08-13 NOTE — Progress Notes (Signed)
I connected with Sarah Weaver today by telephone and verified that I am speaking with the correct person using two identifiers. Location patient: home Location provider: work Persons participating in the virtual visit: Sarah Weaver, Glenna Durand LPN.   I discussed the limitations, risks, security and privacy concerns of performing an evaluation and management service by telephone and the availability of in person appointments. I also discussed with the patient that there may be a patient responsible charge related to this service. The patient expressed understanding and verbally consented to this telephonic visit.    Interactive audio and video telecommunications were attempted between this provider and patient, however failed, due to patient having technical difficulties OR patient did not have access to video capability.  We continued and completed visit with audio only.     Vital signs may be patient reported or missing.  Subjective:   Sarah Weaver is a 86 y.o. female who presents for Medicare Annual (Subsequent) preventive examination.  Review of Systems     Cardiac Risk Factors include: advanced age (>36mn, >>41women);diabetes mellitus;dyslipidemia;hypertension;obesity (BMI >30kg/m2)     Objective:    Today's Vitals   08/13/22 0814  Weight: 299 lb (135.6 kg)  Height: 5' 4" (1.626 m)   Body mass index is 51.32 kg/m.     08/13/2022    8:19 AM 04/11/2022    1:49 AM 12/26/2021    6:52 PM 07/25/2021   11:44 AM 07/11/2020   10:11 AM 05/04/2020   12:56 PM 05/05/2019    9:12 AM  Advanced Directives  Does Patient Have a Medical Advance Directive? _0  No No  Would patient like information on creating a medical advance directive?  No - Patient declined  No - Patient declined  No - Patient declined     Current Medications (verified) Outpatient Encounter Medications as of 08/13/2022  Medication Sig   Accu-Chek Softclix Lancets lancets USE  UP TO 4 TIMES DAILY AS DIRECTED   aspirin EC 81 MG tablet Take 81 mg by mouth daily.   atorvastatin (LIPITOR) 80 MG tablet TAKE 1 TABLET BY MOUTH EVERYDAY AT BEDTIME   BD PEN NEEDLE NANO 2ND GEN 32G X 4 MM MISC USE NIGHTLY AS DIRECTED   blood glucose meter kit and supplies KIT Dispense based on patient and insurance preference. Use up to four times daily as directed. (FOR ICD-9 250.00, 250.01).   brinzolamide (AZOPT) 1 % ophthalmic suspension Place 1 drop into both eyes 3 (three) times daily.   Cholecalciferol 125 MCG (5000 UT) TABS Take 1 tablet by mouth daily.   empagliflozin (JARDIANCE) 10 MG TABS tablet Take 1 tablet (10 mg total) by mouth daily before breakfast.   ezetimibe (ZETIA) 10 MG tablet TAKE 1 TABLET BY MOUTH EVERY DAY   furosemide (LASIX) 40 MG tablet Take 1 tablet (40 mg total) by mouth daily.   insulin degludec (TRESIBA FLEXTOUCH) 100 UNIT/ML FlexTouch Pen Inject 15 Units into the skin daily.   Latanoprostene Bunod 0.024 % SOLN Apply 1 drop to eye daily.   levothyroxine (SYNTHROID) 50 MCG tablet TAKE 1 TABLET BY MOUTH EVERY DAY BEFORE BREAKFAST   LINZESS 145 MCG CAPS capsule TAKE 1 CAPSULE (145 MCG TOTAL) BY MOUTH DAILY BEFORE BREAKFAST. FOR CONSTIPATION   losartan (COZAAR) 50 MG tablet Take 50 mg by mouth at bedtime.   Magnesium 250 MG TABS Take 1 tablet (250 mg total) by mouth every evening. Take with evening meal   nebivolol (BYSTOLIC) 2.5 MG tablet  Take 1 tablet (2.5 mg total) by mouth daily.   ONETOUCH VERIO test strip CHECK BLOOD SUGARS ONCE DAILY   timolol (BETIMOL) 0.5 % ophthalmic solution 1 drop 2 (two) times daily.   albuterol (VENTOLIN HFA) 108 (90 Base) MCG/ACT inhaler Inhale 2 puffs into the lungs every 6 (six) hours as needed for wheezing or shortness of breath. (Patient not taking: Reported on 08/13/2022)   nystatin (NYSTATIN) powder Apply 1 application topically 3 (three) times daily. (Patient not taking: Reported on 08/13/2022)   traMADol-acetaminophen  (ULTRACET) 37.5-325 MG tablet TAKE 1 TABLET BY MOUTH EVERY 6 HOURS AS NEEDED FOR MODERATE BACK PAIN (Patient not taking: Reported on 08/13/2022)   No facility-administered encounter medications on file as of 08/13/2022.    Allergies (verified) Carvedilol and Lisinopril   History: Past Medical History:  Diagnosis Date   Arthritis    "all over"   Chronic lower back pain    CKD Stage III 07/26/2018   Hx of hyperkalemia   Coronary artery disease    a. status post DES to the LAD 12/2015.   Glaucoma    HFpEF    Echocardiogram 12/2019: EF 60-65, no RWMA, mild LVH, Gr 1 DD, trivial MR, mild AoV sclerosis, no AS   Hyperkalemia    Hyperlipidemia    Hypertension    Mobitz (type) I (Wenckebach's) atrioventricular block 10/15/2021   Palpitations Zio Patch Monitor 5/21 NSR, avg HR 67, 1st degree AV block, intermittent Wenckebach during sleep, rare PACs/PVCs   Morbid obesity (HCC)    Palpitations    Type II diabetes mellitus ([place])    Past Surgical History:  Procedure Laterality Date   CARDIAC CATHETERIZATION N/A 12/20/2015   Procedure: Left Heart Cath and Coronary Angiography;  Surgeon: Lorretta Harp, MD;  Location: [facility];  Service: Cardiovascular;  Laterality: N/A;   CARDIAC CATHETERIZATION N/A 12/20/2015   Procedure: Coronary Stent Intervention;  Surgeon: Lorretta Harp, MD;  Location: [facility];  Service: Cardiovascular;  Laterality: N/A;  mid LAD 2.5 x 20 Synergy   CATARACT EXTRACTION W/ INTRAOCULAR LENS  IMPLANT, BILATERAL Bilateral    CORONARY ANGIOPLASTY WITH STENT PLACEMENT  12/20/2015   LAPAROSCOPIC CHOLECYSTECTOMY  04/23/11   TONSILLECTOMY     Family History  Problem Relation Age of Onset   Heart disease Mother    Heart attack Mother    Hypertension Mother    Heart disease Sister    Cancer Sister    Stroke Sister    Social History   Socioeconomic History   Marital status: Divorced    Spouse name: Not on file   Number of children: Not on file   Years  of education: Not on file   Highest education level: Not on file  Occupational History   Occupation: retired  Tobacco Use   Smoking status: Never   Smokeless tobacco: Never  Vaping Use   Vaping Use: Never used  Substance and Sexual Activity   Alcohol use: No   Drug use: No   Sexual activity: Not Currently  Other Topics Concern   Not on file  Social History Narrative   Not on file   Social Determinants of Health   Financial Resource Strain: Low Risk  (08/13/2022)   Overall Financial Resource Strain (CARDIA)    Difficulty of Paying Living Expenses: Not hard at all  Food Insecurity: No Food Insecurity (08/13/2022)   Hunger Vital Sign    Worried About Running Out of Food in the Last Year:  Never true    Ran Out of Food in the Last Year: Never true  Transportation Needs: No Transportation Needs (08/13/2022)   PRAPARE - Hydrologist (Medical): No    Lack of Transportation (Non-Medical): No  Physical Activity: Inactive (08/13/2022)   Exercise Vital Sign    Days of Exercise per Week: 0 days    Minutes of Exercise per Session: 0 min  Stress: No Stress Concern Present (08/13/2022)   [place]    Feeling of Stress : Not at all  Social Connections: Moderately Isolated (08/10/2018)   Social Connection and Isolation Panel [NHANES]    Frequency of Communication with Friends and Family: More than three times a week    Frequency of Social Gatherings with Friends and Family: More than three times a week    Attends Religious Services: Never    Marine scientist or Organizations: No    Attends Music therapist: Never    Marital Status: Divorced    Tobacco Counseling Counseling given: Not Answered   Clinical Intake:  Pre-visit preparation completed: Yes  Pain : No/denies pain     Nutritional Status: BMI > 30  Obese Nutritional Risks: None Diabetes: Yes  How often  do you need to have someone help you when you read instructions, pamphlets, or other written materials from your doctor or pharmacy?: 1 - Never  Diabetic? Yes Nutrition Risk Assessment:  Has the patient had any N/V/D within the last 2 months?  No  Does the patient have any non-healing wounds?  No  Has the patient had any unintentional weight loss or weight gain?  No   Diabetes:  Is the patient diabetic?  Yes  If diabetic, was a CBG obtained today?  No  Did the patient bring in their glucometer from home?  No  How often do you monitor your CBG's? Every other day.   Financial Strains and Diabetes Management:  Are you having any financial strains with the device, your supplies or your medication? No .  Does the patient want to be seen by Chronic Care Management for management of their diabetes?  No  Would the patient like to be referred to a Nutritionist or for Diabetic Management?  No   Diabetic Exams:  Diabetic Eye Exam: Completed 10/31/2021 Diabetic Foot Exam: Completed 08/20/2021   Interpreter Needed?: No  Information entered by :: NAllen LPN   Activities of Daily Living    08/13/2022    8:20 AM  In your present state of health, do you have any difficulty performing the following activities:  Hearing? 0  Vision? 0  Difficulty concentrating or making decisions? 0  Walking or climbing stairs? 0  Dressing or bathing? 0  Doing errands, shopping? 0  Preparing Food and eating ? N  Using the Toilet? N  In the past six months, have you accidently leaked urine? N  Do you have problems with loss of bowel control? N  Managing your Medications? N  Managing your Finances? N  Housekeeping or managing your Housekeeping? N    Patient Care Team: [name], FNP as PCP - General (General Practice) Freada Bergeron, MD as PCP - Cardiology (Cardiology) Mayford Knife, [facility] (Pharmacist) Sharmon Revere as Physician Assistant (Cardiology)  Indicate any recent  Medical Services you may have received from other than [facility] providers in the past year (date may be approximate).     Assessment:  This is a routine wellness examination for Kayleanna.  Hearing/Vision screen Vision Screening - Comments:: Regular eye exams, Dr. Efrain Sella  Dietary issues and exercise activities discussed: Current Exercise Habits: The patient does not participate in regular exercise at present   Goals Addressed             This Visit's Progress    Patient Stated       08/13/2022, no goals       Depression Screen    08/13/2022    8:19 AM 07/25/2021   11:45 AM 07/11/2020   10:12 AM 08/25/2019    9:47 AM 05/17/2019    9:03 AM 05/05/2019    9:12 AM 01/27/2019   10:59 AM  PHQ 2/9 Scores  PHQ - 2 Score 0 0 0 0 0 0 0  PHQ- 9 Score   0   1     Fall Risk    08/13/2022    8:19 AM 07/25/2021   11:45 AM 07/11/2020   10:11 AM 08/25/2019    9:47 AM 06/30/2019    8:43 AM  Fall Risk   Falls in the past year? 0 0 0 0 0  Number falls in past yr: 0      Injury with Fall? 0      Risk for fall due to : Medication side effect Impaired balance/gait;Impaired mobility;Medication side effect Impaired mobility;Medication side effect    Follow up Falls prevention discussed;Education provided;Falls evaluation completed Falls evaluation completed;Education provided;Falls prevention discussed Falls evaluation completed;Education provided;Falls prevention discussed      FALL RISK PREVENTION PERTAINING TO THE HOME:  Any stairs in or around the home? No  If so, are there any without handrails? No  Home free of loose throw rugs in walkways, pet beds, electrical cords, etc? Yes  Adequate lighting in your home to reduce risk of falls? Yes   ASSISTIVE DEVICES UTILIZED TO PREVENT FALLS:  Life alert? No  Use of a cane, walker or w/c? Yes  Grab bars in the bathroom? No  Shower chair or bench in shower? No  Elevated toilet seat or a handicapped toilet? No   TIMED UP AND  GO:  Was the test performed? No .       Cognitive Function:        08/13/2022    8:21 AM 07/25/2021   11:47 AM 07/11/2020   10:13 AM 05/05/2019    9:15 AM 08/10/2018   12:39 PM  6CIT Screen  What Year? 0 points 0 points 0 points 0 points 0 points  What month? 0 points 0 points 0 points 0 points 0 points  What time? 0 points 0 points 0 points 0 points 0 points  Count back from 20 0 points 0 points 0 points 0 points 0 points  Months in reverse 0 points 0 points 0 points 0 points 0 points  Repeat phrase 6 points 4 points 2 points 0 points 0 points  Total Score 6 points 4 points 2 points 0 points 0 points    Immunizations Immunization History  Administered Date(s) Administered   Fluad Quad(high Dose 65+) 07/11/2020, 06/26/2021   Influenza, High Dose Seasonal PF 07/29/2017, 07/08/2018, 06/23/2019   Influenza-Unspecified 06/15/2018   PFIZER(Purple Top)SARS-COV-2 Vaccination 09/26/2020, 10/17/2020   Pneumococcal Polysaccharide-23 04/24/2011, 07/29/2017   Tdap 09/22/2018    TDAP status: Up to date  Flu Vaccine status: Due, Education has been provided regarding the importance of this vaccine. Advised may receive this vaccine at local pharmacy or Health  Dept. Aware to provide a copy of the vaccination record if obtained from local pharmacy or Health Dept. Verbalized acceptance and understanding.  Pneumococcal vaccine status: Up to date  Covid-19 vaccine status: Completed vaccines  Qualifies for Shingles Vaccine? Yes   Zostavax completed No   Shingrix Completed?: No.    Education has been provided regarding the importance of this vaccine. Patient has been advised to call insurance company to determine out of pocket expense if they have not yet received this vaccine. Advised may also receive vaccine at local pharmacy or Health Dept. Verbalized acceptance and understanding.  Screening Tests Health Maintenance  Topic Date Due   Zoster Vaccines- Shingrix (1 of 2) Never done    Pneumonia Vaccine 15+ Years old (2 - PCV) 07/29/2018   INFLUENZA VACCINE  04/15/2022   COVID-19 Vaccine (3 - 2023-24 season) 05/16/2022   Diabetic kidney evaluation - Urine ACR  07/08/2022   Medicare Annual Wellness (AWV)  07/25/2022   FOOT EXAM  08/20/2022   HEMOGLOBIN A1C  09/24/2022   OPHTHALMOLOGY EXAM  10/31/2022   Diabetic kidney evaluation - GFR measurement  04/12/2023   DEXA SCAN  Completed   HPV VACCINES  Aged Out    Health Maintenance  Health Maintenance Due  Topic Date Due   Zoster Vaccines- Shingrix (1 of 2) Never done   Pneumonia Vaccine 53+ Years old (2 - PCV) 07/29/2018   INFLUENZA VACCINE  04/15/2022   COVID-19 Vaccine (3 - 2023-24 season) 05/16/2022   Diabetic kidney evaluation - Urine ACR  07/08/2022   Medicare Annual Wellness (AWV)  07/25/2022    Colorectal cancer screening: No longer required.   Mammogram status: No longer required due to age.  Bone Density status: Completed 10/11/2019.   Lung Cancer Screening: (Low Dose CT Chest recommended if Age 47-80 years, 30 pack-year currently smoking OR have quit w/in 15years.) does not qualify.   Lung Cancer Screening Referral: no  Additional Screening:  Hepatitis C Screening: does not qualify;   Vision Screening: Recommended annual ophthalmology exams for early detection of glaucoma and other disorders of the eye. Is the patient up to date with their annual eye exam?  Yes  Who is the provider or what is the name of the office in which the patient attends annual eye exams? Dr. Efrain Sella If pt is not established with a provider, would they like to be referred to a provider to establish care? No .   Dental Screening: Recommended annual dental exams for proper oral hygiene  Community Resource Referral / Chronic Care Management: CRR required this visit?  No   CCM required this visit?  No      Plan:     I have personally reviewed and noted the following in the patient's chart:   Medical and social  history Use of alcohol, tobacco or illicit drugs  Current medications and supplements including opioid prescriptions. Patient is not currently taking opioid prescriptions. Functional ability and status Nutritional status Physical activity Advanced directives List of other physicians Hospitalizations, surgeries, and ER visits in previous 12 months Vitals Screenings to include cognitive, depression, and falls Referrals and appointments  In addition, I have reviewed and discussed with patient certain preventive protocols, quality metrics, and best practice recommendations. A written personalized care plan for preventive services as well as general preventive health recommendations were provided to patient.     Kellie Simmering, LPN   [id number]/74/[id number]   Nurse Notes: none  Due to this being a virtual visit, the  after visit summary with patients personalized plan was offered to patient via mail or my-chart. Patient would like to access on my-chart

## 2022-08-13 NOTE — Patient Instructions (Signed)
Sarah Weaver , Thank you for taking time to come for your Medicare Wellness Visit. I appreciate your ongoing commitment to your health goals. Please review the following plan we discussed and let me know if I can assist you in the future.   Screening recommendations/referrals: Colonoscopy: not required Mammogram: not required Bone Density: completed 10/11/2019 Recommended yearly ophthalmology/optometry visit for glaucoma screening and checkup Recommended yearly dental visit for hygiene and checkup  Vaccinations: Influenza vaccine: due Pneumococcal vaccine: due Tdap vaccine: completed 09/22/2018, due 09/22/2028 Shingles vaccine: discussed   Covid-19: 10/17/2020, 09/26/2020  Advanced directives: Advance directive discussed with you today.   Conditions/risks identified: none  Next appointment: Follow up in one year for your annual wellness visit    Preventive Care 65 Years and Older, Female Preventive care refers to lifestyle choices and visits with your health care provider that can promote health and wellness. What does preventive care include? A yearly physical exam. This is also called an annual well check. Dental exams once or twice a year. Routine eye exams. Ask your health care provider how often you should have your eyes checked. Personal lifestyle choices, including: Daily care of your teeth and gums. Regular physical activity. Eating a healthy diet. Avoiding tobacco and drug use. Limiting alcohol use. Practicing safe sex. Taking low-dose aspirin every day. Taking vitamin and mineral supplements as recommended by your health care provider. What happens during an annual well check? The services and screenings done by your health care provider during your annual well check will depend on your age, overall health, lifestyle risk factors, and family history of disease. Counseling  Your health care provider may ask you questions about your: Alcohol use. Tobacco use. Drug  use. Emotional well-being. Home and relationship well-being. Sexual activity. Eating habits. History of falls. Memory and ability to understand (cognition). Work and work Astronomer. Reproductive health. Screening  You may have the following tests or measurements: Height, weight, and BMI. Blood pressure. Lipid and cholesterol levels. These may be checked every 5 years, or more frequently if you are over 69 years old. Skin check. Lung cancer screening. You may have this screening every year starting at age 60 if you have a 30-pack-year history of smoking and currently smoke or have quit within the past 15 years. Fecal occult blood test (FOBT) of the stool. You may have this test every year starting at age 60. Flexible sigmoidoscopy or colonoscopy. You may have a sigmoidoscopy every 5 years or a colonoscopy every 10 years starting at age 24. Hepatitis C blood test. Hepatitis B blood test. Sexually transmitted disease (STD) testing. Diabetes screening. This is done by checking your blood sugar (glucose) after you have not eaten for a while (fasting). You may have this done every 1-3 years. Bone density scan. This is done to screen for osteoporosis. You may have this done starting at age 100. Mammogram. This may be done every 1-2 years. Talk to your health care provider about how often you should have regular mammograms. Talk with your health care provider about your test results, treatment options, and if necessary, the need for more tests. Vaccines  Your health care provider may recommend certain vaccines, such as: Influenza vaccine. This is recommended every year. Tetanus, diphtheria, and acellular pertussis (Tdap, Td) vaccine. You may need a Td booster every 10 years. Zoster vaccine. You may need this after age 44. Pneumococcal 13-valent conjugate (PCV13) vaccine. One dose is recommended after age 17. Pneumococcal polysaccharide (PPSV23) vaccine. One dose is recommended after  age  47. Talk to your health care provider about which screenings and vaccines you need and how often you need them. This information is not intended to replace advice given to you by your health care provider. Make sure you discuss any questions you have with your health care provider. Document Released: 09/28/2015 Document Revised: 05/21/2016 Document Reviewed: 07/03/2015 Elsevier Interactive Patient Education  2017 [employer] Prevention in the Home Falls can cause injuries. They can happen to people of all ages. There are many things you can do to make your home safe and to help prevent falls. What can I do on the outside of my home? Regularly fix the edges of walkways and driveways and fix any cracks. Remove anything that might make you trip as you walk through a door, such as a raised step or threshold. Trim any bushes or trees on the path to your home. Use bright outdoor lighting. Clear any walking paths of anything that might make someone trip, such as rocks or tools. Regularly check to see if handrails are loose or broken. Make sure that both sides of any steps have handrails. Any raised decks and porches should have guardrails on the edges. Have any leaves, snow, or ice cleared regularly. Use sand or salt on walking paths during winter. Clean up any spills in your garage right away. This includes oil or grease spills. What can I do in the bathroom? Use night lights. Install grab bars by the toilet and in the tub and shower. Do not use towel bars as grab bars. Use non-skid mats or decals in the tub or shower. If you need to sit down in the shower, use a plastic, non-slip stool. Keep the floor dry. Clean up any water that spills on the floor as soon as it happens. Remove soap buildup in the tub or shower regularly. Attach bath mats securely with double-sided non-slip rug tape. Do not have throw rugs and other things on the floor that can make you trip. What can I do in the  bedroom? Use night lights. Make sure that you have a light by your bed that is easy to reach. Do not use any sheets or blankets that are too big for your bed. They should not hang down onto the floor. Have a firm chair that has side arms. You can use this for support while you get dressed. Do not have throw rugs and other things on the floor that can make you trip. What can I do in the kitchen? Clean up any spills right away. Avoid walking on wet floors. Keep items that you use a lot in easy-to-reach places. If you need to reach something above you, use a strong step stool that has a grab bar. Keep electrical cords out of the way. Do not use floor polish or wax that makes floors slippery. If you must use wax, use non-skid floor wax. Do not have throw rugs and other things on the floor that can make you trip. What can I do with my stairs? Do not leave any items on the stairs. Make sure that there are handrails on both sides of the stairs and use them. Fix handrails that are broken or loose. Make sure that handrails are as long as the stairways. Check any carpeting to make sure that it is firmly attached to the stairs. Fix any carpet that is loose or worn. Avoid having throw rugs at the top or bottom of the stairs. If you do  have throw rugs, attach them to the floor with carpet tape. Make sure that you have a light switch at the top of the stairs and the bottom of the stairs. If you do not have them, ask someone to add them for you. What else can I do to help prevent falls? Wear shoes that: Do not have high heels. Have rubber bottoms. Are comfortable and fit you well. Are closed at the toe. Do not wear sandals. If you use a stepladder: Make sure that it is fully opened. Do not climb a closed stepladder. Make sure that both sides of the stepladder are locked into place. Ask someone to hold it for you, if possible. Clearly mark and make sure that you can see: Any grab bars or  handrails. First and last steps. Where the edge of each step is. Use tools that help you move around (mobility aids) if they are needed. These include: Canes. Walkers. Scooters. Crutches. Turn on the lights when you go into a dark area. Replace any light bulbs as soon as they burn out. Set up your furniture so you have a clear path. Avoid moving your furniture around. If any of your floors are uneven, fix them. If there are any pets around you, be aware of where they are. Review your medicines with your doctor. Some medicines can make you feel dizzy. This can increase your chance of falling. Ask your doctor what other things that you can do to help prevent falls. This information is not intended to replace advice given to you by your health care provider. Make sure you discuss any questions you have with your health care provider. Document Released: 06/28/2009 Document Revised: 02/07/2016 Document Reviewed: 10/06/2014 Elsevier Interactive Patient Education  2017 [employer].

## 2022-09-01 ENCOUNTER — Encounter: Payer: Medicare Other | Admitting: Nurse Practitioner

## 2022-09-22 ENCOUNTER — Other Ambulatory Visit: Payer: Self-pay

## 2022-09-22 ENCOUNTER — Other Ambulatory Visit: Payer: Self-pay | Admitting: Internal Medicine

## 2022-09-22 DIAGNOSIS — E1122 Type 2 diabetes mellitus with diabetic chronic kidney disease: Secondary | ICD-10-CM

## 2022-09-22 MED ORDER — LINACLOTIDE 145 MCG PO CAPS: ORAL_CAPSULE | ORAL | 0 refills | Status: DC

## 2022-09-25 DIAGNOSIS — N183 Chronic kidney disease, stage 3 unspecified: Secondary | ICD-10-CM | POA: Diagnosis not present

## 2022-09-25 DIAGNOSIS — I129 Hypertensive chronic kidney disease with stage 1 through stage 4 chronic kidney disease, or unspecified chronic kidney disease: Secondary | ICD-10-CM | POA: Diagnosis not present

## 2022-09-25 DIAGNOSIS — R809 Proteinuria, unspecified: Secondary | ICD-10-CM | POA: Diagnosis not present

## 2022-09-25 DIAGNOSIS — E1122 Type 2 diabetes mellitus with diabetic chronic kidney disease: Secondary | ICD-10-CM | POA: Diagnosis not present

## 2022-09-27 ENCOUNTER — Other Ambulatory Visit: Payer: Self-pay | Admitting: Nurse Practitioner

## 2022-10-11 ENCOUNTER — Other Ambulatory Visit: Payer: Self-pay | Admitting: Nurse Practitioner

## 2022-10-27 ENCOUNTER — Other Ambulatory Visit: Payer: Self-pay | Admitting: *Deleted

## 2022-10-27 MED ORDER — EZETIMIBE 10 MG PO TABS: 10.0000 mg | ORAL_TABLET | Freq: Every day | ORAL | 3 refills | Status: DC

## 2022-10-31 ENCOUNTER — Telehealth: Payer: Self-pay

## 2022-10-31 NOTE — Progress Notes (Signed)
  Care Management & Coordination Services Pharmacy Team  Reason for Encounter: Appointment Reminder  Contacted patient to confirm telephone appointment with Orlando Penner, PharmD on 11-04-2022 at 9:00. Unsuccessful outreach. Unable to leave voicemail.   Chart review: Recent office visits:  08-13-2022 Kellie Simmering, LPN. Medicare annual visit.  Recent consult visits:  None  Hospital visits:  None in previous 6 months   Star Rating Drugs:  Ozempic 0.5 mg- [facility] to [facility] Losartan 50 mg- Last filled [facility]23-2022 [facility]. Previous 07-25-2022 90 DS Jardiance 10 mg- Last filled [facility]-12-2022 [facility] Atorvastatin 80 mg- Last filled [facility]22-2022 [facility] Previous 07-28-2022 90 DS  Care Gaps: Annual wellness visit in last year? No Uacr overdue Shingrix overdue PNA vac overdue Flu vaccine overdue Covid booster overdue Foot exam overdue A1C overdue Eye exam overdue   [place] Pharmacist Assistant [phone]

## 2022-11-04 ENCOUNTER — Ambulatory Visit: Payer: 59

## 2022-11-04 NOTE — Progress Notes (Signed)
Care Management & Coordination Services Pharmacy Note  11/04/2022 Name:  Sarah Weaver MRN:  [id number] DOB:  [birth year]  Summary: Patient reports that she has a cold and is starting to feel better. She was recenetly seen by her kidney doctor in January and he would like her to restart Ozempic. She received a letter from her insurance that stated they were no longer going to cover the cost of Ozempic.   Recommendations/Changes made from today's visit: Recommend restarting Ozempic and applying for patient assistance program through [employer].    Follow up plan: Patient assistance application for Ozempic to be started.  Collaborate with PCP to supply Ozempic sample for patient to restart at 0.25 mg once per week.    Subjective: Sarah Weaver is an 87 y.o. year old female who is a primary patient of [name], [place].  The care coordination team was consulted for assistance with disease management and care coordination needs.    Engaged with patient by telephone for follow up visit.  Recent office visits: 07/04/2022 PCP OV   Recent consult visits: 09/25/2022 Nephrology [facility] visits: None in previous 6 months   Objective:  Lab Results  Component Value Date   CREATININE 1.58 (H) 04/11/2022   BUN 27 (H) 04/11/2022   EGFR 37 (L) 03/24/2022   GFRNONAA 32 (L) 04/11/2022   GFRAA 34 (L) 11/06/2020   NA 139 04/11/2022   K 3.9 04/11/2022   CALCIUM 9.5 04/11/2022   CO2 27 04/11/2022   GLUCOSE 191 (H) 04/11/2022    Lab Results  Component Value Date/Time   HGBA1C 6.9 (H) 03/24/2022 03:35 PM   HGBA1C 7.0 (H) 11/18/2021 04:22 PM    Last diabetic Eye exam:  Lab Results  Component Value Date/Time   HMDIABEYEEXA No Retinopathy 10/31/2021 12:00 AM    Last diabetic Foot exam: No results found for: "HMDIABFOOTEX"   Lab Results  Component Value Date   CHOL 117 03/24/2022   HDL 50 03/24/2022   LDLCALC 53 03/24/2022   TRIG 69 03/24/2022    CHOLHDL 2.3 03/24/2022       Latest Ref Rng & Units 03/24/2022    3:35 PM 06/26/2021    5:06 PM 11/06/2020   12:02 PM  Hepatic Function  Total Protein 6.0 - 8.5 g/dL  6.9  7.1   Albumin 3.7 - 4.7 g/dL 4.2  4.3  4.1   AST 0 - 40 IU/L  24  23   ALT 0 - 32 IU/L  34  21   Alk Phosphatase 44 - 121 IU/L  98  95   Total Bilirubin 0.0 - 1.2 mg/dL  0.4  0.5     Lab Results  Component Value Date/Time   TSH 1.840 03/24/2022 03:35 PM   TSH 1.770 03/19/2021 12:44 PM   FREET4 1.32 05/05/2019 02:34 PM       Latest Ref Rng & Units 04/11/2022    2:56 AM 10/15/2021    9:20 AM 07/11/2020   11:31 AM  CBC  WBC 4.0 - 10.5 K/uL 4.7  3.8  4.2   Hemoglobin 12.0 - 15.0 g/dL 13.1  12.7  12.5   Hematocrit 36.0 - 46.0 % 40.2  38.2  39.3   Platelets 150 - 400 K/uL 189  197  204     No results found for: "VD25OH", "VITAMINB12"  Clinical ASCVD: Yes  The ASCVD Risk score (Arnett DK, et al., 2019) failed to calculate for the following reasons:   The  2019 ASCVD risk score is only valid for ages 44 to 66   The patient has a prior MI or stroke diagnosis        08/13/2022    8:19 AM 07/25/2021   11:45 AM 07/11/2020   10:12 AM  Depression screen PHQ 2/9  Decreased Interest 0 0 0  Down, Depressed, Hopeless 0 0 0  PHQ - 2 Score 0 0 0  Altered sleeping   0  Tired, decreased energy   0  Change in appetite   0  Feeling bad or failure about yourself    0  Trouble concentrating   0  Moving slowly or fidgety/restless   0  Suicidal thoughts   0  PHQ-9 Score   0  Difficult doing work/chores   Not difficult at all     Social History   Tobacco Use  Smoking Status Never  Smokeless Tobacco Never   BP Readings from Last 3 Encounters:  04/16/22 132/82  04/11/22 140/67  03/24/22 130/80   Pulse Readings from Last 3 Encounters:  04/16/22 74  04/11/22 65  03/24/22 75   Wt Readings from Last 3 Encounters:  08/13/22 299 lb (135.6 kg)  04/24/22 290 lb 3.2 oz (131.6 kg)  04/16/22 290 lb (131.5 kg)    BMI Readings from Last 3 Encounters:  08/13/22 51.32 kg/m  04/24/22 49.81 kg/m  04/16/22 49.78 kg/m    Allergies  Allergen Reactions   Carvedilol Cough    Pt reports causes her to cough   Lisinopril Cough    REPORTS CAUSES DRY COUGH    Medications Reviewed Today     Reviewed by Kellie Simmering, LPN (Licensed Practical Nurse) on 08/13/22 at [phone]  Med List Status: <None>   Medication Order Taking? Sig Documenting Provider Last Dose Status Informant  Accu-Chek Softclix Lancets lancets [id number] Yes USE UP TO 4 TIMES DAILY AS DIRECTED [name], FNP Taking Active   albuterol (VENTOLIN HFA) 108 (90 Base) MCG/ACT inhaler [id number] No Inhale 2 puffs into the lungs every 6 (six) hours as needed for wheezing or shortness of breath.  Patient not taking: Reported on 08/13/2022   Bary Castilla, NP Not Taking Active   aspirin EC 81 MG tablet [id number] Yes Take 81 mg by mouth daily. [provider] Taking Active Self  atorvastatin (LIPITOR) 80 MG tablet [id number] Yes TAKE 1 TABLET BY MOUTH EVERYDAY AT BEDTIME [name], FNP Taking Active   BD PEN NEEDLE NANO 2ND GEN 32G X 4 MM MISC [id number] Yes USE NIGHTLY AS DIRECTED [name], FNP Taking Active   blood glucose meter kit and supplies KIT [id number] Yes Dispense based on patient and insurance preference. Use up to four times daily as directed. (FOR ICD-9 250.00, 250.01). [name], FNP Taking Active   brinzolamide (AZOPT) 1 % ophthalmic suspension [id number] Yes Place 1 drop into both eyes 3 (three) times daily. [provider] Taking Active Self  Cholecalciferol 125 MCG (5000 UT) TABS [id number] Yes Take 1 tablet by mouth daily. [provider] Taking Active   empagliflozin (JARDIANCE) 10 MG TABS tablet [id number] Yes Take 1 tablet (10 mg total) by mouth daily before breakfast. Freada Bergeron, MD Taking Active   ezetimibe (ZETIA) 10 MG tablet [id number] Yes TAKE 1 TABLET BY MOUTH EVERY DAY  Freada Bergeron, MD Taking Active   furosemide (LASIX) 40 MG tablet [id number] Yes Take 1 tablet (40 mg total) by mouth daily. Freada Bergeron, MD Taking Active   insulin  degludec (TRESIBA FLEXTOUCH) 100 UNIT/ML FlexTouch Pen [id number] Yes Inject 15 Units into the skin daily. [name], FNP Taking Active   Latanoprostene Bunod 0.024 % SOLN [id number] Yes Apply 1 drop to eye daily. [provider] Taking Active Self  levothyroxine (SYNTHROID) 50 MCG tablet [id number] Yes TAKE 1 TABLET BY MOUTH EVERY DAY BEFORE Evelena Peat, FNP Taking Active   LINZESS 145 MCG CAPS capsule [id number] Yes TAKE 1 CAPSULE (145 MCG TOTAL) BY MOUTH DAILY BEFORE BREAKFAST. FOR CONSTIPATION [name], FNP Taking Active   losartan (COZAAR) 50 MG tablet [id number] Yes Take 50 mg by mouth at bedtime. Rexene Agent, MD Taking Active   Magnesium 250 MG TABS [id number] Yes Take 1 tablet (250 mg total) by mouth every evening. Take with evening meal [name], FNP Taking Active   nebivolol (BYSTOLIC) 2.5 MG tablet [id number] Yes Take 1 tablet (2.5 mg total) by mouth daily. Freada Bergeron, MD Taking Active   nystatin (NYSTATIN) powder [id number] No Apply 1 application topically 3 (three) times daily.  Patient not taking: Reported on 08/13/2022   [name], FNP Not Taking Active   [facility] VERIO test strip [id number] Yes CHECK BLOOD SUGARS ONCE DAILY Rodriguez-[place]worth, Sunday Spillers, PA-C Taking Active   timolol (BETIMOL) 0.5 % ophthalmic solution [id number] Yes 1 drop 2 (two) times daily. [provider] Taking Active   traMADol-acetaminophen (ULTRACET) 37.5-325 MG tablet [id number] No TAKE 1 TABLET BY MOUTH EVERY 6 HOURS AS NEEDED FOR MODERATE BACK PAIN  Patient not taking: Reported on 08/13/2022   [name], Protivin Not Taking Active             SDOH:  (Social Determinants of Health) assessments and interventions performed: No SDOH Interventions    Flowsheet Row Clinical  Support from 08/13/2022 in [facility] Chronic Care Management from 01/10/2021 in [facility] Clinical Support from 07/11/2020 in [facility] [facility] from 05/05/2019 in [facility]  SDOH Interventions      Food Insecurity Interventions Intervention Not Indicated Intervention Not Indicated -- --  Housing Interventions -- Intervention Not Indicated -- --  Transportation Interventions Intervention Not Indicated Intervention Not Indicated -- --  Depression Interventions/Treatment  -- -- [id number] Score <4 Follow-up Not Indicated PHQ2-9 Score <4 Follow-up Not Indicated  Financial Strain Interventions Intervention Not Indicated -- -- --  Physical Activity Interventions Patient Refused, Other (Comments) -- -- --  Stress Interventions Intervention Not Indicated -- -- --       Medication Assistance: None required.  Patient affirms current coverage meets needs.  Medication Access: Within the past 30 days, how often has patient missed a dose of medication? No  Is a pillbox or other method used to improve adherence? Yes  Factors that may affect medication adherence? financial need Are meds synced by current pharmacy? No  Are meds delivered by current pharmacy? No  Does patient experience delays in picking up medications due to transportation concerns? Yes   [facility] Services Reviewed: Is patient disadvantaged to use [facility]?: No  Current Rx insurance plan: [employer] [employer]   [facility] and [id number] [address] [id number]  [place] [id number]: [phone]: [phone] [facility] [address] [id number] [id number]ne: [phone]:  [phone]  [facility] services reviewed with patient today?: No  Patient requests to transfer care to [facility] Pharm[facility]?: No  Reason patient declined to change pharmacies: Loyalty to other pharmacy/Patient preference  Compliance/Adherence/Medication fill history: Care Gaps: Urine ACR- completed by [facility] on 09/25/2022 - PCP team to capture  Star-Rating Drugs: Atorvastatin 80 mg tablet  Jardiance 10 mg tablet  Losartan 50 mg tablet    Assessment/Plan  Diabetes (A1c goal <7%) -Controlled -Current medications: Tresiba Flextouch- inject 10 units daily. Patient reports taking 15 units. Appropriate, Effective, Safe, Accessible Jardiance 10 mg tablet once per day Appropriate, Effective, Safe, Query accessible Ozempic 0.5 mg once per week Appropriate, Effective, Safe, Query accessible Recp,,med  -Medications previously tried: Ozempic 0.5 mg injection once per week, patient reports that her kidney doctor would like her to take it but insurance would not pay for it.   -Current home glucose readings fasting glucose: 128 on Sunday  -Denies hypoglycemic/hyperglycemic symptoms -Current meal patterns:  dinner: collared greens and sliced tomatoes  drinks: she drank four 16 ounce bottles of water yesterday  -Current exercise: will discuss during next office visit.  -Educated on A1c and blood sugar goals; Complications of diabetes including kidney damage, retinal damage, and cardiovascular disease; Collaborated with patient and she is open to restarting her Ozempic if the medication is going to be covered patient assistance   since insurance is unable to cover the cost  -Counseled to check feet daily and get yearly eye exams -Recommended to continue current medication  Orlando Penner, CPP, PharmD Clinical Pharmacist Practitioner [facility] [phone]

## 2022-11-05 ENCOUNTER — Telehealth: Payer: Self-pay

## 2022-11-05 NOTE — Progress Notes (Cosign Needed)
11-05-2022: completed/uploaded ozempic PAP.   [place] Pharmacist Assistant [phone]

## 2022-11-05 NOTE — Telephone Encounter (Signed)
Patient informed that PCP team agreed with restarting Ozempic sample provided by PCP team of 0.25 mg for one month, her patient assistance application will be sent to her to fill out and she will bring it back in. Patient voiced understanding.   Orlando Penner, CPP, PharmD Clinical Pharmacist Practitioner [facility] [phone]

## 2022-12-01 ENCOUNTER — Telehealth: Payer: Self-pay

## 2022-12-01 NOTE — Progress Notes (Addendum)
Care Management & Coordination Services Pharmacy Team  Reason for Encounter: Appointment Reminder  Contacted patient to confirm telephone appointment with Orlando Penner, PharmD on 12-03-2022 at 9:00 Spoke with patient on 12/01/2022   Do you have any problems getting your medications? No  What is your top health concern you would like to discuss at your upcoming visit? Patient stated she would like to discuss pill alternatives for Ozempic  Have you seen any other providers since your last visit with PCP? No   Chart review: Recent office visits:  None  Recent consult visits:  None  Hospital visits:  None in previous 6 months   Star Rating Drugs:  Ozempic 0.5 mg- PAP in process (Sample given) Losartan 50 mg- [facility] Previous 07-25-2022 90 DS Jardiance 10 mg- Last filled [facility]15-2022 [facility]12-[facility]22 [facility] Atorvastatin 80 mg- Last filled [facility]22-[facility] Previous 07-28-2022 90 DS   Care Gaps: Annual wellness visit in last year? Yes Uacr overdue Shingrix overdue PNA vac overdue Flu vaccine overdue Covid booster overdue Foot exam overdue A1C overdue Eye exam overdue  If Diabetic: Last eye exam / retinopathy screening: 01-2022 Last diabetic foot exam: None   Jeannette How [facility] Clinical Pharmacist Assistant [phone]

## 2022-12-03 ENCOUNTER — Telehealth: Payer: Self-pay

## 2022-12-03 NOTE — Telephone Encounter (Signed)
  Care Management   Follow Up Note   12/03/2022 Name: Sarah Weaver MRN: [id number] DOB: January 01, 1936   Referred by: [name], FNP Reason for referral : No chief complaint on file.   An unsuccessful telephone outreach was attempted today. The patient was referred to the case management team for assistance with care management and care coordination.   Follow Up Plan:  Will try to reach out to patient again today, of note the patient has not been since 03/2022. She cancelled her last appointment in December, will make provider aware of patients needs for A1c, lipid panel, CMP due to DM II and HTN.    Orlando Penner, CPP, PharmD Clinical Pharmacist Practitioner [facility] [phone]

## 2022-12-12 ENCOUNTER — Other Ambulatory Visit: Payer: Self-pay

## 2022-12-12 NOTE — Progress Notes (Unsigned)
Cardiology Office Note:    Date:  12/15/2022   ID:  Sarah Weaver, DOB 1936-07-11, MRN [id number]  PCP:  Sarah Weaver, [place] [facility] Cardiologist:  Sarah Bergeron, MD Cardiology APP:  Sarah Weaver {   Referring MD: Sarah Brine, FNP     History of Present Illness:    Sarah Weaver is a 87 y.o. female with a hx of CAD s/p DES to LAD in 12/2015, HTN, DDMII, HLD, morbid obesity, CKD III, and chronic diastolic heart failure who was previously followed by Sarah Weaver who now returns to clinic for follow-up for her CAD and diastolic heart failure.   Per review of the record, last cath was 2017 which showed LAD ostial 25, mid 99; D2 ostial 40 LCx proximal 40 s/p PCI with 2.5 x 20 mm Synergy DES to the mid LAD. TTE 12/2019 EF 60-65, no RWMA, mild LVH, Gr 1 DD, trivial MR, AoV sclerosis (no AS). Cardiac monitor 12/29/19 showed sinus bradycardia to sinus rhythm the entire monitoring time, first-degree AV block and intermittent nocturnal Wenckebach episodes, rare PACs and PVCs, no significant arrhythmias.  Was last seen in clinic on 09/2021 by Sarah Weaver. LE edema improved off amlodipine. Was otherwise doing well from CV standpoint.  Today, the patient overall feels well. Had one episode of sharp chest pain on Friday that lasted several seconds and then abated. She was laying in bed at the time and states it felt nothing like the pain she had with her heart arteries in the past. No exertional symptoms. Otherwise, has chronic dyspnea on exertion that is not progression. No orthopnea, PND, or palpitations. No lightheadedness, dizziness or palpitations. LE edema fairly controlled on lasix.    Past Medical History:  Diagnosis Date   Arthritis    "all over"   Chronic lower back pain    CKD Stage III 07/26/2018   Hx of hyperkalemia   Coronary artery disease    a. status post DES to the LAD 12/2015.   Glaucoma    HFpEF    Echocardiogram  12/2019: EF 60-65, no RWMA, mild LVH, Gr 1 DD, trivial MR, mild AoV sclerosis, no AS   Hyperkalemia    Hyperlipidemia    Hypertension    Mobitz (type) I (Wenckebach's) atrioventricular block 10/15/2021   Palpitations Zio Patch Monitor 5/21 NSR, avg HR 67, 1st degree AV block, intermittent Wenckebach during sleep, rare PACs/PVCs   Morbid obesity    Palpitations    Type II diabetes mellitus     Past Surgical History:  Procedure Laterality Date   CARDIAC CATHETERIZATION N/A 12/20/2015   Procedure: Left Heart Cath and Coronary Angiography;  Surgeon: Lorretta Harp, MD;  Location: [facility];  Service: Cardiovascular;  Laterality: N/A;   CARDIAC CATHETERIZATION N/A 12/20/2015   Procedure: Coronary Stent Intervention;  Surgeon: Lorretta Harp, MD;  Location: [facility];  Service: Cardiovascular;  Laterality: N/A;  mid LAD 2.5 x 20 Synergy   CATARACT EXTRACTION W/ INTRAOCULAR LENS  IMPLANT, BILATERAL Bilateral    CORONARY ANGIOPLASTY WITH STENT PLACEMENT  12/20/2015   LAPAROSCOPIC CHOLECYSTECTOMY  04/23/11   TONSILLECTOMY      Current Medications: Current Meds  Medication Sig   Accu-Chek Softclix Lancets lancets USE UP TO 4 TIMES DAILY AS DIRECTED   aspirin EC 81 MG tablet Take 81 mg by mouth daily.   atorvastatin (LIPITOR) 80 MG tablet TAKE 1 TABLET BY MOUTH EVERYDAY AT BEDTIME  BD PEN NEEDLE NANO 2ND GEN 32G X 4 MM MISC USE NIGHTLY AS DIRECTED   blood glucose meter kit and supplies KIT Dispense based on patient and insurance preference. Use up to four times daily as directed. (FOR ICD-9 250.00, 250.01).   Cholecalciferol 125 MCG (5000 UT) TABS Take 1 tablet by mouth daily.   empagliflozin (JARDIANCE) 10 MG TABS tablet Take 1 tablet (10 mg total) by mouth daily before breakfast.   ezetimibe (ZETIA) 10 MG tablet Take 1 tablet (10 mg total) by mouth daily.   furosemide (LASIX) 40 MG tablet Take 1 tablet (40 mg total) by mouth daily.   insulin degludec (TRESIBA FLEXTOUCH) 100  UNIT/ML FlexTouch Pen INJECT 10 UNITS INTO THE SKIN DAILY   Latanoprostene Bunod 0.024 % SOLN Apply 1 drop to eye daily.   levothyroxine (SYNTHROID) 50 MCG tablet TAKE 1 TABLET BY MOUTH EVERY DAY BEFORE BREAKFAST   LINZESS 145 MCG CAPS capsule TAKE 1 CAPSULE (145 MCG TOTAL) BY MOUTH DAILY BEFORE BREAKFAST. FOR CONSTIPATION   losartan (COZAAR) 50 MG tablet Take 50 mg by mouth at bedtime.   Magnesium 250 MG TABS Take 1 tablet (250 mg total) by mouth every evening. Take with evening meal   nebivolol (BYSTOLIC) 2.5 MG tablet Take 1 tablet (2.5 mg total) by mouth daily.   nystatin (NYSTATIN) powder Apply 1 application topically 3 (three) times daily.   ONETOUCH VERIO test strip CHECK BLOOD SUGARS ONCE DAILY   timolol (BETIMOL) 0.5 % ophthalmic solution 1 drop 2 (two) times daily.   traMADol-acetaminophen (ULTRACET) 37.5-325 MG tablet TAKE 1 TABLET BY MOUTH EVERY 6 HOURS AS NEEDED FOR MODERATE BACK PAIN     Allergies:   Carvedilol and Lisinopril   Social History   Socioeconomic History   Marital status: Divorced    Spouse name: Not on file   Number of children: Not on file   Years of education: Not on file   Highest education level: Not on file  Occupational History   Occupation: retired  Tobacco Use   Smoking status: Never   Smokeless tobacco: Never  Vaping Use   Vaping Use: Never used  Substance and Sexual Activity   Alcohol use: No   Drug use: No   Sexual activity: Not Currently  Other Topics Concern   Not on file  Social History Narrative   Not on file   Social Determinants of Health   Financial Resource Strain: Low Risk  (08/13/2022)   Overall Financial Resource Strain (CARDIA)    Difficulty of Paying Living Expenses: Not hard at all  Food Insecurity: No Food Insecurity (08/13/2022)   Hunger Vital Sign    Worried About Running Out of Food in the Last Year: Never true    [place] in the Last Year: Never true  Transportation Needs: No Transportation Needs  (08/13/2022)   PRAPARE - Hydrologist (Medical): No    Lack of Transportation (Non-Medical): No  Physical Activity: Inactive (08/13/2022)   Exercise Vital Sign    Days of Exercise per Week: 0 days    Minutes of Exercise per Session: 0 min  Stress: No Stress Concern Present (08/13/2022)   [place]    Feeling of Stress : Not at all  Social Connections: Moderately Isolated (08/10/2018)   Social Connection and Isolation Panel [NHANES]    Frequency of Communication with Friends and Family: More than three times a week  Frequency of Social Gatherings with Friends and Family: More than three times a week    Attends Religious Services: Never    Marine scientist or Organizations: No    Attends Archivist Meetings: Never    Marital Status: Divorced     Family History: The patient's family history includes Cancer in her sister; Heart attack in her mother; Heart disease in her mother and sister; Hypertension in her mother; Stroke in her sister.  ROS:   Please see the history of present illness.    Review of Systems  Constitutional:  Negative for chills and fever.  HENT:  Negative for sore throat.   Eyes:  Negative for blurred vision.  Respiratory:  Positive for shortness of breath.   Cardiovascular:  Positive for leg swelling. Negative for chest pain, palpitations, orthopnea, claudication and PND.  Gastrointestinal:  Negative for blood in stool, nausea and vomiting.  Genitourinary:  Negative for dysuria and hematuria.  Musculoskeletal:  Negative for falls.  Neurological:  Positive for dizziness. Negative for loss of consciousness.  Endo/Heme/Allergies:  Negative for polydipsia.  Psychiatric/Behavioral:  Negative for substance abuse.     EKGs/Labs/Other Studies Reviewed:    The following studies were reviewed today: Cardiac Monitor 02/02/20: Sinus bradycardia to sinus  rhythm, average heart rate 67 bpm. First-degree AV block, intermittent Wenckebach episodes while patient is sleeping. Rare PACs and PVCs.   The patient was in sinus bradycardia to sinus rhythm the entire monitoring time, first-degree AV block and intermittent nocturnal Wenckebach episodes, rare PACs and PVCs, no significant arrhythmias.   Echocardiogram 01/12/2020 EF 60-65, no RWMA, mild LVH, Gr 1 DD, trivial MR, AoV sclerosis (no AS)   Echocardiogram 12/21/2015 Mild LVH, EF 55-60, normal wall motion, GR 1 DD, MAC   Cardiac catheterization 12/20/2015 LAD ostial 25, mid 99; D2 ostial 40 LCx proximal 40 PCI: 2.5 x 20 mm Synergy DES to the mid LAD  EKG:  EKG is  ordered today.  SB with HR 59-personally reviewed  Recent Labs: 03/24/2022: TSH 1.840 04/11/2022: B Natriuretic Peptide 41.0; BUN 27; Creatinine, Ser 1.58; Hemoglobin 13.1; Platelets 189; Potassium 3.9; Sodium 139  Recent Lipid Panel    Component Value Date/Time   CHOL 117 03/24/2022 1535   TRIG 69 03/24/2022 1535   HDL 50 03/24/2022 1535   CHOLHDL 2.3 03/24/2022 1535   CHOLHDL 3.3 12/21/2015 0400   VLDL 15 12/21/2015 0400   LDLCALC 53 03/24/2022 1535      Physical Exam:    VS:  BP (!) 145/71 (BP Location: Left Arm, Patient Position: Sitting, Cuff Size: Large)   Pulse (!) 59   Ht 5\' 4"  (1.626 m)   Wt (!) 302 lb (137 kg)   BMI 51.84 kg/m     Wt Readings from Last 3 Encounters:  12/15/22 (!) 302 lb (137 kg)  08/13/22 299 lb (135.6 kg)  04/24/22 290 lb 3.2 oz (131.6 kg)     GEN:  Comfortable elderly female, NAD HEENT: Normal NECK: No JVD; No carotid bruits CARDIAC:RRR, no murmurs, rubs, gallops RESPIRATORY:  Clear to auscultation without rales, wheezing or rhonchi  ABDOMEN: Soft, non-tender, non-distended MUSCULOSKELETAL:  Trace pedal edema, warm SKIN: Warm and dry NEUROLOGIC:  Alert and oriented x 3 PSYCHIATRIC:  Normal affect   ASSESSMENT:    1. Coronary artery disease involving native coronary artery of  native heart without angina pectoris   2. Chronic heart failure with preserved ejection fraction   3. Essential hypertension   4. Hyperlipidemia  LDL goal <70   5. Primary hypertension   6. Stage 3a chronic kidney disease   7. Palpitations   8. Chronic diastolic CHF (congestive heart failure)    PLAN:    In order of problems listed above:  #CAD s/p PCI to mid LAD in 2017: Doing well with no anginal symptoms. -Continue ASA 81mg  daily -Continue lipitor 80mg  daily -Increase bystolic to 5.0mg  daily -Continue losartan 50mg  daily  #Chronic Diastolic Heart Failure: Doing well with NYHA class II symptoms. Trace pedal edema on exam.  -Continue lasix 40mg  daily -Continue bystolic 2.5mg  daily (did not tolerate 5mg  dose) -Continue losartan 50mg  daily -Continue ozempic 0.25mg  qweekly -Continue jardiance 10mg  daily  #Palpitations: Monitor showed episodes of wenkebach while sleeping with rare ectopy and no significant arrhythmias. Overall symptoms have improved. -Continue bystolic 2.5mg  daily (did not tolerate 5mg  dose)  #HTN: Elevated in office but better controlled at home. -Continue bystolic 2.5mg  daily (did not tolerate 5mg  dose) -Continue losartan 50mg  daily  #CKD Stage IIIb: Cr stable. -Continue lasix 40mg  daily as above  #HLD: Goal LDL<70 given known CAD s/p PCI. LDL currently 63. -Continue lipitor 80mg  daily and zetia 10mg  daily  #DMII on Insulin: -Continue tresidba -Continue ozempic 0.25mg  qweekly -Continue jardiance 10mg  daily    Medication Adjustments/Labs and Tests Ordered: Current medicines are reviewed at length with the patient today.  Concerns regarding medicines are outlined above.  Orders Placed This Encounter  Procedures   EKG 12-Lead   No orders of the defined types were placed in this encounter.   Patient Instructions  Medication Instructions:  Your physician recommends that you continue on your current medications as directed. Please refer to the  Current Medication list given to you today.  *If you need a refill on your cardiac medications before your next appointment, please call your pharmacy*  Lab Work: None ordered  Testing/Procedures: None ordered  Follow-Up: At [facility], you and your health needs are our priority.  As part of our continuing mission to provide you with exceptional heart care, we have created designated Provider Care Teams.  These Care Teams include your primary Cardiologist (physician) and Advanced Practice Providers (APPs -  Physician Assistants and Nurse Practitioners) who all work together to provide you with the care you need, when you need it.  Your next appointment:   6 month(s)  The format for your next appointment:   In Person  Provider:   Freada Bergeron, MD {     Signed, Sarah Bergeron, MD  12/15/2022 9:36 AM    [place]

## 2022-12-15 ENCOUNTER — Ambulatory Visit: Payer: 59 | Attending: Cardiology | Admitting: Cardiology

## 2022-12-15 ENCOUNTER — Encounter: Payer: Self-pay | Admitting: Cardiology

## 2022-12-15 VITALS — BP 145/71 | HR 59 | Ht 64.0 in | Wt 302.0 lb

## 2022-12-15 DIAGNOSIS — I5032 Chronic diastolic (congestive) heart failure: Secondary | ICD-10-CM

## 2022-12-15 DIAGNOSIS — I2510 Atherosclerotic heart disease of native coronary artery without angina pectoris: Secondary | ICD-10-CM

## 2022-12-15 DIAGNOSIS — E785 Hyperlipidemia, unspecified: Secondary | ICD-10-CM

## 2022-12-15 DIAGNOSIS — N1831 Chronic kidney disease, stage 3a: Secondary | ICD-10-CM

## 2022-12-15 DIAGNOSIS — I10 Essential (primary) hypertension: Secondary | ICD-10-CM | POA: Diagnosis not present

## 2022-12-15 DIAGNOSIS — R002 Palpitations: Secondary | ICD-10-CM | POA: Diagnosis not present

## 2022-12-15 NOTE — Patient Instructions (Addendum)
Medication Instructions:  Your physician recommends that you continue on your current medications as directed. Please refer to the Current Medication list given to you today.  *If you need a refill on your cardiac medications before your next appointment, please call your pharmacy*  Lab Work: None ordered  Testing/Procedures: None ordered  Follow-Up: At [facility], you and your health needs are our priority.  As part of our continuing mission to provide you with exceptional heart care, we have created designated Provider Care Teams.  These Care Teams include your primary Cardiologist (physician) and Advanced Practice Providers (APPs -  Physician Assistants and Nurse Practitioners) who all work together to provide you with the care you need, when you need it.  Your next appointment:   6 month(s)  The format for your next appointment:   In Person  Provider:   Freada Bergeron, MD {

## 2022-12-16 ENCOUNTER — Encounter: Payer: Self-pay | Admitting: Pharmacist

## 2022-12-23 ENCOUNTER — Encounter: Payer: Self-pay | Admitting: Nurse Practitioner

## 2022-12-23 ENCOUNTER — Ambulatory Visit (INDEPENDENT_AMBULATORY_CARE_PROVIDER_SITE_OTHER): Payer: 59 | Admitting: Nurse Practitioner

## 2022-12-23 VITALS — BP 150/90 | HR 62 | Temp 98.7°F | Ht 64.0 in | Wt 301.2 lb

## 2022-12-23 DIAGNOSIS — I10 Essential (primary) hypertension: Secondary | ICD-10-CM | POA: Diagnosis not present

## 2022-12-23 DIAGNOSIS — Z23 Encounter for immunization: Secondary | ICD-10-CM | POA: Diagnosis not present

## 2022-12-23 DIAGNOSIS — E782 Mixed hyperlipidemia: Secondary | ICD-10-CM

## 2022-12-23 DIAGNOSIS — Z1159 Encounter for screening for other viral diseases: Secondary | ICD-10-CM

## 2022-12-23 DIAGNOSIS — Z1321 Encounter for screening for nutritional disorder: Secondary | ICD-10-CM | POA: Diagnosis not present

## 2022-12-23 DIAGNOSIS — N1831 Chronic kidney disease, stage 3a: Secondary | ICD-10-CM

## 2022-12-23 DIAGNOSIS — E1122 Type 2 diabetes mellitus with diabetic chronic kidney disease: Secondary | ICD-10-CM | POA: Diagnosis not present

## 2022-12-23 DIAGNOSIS — N1832 Chronic kidney disease, stage 3b: Secondary | ICD-10-CM

## 2022-12-23 DIAGNOSIS — I129 Hypertensive chronic kidney disease with stage 1 through stage 4 chronic kidney disease, or unspecified chronic kidney disease: Secondary | ICD-10-CM | POA: Diagnosis not present

## 2022-12-23 DIAGNOSIS — Z6841 Body Mass Index (BMI) 40.0 and over, adult: Secondary | ICD-10-CM

## 2022-12-23 NOTE — Progress Notes (Addendum)
I,Sheena H Holbrook,acting as a Neurosurgeon for [name], FNP.,have documented all relevant documentation on the behalf of [name], FNP,as directed by  [name], FNP while in the presence of [name], FNP.   Subjective:     Patient ID: Sarah Weaver , female    DOB: 07/01/1936 , 87 y.o.   MRN: [id number]   Chief Complaint  Patient presents with   Annual Exam    HPI  Patient declines physical (CPE) due to having an appt at 1230p. She is due to f/u with cardiology in October and has seen Nephrologist in January and is to f/u in one year.    Diabetes She presents for her follow-up diabetic visit. She has type 2 diabetes mellitus. There are no hypoglycemic associated symptoms. There are no diabetic associated symptoms. There are no hypoglycemic complications. Diabetic complications include nephropathy. Pertinent negatives for diabetic complications include no heart disease. Risk factors for coronary artery disease include diabetes mellitus, obesity and sedentary lifestyle. Current diabetic treatment includes oral agent (dual therapy). She is following a generally healthy diet. When asked about meal planning, she reported none. (Admits to not checking her blood sugar regularly)  Hypertension This is a chronic problem. The current episode started more than 1 year ago. The problem is unchanged. The problem is controlled. Pertinent negatives include no anxiety. Risk factors for coronary artery disease include obesity and sedentary lifestyle. Past treatments include diuretics. Compliance problems include exercise.  Hypertensive end-organ damage includes kidney disease. There is no history of angina. Identifiable causes of hypertension include chronic renal disease and a thyroid problem.     Past Medical History:  Diagnosis Date   Arthritis    "all over"   Chronic lower back pain    CKD Stage III 07/26/2018   Hx of hyperkalemia   Coronary artery disease    a. status  post DES to the LAD 12/2015.   Glaucoma    HFpEF    Echocardiogram 12/2019: EF 60-65, no RWMA, mild LVH, Gr 1 DD, trivial MR, mild AoV sclerosis, no AS   Hyperkalemia    Hyperlipidemia    Hypertension    Mobitz (type) I (Wenckebach's) atrioventricular block 10/15/2021   Palpitations Zio Patch Monitor 5/21 NSR, avg HR 67, 1st degree AV block, intermittent Wenckebach during sleep, rare PACs/PVCs   Morbid obesity    Palpitations    Type II diabetes mellitus      Family History  Problem Relation Age of Onset   Heart disease Mother    Heart attack Mother    Hypertension Mother    Heart disease Sister    Cancer Sister    Stroke Sister      Current Outpatient Medications:    Accu-Chek Softclix Lancets lancets, USE UP TO 4 TIMES DAILY AS DIRECTED, Disp: 100 each, Rfl: 3   aspirin EC 81 MG tablet, Take 81 mg by mouth daily., Disp: , Rfl:    atorvastatin (LIPITOR) 80 MG tablet, TAKE 1 TABLET BY MOUTH EVERYDAY AT BEDTIME, Disp: 90 tablet, Rfl: 2   BD PEN NEEDLE NANO 2ND GEN 32G X 4 MM MISC, USE NIGHTLY AS DIRECTED, Disp: 100 each, Rfl: 3   blood glucose meter kit and supplies KIT, Dispense based on patient and insurance preference. Use up to four times daily as directed. (FOR ICD-9 250.00, 250.01)., Disp: 1 each, Rfl: 0   Cholecalciferol 125 MCG (5000 UT) TABS, Take 1 tablet by mouth daily., Disp: , Rfl:    empagliflozin (JARDIANCE)  10 MG TABS tablet, Take 1 tablet (10 mg total) by mouth daily before breakfast., Disp: 90 tablet, Rfl: 2   ezetimibe (ZETIA) 10 MG tablet, Take 1 tablet (10 mg total) by mouth daily., Disp: 90 tablet, Rfl: 3   furosemide (LASIX) 40 MG tablet, Take 1 tablet (40 mg total) by mouth daily., Disp: 90 tablet, Rfl: 3   insulin degludec (TRESIBA FLEXTOUCH) 100 UNIT/ML FlexTouch Pen, INJECT 10 UNITS INTO THE SKIN DAILY, Disp: 15 mL, Rfl: 3   Latanoprostene Bunod 0.024 % SOLN, Apply 1 drop to eye daily., Disp: , Rfl:    levothyroxine (SYNTHROID) 50 MCG tablet, TAKE 1 TABLET  BY MOUTH EVERY DAY BEFORE BREAKFAST, Disp: 90 tablet, Rfl: 1   LINZESS 145 MCG CAPS capsule, TAKE 1 CAPSULE (145 MCG TOTAL) BY MOUTH DAILY BEFORE BREAKFAST. FOR CONSTIPATION, Disp: 90 capsule, Rfl: 0   losartan (COZAAR) 50 MG tablet, Take 50 mg by mouth at bedtime., Disp: , Rfl:    Magnesium 250 MG TABS, Take 1 tablet (250 mg total) by mouth every evening. Take with evening meal, Disp: 90 tablet, Rfl: 1   nebivolol (BYSTOLIC) 2.5 MG tablet, Take 1 tablet (2.5 mg total) by mouth daily., Disp: 90 tablet, Rfl: 3   nystatin (NYSTATIN) powder, Apply 1 application topically 3 (three) times daily., Disp: 15 g, Rfl: 0   ONETOUCH VERIO test strip, CHECK BLOOD SUGARS ONCE DAILY, Disp: 50 strip, Rfl: 11   timolol (BETIMOL) 0.5 % ophthalmic solution, 1 drop 2 (two) times daily., Disp: , Rfl:    traMADol-acetaminophen (ULTRACET) 37.5-325 MG tablet, TAKE 1 TABLET BY MOUTH EVERY 6 HOURS AS NEEDED FOR MODERATE BACK PAIN, Disp: 20 tablet, Rfl: 0   Allergies  Allergen Reactions   Carvedilol Cough    Pt reports causes her to cough   Lisinopril Cough    REPORTS CAUSES DRY COUGH     Review of Systems  Constitutional: Negative.   Respiratory: Negative.    Cardiovascular: Negative.   Gastrointestinal: Negative.   Neurological: Negative.      Today's Vitals   12/23/22 1136  BP: (!) 150/90  Pulse: 62  Temp: 98.7 F (37.1 C)  TempSrc: Oral  SpO2: 96%  Weight: (!) 301 lb 3.2 oz (136.6 kg)  Height: 5\' 4"  (1.626 m)   Body mass index is 51.7 kg/m.   Objective:  Physical Exam Vitals reviewed.  Constitutional:      General: She is not in acute distress.    Appearance: Normal appearance. She is well-developed. She is obese.  HENT:     Head: Normocephalic and atraumatic.  Eyes:     Pupils: Pupils are equal, round, and reactive to light.  Cardiovascular:     Rate and Rhythm: Normal rate and regular rhythm.     Pulses: Normal pulses.     Heart sounds: Normal heart sounds. No murmur  heard. Pulmonary:     Effort: Pulmonary effort is normal.     Breath sounds: Normal breath sounds. No wheezing.  Musculoskeletal:        General: Normal range of motion.  Skin:    General: Skin is warm and dry.     Capillary Refill: Capillary refill takes less than 2 seconds.  Neurological:     General: No focal deficit present.     Mental Status: She is alert and oriented to person, place, and time.     Cranial Nerves: No cranial nerve deficit.     Motor: No weakness.  Psychiatric:  Mood and Affect: Mood normal.        Thought Content: Thought content normal.        Judgment: Judgment normal.         Assessment And Plan:     1. Essential hypertension Comments: Blood pressure is elevated, advised to take medications as directed. - CMP14+EGFR  2. Type 2 diabetes mellitus with stage 3b chronic kidney disease, without long-term current use of insulin Comments: DM is fairly controlled, she is not wanting to continue with the Ozempic due to this being an injection. pending labs will consider another option if possible - Hemoglobin A1c - CMP14+EGFR  3. Mixed hyperlipidemia Comments: Cholesterol levels are fairly controlled. Continue statin, tolerating well - Lipid panel  4. Class 3 severe obesity due to excess calories with serious comorbidity and body mass index (BMI) of 50.0 to 59.9 in adult  5. Need for hepatitis C screening test Will check Hepatitis C screening due to recent recommendations to screen all adults 18 years and older - Hepatitis C antibody  6. Need for pneumococcal vaccination Prevnar 20 given in office - Pneumococcal conjugate vaccine 20-valent (Prevnar 20)  7. Encounter for vitamin deficiency screening   Patient was given opportunity to ask questions. Patient verbalized understanding of the plan and was able to repeat key elements of the plan. All questions were answered to their satisfaction.   [name], FNP   I, [name], FNP, have  reviewed all documentation for this visit. The documentation on 12/23/22 for the exam, diagnosis, procedures, and orders are all accurate and complete.   THE PATIENT IS ENCOURAGED TO PRACTICE SOCIAL DISTANCING DUE TO THE COVID-19 PANDEMIC.

## 2022-12-24 ENCOUNTER — Encounter: Payer: Self-pay | Admitting: Nurse Practitioner

## 2022-12-24 LAB — CMP14+EGFR
ALT: 25 IU/L (ref 0–32)
AST: 22 IU/L (ref 0–40)
Albumin/Globulin Ratio: 1.5 (ref 1.2–2.2)
Albumin: 4.1 g/dL (ref 3.7–4.7)
Alkaline Phosphatase: 125 IU/L — ABNORMAL HIGH (ref 44–121)
BUN/Creatinine Ratio: 19 (ref 12–28)
BUN: 27 mg/dL (ref 8–27)
Bilirubin Total: 0.5 mg/dL (ref 0.0–1.2)
CO2: 22 mmol/L (ref 20–29)
Calcium: 10 mg/dL (ref 8.7–10.3)
Chloride: 100 mmol/L (ref 96–106)
Creatinine, Ser: 1.42 mg/dL — ABNORMAL HIGH (ref 0.57–1.00)
Globulin, Total: 2.8 g/dL (ref 1.5–4.5)
Glucose: 226 mg/dL — ABNORMAL HIGH (ref 70–99)
Potassium: 4.6 mmol/L (ref 3.5–5.2)
Sodium: 139 mmol/L (ref 134–144)
Total Protein: 6.9 g/dL (ref 6.0–8.5)
eGFR: 36 mL/min/{1.73_m2} — ABNORMAL LOW (ref >59)

## 2022-12-24 LAB — HEPATITIS C ANTIBODY: Hep C Virus Ab: NONREACTIVE

## 2022-12-24 LAB — LIPID PANEL
Chol/HDL Ratio: 2.3 ratio (ref 0.0–4.4)
Cholesterol, Total: 125 mg/dL (ref 100–199)
Cholesterol: 125 (ref 0–200)
HDL: 54 (ref 35–70)
HDL: 54 mg/dL (ref >39)
LDL Chol Calc (NIH): 54 mg/dL (ref 0–99)
LDL Cholesterol: 54
LDl/HDL Ratio: 2.3
Triglycerides: 89 (ref 40–160)
Triglycerides: 89 mg/dL (ref 0–149)
VLDL Cholesterol Cal: 17 mg/dL (ref 5–40)

## 2022-12-24 LAB — HEPATIC FUNCTION PANEL
ALT: 25 U/L (ref 7–35)
AST: 22 (ref 13–35)
Alkaline Phosphatase: 125 (ref 25–125)
Bilirubin, Total: 0.5

## 2022-12-24 LAB — COMPREHENSIVE METABOLIC PANEL
Albumin: 4.1 (ref 3.5–5.0)
Calcium: 10 (ref 8.7–10.7)
Globulin: 2.8
eGFR: 36

## 2022-12-24 LAB — BASIC METABOLIC PANEL
CO2: 22 (ref 13–22)
Chloride: 100 (ref 99–108)
Creatinine: 1.4 — AB (ref 0.5–1.1)
Glucose: 226
Potassium: 4.6 mEq/L (ref 3.5–5.1)
Sodium: 139 (ref 137–147)

## 2022-12-24 LAB — HEMOGLOBIN A1C
Est. average glucose Bld gHb Est-mCnc: 235 mg/dL
Hgb A1c MFr Bld: 9.8 % — ABNORMAL HIGH (ref 4.8–5.6)

## 2022-12-24 LAB — HM HEPATITIS C SCREENING LAB: HM Hepatitis Screen: NEGATIVE

## 2022-12-24 LAB — MICROALBUMIN / CREATININE URINE RATIO: Microalb Creat Ratio: 19

## 2023-01-11 ENCOUNTER — Other Ambulatory Visit: Payer: Self-pay | Admitting: Nurse Practitioner

## 2023-01-21 ENCOUNTER — Other Ambulatory Visit: Payer: Self-pay | Admitting: Nurse Practitioner

## 2023-03-06 ENCOUNTER — Other Ambulatory Visit: Payer: Self-pay

## 2023-03-06 MED ORDER — EMPAGLIFLOZIN 10 MG PO TABS: 10.0000 mg | ORAL_TABLET | Freq: Every day | ORAL | 3 refills | Status: DC

## 2023-03-17 ENCOUNTER — Ambulatory Visit: Payer: 59 | Admitting: Cardiology

## 2023-05-12 NOTE — Progress Notes (Signed)
Madelaine Bhat, CMA,acting as a Neurosurgeon for [name], FNP.,have documented all relevant documentation on the behalf of [name], FNP,as directed by  [name], FNP while in the presence of [name], FNP.  Subjective:  Patient ID: Sarah Weaver , female    DOB: 1936-05-12 , 87 y.o.   MRN: [id number]  Chief Complaint  Patient presents with   Diabetes   Hypertension   Hyperlipidemia    HPI  Patient presents today for a BP, DM and Chol follow up, patient reports compliance with medications. Patient denies any chest pain, SOB, or headaches. Patient has no concerns today. Reports her left arm was red and had a rash after getting her pneumonia vaccine. She drank green tea and the symptoms resolved.  She has not been to the eye doctor will call one for an appt  BP Readings from Last 3 Encounters: 05/13/23 : (!) 150/80 12/23/22 : (!) 150/90 12/15/22 : (!) 145/71    Diabetes She presents for her follow-up diabetic visit. She has type 2 diabetes mellitus. There are no hypoglycemic associated symptoms. There are no diabetic associated symptoms. There are no hypoglycemic complications. Diabetic complications include nephropathy. Pertinent negatives for diabetic complications include no heart disease. Risk factors for coronary artery disease include diabetes mellitus, obesity and sedentary lifestyle. Current diabetic treatment includes oral agent (dual therapy). She is following a generally healthy diet. When asked about meal planning, she reported none. (Admits to not checking her blood sugar regularly)  Hypertension This is a chronic problem. The current episode started more than 1 year ago. The problem is unchanged. The problem is controlled. Pertinent negatives include no anxiety. Risk factors for coronary artery disease include obesity and sedentary lifestyle. Past treatments include diuretics. Compliance problems include exercise.  Hypertensive end-organ damage includes  kidney disease. There is no history of angina. Identifiable causes of hypertension include chronic renal disease and a thyroid problem.     Past Medical History:  Diagnosis Date   Arthritis    "all over"   Chronic lower back pain    CKD Stage III 07/26/2018   Hx of hyperkalemia   Coronary artery disease    a. status post DES to the LAD 12/2015.   Glaucoma    HFpEF    Echocardiogram 12/2019: EF 60-65, no RWMA, mild LVH, Gr 1 DD, trivial MR, mild AoV sclerosis, no AS   Hyperkalemia    Hyperlipidemia    Hypertension    Mobitz (type) I (Wenckebach's) atrioventricular block 10/15/2021   Palpitations Zio Patch Monitor 5/21 NSR, avg HR 67, 1st degree AV block, intermittent Wenckebach during sleep, rare PACs/PVCs   Morbid obesity (HCC)    Palpitations    Type II diabetes mellitus (HCC)      Family History  Problem Relation Age of Onset   Heart disease Mother    Heart attack Mother    Hypertension Mother    Heart disease Sister    Cancer Sister    Stroke Sister      Current Outpatient Medications:    Accu-Chek Softclix Lancets lancets, USE UP TO 4 TIMES DAILY AS DIRECTED, Disp: 100 each, Rfl: 3   aspirin EC 81 MG tablet, Take 81 mg by mouth daily., Disp: , Rfl:    atorvastatin (LIPITOR) 80 MG tablet, TAKE 1 TABLET BY MOUTH EVERYDAY AT BEDTIME, Disp: 90 tablet, Rfl: 2   BD PEN NEEDLE NANO 2ND GEN 32G X 4 MM MISC, USE NIGHTLY AS DIRECTED, Disp: 100 each, Rfl:  3   blood glucose meter kit and supplies KIT, Dispense based on patient and insurance preference. Use up to four times daily as directed. (FOR ICD-9 250.00, 250.01)., Disp: 1 each, Rfl: 0   Cholecalciferol 125 MCG (5000 UT) TABS, Take 1 tablet by mouth daily., Disp: , Rfl:    empagliflozin (JARDIANCE) 10 MG TABS tablet, Take 1 tablet (10 mg total) by mouth daily before breakfast., Disp: 90 tablet, Rfl: 3   ezetimibe (ZETIA) 10 MG tablet, Take 1 tablet (10 mg total) by mouth daily., Disp: 90 tablet, Rfl: 3   furosemide (LASIX) 40  MG tablet, Take 1 tablet (40 mg total) by mouth daily., Disp: 90 tablet, Rfl: 3   insulin degludec (TRESIBA FLEXTOUCH) 100 UNIT/ML FlexTouch Pen, INJECT 10 UNITS INTO THE SKIN DAILY, Disp: 15 mL, Rfl: 3   Latanoprostene Bunod 0.024 % SOLN, Apply 1 drop to eye daily., Disp: , Rfl:    levothyroxine (SYNTHROID) 50 MCG tablet, TAKE 1 TABLET BY MOUTH EVERY DAY BEFORE BREAKFAST, Disp: 90 tablet, Rfl: 1   LINZESS 145 MCG CAPS capsule, TAKE 1 CAPSULE (145 MCG TOTAL) BY MOUTH DAILY BEFORE BREAKFAST. FOR CONSTIPATION, Disp: 90 capsule, Rfl: 0   losartan (COZAAR) 50 MG tablet, Take 50 mg by mouth at bedtime., Disp: , Rfl:    Magnesium 250 MG TABS, Take 1 tablet (250 mg total) by mouth every evening. Take with evening meal, Disp: 90 tablet, Rfl: 1   nebivolol (BYSTOLIC) 2.5 MG tablet, Take 1 tablet (2.5 mg total) by mouth daily., Disp: 90 tablet, Rfl: 3   ONETOUCH VERIO test strip, CHECK BLOOD SUGARS ONCE DAILY, Disp: 50 strip, Rfl: 11   timolol (BETIMOL) 0.5 % ophthalmic solution, 1 drop 2 (two) times daily., Disp: , Rfl:    nystatin powder, Apply 1 Application topically 3 (three) times daily., Disp: 60 g, Rfl: 1   traMADol-acetaminophen (ULTRACET) 37.5-325 MG tablet, TAKE 1 TABLET BY MOUTH EVERY 6 HOURS AS NEEDED FOR MODERATE BACK PAIN, Disp: 20 tablet, Rfl: 0   Allergies  Allergen Reactions   Carvedilol Cough    Pt reports causes her to cough   Lisinopril Cough    REPORTS CAUSES DRY COUGH     Review of Systems  Constitutional: Negative.   HENT: Negative.    Eyes: Negative.   Respiratory: Negative.    Cardiovascular: Negative.   Gastrointestinal: Negative.   Endocrine: Negative.   Genitourinary: Negative.   Musculoskeletal:  Positive for back pain (this is chronic, she has taken tramadol in the past but has not asked for any in a while. Tylenol has been ineffective).  Skin: Negative.   Allergic/Immunologic: Negative.   Neurological: Negative.   Hematological: Negative.    Psychiatric/Behavioral: Negative.       Today's Vitals   05/13/23 1047 05/13/23 1139  BP: (!) 150/80 130/64  Pulse: 67   Temp: 98.7 F (37.1 C)   TempSrc: Oral   Weight: 297 lb 6.4 oz (134.9 kg)   Height: 5\' 4"  (1.626 m)    Body mass index is 51.05 kg/m.  Wt Readings from Last 3 Encounters:  05/13/23 297 lb 6.4 oz (134.9 kg)  12/23/22 (!) 301 lb 3.2 oz (136.6 kg)  12/15/22 (!) 302 lb (137 kg)     Objective:  Physical Exam Vitals reviewed.  Constitutional:      General: She is not in acute distress.    Appearance: Normal appearance. She is well-developed. She is obese.  HENT:     Head: Normocephalic and  atraumatic.  Eyes:     Pupils: Pupils are equal, round, and reactive to light.  Cardiovascular:     Rate and Rhythm: Normal rate and regular rhythm.     Pulses: Normal pulses.     Heart sounds: Normal heart sounds. No murmur heard. Pulmonary:     Effort: Pulmonary effort is normal.     Breath sounds: Normal breath sounds. No wheezing.  Musculoskeletal:        General: Normal range of motion.  Skin:    General: Skin is warm and dry.     Capillary Refill: Capillary refill takes less than 2 seconds.  Neurological:     General: No focal deficit present.     Mental Status: She is alert and oriented to person, place, and time.     Cranial Nerves: No cranial nerve deficit.     Motor: No weakness.  Psychiatric:        Mood and Affect: Mood normal.        Thought Content: Thought content normal.        Judgment: Judgment normal.         Assessment And Plan:  Hypertensive heart disease with other congestive heart failure (HCC) -     Basic metabolic panel  Type 2 diabetes mellitus with stage 3b chronic kidney disease, without long-term current use of insulin (HCC) Assessment & Plan: HgbA1c was elevated at last visit, she has not been taking Ozempic at this time, will restart. Sample given in office.   Orders: -     Hemoglobin A1c  Mixed  hyperlipidemia Assessment & Plan: Cholesterol levels are stable, continue current medications.   Orders: -     Lipid panel  Chronic bilateral low back pain without sciatica Assessment & Plan: Will refill Tramadol for intermittent chronic back pain.   Orders: -     traMADol-Acetaminophen; TAKE 1 TABLET BY MOUTH EVERY 6 HOURS AS NEEDED FOR MODERATE BACK PAIN  Dispense: 20 tablet; Refill: 0  Class 3 severe obesity due to excess calories with serious comorbidity and body mass index (BMI) of 50.0 to 59.9 in adult [facility]) Assessment & Plan: She is encouraged to strive for BMI less than 30 to decrease cardiac risk. Advised to aim for at least 150 minutes of exercise per week.    Herpes zoster vaccination declined Assessment & Plan: Declines shingrix, educated on disease process and is aware if he changes his mind to notify office    Need for influenza vaccination Assessment & Plan: Influenza vaccine administered Encouraged to take Tylenol as needed for fever or muscle aches.   Orders: -     Flu Vaccine Trivalent High Dose (Fluad)  Other orders -     Nystatin; Apply 1 Application topically 3 (three) times daily.  Dispense: 60 g; Refill: 1    Return for controlled DM check 4 months.  Patient was given opportunity to ask questions. Patient verbalized understanding of the plan and was able to repeat key elements of the plan. All questions were answered to their satisfaction.   Jeanell Sparrow, FNP, have reviewed all documentation for this visit. The documentation on 05/13/23 for the exam, diagnosis, procedures, and orders are all accurate and complete.    IF YOU HAVE BEEN REFERRED TO A SPECIALIST, IT MAY TAKE 1-2 WEEKS TO SCHEDULE/PROCESS THE REFERRAL. IF YOU HAVE NOT HEARD FROM US/SPECIALIST IN TWO WEEKS, PLEASE GIVE [place] A CALL AT [phone].

## 2023-05-13 ENCOUNTER — Ambulatory Visit (INDEPENDENT_AMBULATORY_CARE_PROVIDER_SITE_OTHER): Payer: 59 | Admitting: Nurse Practitioner

## 2023-05-13 ENCOUNTER — Encounter: Payer: Self-pay | Admitting: Nurse Practitioner

## 2023-05-13 VITALS — BP 130/64 | HR 67 | Temp 98.7°F | Ht 64.0 in | Wt 297.4 lb

## 2023-05-13 DIAGNOSIS — M545 Low back pain, unspecified: Secondary | ICD-10-CM | POA: Diagnosis not present

## 2023-05-13 DIAGNOSIS — Z23 Encounter for immunization: Secondary | ICD-10-CM

## 2023-05-13 DIAGNOSIS — Z2821 Immunization not carried out because of patient refusal: Secondary | ICD-10-CM | POA: Diagnosis not present

## 2023-05-13 DIAGNOSIS — N1832 Chronic kidney disease, stage 3b: Secondary | ICD-10-CM | POA: Diagnosis not present

## 2023-05-13 DIAGNOSIS — G8929 Other chronic pain: Secondary | ICD-10-CM | POA: Diagnosis not present

## 2023-05-13 DIAGNOSIS — I110 Hypertensive heart disease with heart failure: Secondary | ICD-10-CM | POA: Diagnosis not present

## 2023-05-13 DIAGNOSIS — I10 Essential (primary) hypertension: Secondary | ICD-10-CM

## 2023-05-13 DIAGNOSIS — Z6841 Body Mass Index (BMI) 40.0 and over, adult: Secondary | ICD-10-CM

## 2023-05-13 DIAGNOSIS — E782 Mixed hyperlipidemia: Secondary | ICD-10-CM | POA: Diagnosis not present

## 2023-05-13 DIAGNOSIS — E1122 Type 2 diabetes mellitus with diabetic chronic kidney disease: Secondary | ICD-10-CM | POA: Diagnosis not present

## 2023-05-13 DIAGNOSIS — I5089 Other heart failure: Secondary | ICD-10-CM | POA: Diagnosis not present

## 2023-05-13 MED ORDER — TRAMADOL-ACETAMINOPHEN 37.5-325 MG PO TABS
ORAL_TABLET | ORAL | 0 refills | Status: AC
Start: 2023-05-13 — End: ?

## 2023-05-13 MED ORDER — NYSTATIN 100000 UNIT/GM EX POWD: 1.0000 | Freq: Three times a day (TID) | CUTANEOUS | 1 refills | Status: DC

## 2023-05-14 LAB — BASIC METABOLIC PANEL
BUN/Creatinine Ratio: 20 (ref 12–28)
BUN: 28 mg/dL — ABNORMAL HIGH (ref 8–27)
CO2: 21 mmol/L (ref 20–29)
Calcium: 9.5 mg/dL (ref 8.7–10.3)
Chloride: 101 mmol/L (ref 96–106)
Creatinine, Ser: 1.4 mg/dL — ABNORMAL HIGH (ref 0.57–1.00)
Glucose: 189 mg/dL — ABNORMAL HIGH (ref 70–99)
Potassium: 4.9 mmol/L (ref 3.5–5.2)
Sodium: 138 mmol/L (ref 134–144)
eGFR: 37 mL/min/{1.73_m2} — ABNORMAL LOW (ref >59)

## 2023-05-14 LAB — LIPID PANEL
Chol/HDL Ratio: 2.5 ratio (ref 0.0–4.4)
Cholesterol, Total: 124 mg/dL (ref 100–199)
HDL: 50 mg/dL (ref >39)
LDL Chol Calc (NIH): 52 mg/dL (ref 0–99)
Triglycerides: 123 mg/dL (ref 0–149)
VLDL Cholesterol Cal: 22 mg/dL (ref 5–40)

## 2023-05-14 LAB — HEMOGLOBIN A1C
Est. average glucose Bld gHb Est-mCnc: 232 mg/dL
Hgb A1c MFr Bld: 9.7 % — ABNORMAL HIGH (ref 4.8–5.6)

## 2023-05-24 DIAGNOSIS — G8929 Other chronic pain: Secondary | ICD-10-CM | POA: Insufficient documentation

## 2023-05-24 DIAGNOSIS — Z23 Encounter for immunization: Secondary | ICD-10-CM | POA: Insufficient documentation

## 2023-05-24 DIAGNOSIS — Z2821 Immunization not carried out because of patient refusal: Secondary | ICD-10-CM | POA: Insufficient documentation

## 2023-05-24 DIAGNOSIS — I129 Hypertensive chronic kidney disease with stage 1 through stage 4 chronic kidney disease, or unspecified chronic kidney disease: Secondary | ICD-10-CM | POA: Insufficient documentation

## 2023-05-24 DIAGNOSIS — I119 Hypertensive heart disease without heart failure: Secondary | ICD-10-CM | POA: Insufficient documentation

## 2023-05-24 NOTE — Assessment & Plan Note (Signed)
Will refill Tramadol for intermittent chronic back pain.

## 2023-05-24 NOTE — Assessment & Plan Note (Signed)
Declines shingrix, educated on disease process and is aware if he changes his mind to notify office  

## 2023-05-24 NOTE — Assessment & Plan Note (Signed)
She is encouraged to strive for BMI less than 30 to decrease cardiac risk. Advised to aim for at least 150 minutes of exercise per week.  

## 2023-05-24 NOTE — Assessment & Plan Note (Signed)
Influenza vaccine administered Encouraged to take Tylenol as needed for fever or muscle aches.

## 2023-05-24 NOTE — Assessment & Plan Note (Signed)
Cholesterol levels are stable, continue current medications

## 2023-05-24 NOTE — Assessment & Plan Note (Signed)
HgbA1c was elevated at last visit, she has not been taking Ozempic at this time, will restart. Sample given in office.

## 2023-06-21 ENCOUNTER — Encounter: Payer: Self-pay | Admitting: Cardiovascular Disease

## 2023-06-21 NOTE — Progress Notes (Unsigned)
Cardiology Office Note:    Date:  06/22/2023   ID:  Christoper Allegra, DOB [birth year], MRN [id number]  PCP:  [name], FNP  [facility] Cardiologist: Nancee Liter,  Kristeen Miss, MD  [facility] Electrophysiologist:  None   Referring MD: [name], FNP   Chief Complaint  Patient presents with   Coronary Artery Disease          Aug. 9, 2021   Sarah Weaver is a 87 y.o. female with a hx of coronary artery disease, status post DES to the LAD in April, 2017, hypertension, type 2 diabetes mellitus, hyperlipidemia, morbid obesity, chronic kidney disease stage III, chronic diastolic congestive heart failure.  We are asked to see her today for a work in visit because of further episodes of chest pain and shortness of breath.  She has chronic leg edema, especially if she is late taking her Lasix.  She has a history of morbid obesity.  Her weight is 305 pounds at her last office visit.  Wt is 303 lbs today .  She has essential hypertension.  Last week , she had some tightness in her chest . Not similar to her previous episodes of chest tightness prior to her stenting  Not related to eating or drinking Tightness will last off and on for several days  The tightness is related to position.  Will have the tightness when she lies on one side and then it will go away when she turns to the other side.   Walks with a waker due to back injury .  Has occasional palpitations ( hard heart beats - may last for 1-2 seconds)   She has a constant soreness - has been present for days  Also has DOE with walking .   This DOE does not correspond to her episodes of chest tighness. I have personally reviewed the cath angio.   The last image is of the stent being positioned - I do not see any post stent images.    Oct. 7, 2024  Sarah Weaver is seen for follow up of her CAD  S/p stenting of her LAD in 2017 Hx of morbid obesity , CKD, chronic diastolic CHF  No CP or  dyspnea   Wt is 300 lbs today ( down 3 lbs)  No Cp , no dyspnea            Past Medical History:  Diagnosis Date   Arthritis    "all over"   Chronic lower back pain    CKD Stage III 07/26/2018   Hx of hyperkalemia   Coronary artery disease    a. status post DES to the LAD 12/2015.   Glaucoma    HFpEF    Echocardiogram 12/2019: EF 60-65, no RWMA, mild LVH, Gr 1 DD, trivial MR, mild AoV sclerosis, no AS   Hyperkalemia    Hyperlipidemia    Hypertension    Mobitz (type) I (Wenckebach's) atrioventricular block 10/15/2021   Palpitations Zio Patch Monitor 5/21 NSR, avg HR 67, 1st degree AV block, intermittent Wenckebach during sleep, rare PACs/PVCs   Morbid obesity (HCC)    Palpitations    Type II diabetes mellitus (HCC)     Past Surgical History:  Procedure Laterality Date   CARDIAC CATHETERIZATION N/A 12/20/2015   Procedure: Left Heart Cath and Coronary Angiography;  Surgeon: Runell Gess, MD;  Location: [facility] INVASIVE CV LAB;  Service: Cardiovascular;  Laterality: N/A;   CARDIAC CATHETERIZATION N/A 12/20/2015   Procedure: Coronary  Stent Intervention;  Surgeon: Runell Gess, MD;  Location: [facility] INVASIVE CV LAB;  Service: Cardiovascular;  Laterality: N/A;  mid LAD 2.5 x 20 Synergy   CATARACT EXTRACTION W/ INTRAOCULAR LENS  IMPLANT, BILATERAL Bilateral    CORONARY ANGIOPLASTY WITH STENT PLACEMENT  12/20/2015   LAPAROSCOPIC CHOLECYSTECTOMY  04/23/11   TONSILLECTOMY      Current Medications: Current Meds  Medication Sig   Accu-Chek Softclix Lancets lancets USE UP TO 4 TIMES DAILY AS DIRECTED   aspirin EC 81 MG tablet Take 81 mg by mouth daily.   atorvastatin (LIPITOR) 80 MG tablet TAKE 1 TABLET BY MOUTH EVERYDAY AT BEDTIME   BD PEN NEEDLE NANO 2ND GEN 32G X 4 MM MISC USE NIGHTLY AS DIRECTED   blood glucose meter kit and supplies KIT Dispense based on patient and insurance preference. Use up to four times daily as directed. (FOR ICD-9 250.00, 250.01).   Cholecalciferol 125 MCG  (5000 UT) TABS Take 1 tablet by mouth daily.   empagliflozin (JARDIANCE) 10 MG TABS tablet Take 1 tablet (10 mg total) by mouth daily before breakfast.   ezetimibe (ZETIA) 10 MG tablet Take 1 tablet (10 mg total) by mouth daily.   furosemide (LASIX) 40 MG tablet Take 1 tablet (40 mg total) by mouth daily.   insulin degludec (TRESIBA FLEXTOUCH) 100 UNIT/ML FlexTouch Pen INJECT 10 UNITS INTO THE SKIN DAILY   Latanoprostene Bunod 0.024 % SOLN Apply 1 drop to eye daily.   levothyroxine (SYNTHROID) 50 MCG tablet TAKE 1 TABLET BY MOUTH EVERY DAY BEFORE BREAKFAST   LINZESS 145 MCG CAPS capsule TAKE 1 CAPSULE (145 MCG TOTAL) BY MOUTH DAILY BEFORE BREAKFAST. FOR CONSTIPATION   losartan (COZAAR) 50 MG tablet Take 50 mg by mouth at bedtime.   Magnesium 250 MG TABS Take 1 tablet (250 mg total) by mouth every evening. Take with evening meal   nebivolol (BYSTOLIC) 2.5 MG tablet Take 1 tablet (2.5 mg total) by mouth daily.   nystatin powder Apply 1 Application topically 3 (three) times daily.   ONETOUCH VERIO test strip CHECK BLOOD SUGARS ONCE DAILY   timolol (BETIMOL) 0.5 % ophthalmic solution 1 drop 2 (two) times daily.   traMADol-acetaminophen (ULTRACET) 37.5-325 MG tablet TAKE 1 TABLET BY MOUTH EVERY 6 HOURS AS NEEDED FOR MODERATE BACK PAIN     Allergies:   Carvedilol and Lisinopril   Social History   Socioeconomic History   Marital status: Divorced    Spouse name: Not on file   Number of children: Not on file   Years of education: Not on file   Highest education level: Not on file  Occupational History   Occupation: retired  Tobacco Use   Smoking status: Never   Smokeless tobacco: Never  Vaping Use   Vaping status: Never Used  Substance and Sexual Activity   Alcohol use: No   Drug use: No   Sexual activity: Not Currently  Other Topics Concern   Not on file  Social History Narrative   Not on file   Social Determinants of Health   Financial Resource Strain: Low Risk  (08/13/2022)    Overall Financial Resource Strain (CARDIA)    Difficulty of Paying Living Expenses: Not hard at all  Food Insecurity: No Food Insecurity (08/13/2022)   Hunger Vital Sign    Worried About Running Out of Food in the Last Year: Never true    Ran Out of Food in the Last Year: Never true  Transportation Needs: No Transportation Needs (  08/13/2022)   PRAPARE - Administrator, Civil Service (Medical): No    Lack of Transportation (Non-Medical): No  Physical Activity: Inactive (08/13/2022)   Exercise Vital Sign    Days of Exercise per Week: 0 days    Minutes of Exercise per Session: 0 min  Stress: No Stress Concern Present (08/13/2022)   [employer] of Occupational Health - Occupational Stress Questionnaire    Feeling of Stress : Not at all  Social Connections: Moderately Isolated (08/10/2018)   Social Connection and Isolation Panel [NHANES]    Frequency of Communication with Friends and Family: More than three times a week    Frequency of Social Gatherings with Friends and Family: More than three times a week    Attends Religious Services: Never    Database administrator or Organizations: No    Attends Banker Meetings: Never    Marital Status: Divorced     Family History: The patient's family history includes Cancer in her sister; Heart attack in her mother; Heart disease in her mother and sister; Hypertension in her mother; Stroke in her sister.  ROS:   Please see the history of present illness.     All other systems reviewed and are negative.  EKGs/Labs/Other Studies Reviewed:    The following studies were reviewed today:   EKG:      Recent Labs: 12/24/2022: ALT 25 05/13/2023: BUN 28; Creatinine, Ser 1.40; Potassium 4.9; Sodium 138  Recent Lipid Panel    Component Value Date/Time   CHOL 124 05/13/2023 1200   TRIG 123 05/13/2023 1200   HDL 50 05/13/2023 1200   CHOLHDL 2.5 05/13/2023 1200   CHOLHDL 3.3 12/21/2015 0400   VLDL 15 12/21/2015 0400    LDLCALC 52 05/13/2023 1200    Physical Exam:     Physical Exam: Blood pressure 110/60, pulse 76, height 5\' 4"  (1.626 m), weight 299 lb 12.8 oz (136 kg), SpO2 98%.     GEN:  Well nourished, well developed in no acute distress HEENT: Normal NECK: No JVD; No carotid bruits LYMPHATICS: No lymphadenopathy CARDIAC: RRR , no murmurs, rubs, gallops RESPIRATORY:  Clear to auscultation without rales, wheezing or rhonchi  ABDOMEN: Soft, non-tender, non-distended MUSCULOSKELETAL:  No edema; No deformity  SKIN: Warm and dry NEUROLOGIC:  Alert and oriented x 3    ASSESSMENT:    1. Chronic heart failure with preserved ejection fraction (HCC)   2. Primary hypertension   3. Class 3 severe obesity due to excess calories with serious comorbidity in adult, unspecified BMI (HCC)     PLAN:       Chest pain :    . Atypical.  She is not having any episodes of chest pain 2.  Hyperlipidemia: Her last LDL from August, 2024 was 96 which looks great.  Continue current medications.  3.  Morbid obesity: Encouraged her to continue to work on her weight loss efforts.  Will have her follow-up with Dr. Rennis Golden in 1 year.  Medication Adjustments/Labs and Tests Ordered: Current medicines are reviewed at length with the patient today.  Concerns regarding medicines are outlined above.  No orders of the defined types were placed in this encounter.  No orders of the defined types were placed in this encounter.   Patient Instructions  Medication Instructions:  Your physician recommends that you continue on your current medications as directed. Please refer to the Current Medication list given to you today.  *If you need a refill on your  cardiac medications before your next appointment, please call your pharmacy*   Lab Work: none If you have labs (blood work) drawn today and your tests are completely normal, you will receive your results only by: MyChart Message (if you have MyChart) OR A paper  copy in the mail If you have any lab test that is abnormal or we need to change your treatment, we will call you to review the results.   Testing/Procedures: none   Follow-Up: At [facility], you and your health needs are our priority.  As part of our continuing mission to provide you with exceptional heart care, we have created designated Provider Care Teams.  These Care Teams include your primary Cardiologist (physician) and Advanced Practice Providers (APPs -  Physician Assistants and Nurse Practitioners) who all work together to provide you with the care you need, when you need it.  We recommend signing up for the patient portal called "MyChart".  Sign up information is provided on this After Visit Summary.  MyChart is used to connect with patients for Virtual Visits (Telemedicine).  Patients are able to view lab/test results, encounter notes, upcoming appointments, etc.  Non-urgent messages can be sent to your provider as well.   To learn more about what you can do with MyChart, go to [email or web address].    Your next appointment:   12 month(s)  Provider:   Dr Druscilla Brownie Card or EP not listed click to update   DO NOT delete brackets or number around this link :1}   Other Instructions      Signed, Kristeen Miss, MD  06/22/2023 3:58 PM    [facility]

## 2023-06-22 ENCOUNTER — Ambulatory Visit: Payer: 59 | Attending: Cardiovascular Disease | Admitting: Cardiovascular Disease

## 2023-06-22 ENCOUNTER — Encounter: Payer: Self-pay | Admitting: Cardiovascular Disease

## 2023-06-22 VITALS — BP 110/60 | HR 76 | Ht 64.0 in | Wt 299.8 lb

## 2023-06-22 DIAGNOSIS — E66813 Obesity, class 3: Secondary | ICD-10-CM | POA: Diagnosis not present

## 2023-06-22 DIAGNOSIS — I5032 Chronic diastolic (congestive) heart failure: Secondary | ICD-10-CM

## 2023-06-22 DIAGNOSIS — I10 Essential (primary) hypertension: Secondary | ICD-10-CM

## 2023-06-22 NOTE — Patient Instructions (Signed)
Medication Instructions:  Your physician recommends that you continue on your current medications as directed. Please refer to the Current Medication list given to you today.  *If you need a refill on your cardiac medications before your next appointment, please call your pharmacy*   Lab Work: none If you have labs (blood work) drawn today and your tests are completely normal, you will receive your results only by: MyChart Message (if you have MyChart) OR A paper copy in the mail If you have any lab test that is abnormal or we need to change your treatment, we will call you to review the results.   Testing/Procedures: none   Follow-Up: At [facility], you and your health needs are our priority.  As part of our continuing mission to provide you with exceptional heart care, we have created designated Provider Care Teams.  These Care Teams include your primary Cardiologist (physician) and Advanced Practice Providers (APPs -  Physician Assistants and Nurse Practitioners) who all work together to provide you with the care you need, when you need it.  We recommend signing up for the patient portal called "MyChart".  Sign up information is provided on this After Visit Summary.  MyChart is used to connect with patients for Virtual Visits (Telemedicine).  Patients are able to view lab/test results, encounter notes, upcoming appointments, etc.  Non-urgent messages can be sent to your provider as well.   To learn more about what you can do with MyChart, go to [email or web address].    Your next appointment:   12 month(s)  Provider:   Dr Druscilla Brownie Card or EP not listed click to update   DO NOT delete brackets or number around this link :1}   Other Instructions

## 2023-07-01 ENCOUNTER — Other Ambulatory Visit (HOSPITAL_BASED_OUTPATIENT_CLINIC_OR_DEPARTMENT_OTHER): Payer: Self-pay | Admitting: *Deleted

## 2023-07-01 ENCOUNTER — Other Ambulatory Visit: Payer: Self-pay

## 2023-07-01 MED ORDER — FUROSEMIDE 40 MG PO TABS: 40.0000 mg | ORAL_TABLET | Freq: Every day | ORAL | 3 refills | Status: DC

## 2023-07-01 MED ORDER — NEBIVOLOL HCL 2.5 MG PO TABS: 2.5000 mg | ORAL_TABLET | Freq: Every day | ORAL | 3 refills | Status: DC

## 2023-07-08 ENCOUNTER — Other Ambulatory Visit: Payer: Self-pay

## 2023-07-09 ENCOUNTER — Other Ambulatory Visit: Payer: Self-pay

## 2023-07-09 DIAGNOSIS — E1122 Type 2 diabetes mellitus with diabetic chronic kidney disease: Secondary | ICD-10-CM

## 2023-07-09 MED ORDER — SEMAGLUTIDE(0.25 OR 0.5MG/DOS) 2 MG/3ML ~~LOC~~ SOPN
3.0000 mL | PEN_INJECTOR | SUBCUTANEOUS | 2 refills | Status: DC
Start: 2023-07-09 — End: 2023-09-02

## 2023-07-16 ENCOUNTER — Other Ambulatory Visit: Payer: Self-pay | Admitting: Nurse Practitioner

## 2023-07-16 DIAGNOSIS — E782 Mixed hyperlipidemia: Secondary | ICD-10-CM

## 2023-08-17 ENCOUNTER — Other Ambulatory Visit: Payer: Self-pay | Admitting: Nurse Practitioner

## 2023-09-02 ENCOUNTER — Encounter: Payer: Self-pay | Admitting: Nurse Practitioner

## 2023-09-02 ENCOUNTER — Ambulatory Visit: Payer: 59 | Admitting: Nurse Practitioner

## 2023-09-02 ENCOUNTER — Ambulatory Visit: Payer: 59

## 2023-09-02 VITALS — BP 140/78 | HR 74 | Temp 97.9°F | Ht 62.2 in | Wt 298.2 lb

## 2023-09-02 VITALS — BP 140/74 | HR 74 | Temp 97.9°F | Ht 62.0 in | Wt 298.0 lb

## 2023-09-02 DIAGNOSIS — E1122 Type 2 diabetes mellitus with diabetic chronic kidney disease: Secondary | ICD-10-CM | POA: Diagnosis not present

## 2023-09-02 DIAGNOSIS — E782 Mixed hyperlipidemia: Secondary | ICD-10-CM

## 2023-09-02 DIAGNOSIS — I5089 Other heart failure: Secondary | ICD-10-CM | POA: Diagnosis not present

## 2023-09-02 DIAGNOSIS — Z0000 Encounter for general adult medical examination without abnormal findings: Secondary | ICD-10-CM | POA: Diagnosis not present

## 2023-09-02 DIAGNOSIS — Z6841 Body Mass Index (BMI) 40.0 and over, adult: Secondary | ICD-10-CM

## 2023-09-02 DIAGNOSIS — I110 Hypertensive heart disease with heart failure: Secondary | ICD-10-CM

## 2023-09-02 DIAGNOSIS — E66813 Obesity, class 3: Secondary | ICD-10-CM

## 2023-09-02 DIAGNOSIS — N1832 Chronic kidney disease, stage 3b: Secondary | ICD-10-CM

## 2023-09-02 MED ORDER — SEMAGLUTIDE(0.25 OR 0.5MG/DOS) 2 MG/3ML ~~LOC~~ SOPN: 3.0000 mL | PEN_INJECTOR | SUBCUTANEOUS | 2 refills | Status: DC

## 2023-09-02 NOTE — Assessment & Plan Note (Signed)
 Cholesterol levels are stable, continue current medications.

## 2023-09-02 NOTE — Assessment & Plan Note (Signed)
 She is encouraged to strive for BMI less than 30 to decrease cardiac risk. Advised to aim for at least 150 minutes of exercise per week.

## 2023-09-02 NOTE — Progress Notes (Signed)
Madelaine Bhat, CMA,acting as a Neurosurgeon for Sarah Felts, FNP.,have documented all relevant documentation on the behalf of Sarah Felts, FNP,as directed by  Sarah Felts, FNP while in the presence of Sarah Felts, FNP.  Subjective:  Patient ID: Sarah Weaver , female    DOB: 08-27-1936 , 87 y.o.   MRN: [id number]  Chief Complaint  Patient presents with   Hypertension    HPI  Patient presents today for a bp and dm follow up, Patient reports compliance with medication. Patient denies any chest pain, SOB, or headaches. Patient has no concerns today. She went to [facility] on [address] for her eye exam and is being referred to a specialist for her glaucoma.  She will be seeing a new cardiologist in May/June and due to see Nephrology in February. She will add water to her juice to cut down the sugar. She will drink a sugar free Dr Reino Kent. Reports she drinks about 96 oz water a day.   Diabetes She presents for her follow-up diabetic visit. She has type 2 diabetes mellitus. There are no hypoglycemic associated symptoms. There are no diabetic associated symptoms. There are no hypoglycemic complications. Diabetic complications include nephropathy. Pertinent negatives for diabetic complications include no heart disease. Risk factors for coronary artery disease include diabetes mellitus, obesity and sedentary lifestyle. Current diabetic treatment includes oral agent (dual therapy). She is following a generally healthy diet. When asked about meal planning, she reported none. (Admits to not checking her blood sugar regularly)  Hypertension This is a chronic problem. The current episode started more than 1 year ago. The problem is unchanged. The problem is controlled. Pertinent negatives include no anxiety. Risk factors for coronary artery disease include obesity and sedentary lifestyle. Past treatments include diuretics. Compliance problems include exercise.  Hypertensive end-organ damage  includes kidney disease. There is no history of angina. Identifiable causes of hypertension include chronic renal disease and a thyroid problem.     Past Medical History:  Diagnosis Date   Arthritis    "all over"   Chronic lower back pain    CKD Stage III 07/26/2018   Hx of hyperkalemia   Coronary artery disease    a. status post DES to the LAD 12/2015.   Glaucoma    HFpEF    Echocardiogram 12/2019: EF 60-65, no RWMA, mild LVH, Gr 1 DD, trivial MR, mild AoV sclerosis, no AS   Hyperkalemia    Hyperlipidemia    Hypertension    Mobitz (type) I (Wenckebach's) atrioventricular block 10/15/2021   Palpitations Zio Patch Monitor 5/21 NSR, avg HR 67, 1st degree AV block, intermittent Wenckebach during sleep, rare PACs/PVCs   Morbid obesity (HCC)    Palpitations    Type II diabetes mellitus (HCC)      Family History  Problem Relation Age of Onset   Heart disease Mother    Heart attack Mother    Hypertension Mother    Heart disease Sister    Cancer Sister    Stroke Sister      Current Outpatient Medications:    Accu-Chek Softclix Lancets lancets, USE UP TO 4 TIMES DAILY AS DIRECTED, Disp: 100 each, Rfl: 3   aspirin EC 81 MG tablet, Take 81 mg by mouth daily., Disp: , Rfl:    atorvastatin (LIPITOR) 80 MG tablet, TAKE 1 TABLET BY MOUTH EVERYDAY AT BEDTIME, Disp: 90 tablet, Rfl: 2   BD PEN NEEDLE NANO 2ND GEN 32G X 4 MM MISC, USE NIGHTLY AS DIRECTED, Disp: 100  each, Rfl: 3   blood glucose meter kit and supplies KIT, Dispense based on patient and insurance preference. Use up to four times daily as directed. (FOR ICD-9 250.00, 250.01)., Disp: 1 each, Rfl: 0   Cholecalciferol 125 MCG (5000 UT) TABS, Take 1 tablet by mouth daily., Disp: , Rfl:    empagliflozin (JARDIANCE) 10 MG TABS tablet, Take 1 tablet (10 mg total) by mouth daily before breakfast., Disp: 90 tablet, Rfl: 3   ezetimibe (ZETIA) 10 MG tablet, Take 1 tablet (10 mg total) by mouth daily., Disp: 90 tablet, Rfl: 3   furosemide  (LASIX) 40 MG tablet, Take 1 tablet (40 mg total) by mouth daily., Disp: 90 tablet, Rfl: 3   insulin degludec (TRESIBA FLEXTOUCH) 100 UNIT/ML FlexTouch Pen, INJECT 10 UNITS INTO THE SKIN DAILY, Disp: 15 mL, Rfl: 3   Latanoprostene Bunod 0.024 % SOLN, Apply 1 drop to eye daily., Disp: , Rfl:    levothyroxine (SYNTHROID) 50 MCG tablet, TAKE 1 TABLET BY MOUTH EVERY DAY BEFORE BREAKFAST, Disp: 90 tablet, Rfl: 1   LINZESS 145 MCG CAPS capsule, TAKE 1 CAPSULE (145 MCG TOTAL) BY MOUTH DAILY BEFORE BREAKFAST. FOR CONSTIPATION, Disp: 90 capsule, Rfl: 0   losartan (COZAAR) 50 MG tablet, Take 50 mg by mouth at bedtime., Disp: , Rfl:    Magnesium 250 MG TABS, Take 1 tablet (250 mg total) by mouth every evening. Take with evening meal, Disp: 90 tablet, Rfl: 1   nebivolol (BYSTOLIC) 2.5 MG tablet, Take 1 tablet (2.5 mg total) by mouth daily., Disp: 90 tablet, Rfl: 3   nystatin powder, Apply 1 Application topically 3 (three) times daily., Disp: 60 g, Rfl: 1   ONETOUCH VERIO test strip, CHECK BLOOD SUGARS ONCE DAILY, Disp: 50 strip, Rfl: 11   timolol (BETIMOL) 0.5 % ophthalmic solution, 1 drop 2 (two) times daily., Disp: , Rfl:    traMADol-acetaminophen (ULTRACET) 37.5-325 MG tablet, TAKE 1 TABLET BY MOUTH EVERY 6 HOURS AS NEEDED FOR MODERATE BACK PAIN, Disp: 20 tablet, Rfl: 0   vitamin C (ASCORBIC ACID) 250 MG tablet, Take 250 mg by mouth daily., Disp: , Rfl:    Zinc Sulfate (ZINC-220 PO), Take 1 tablet by mouth daily., Disp: , Rfl:    Semaglutide,0.25 or 0.5MG /DOS, 2 MG/3ML SOPN, Inject 3 mLs into the skin once a week., Disp: 3 mL, Rfl: 2   Allergies  Allergen Reactions   Carvedilol Cough    Pt reports causes her to cough   Lisinopril Cough    REPORTS CAUSES DRY COUGH     Review of Systems  Constitutional: Negative.   HENT: Negative.    Eyes: Negative.   Respiratory: Negative.    Cardiovascular: Negative.   Gastrointestinal: Negative.   Endocrine: Negative.   Genitourinary: Negative.    Musculoskeletal:  Negative for back pain.  Skin: Negative.   Allergic/Immunologic: Negative.   Neurological: Negative.   Hematological: Negative.   Psychiatric/Behavioral: Negative.       Today's Vitals   09/02/23 0855 09/02/23 0901  BP: (!) 160/94 (!) 140/74  Pulse: 74   Temp: 97.9 F (36.6 C)   TempSrc: Oral   Weight: 298 lb (135.2 kg)   Height: 5\' 2"  (1.575 m)   PainSc: 0-No pain    Body mass index is 54.5 kg/m.  Wt Readings from Last 3 Encounters:  09/02/23 298 lb (135.2 kg)  09/02/23 298 lb 3.2 oz (135.3 kg)  06/22/23 299 lb 12.8 oz (136 kg)      Objective:  Physical Exam Vitals reviewed.  Constitutional:      General: She is not in acute distress.    Appearance: Normal appearance. She is well-developed. She is obese.  HENT:     Head: Normocephalic and atraumatic.  Eyes:     Pupils: Pupils are equal, round, and reactive to light.  Cardiovascular:     Rate and Rhythm: Normal rate and regular rhythm.     Pulses: Normal pulses.     Heart sounds: Normal heart sounds. No murmur heard. Pulmonary:     Effort: Pulmonary effort is normal. No respiratory distress.     Breath sounds: Normal breath sounds. No wheezing.  Musculoskeletal:        General: Normal range of motion.  Skin:    General: Skin is warm and dry.     Capillary Refill: Capillary refill takes less than 2 seconds.  Neurological:     General: No focal deficit present.     Mental Status: She is alert and oriented to person, place, and time.     Cranial Nerves: No cranial nerve deficit.     Motor: No weakness.  Psychiatric:        Mood and Affect: Mood normal.        Thought Content: Thought content normal.        Judgment: Judgment normal.         Assessment And Plan:  Type 2 diabetes mellitus with stage 3b chronic kidney disease, without long-term current use of insulin (HCC) Assessment & Plan: HgbA1c was elevated at last visit, she ran out of the Ozempic, reports [facility] said we  needed to call them however did not receive a PA for this, sent a message to [facility] Pharmacist to f/u.  will restart. Sample given in office.   Orders: -     Hemoglobin A1c -     Semaglutide(0.25 or 0.5MG /DOS); Inject 3 mLs into the skin once a week.  Dispense: 3 mL; Refill: 2 -     BMP8+eGFR  Mixed hyperlipidemia Assessment & Plan: Cholesterol levels are stable, continue current medications.   Orders: -     Lipid panel  Hypertensive heart disease with other congestive heart failure (HCC) Assessment & Plan: Blood pressure is slightly elevated, improved with repeat. Continue current medications   Class 3 severe obesity due to excess calories with serious comorbidity and body mass index (BMI) of 50.0 to 59.9 in adult [facility]) Assessment & Plan: She is encouraged to strive for BMI less than 30 to decrease cardiac risk. Advised to aim for at least 150 minutes of exercise per week.     Return for 6 month bp check.  Patient was given opportunity to ask questions. Patient verbalized understanding of the plan and was able to repeat key elements of the plan. All questions were answered to their satisfaction.     Jeanell Sparrow, FNP, have reviewed all documentation for this visit. The documentation on 09/02/23 for the exam, diagnosis, procedures, and orders are all accurate and complete.  IF YOU HAVE BEEN REFERRED TO A SPECIALIST, IT MAY TAKE 1-2 WEEKS TO SCHEDULE/PROCESS THE REFERRAL. IF YOU HAVE NOT HEARD FROM US/SPECIALIST IN TWO WEEKS, PLEASE GIVE [place] A CALL AT [phone].

## 2023-09-02 NOTE — Patient Instructions (Signed)
Sarah Weaver , Thank you for taking time to come for your Medicare Wellness Visit. I appreciate your ongoing commitment to your health goals. Please review the following plan we discussed and let me know if I can assist you in the future.   Referrals/Orders/Follow-Ups/Clinician Recommendations: none  This is a list of the screening recommended for you and due dates:  Health Maintenance  Topic Date Due   Complete foot exam   08/20/2022   Eye exam for diabetics  10/31/2022   COVID-19 Vaccine (3 - 2024-25 season) 09/18/2023*   Zoster (Shingles) Vaccine (1 of 2) 12/01/2023*   Hemoglobin A1C  11/13/2023   Yearly kidney health urinalysis for diabetes  12/24/2023   Yearly kidney function blood test for diabetes  05/12/2024   Medicare Annual Wellness Visit  09/01/2024   DTaP/Tdap/Td vaccine (2 - Td or Tdap) 09/22/2028   Pneumonia Vaccine  Completed   Flu Shot  Completed   DEXA scan (bone density measurement)  Completed   HPV Vaccine  Aged Out  *Topic was postponed. The date shown is not the original due date.    Advanced directives: (Declined) Advance directive discussed with you today. Even though you declined this today, please call our office should you change your mind, and we can give you the proper paperwork for you to fill out.  Next Medicare Annual Wellness Visit scheduled for next year: No, office will schedule  Insert Preventive Care attachment Insert FALL PREVENTION attachment if needed

## 2023-09-02 NOTE — Progress Notes (Signed)
Subjective:   Sarah Weaver is a 87 y.o. female who presents for Medicare Annual (Subsequent) preventive examination.  Visit Complete: In person    Cardiac Risk Factors include: advanced age (>76men, >65 women);diabetes mellitus;dyslipidemia;hypertension;obesity (BMI >30kg/m2)     Objective:    Today's Vitals   09/02/23 0834 09/02/23 0856  BP: (!) 160/94 (!) 140/78  Pulse: 74   Temp: 97.9 F (36.6 C)   TempSrc: Oral   SpO2: 95%   Weight: 298 lb 3.2 oz (135.3 kg)   Height: 5' 2.2" (1.58 m)    Body mass index is 54.19 kg/m.     09/02/2023    8:46 AM 08/13/2022    8:19 AM 04/11/2022    1:49 AM 12/26/2021    6:52 PM 07/25/2021   11:44 AM 07/11/2020   10:11 AM 05/04/2020   12:56 PM  Advanced Directives  Does Patient Have a Medical Advance Directive? No No No No No No No  Would patient like information on creating a medical advance directive? No - Patient declined  No - Patient declined  No - Patient declined  No - Patient declined    Current Medications (verified) Outpatient Encounter Medications as of 09/02/2023  Medication Sig   Accu-Chek Softclix Lancets lancets USE UP TO 4 TIMES DAILY AS DIRECTED   aspirin EC 81 MG tablet Take 81 mg by mouth daily.   atorvastatin (LIPITOR) 80 MG tablet TAKE 1 TABLET BY MOUTH EVERYDAY AT BEDTIME   BD PEN NEEDLE NANO 2ND GEN 32G X 4 MM MISC USE NIGHTLY AS DIRECTED   blood glucose meter kit and supplies KIT Dispense based on patient and insurance preference. Use up to four times daily as directed. (FOR ICD-9 250.00, 250.01).   Cholecalciferol 125 MCG (5000 UT) TABS Take 1 tablet by mouth daily.   empagliflozin (JARDIANCE) 10 MG TABS tablet Take 1 tablet (10 mg total) by mouth daily before breakfast.   ezetimibe (ZETIA) 10 MG tablet Take 1 tablet (10 mg total) by mouth daily.   furosemide (LASIX) 40 MG tablet Take 1 tablet (40 mg total) by mouth daily.   insulin degludec (TRESIBA FLEXTOUCH) 100 UNIT/ML FlexTouch Pen  INJECT 10 UNITS INTO THE SKIN DAILY   Latanoprostene Bunod 0.024 % SOLN Apply 1 drop to eye daily.   levothyroxine (SYNTHROID) 50 MCG tablet TAKE 1 TABLET BY MOUTH EVERY DAY BEFORE BREAKFAST   LINZESS 145 MCG CAPS capsule TAKE 1 CAPSULE (145 MCG TOTAL) BY MOUTH DAILY BEFORE BREAKFAST. FOR CONSTIPATION   losartan (COZAAR) 50 MG tablet Take 50 mg by mouth at bedtime.   Magnesium 250 MG TABS Take 1 tablet (250 mg total) by mouth every evening. Take with evening meal   nebivolol (BYSTOLIC) 2.5 MG tablet Take 1 tablet (2.5 mg total) by mouth daily.   nystatin powder Apply 1 Application topically 3 (three) times daily.   ONETOUCH VERIO test strip CHECK BLOOD SUGARS ONCE DAILY   timolol (BETIMOL) 0.5 % ophthalmic solution 1 drop 2 (two) times daily.   traMADol-acetaminophen (ULTRACET) 37.5-325 MG tablet TAKE 1 TABLET BY MOUTH EVERY 6 HOURS AS NEEDED FOR MODERATE BACK PAIN   vitamin C (ASCORBIC ACID) 250 MG tablet Take 250 mg by mouth daily.   Zinc Sulfate (ZINC-220 PO) Take 1 tablet by mouth daily.   Semaglutide,0.25 or 0.5MG /DOS, 2 MG/3ML SOPN Inject 3 mLs into the skin once a week. (Patient not taking: Reported on 09/02/2023)   No facility-administered encounter medications on file as of 09/02/2023.  Allergies (verified) Carvedilol and Lisinopril   History: Past Medical History:  Diagnosis Date   Arthritis    "all over"   Chronic lower back pain    CKD Stage III 07/26/2018   Hx of hyperkalemia   Coronary artery disease    a. status post DES to the LAD 12/2015.   Glaucoma    HFpEF    Echocardiogram 12/2019: EF 60-65, no RWMA, mild LVH, Gr 1 DD, trivial MR, mild AoV sclerosis, no AS   Hyperkalemia    Hyperlipidemia    Hypertension    Mobitz (type) I (Wenckebach's) atrioventricular block 10/15/2021   Palpitations Zio Patch Monitor 5/21 NSR, avg HR 67, 1st degree AV block, intermittent Wenckebach during sleep, rare PACs/PVCs   Morbid obesity (HCC)    Palpitations    Type II diabetes  mellitus (HCC)    Past Surgical History:  Procedure Laterality Date   CARDIAC CATHETERIZATION N/A 12/20/2015   Procedure: Left Heart Cath and Coronary Angiography;  Surgeon: Runell Gess, MD;  Location: [facility];  Service: Cardiovascular;  Laterality: N/A;   CARDIAC CATHETERIZATION N/A 12/20/2015   Procedure: Coronary Stent Intervention;  Surgeon: Runell Gess, MD;  Location: [facility];  Service: Cardiovascular;  Laterality: N/A;  mid LAD 2.5 x 20 Synergy   CATARACT EXTRACTION W/ INTRAOCULAR LENS  IMPLANT, BILATERAL Bilateral    CORONARY ANGIOPLASTY WITH STENT PLACEMENT  12/20/2015   LAPAROSCOPIC CHOLECYSTECTOMY  04/23/11   TONSILLECTOMY     Family History  Problem Relation Age of Onset   Heart disease Mother    Heart attack Mother    Hypertension Mother    Heart disease Sister    Cancer Sister    Stroke Sister    Social History   Socioeconomic History   Marital status: Divorced    Spouse name: Not on file   Number of children: Not on file   Years of education: Not on file   Highest education level: Not on file  Occupational History   Occupation: retired  Tobacco Use   Smoking status: Never   Smokeless tobacco: Never  Vaping Use   Vaping status: Never Used  Substance and Sexual Activity   Alcohol use: No   Drug use: No   Sexual activity: Not Currently  Other Topics Concern   Not on file  Social History Narrative   Not on file   Social Drivers of Health   Financial Resource Strain: Low Risk  (09/02/2023)   Overall Financial Resource Strain (CARDIA)    Difficulty of Paying Living Expenses: Not hard at all  Food Insecurity: No Food Insecurity (09/02/2023)   Hunger Vital Sign    Worried About Running Out of Food in the Last Year: Never true    Ran Out of Food in the Last Year: Never true  Transportation Needs: No Transportation Needs (09/02/2023)   PRAPARE - Administrator, Civil Service (Medical): No    Lack of Transportation  (Non-Medical): No  Physical Activity: Insufficiently Active (09/02/2023)   Exercise Vital Sign    Days of Exercise per Week: 1 day    Minutes of Exercise per Session: 60 min  Stress: No Stress Concern Present (09/02/2023)   [employer] of Occupational Health - Occupational Stress Questionnaire    Feeling of Stress : Not at all  Social Connections: Moderately Isolated (09/02/2023)   Social Connection and Isolation Panel [NHANES]    Frequency of Communication with Friends and Family: More than  three times a week    Frequency of Social Gatherings with Friends and Family: More than three times a week    Attends Religious Services: 1 to 4 times per year    Active Member of [employer] or Organizations: No    Attends Engineer, structural: Never    Marital Status: Divorced    Tobacco Counseling Counseling given: Not Answered   Clinical Intake:  Pre-visit preparation completed: Yes  Pain : No/denies pain     Nutritional Status: BMI > 30  Obese Nutritional Risks: None Diabetes: Yes CBG done?: No Did pt. bring in CBG monitor from home?: No  How often do you need to have someone help you when you read instructions, pamphlets, or other written materials from your doctor or pharmacy?: 1 - Never  Interpreter Needed?: No  Information entered by :: NAllen LPN   Activities of Daily Living    09/02/2023    8:36 AM  In your present state of health, do you have any difficulty performing the following activities:  Hearing? 0  Vision? 0  Difficulty concentrating or making decisions? 0  Walking or climbing stairs? 1  Dressing or bathing? 0  Doing errands, shopping? 0  Preparing Food and eating ? N  Using the Toilet? N  In the past six months, have you accidently leaked urine? Y  Comment wears a pad  Do you have problems with loss of bowel control? N  Managing your Medications? N  Managing your Finances? N  Housekeeping or managing your Housekeeping? N    Patient Care  Team: [name], FNP as PCP - General (General Practice) Nahser, Deloris Ping, MD as PCP - Cardiology (Cardiology) Harlan Stains, RPH (Inactive) (Pharmacist) Kennon Rounds as Physician Assistant (Cardiology)  Indicate any recent Medical Services you may have received from other than [facility] providers in the past year (date may be approximate).     Assessment:   This is a routine wellness examination for Sarah Weaver.  Hearing/Vision screen Hearing Screening - Comments:: Denies hearing issues Vision Screening - Comments:: Regular eye exams, [facility]   Goals Addressed             This Visit's Progress    Patient Stated       09/02/2023, denies goals       Depression Screen    09/02/2023    8:49 AM 12/23/2022   11:36 AM 08/13/2022    8:19 AM 07/25/2021   11:45 AM 07/11/2020   10:12 AM 08/25/2019    9:47 AM 05/17/2019    9:03 AM  PHQ 2/9 Scores  PHQ - 2 Score 0 0 0 0 0 0 0  PHQ- 9 Score 0    0      Fall Risk    09/02/2023    8:48 AM 05/13/2023   10:45 AM 12/23/2022   11:36 AM 08/13/2022    8:19 AM 07/25/2021   11:45 AM  Fall Risk   Falls in the past year? 0 0 0 0 0  Number falls in past yr: 0 0  0   Injury with Fall? 0 0  0   Risk for fall due to : Medication side effect;Impaired mobility;Impaired balance/gait No Fall Risks  Medication side effect Impaired balance/gait;Impaired mobility;Medication side effect  Follow up Falls prevention discussed;Falls evaluation completed Falls evaluation completed  Falls prevention discussed;Education provided;Falls evaluation completed Falls evaluation completed;Education provided;Falls prevention discussed    MEDICARE RISK AT HOME: Medicare Risk at Home Any  stairs in or around the home?: No If so, are there any without handrails?: No Home free of loose throw rugs in walkways, pet beds, electrical cords, etc?: Yes Adequate lighting in your home to reduce risk of falls?: Yes Life alert?: No Use of a cane, walker or w/c?:  Yes Grab bars in the bathroom?: No Shower chair or bench in shower?: No Elevated toilet seat or a handicapped toilet?: No  TIMED UP AND GO:  Was the test performed?  Yes  Length of time to ambulate 10 feet: 7 sec Gait slow and steady with assistive device    Cognitive Function:        09/02/2023    8:49 AM 08/13/2022    8:21 AM 07/25/2021   11:47 AM 07/11/2020   10:13 AM 05/05/2019    9:15 AM  6CIT Screen  What Year? 0 points 0 points 0 points 0 points 0 points  What month? 0 points 0 points 0 points 0 points 0 points  What time? 3 points 0 points 0 points 0 points 0 points  Count back from 20 0 points 0 points 0 points 0 points 0 points  Months in reverse 0 points 0 points 0 points 0 points 0 points  Repeat phrase 4 points 6 points 4 points 2 points 0 points  Total Score 7 points 6 points 4 points 2 points 0 points    Immunizations Immunization History  Administered Date(s) Administered   Fluad Quad(high Dose 65+) 07/11/2020, 06/26/2021   Fluad Trivalent(High Dose 65+) 05/13/2023   Influenza, High Dose Seasonal PF 07/29/2017, 07/08/2018, 06/23/2019   Influenza-Unspecified 06/15/2018   PFIZER(Purple Top)SARS-COV-2 Vaccination 09/26/2020, 10/17/2020   PNEUMOCOCCAL CONJUGATE-20 12/23/2022   Pneumococcal Polysaccharide-23 04/24/2011, 07/29/2017   Tdap 09/22/2018    TDAP status: Up to date  Flu Vaccine status: Up to date  Pneumococcal vaccine status: Up to date  Covid-19 vaccine status: Information provided on how to obtain vaccines.   Qualifies for Shingles Vaccine? Yes   Zostavax completed No   Shingrix Completed?: No.    Education has been provided regarding the importance of this vaccine. Patient has been advised to call insurance company to determine out of pocket expense if they have not yet received this vaccine. Advised may also receive vaccine at local pharmacy or Health Dept. Verbalized acceptance and understanding.  Screening Tests Health Maintenance   Topic Date Due   FOOT EXAM  08/20/2022   OPHTHALMOLOGY EXAM  10/31/2022   COVID-19 Vaccine (3 - 2024-25 season) 09/18/2023 (Originally 05/17/2023)   Zoster Vaccines- Shingrix (1 of 2) 12/01/2023 (Originally 10/09/1987)   HEMOGLOBIN A1C  11/13/2023   Diabetic kidney evaluation - Urine ACR  12/24/2023   Diabetic kidney evaluation - eGFR measurement  05/12/2024   Medicare Annual Wellness (AWV)  09/01/2024   DTaP/Tdap/Td (2 - Td or Tdap) 09/22/2028   Pneumonia Vaccine 11+ Years old  Completed   INFLUENZA VACCINE  Completed   DEXA SCAN  Completed   HPV VACCINES  Aged Out    Health Maintenance  Health Maintenance Due  Topic Date Due   FOOT EXAM  08/20/2022   OPHTHALMOLOGY EXAM  10/31/2022    Colorectal cancer screening: No longer required.   Mammogram status: No longer required due to age.  Bone Density status: Completed 10/11/2019.  Lung Cancer Screening: (Low Dose CT Chest recommended if Age 36-80 years, 20 pack-year currently smoking OR have quit w/in 15years.) does not qualify.   Lung Cancer Screening Referral: no  Additional Screening:  Hepatitis C Screening: does not qualify;   Vision Screening: Recommended annual ophthalmology exams for early detection of glaucoma and other disorders of the eye. Is the patient up to date with their annual eye exam?  Yes  Who is the provider or what is the name of the office in which the patient attends annual eye exams? [facility] If pt is not established with a provider, would they like to be referred to a provider to establish care? No .   Dental Screening: Recommended annual dental exams for proper oral hygiene  Diabetic Foot Exam: Diabetic Foot Exam: Overdue, Pt has been advised about the importance in completing this exam. Pt is scheduled for diabetic foot exam on next appointment.  Community Resource Referral / Chronic Care Management: CRR required this visit?  No   CCM required this visit?  No     Plan:     I have  personally reviewed and noted the following in the patient's chart:   Medical and social history Use of alcohol, tobacco or illicit drugs  Current medications and supplements including opioid prescriptions. Patient is not currently taking opioid prescriptions. Functional ability and status Nutritional status Physical activity Advanced directives List of other physicians Hospitalizations, surgeries, and ER visits in previous 12 months Vitals Screenings to include cognitive, depression, and falls Referrals and appointments  In addition, I have reviewed and discussed with patient certain preventive protocols, quality metrics, and best practice recommendations. A written personalized care plan for preventive services as well as general preventive health recommendations were provided to patient.     Barb Merino, LPN   [id number]   After Visit Summary: (In Person-Printed) AVS printed and given to the patient  Nurse Notes: none

## 2023-09-02 NOTE — Assessment & Plan Note (Signed)
HgbA1c was elevated at last visit, she ran out of the Ozempic, reports [facility] said we needed to call them however did not receive a PA for this, sent a message to Catie Pharmacist to f/u.  will restart. Sample given in office.

## 2023-09-03 LAB — LIPID PANEL
Chol/HDL Ratio: 2.5 {ratio} (ref 0.0–4.4)
Cholesterol, Total: 130 mg/dL (ref 100–199)
HDL: 53 mg/dL (ref >39)
LDL Chol Calc (NIH): 62 mg/dL (ref 0–99)
Triglycerides: 78 mg/dL (ref 0–149)
VLDL Cholesterol Cal: 15 mg/dL (ref 5–40)

## 2023-09-03 LAB — BMP8+EGFR
BUN/Creatinine Ratio: 19 (ref 12–28)
BUN: 29 mg/dL — ABNORMAL HIGH (ref 8–27)
CO2: 22 mmol/L (ref 20–29)
Calcium: 9.7 mg/dL (ref 8.7–10.3)
Chloride: 102 mmol/L (ref 96–106)
Creatinine, Ser: 1.51 mg/dL — ABNORMAL HIGH (ref 0.57–1.00)
Glucose: 196 mg/dL — ABNORMAL HIGH (ref 70–99)
Potassium: 4.6 mmol/L (ref 3.5–5.2)
Sodium: 141 mmol/L (ref 134–144)
eGFR: 34 mL/min/{1.73_m2} — ABNORMAL LOW (ref >59)

## 2023-09-03 LAB — HEMOGLOBIN A1C
Est. average glucose Bld gHb Est-mCnc: 209 mg/dL
Hgb A1c MFr Bld: 8.9 % — ABNORMAL HIGH (ref 4.8–5.6)

## 2023-09-14 ENCOUNTER — Ambulatory Visit: Payer: 59 | Admitting: Nurse Practitioner

## 2023-09-15 ENCOUNTER — Other Ambulatory Visit: Payer: Self-pay | Admitting: Nurse Practitioner

## 2023-09-15 DIAGNOSIS — E1122 Type 2 diabetes mellitus with diabetic chronic kidney disease: Secondary | ICD-10-CM

## 2023-09-15 MED ORDER — SEMAGLUTIDE(0.25 OR 0.5MG/DOS) 2 MG/3ML ~~LOC~~ SOPN: 0.5000 mL | PEN_INJECTOR | SUBCUTANEOUS | 2 refills | Status: DC

## 2023-09-15 NOTE — Assessment & Plan Note (Signed)
Blood pressure is slightly elevated, improved with repeat. Continue current medications

## 2023-09-25 ENCOUNTER — Encounter: Payer: Self-pay | Admitting: Pharmacist

## 2023-10-02 ENCOUNTER — Other Ambulatory Visit: Payer: Self-pay

## 2023-10-02 MED ORDER — EZETIMIBE 10 MG PO TABS: 10.0000 mg | ORAL_TABLET | Freq: Every day | ORAL | 2 refills | Status: DC

## 2023-10-20 ENCOUNTER — Other Ambulatory Visit: Payer: Self-pay | Admitting: Nurse Practitioner

## 2023-10-20 DIAGNOSIS — N1832 Chronic kidney disease, stage 3b: Secondary | ICD-10-CM

## 2023-10-28 ENCOUNTER — Other Ambulatory Visit: Payer: Self-pay

## 2023-10-28 ENCOUNTER — Other Ambulatory Visit: Payer: Self-pay | Admitting: Nurse Practitioner

## 2023-10-28 DIAGNOSIS — N1832 Chronic kidney disease, stage 3b: Secondary | ICD-10-CM

## 2023-10-28 MED ORDER — SEMAGLUTIDE(0.25 OR 0.5MG/DOS) 2 MG/3ML ~~LOC~~ SOPN: 0.5000 mL | PEN_INJECTOR | SUBCUTANEOUS | 2 refills | Status: DC

## 2023-10-29 ENCOUNTER — Telehealth: Payer: Self-pay | Admitting: Pharmacist

## 2023-10-29 NOTE — Progress Notes (Signed)
   10/29/2023  Patient ID: Sarah Weaver, female   DOB: 03-14-1936, 88 y.o.   MRN: [id number]  Reason for referral: Medication Management   Referral source: Dr. Wells Guiles, FNP   Reason for call: Called patient because she is on the [employer] Diabetes Report.  It looks like Catie had already attempted to outreach the patient.    Outreach:  Unsuccessful telephone call attempt #1 to patient.   Unable to leave message Phone rang without the ability to leave a message  Plan:  -I will make another outreach attempt to patient within 3-4 business days.   Beecher Mcardle, PharmD, BCACP Clinical Pharmacist [phone]

## 2023-11-05 ENCOUNTER — Other Ambulatory Visit: Payer: Self-pay | Admitting: Pharmacist

## 2023-11-05 ENCOUNTER — Other Ambulatory Visit: Payer: Self-pay

## 2023-11-05 DIAGNOSIS — N1832 Chronic kidney disease, stage 3b: Secondary | ICD-10-CM

## 2023-11-05 MED ORDER — OZEMPIC (0.25 OR 0.5 MG/DOSE) 2 MG/3ML ~~LOC~~ SOPN: 0.2500 mg | PEN_INJECTOR | SUBCUTANEOUS | 2 refills | Status: DC

## 2023-11-05 NOTE — Progress Notes (Unsigned)
11/05/2023 Name: Sarah Weaver MRN: [id number] DOB: 05-29-1936  Chief Complaint  Patient presents with   Medication Management    Diabetes     Sarah Weaver is a 88 y.o. year old female who presented for a telephone visit.   They were referred to the pharmacist by a quality report for assistance in managing diabetes.  On [employer] Report.  Subjective:  Care Team: Primary Care Provider: [name], FNP ; Next Scheduled Visit: 03/09/2024  Medication Access/Adherence  Current Pharmacy:  [id number] - [place], [place] - [address] AT [facility] [address] [place] [address] Phone: [phone] Fax: [phone]   Patient reports affordability concerns with their medications: No  Patient reports access/transportation concerns to their pharmacy: No  Patient reports adherence concerns with their medications:  No      Diabetes:  Current medications:  Ozempic 0.25 mg weekly (had issue with this not filled at Pharmacy) Evaristo Bury 15 units daily Jardiance 10 mg 1 tablet daily  Current glucose readings:Patient does not check blood sugars. She has a meter but prefers not to test as she does not like to prick her fingers.  Will discuss CGM at our next call.   Patient denies hypoglycemic or hyperglycemic symptoms.   Objective:  Lab Results  Component Value Date   HGBA1C 8.9 (H) 09/02/2023    Lab Results  Component Value Date   CREATININE 1.51 (H) 09/02/2023   BUN 29 (H) 09/02/2023   NA 141 09/02/2023   K 4.6 09/02/2023   CL 102 09/02/2023   CO2 22 09/02/2023    Lab Results  Component Value Date   CHOL 130 09/02/2023   HDL 53 09/02/2023   LDLCALC 62 09/02/2023   TRIG 78 09/02/2023   CHOLHDL 2.5 09/02/2023    Medications Reviewed Today     Reviewed by Beecher Mcardle, RPH (Pharmacist) on 11/05/23 at 1048  Med List Status: <None>   Medication Order Taking? Sig Documenting  Provider Last Dose Status Informant  Accu-Chek Softclix Lancets lancets [id number] Yes USE UP TO 4 TIMES DAILY AS DIRECTED [name], FNP Taking Active   aspirin EC 81 MG tablet [id number] Yes Take 81 mg by mouth daily. [provider] Taking Active Self  atorvastatin (LIPITOR) 80 MG tablet [id number] Yes TAKE 1 TABLET BY MOUTH EVERYDAY AT BEDTIME [name], FNP Taking Active   BD PEN NEEDLE NANO 2ND GEN 32G X 4 MM MISC [id number] Yes USE NIGHTLY AS DIRECTED [name], FNP Taking Active   blood glucose meter kit and supplies KIT [id number] Yes Dispense based on patient and insurance preference. Use up to four times daily as directed. (FOR ICD-9 250.00, 250.01). [name], FNP Taking Active   Cholecalciferol 125 MCG (5000 UT) TABS [id number] Yes Take 1 tablet by mouth daily. [provider] Taking Active   empagliflozin (JARDIANCE) 10 MG TABS tablet [id number] Yes Take 1 tablet (10 mg total) by mouth daily before breakfast. Meriam Sprague, MD Taking Active   ezetimibe (ZETIA) 10 MG tablet [id number] Yes Take 1 tablet (10 mg total) by mouth daily. Nahser, Deloris Ping, MD Taking Active   furosemide (LASIX) 40 MG tablet [id number] Yes Take 1 tablet (40 mg total) by mouth daily. Nahser, Deloris Ping, MD Taking Active   insulin degludec (TRESIBA FLEXTOUCH) 100 UNIT/ML FlexTouch Pen [id number] Yes INJECT 10 UNITS INTO THE SKIN DAILY  Patient taking differently: Inject 15 Units into the skin daily.  [name], FNP Taking Active   Latanoprostene Bunod 0.024 % SOLN [id number] Yes Apply 1 drop to eye daily. [provider] Taking Active Self  levothyroxine (SYNTHROID) 50 MCG tablet [id number] Yes TAKE 1 TABLET BY MOUTH EVERY DAY BEFORE Golden Hurter, FNP Taking Active   LINZESS 145 MCG CAPS capsule [id number] Yes TAKE 1 CAPSULE (145 MCG TOTAL) BY MOUTH DAILY BEFORE BREAKFAST. FOR CONSTIPATION [name], FNP Taking Active   losartan (COZAAR) 50 MG tablet [id number]  Yes Take 50 mg by mouth at bedtime. Arita Miss, MD Taking Active   Magnesium 250 MG TABS [id number] Yes Take 1 tablet (250 mg total) by mouth every evening. Take with evening meal [name], FNP Taking Active   nebivolol (BYSTOLIC) 2.5 MG tablet [id number] Yes Take 1 tablet (2.5 mg total) by mouth daily. Nahser, Deloris Ping, MD Taking Active   nystatin powder [id number] Yes Apply 1 Application topically 3 (three) times daily. [name], FNP Taking Active   [facility] VERIO test strip [id number] No CHECK BLOOD SUGARS ONCE DAILY  Patient not taking: Reported on 11/05/2023   Rodriguez-SouthworthNettie Elm, PA-C Not Taking Active   Semaglutide,0.25 or 0.5MG /DOS, (OZEMPIC, 0.25 OR 0.5 MG/DOSE,) 2 MG/3ML SOPN [id number] Yes Inject 0.5 mg into the skin once a week.  Patient taking differently: Inject 0.25 mg into the skin once a week.   [name], FNP Taking Active   timolol (TIMOPTIC) 0.5 % ophthalmic solution [id number] Yes 1 drop 3 (three) times daily. [provider] Taking Active   traMADol-acetaminophen (ULTRACET) 37.5-325 MG tablet [id number] Yes TAKE 1 TABLET BY MOUTH EVERY 6 HOURS AS NEEDED FOR MODERATE BACK PAIN [name], FNP Taking Active   vitamin C (ASCORBIC ACID) 250 MG tablet [id number] Yes Take 250 mg by mouth daily. [provider] Taking Active Self  Zinc Sulfate (ZINC-220 PO) [id number] Yes Take 1 tablet by mouth daily. [provider] Taking Active Self              Assessment/Plan:   Diabetes: - Currently uncontrolled Aic 8.9% for age, goal may be <8 - Recommend to Check blood sugar once per week.  -Continue Current therapy -[facility] to clarify Ozempic dose as Patient said the Pharmacy needed clarification. -Verified her copay was $0 -Patient said she would pick it up ASAP   Follow Up Plan:    Beecher Mcardle, PharmD, BCACP Clinical Pharmacist [phone]

## 2023-11-19 ENCOUNTER — Other Ambulatory Visit: Payer: Self-pay | Admitting: Nurse Practitioner

## 2023-11-19 DIAGNOSIS — H401133 Primary open-angle glaucoma, bilateral, severe stage: Secondary | ICD-10-CM | POA: Diagnosis not present

## 2023-11-19 DIAGNOSIS — Z961 Presence of intraocular lens: Secondary | ICD-10-CM | POA: Diagnosis not present

## 2023-11-19 DIAGNOSIS — E119 Type 2 diabetes mellitus without complications: Secondary | ICD-10-CM | POA: Diagnosis not present

## 2023-11-19 DIAGNOSIS — H35363 Drusen (degenerative) of macula, bilateral: Secondary | ICD-10-CM | POA: Diagnosis not present

## 2023-12-01 ENCOUNTER — Other Ambulatory Visit: Payer: Self-pay | Admitting: Nurse Practitioner

## 2023-12-14 DIAGNOSIS — N183 Chronic kidney disease, stage 3 unspecified: Secondary | ICD-10-CM | POA: Diagnosis not present

## 2023-12-14 DIAGNOSIS — E1122 Type 2 diabetes mellitus with diabetic chronic kidney disease: Secondary | ICD-10-CM | POA: Diagnosis not present

## 2023-12-14 DIAGNOSIS — I129 Hypertensive chronic kidney disease with stage 1 through stage 4 chronic kidney disease, or unspecified chronic kidney disease: Secondary | ICD-10-CM | POA: Diagnosis not present

## 2023-12-14 DIAGNOSIS — R809 Proteinuria, unspecified: Secondary | ICD-10-CM | POA: Diagnosis not present

## 2023-12-15 LAB — LAB REPORT - SCANNED
Albumin, Urine POC: 19.4
Albumin/Creatinine Ratio, Urine, POC: 20
Creatinine, POC: 97.5 mg/dL
EGFR: 34

## 2023-12-21 IMAGING — CT CT L SPINE W/O CM
3 series · 12 of 33 positions shown, 14 images · non-contrast
Comparison: Prior radiograph from 08/29/2008.

CLINICAL DATA: Initial evaluation for acute trauma, fall.



[Series 4: l-spine wo soft tissue · axial · 0.54mm/px · z∈[+236,+408]mm · 4 of 126 slices shown, 5 images]
[im 20/126  soft-tissue]
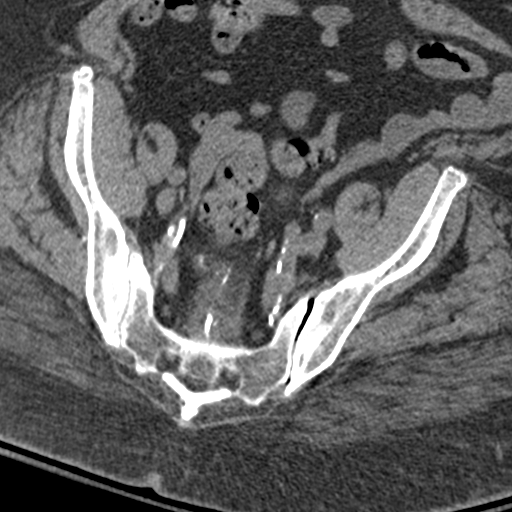
[im 20/126  bone]
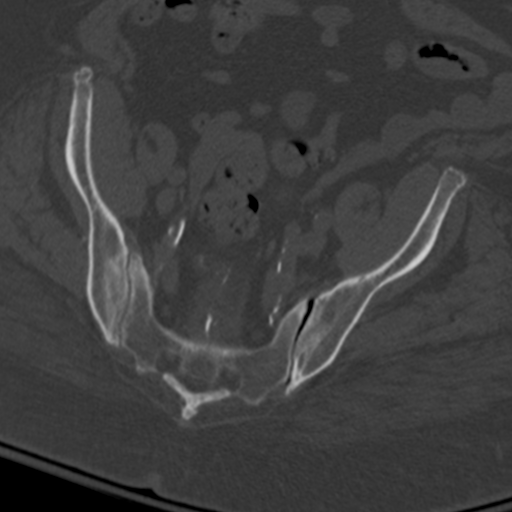
[im 49/126  bone]
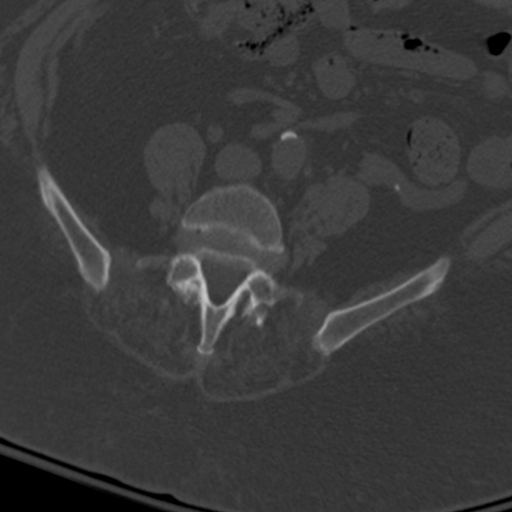
[im 77/126  bone]
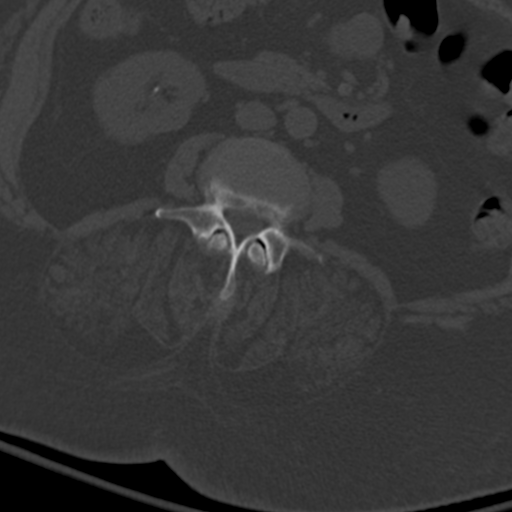
[im 106/126  bone]
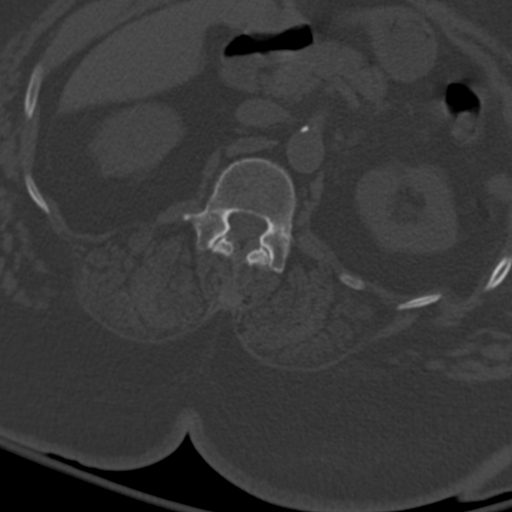

[Series 5: coronal bone · coronal · 0.35mm/px · 3 of 107 slices shown]
[im 22/107  bone]
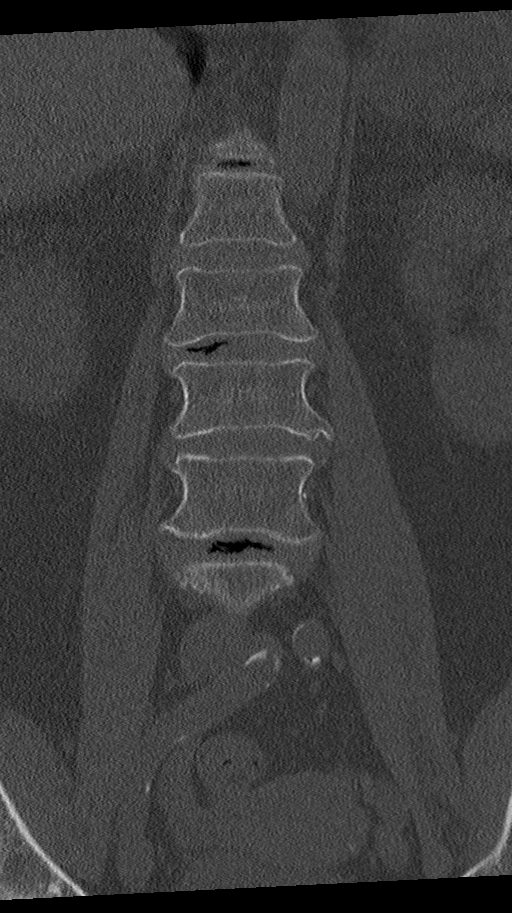
[im 43/107  bone]
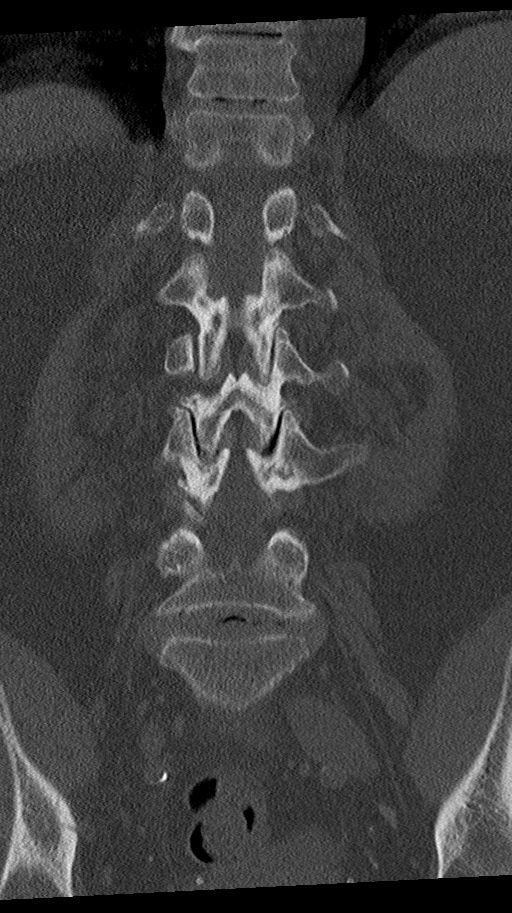
[im 64/107  bone]
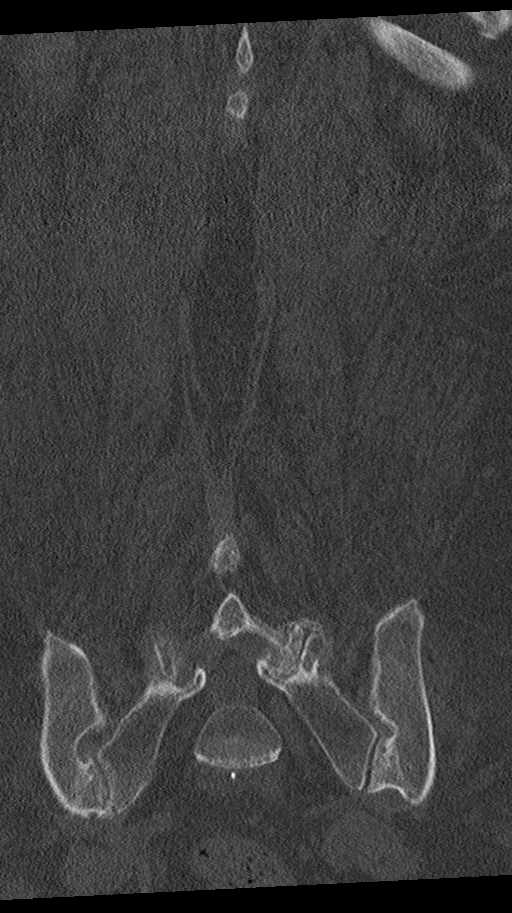

[Series 6: sagittal bone · sagittal · 0.42mm/px · 5 of 91 slices shown, 6 images]
[im 31/91  bone]
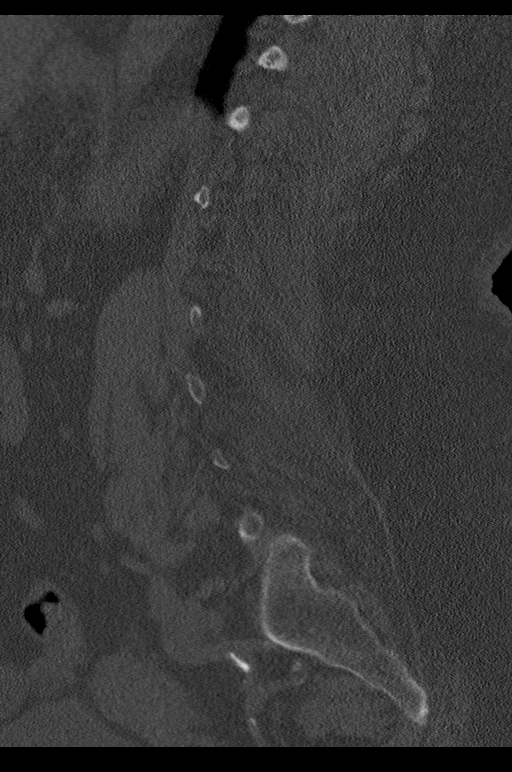
[im 38/91  bone]
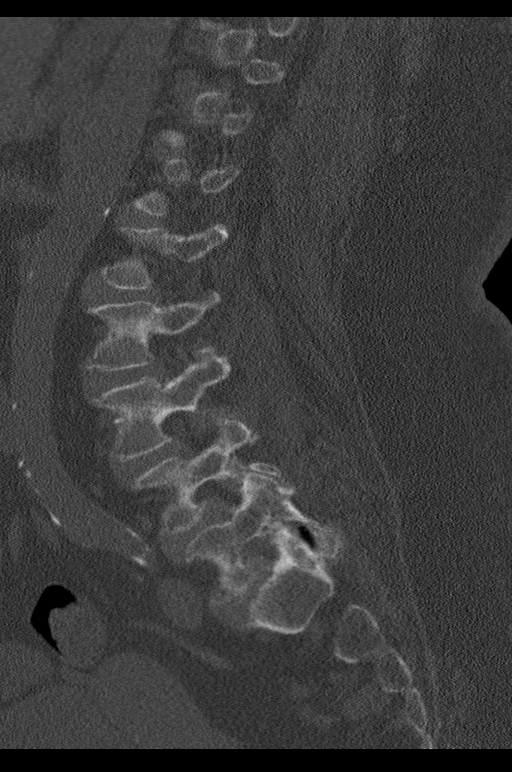
[im 46/91  soft-tissue]
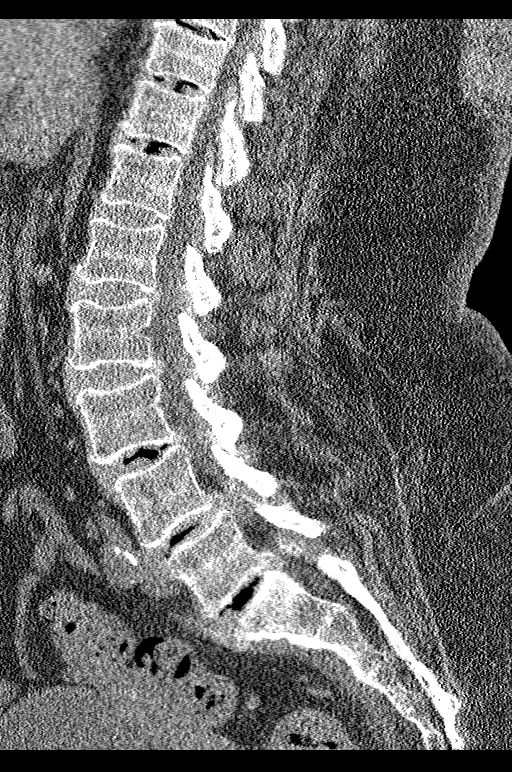
[im 46/91  bone]
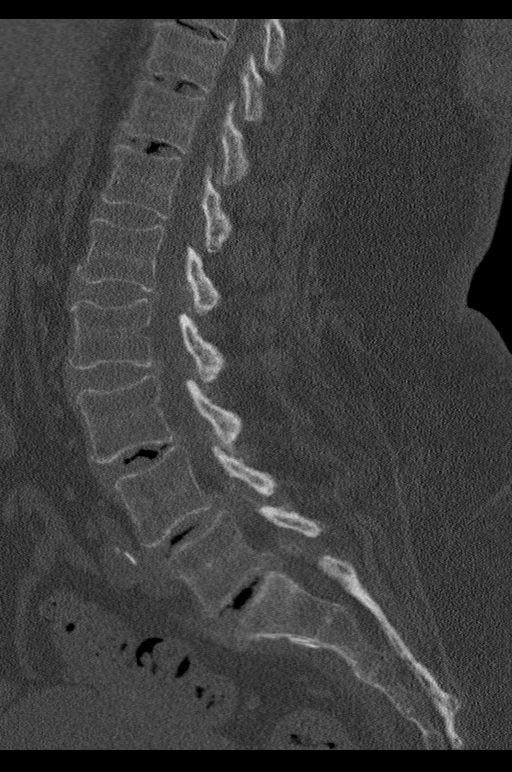
[im 53/91  bone]
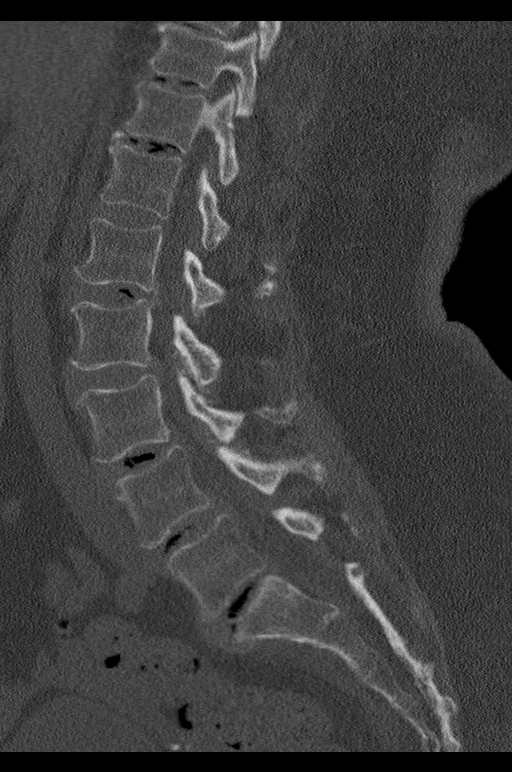
[im 61/91  bone]
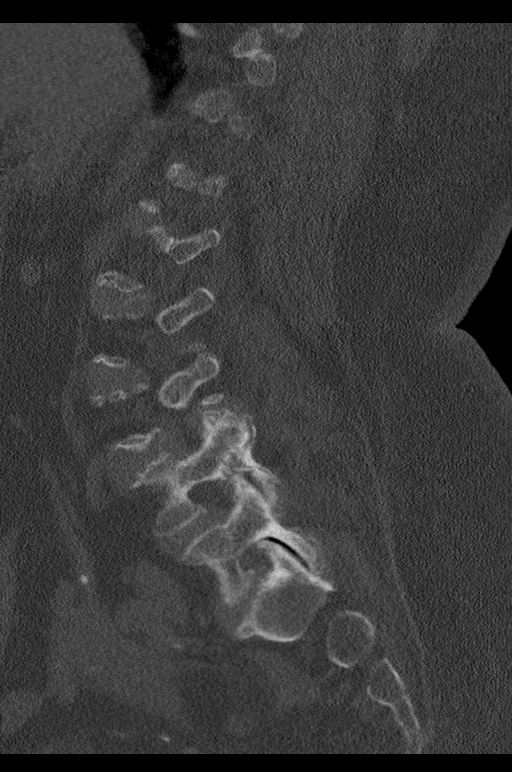

[12 of 33 positions shown; findings below may reference images not displayed]

FINDINGS: Segmentation: Standard. Lowest well-formed disc space labeled the
L5-S1 level.

Alignment: Trace facet mediated anterolisthesis of L2 on L3 through
L4 on L5. Alignment otherwise normal with preservation of the normal
lumbar lordosis.

Vertebrae: Vertebral body height maintained without acute or chronic
fracture. Visualized sacrum and pelvis intact. No discrete or
worrisome osseous lesions.

Paraspinal and other soft tissues: Paraspinous soft tissues
demonstrate no acute finding. Aortic atherosclerosis. Prior
cholecystectomy. Nonobstructive right renal nephrolithiasis. Sigmoid
diverticulosis.

Disc levels: Multilevel degenerative disc desiccation seen
throughout the visualized thoracolumbar spine. Moderate multilevel
facet arthrosis. No visible high-grade spinal stenosis.
IMPRESSION: 1. No acute traumatic injury within the lumbar spine.
2. Moderate multilevel degenerative spondylosis and facet arthrosis.
No visible high-grade spinal stenosis.
3. Nonobstructive right renal nephrolithiasis.
4. Sigmoid diverticulosis.
5. Aortic Atherosclerosis ([id number]).

## 2024-01-04 DIAGNOSIS — I10 Essential (primary) hypertension: Secondary | ICD-10-CM | POA: Diagnosis not present

## 2024-01-04 DIAGNOSIS — N183 Chronic kidney disease, stage 3 unspecified: Secondary | ICD-10-CM | POA: Diagnosis not present

## 2024-02-26 DIAGNOSIS — H35363 Drusen (degenerative) of macula, bilateral: Secondary | ICD-10-CM | POA: Diagnosis not present

## 2024-02-26 DIAGNOSIS — E119 Type 2 diabetes mellitus without complications: Secondary | ICD-10-CM | POA: Diagnosis not present

## 2024-02-26 DIAGNOSIS — H401133 Primary open-angle glaucoma, bilateral, severe stage: Secondary | ICD-10-CM | POA: Diagnosis not present

## 2024-02-26 DIAGNOSIS — Z961 Presence of intraocular lens: Secondary | ICD-10-CM | POA: Diagnosis not present

## 2024-02-26 LAB — HM DIABETES EYE EXAM

## 2024-02-29 ENCOUNTER — Other Ambulatory Visit: Payer: Self-pay | Admitting: Nurse Practitioner

## 2024-02-29 DIAGNOSIS — E1122 Type 2 diabetes mellitus with diabetic chronic kidney disease: Secondary | ICD-10-CM

## 2024-03-08 ENCOUNTER — Encounter: Payer: Self-pay | Admitting: Nurse Practitioner

## 2024-03-08 ENCOUNTER — Ambulatory Visit: Payer: 59 | Admitting: Nurse Practitioner

## 2024-03-08 VITALS — BP 130/76 | HR 72 | Temp 98.7°F | Ht 62.0 in | Wt 304.4 lb

## 2024-03-08 DIAGNOSIS — I700 Atherosclerosis of aorta: Secondary | ICD-10-CM | POA: Diagnosis not present

## 2024-03-08 DIAGNOSIS — E782 Mixed hyperlipidemia: Secondary | ICD-10-CM

## 2024-03-08 DIAGNOSIS — E1122 Type 2 diabetes mellitus with diabetic chronic kidney disease: Secondary | ICD-10-CM

## 2024-03-08 DIAGNOSIS — E039 Hypothyroidism, unspecified: Secondary | ICD-10-CM | POA: Diagnosis not present

## 2024-03-08 DIAGNOSIS — Z2821 Immunization not carried out because of patient refusal: Secondary | ICD-10-CM

## 2024-03-08 DIAGNOSIS — Z6841 Body Mass Index (BMI) 40.0 and over, adult: Secondary | ICD-10-CM

## 2024-03-08 DIAGNOSIS — I130 Hypertensive heart and chronic kidney disease with heart failure and stage 1 through stage 4 chronic kidney disease, or unspecified chronic kidney disease: Secondary | ICD-10-CM | POA: Diagnosis not present

## 2024-03-08 DIAGNOSIS — Z23 Encounter for immunization: Secondary | ICD-10-CM

## 2024-03-08 DIAGNOSIS — I5032 Chronic diastolic (congestive) heart failure: Secondary | ICD-10-CM | POA: Diagnosis not present

## 2024-03-08 DIAGNOSIS — I10 Essential (primary) hypertension: Secondary | ICD-10-CM

## 2024-03-08 DIAGNOSIS — N1832 Chronic kidney disease, stage 3b: Secondary | ICD-10-CM

## 2024-03-08 DIAGNOSIS — I110 Hypertensive heart disease with heart failure: Secondary | ICD-10-CM

## 2024-03-08 DIAGNOSIS — I129 Hypertensive chronic kidney disease with stage 1 through stage 4 chronic kidney disease, or unspecified chronic kidney disease: Secondary | ICD-10-CM

## 2024-03-08 LAB — BMP8+EGFR
BUN/Creatinine Ratio: 16 (ref 12–28)
BUN: 21 mg/dL (ref 8–27)
CO2: 20 mmol/L (ref 20–29)
Calcium: 9.7 mg/dL (ref 8.7–10.3)
Chloride: 99 mmol/L (ref 96–106)
Creatinine, Ser: 1.33 mg/dL — ABNORMAL HIGH (ref 0.57–1.00)
Glucose: 128 mg/dL — ABNORMAL HIGH (ref 70–99)
Potassium: 4.4 mmol/L (ref 3.5–5.2)
Sodium: 138 mmol/L (ref 134–144)
eGFR: 39 mL/min/{1.73_m2} — ABNORMAL LOW (ref >59)

## 2024-03-08 LAB — LIPID PANEL
Chol/HDL Ratio: 2.6 ratio (ref 0.0–4.4)
Cholesterol, Total: 142 mg/dL (ref 100–199)
HDL: 55 mg/dL (ref >39)
LDL Chol Calc (NIH): 71 mg/dL (ref 0–99)
Triglycerides: 84 mg/dL (ref 0–149)
VLDL Cholesterol Cal: 16 mg/dL (ref 5–40)

## 2024-03-08 LAB — HEMOGLOBIN A1C
Est. average glucose Bld gHb Est-mCnc: 166 mg/dL
Hgb A1c MFr Bld: 7.4 % — ABNORMAL HIGH (ref 4.8–5.6)

## 2024-03-08 MED ORDER — OZEMPIC (0.25 OR 0.5 MG/DOSE) 2 MG/3ML ~~LOC~~ SOPN: 0.5000 mg | PEN_INJECTOR | SUBCUTANEOUS | 2 refills | Status: DC

## 2024-03-08 MED ORDER — DEXCOM G7 SENSOR MISC: 2 refills | Status: DC

## 2024-03-08 MED ORDER — TRESIBA FLEXTOUCH 100 UNIT/ML ~~LOC~~ SOPN
15.0000 [IU] | PEN_INJECTOR | Freq: Every day | SUBCUTANEOUS | Status: AC
Start: 2024-03-08 — End: ?

## 2024-03-08 NOTE — Progress Notes (Signed)
 Sarah Weaver, CMA,acting as a Neurosurgeon for Sarah Ada, FNP.,have documented all relevant documentation on the behalf of Sarah Ada, FNP,as directed by  Sarah Ada, FNP while in the presence of Sarah Ada, FNP.  Subjective:  Patient ID: Sarah Weaver , female    DOB: 1936-05-12 , 88 y.o.   MRN: [id number]  Chief Complaint  Patient presents with   Hypertension    Patient presents today for a bp and dm follow up, Patient reports compliance with medication. Patient denies any chest pain, SOB, or headaches. Patient has no concerns today.    Diabetes   Hyperlipidemia    HPI  Patient presents for DM, BP check.  Seen Sarah Weaver with nephrology in March, increased losartan . Blurry vision and dizziness intermittently at that time but has since resolved, recent eye exam 6/13 with f/u in October.   Follows a regular diet, avoids salts.  Eats green veggies daily and berries, goes some days without eating meat, limits beef intake.  She has decreased complex carbs and breads.  Incorporates smoothies with fruit weekly.  Water intake has been 64 oz a day, drinks water throughout day.   Does not check her blood sugars at home, she is interested in a dexcom device where she doesn't have to stick herself so often.    Experiences lower extremity edema, decreases with rest and elevation, wears compression socks during the day.     Diabetes She presents for her follow-up diabetic visit. She has type 2 diabetes mellitus. Her disease course has been stable. Hypoglycemia symptoms include dizziness. Pertinent negatives for hypoglycemia include no headaches. Associated symptoms include blurred vision. Pertinent negatives for diabetes include no chest pain, no fatigue, no polydipsia, no polyphagia and no polyuria. Pertinent negatives for hypoglycemia complications include no blackouts and no hospitalization. Symptoms are stable. Diabetic complications include heart disease and peripheral  neuropathy. Risk factors for coronary artery disease include dyslipidemia, diabetes mellitus, hypertension, obesity and sedentary lifestyle. Current diabetic treatment includes oral agent (dual therapy). She is compliant with treatment all of the time. She is following a low salt diet. When asked about meal planning, she reported none. She has not had a previous visit with a dietitian. She never participates in exercise. Eye exam is current.  Hypertension This is a chronic problem. The problem has been gradually improving since onset. The problem is controlled. Associated symptoms include blurred vision and shortness of breath. Pertinent negatives include no chest pain, headaches or palpitations. Risk factors for coronary artery disease include diabetes mellitus, obesity, sedentary lifestyle and dyslipidemia. Past treatments include calcium  channel blockers. The current treatment provides mild improvement. Compliance problems include diet.  Hypertensive end-organ damage includes kidney disease, CAD/MI and heart failure. Identifiable causes of hypertension include chronic renal disease.     Past Medical History:  Diagnosis Date   Arthritis    all over   Chronic lower back pain    CKD Stage III 07/26/2018   Hx of hyperkalemia   Coronary artery disease    a. status post DES to the LAD 12/2015.   Glaucoma    HFpEF    Echocardiogram 12/2019: EF 60-65, no RWMA, mild LVH, Gr 1 DD, trivial MR, mild AoV sclerosis, no AS   Hyperkalemia    Hyperlipidemia    Hypertension    Mobitz (type) I (Wenckebach's) atrioventricular block 10/15/2021   Palpitations Zio Patch Monitor 5/21 NSR, avg HR 67, 1st degree AV block, intermittent Wenckebach during sleep, rare PACs/PVCs   Morbid obesity (HCC)  Palpitations    Type II diabetes mellitus (HCC)      Family History  Problem Relation Age of Onset   Heart disease Mother    Heart attack Mother    Hypertension Mother    Heart disease Sister    Cancer Sister     Stroke Sister      Current Outpatient Medications:    Accu-Chek Softclix Lancets lancets, USE UP TO 4 TIMES DAILY AS DIRECTED, Disp: 100 each, Rfl: 3   aspirin  EC 81 MG tablet, Take 81 mg by mouth daily., Disp: , Rfl:    atorvastatin  (LIPITOR ) 80 MG tablet, TAKE 1 TABLET BY MOUTH EVERYDAY AT BEDTIME, Disp: 90 tablet, Rfl: 2   BD PEN NEEDLE NANO 2ND GEN 32G X 4 MM MISC, USE NIGHTLY AS DIRECTED, Disp: 100 each, Rfl: 3   blood glucose meter kit and supplies KIT, Dispense based on patient and insurance preference. Use up to four times daily as directed. (FOR ICD-9 250.00, 250.01)., Disp: 1 each, Rfl: 0   Cholecalciferol  125 MCG (5000 UT) TABS, Take 1 tablet by mouth daily., Disp: , Rfl:    Continuous Glucose Sensor (DEXCOM G7 SENSOR) MISC, Use as directed to check blood sugars., Disp: 4 each, Rfl: 2   empagliflozin  (JARDIANCE ) 10 MG TABS tablet, Take 1 tablet (10 mg total) by mouth daily before breakfast., Disp: 90 tablet, Rfl: 3   ezetimibe  (ZETIA ) 10 MG tablet, Take 1 tablet (10 mg total) by mouth daily., Disp: 90 tablet, Rfl: 2   furosemide  (LASIX ) 40 MG tablet, Take 1 tablet (40 mg total) by mouth daily., Disp: 90 tablet, Rfl: 3   Latanoprostene Bunod 0.024 % SOLN, Apply 1 drop to eye daily., Disp: , Rfl:    levothyroxine  (SYNTHROID ) 50 MCG tablet, TAKE 1 TABLET BY MOUTH EVERY DAY BEFORE BREAKFAST, Disp: 90 tablet, Rfl: 1   linaclotide  (LINZESS ) 145 MCG CAPS capsule, TAKE 1 CAPSULE (145 MCG TOTAL) BY MOUTH DAILY BEFORE BREAKFAST. FOR CONSTIPATION, Disp: 90 capsule, Rfl: 2   losartan  (COZAAR ) 50 MG tablet, Take 100 mg by mouth at bedtime., Disp: , Rfl:    Magnesium  250 MG TABS, Take 1 tablet (250 mg total) by mouth every evening. Take with evening meal, Disp: 90 tablet, Rfl: 1   nebivolol  (BYSTOLIC ) 2.5 MG tablet, Take 1 tablet (2.5 mg total) by mouth daily., Disp: 90 tablet, Rfl: 3   nystatin  powder, Apply 1 Application topically 3 (three) times daily., Disp: 60 g, Rfl: 1   timolol  (TIMOPTIC) 0.5 % ophthalmic solution, 1 drop 3 (three) times daily., Disp: , Rfl:    traMADol -acetaminophen  (ULTRACET ) 37.5-325 MG tablet, TAKE 1 TABLET BY MOUTH EVERY 6 HOURS AS NEEDED FOR MODERATE BACK PAIN, Disp: 20 tablet, Rfl: 0   vitamin C (ASCORBIC ACID) 250 MG tablet, Take 250 mg by mouth daily., Disp: , Rfl:    Zinc Sulfate (ZINC-220 PO), Take 1 tablet by mouth daily., Disp: , Rfl:    insulin  degludec (TRESIBA  FLEXTOUCH) 100 UNIT/ML FlexTouch Pen, Inject 15 Units into the skin daily., Disp: , Rfl:    ONETOUCH VERIO test strip, CHECK BLOOD SUGARS ONCE DAILY (Patient not taking: Reported on 03/08/2024), Disp: 50 strip, Rfl: 11   Semaglutide ,0.25 or 0.5MG /DOS, (OZEMPIC , 0.25 OR 0.5 MG/DOSE,) 2 MG/3ML SOPN, Inject 0.5 mg into the skin once a week., Disp: 9 mL, Rfl: 2   Allergies  Allergen Reactions   Carvedilol  Cough    Pt reports causes her to cough   Lisinopril  Cough  REPORTS CAUSES DRY COUGH     Review of Systems  Constitutional:  Negative for chills, fatigue, fever and unexpected weight change.  Eyes:  Positive for blurred vision.  Respiratory:  Positive for shortness of breath.   Cardiovascular:  Negative for chest pain and palpitations.  Endocrine: Negative for polydipsia, polyphagia and polyuria.  Genitourinary:  Negative for decreased urine volume, difficulty urinating and dysuria.  Neurological:  Positive for dizziness. Negative for headaches.     Today's Vitals   03/08/24 0828 03/08/24 0850  BP: (!) 140/80 130/76  Pulse: 72   Temp: 98.7 F (37.1 C)   TempSrc: Oral   Weight: (!) 304 lb 6.4 oz (138.1 kg)   Height: 5' 2 (1.575 m)   PainSc: 0-No pain    Body mass index is 55.68 kg/m.  Wt Readings from Last 3 Encounters:  03/08/24 (!) 304 lb 6.4 oz (138.1 kg)  09/02/23 298 lb (135.2 kg)  09/02/23 298 lb 3.2 oz (135.3 kg)     Objective:  Physical Exam Constitutional:      Appearance: She is obese.  HENT:     Head: Normocephalic and atraumatic.  Neck:      Vascular: No carotid bruit.  Cardiovascular:     Rate and Rhythm: Normal rate and regular rhythm.     Pulses: Normal pulses.     Heart sounds: Normal heart sounds.  Pulmonary:     Effort: Pulmonary effort is normal.     Breath sounds: Normal breath sounds.  Musculoskeletal:     Right lower leg: Edema (1+) present.     Left lower leg: Edema (1+) present.  Skin:    General: Skin is warm and dry.     Capillary Refill: Capillary refill takes less than 2 seconds.  Neurological:     Mental Status: She is alert.  Psychiatric:        Mood and Affect: Mood normal.        Behavior: Behavior normal.        Thought Content: Thought content normal.        Judgment: Judgment normal.         Assessment And Plan:  Type 2 diabetes mellitus with stage 3b chronic kidney disease, without long-term current use of insulin  (HCC) Assessment & Plan: Ozempic  increased to 0.5 mg.  Dexcom education done in office today.  Hgb A1c drawn  Orders: -     BMP8+eGFR -     Hemoglobin A1c -     Ozempic  (0.25 or 0.5 MG/DOSE); Inject 0.5 mg into the skin once a week.  Dispense: 9 mL; Refill: 2 -     Dexcom G7 Sensor; Use as directed to check blood sugars.  Dispense: 4 each; Refill: 2 -     Tresiba  FlexTouch; Inject 15 Units into the skin daily.  Mixed hyperlipidemia Assessment & Plan: Cholesterol levels are stable, continue current medications.   Orders: -     Lipid panel  Hypertensive nephropathy Assessment & Plan: Blood pressure is controlled, continue current medications   Herpes zoster vaccination declined Assessment & Plan: Declines shingrix, educated on disease process and is aware if he changes his mind to notify office    COVID-19 vaccine administered -     Advertising account planner Vaccine 27yrs & older  Morbid obesity with BMI of 50.0-59.9, adult (HCC) Assessment & Plan: She is encouraged to strive for BMI less than 30 to decrease cardiac risk. Advised to aim for at least 150 minutes  of  exercise per week.    Acquired hypothyroidism Assessment & Plan: TSH drawn, stable on levothyroxine  50 mcg daily.    Orders: -     TSH + free T4  Atherosclerosis of aorta (HCC) Assessment & Plan: Continue statin, tolerating well.   Hypertensive heart disease with chronic diastolic congestive heart failure (HCC) Assessment & Plan: Losartan  increased to 100 mg in March this year by nephrology, stable.  BMPeGFR and lipid panel drawn.   Type 2 diabetes mellitus with stage 3b chronic kidney disease, without long-term current use of insulin  (HCC) Assessment & Plan: Ozempic  increased to 0.5 mg.  Dexcom education done in office today.  Hgb A1c drawn  Orders: -     BMP8+eGFR -     Hemoglobin A1c -     Ozempic  (0.25 or 0.5 MG/DOSE); Inject 0.5 mg into the skin once a week.  Dispense: 9 mL; Refill: 2 -     Dexcom G7 Sensor; Use as directed to check blood sugars.  Dispense: 4 each; Refill: 2 -     Tresiba  FlexTouch; Inject 15 Units into the skin daily.  Chronic heart failure with preserved ejection fraction [facility]) Assessment & Plan: Continue f/u with Cardiology     Return for keep same next.  Patient was given opportunity to ask questions. Patient verbalized understanding of the plan and was able to repeat key elements of the plan. All questions were answered to their satisfaction.   I have reviewed this encounter including the documentation in this note and/or discussed this patient with Delon Louder FNP Student. I am certifying that I agree with the content of this note as the primary care nurse practitioner.  Sarah Ada, DNP, FNP-BC  I, Sarah Ada, FNP, have reviewed all documentation for this visit. The documentation on [id number] for the exam, diagnosis, procedures, and orders are all accurate and complete.   IF YOU HAVE BEEN REFERRED TO A SPECIALIST, IT MAY TAKE 1-2 WEEKS TO SCHEDULE/PROCESS THE REFERRAL. IF YOU HAVE NOT HEARD FROM US /SPECIALIST IN TWO WEEKS, PLEASE  GIVE US  A CALL AT [phone].

## 2024-03-08 NOTE — Assessment & Plan Note (Addendum)
 Ozempic  increased to 0.5 mg.  Dexcom education done in office today.  Hgb A1c drawn

## 2024-03-08 NOTE — Assessment & Plan Note (Addendum)
 Losartan  increased to 100 mg in March this year by nephrology, stable.  BMPeGFR and lipid panel drawn.

## 2024-03-08 NOTE — Assessment & Plan Note (Addendum)
 TSH drawn, stable on levothyroxine  50 mcg daily.

## 2024-03-13 ENCOUNTER — Ambulatory Visit: Payer: Self-pay | Admitting: Nurse Practitioner

## 2024-03-13 ENCOUNTER — Encounter: Payer: Self-pay | Admitting: Nurse Practitioner

## 2024-03-13 NOTE — Assessment & Plan Note (Signed)
 Cholesterol levels are stable, continue current medications.

## 2024-03-13 NOTE — Assessment & Plan Note (Signed)
 Declines shingrix, educated on disease process and is aware if he changes his mind to notify office

## 2024-03-13 NOTE — Assessment & Plan Note (Signed)
Continue f/u with Cardiology.

## 2024-03-16 DIAGNOSIS — I700 Atherosclerosis of aorta: Secondary | ICD-10-CM | POA: Insufficient documentation

## 2024-03-16 NOTE — Assessment & Plan Note (Signed)
 She is encouraged to strive for BMI less than 30 to decrease cardiac risk. Advised to aim for at least 150 minutes of exercise per week.

## 2024-03-16 NOTE — Progress Notes (Signed)
 Updated

## 2024-03-16 NOTE — Assessment & Plan Note (Signed)
 Continue statin, tolerating well

## 2024-03-16 NOTE — Assessment & Plan Note (Signed)
 Blood pressure is controlled, continue current medications

## 2024-03-22 ENCOUNTER — Other Ambulatory Visit: Payer: Self-pay | Admitting: Nurse Practitioner

## 2024-03-22 DIAGNOSIS — E782 Mixed hyperlipidemia: Secondary | ICD-10-CM

## 2024-03-27 ENCOUNTER — Other Ambulatory Visit: Payer: Self-pay | Admitting: Cardiovascular Disease

## 2024-03-30 ENCOUNTER — Other Ambulatory Visit: Payer: Self-pay

## 2024-03-30 MED ORDER — NYSTATIN 100000 UNIT/GM EX POWD: 1.0000 | Freq: Three times a day (TID) | CUTANEOUS | 2 refills | Status: DC

## 2024-03-31 ENCOUNTER — Other Ambulatory Visit: Payer: Self-pay

## 2024-03-31 MED ORDER — NYSTATIN 100000 UNIT/GM EX POWD: 1.0000 | Freq: Three times a day (TID) | CUTANEOUS | 2 refills | Status: AC

## 2024-04-25 ENCOUNTER — Other Ambulatory Visit: Payer: Self-pay | Admitting: *Deleted

## 2024-04-25 MED ORDER — EMPAGLIFLOZIN 10 MG PO TABS: 10.0000 mg | ORAL_TABLET | Freq: Every day | ORAL | 0 refills | Status: AC

## 2024-07-05 ENCOUNTER — Ambulatory Visit: Attending: Internal Medicine | Admitting: Internal Medicine

## 2024-07-05 ENCOUNTER — Encounter: Payer: Self-pay | Admitting: Internal Medicine

## 2024-07-05 VITALS — BP 138/82 | HR 73 | Ht 62.0 in | Wt 293.6 lb

## 2024-07-05 DIAGNOSIS — I5032 Chronic diastolic (congestive) heart failure: Secondary | ICD-10-CM | POA: Diagnosis not present

## 2024-07-05 DIAGNOSIS — E785 Hyperlipidemia, unspecified: Secondary | ICD-10-CM | POA: Diagnosis not present

## 2024-07-05 DIAGNOSIS — I10 Essential (primary) hypertension: Secondary | ICD-10-CM | POA: Diagnosis not present

## 2024-07-05 DIAGNOSIS — R002 Palpitations: Secondary | ICD-10-CM | POA: Diagnosis not present

## 2024-07-05 DIAGNOSIS — I2510 Atherosclerotic heart disease of native coronary artery without angina pectoris: Secondary | ICD-10-CM | POA: Diagnosis not present

## 2024-07-05 DIAGNOSIS — E66813 Obesity, class 3: Secondary | ICD-10-CM

## 2024-07-05 DIAGNOSIS — N183 Chronic kidney disease, stage 3 unspecified: Secondary | ICD-10-CM | POA: Diagnosis not present

## 2024-07-05 NOTE — Patient Instructions (Signed)
 Medication Instructions:  No changes *If you need a refill on your cardiac medications before your next appointment, please call your pharmacy*  Lab Work: None ordered If you have labs (blood work) drawn today and your tests are completely normal, you will receive your results only by: MyChart Message (if you have MyChart) OR A paper copy in the mail If you have any lab test that is abnormal or we need to change your treatment, we will call you to review the results.  Testing/Procedures: None ordered  Follow-Up: At [facility], you and your health needs are our priority.  As part of our continuing mission to provide you with exceptional heart care, our providers are all part of one team.  This team includes your primary Cardiologist (physician) and Advanced Practice Providers or APPs (Physician Assistants and Nurse Practitioners) who all work together to provide you with the care you need, when you need it.  Your next appointment:   1 year(s)  Provider:   Dr Mona  We recommend signing up for the patient portal called MyChart.  Sign up information is provided on this After Visit Summary.  MyChart is used to connect with patients for Virtual Visits (Telemedicine).  Patients are able to view lab/test results, encounter notes, upcoming appointments, etc.  Non-urgent messages can be sent to your provider as well.   To learn more about what you can do with MyChart, go to [email or web address].

## 2024-07-05 NOTE — Progress Notes (Signed)
 OFFICE NOTE  Chief Complaint:  Establish cardiologist  Primary Care Physician: Georgina Speaks, FNP  HPI:  Sarah Weaver is a 88 y.o. female with a past medial history significant for heart failure with preserved ejection fraction, morbid obesity, stage III chronic kidney disease, coronary artery disease status post DES to the LAD in April 2017, and insulin -dependent type 2 diabetes as well as palpitations.  She was previously followed by Dr. Alveta but had seen several cardiologist prior to that as well.  Overall she seems to be doing fairly well.  She lives with her daughter who accompanied her to the visit today.  She has been working with her primary care provider to improve her glycemic control.  A1c recently was 7.4%.  She has been on Ozempic , Jardiance  and insulin .  She had a recent increase in her Ozempic  dosing.  Weight has stayed fairly stable.  Lipids in June showed total cholesterol 142, HDL 55, triglycerides 84 and LDL 71.  She is near goal LDL less than 70.  She denies any chest pain or worsening shortness of breath.  She does get some occasional leg edema and does take Lasix  40 mg daily.  She has a walker for assistance.  PMHx:  Past Medical History:  Diagnosis Date   Arthritis    all over   Chronic lower back pain    CKD Stage III 07/26/2018   Hx of hyperkalemia   Coronary artery disease    a. status post DES to the LAD 12/2015.   Glaucoma    HFpEF    Echocardiogram 12/2019: EF 60-65, no RWMA, mild LVH, Gr 1 DD, trivial MR, mild AoV sclerosis, no AS   Hyperkalemia    Hyperlipidemia    Hypertension    Mobitz (type) I (Wenckebach's) atrioventricular block 10/15/2021   Palpitations Zio Patch Monitor 5/21 NSR, avg HR 67, 1st degree AV block, intermittent Wenckebach during sleep, rare PACs/PVCs   Morbid obesity (HCC)    Palpitations    Type II diabetes mellitus (HCC)     Past Surgical History:  Procedure Laterality Date   CARDIAC CATHETERIZATION N/A  12/20/2015   Procedure: Left Heart Cath and Coronary Angiography;  Surgeon: Dorn JINNY Lesches, MD;  Location: [facility] INVASIVE CV LAB;  Service: Cardiovascular;  Laterality: N/A;   CARDIAC CATHETERIZATION N/A 12/20/2015   Procedure: Coronary Stent Intervention;  Surgeon: Dorn JINNY Lesches, MD;  Location: [facility] INVASIVE CV LAB;  Service: Cardiovascular;  Laterality: N/A;  mid LAD 2.5 x 20 Synergy   CATARACT EXTRACTION W/ INTRAOCULAR LENS  IMPLANT, BILATERAL Bilateral    CORONARY ANGIOPLASTY WITH STENT PLACEMENT  12/20/2015   LAPAROSCOPIC CHOLECYSTECTOMY  04/23/11   TONSILLECTOMY      FAMHx:  Family History  Problem Relation Age of Onset   Heart disease Mother    Heart attack Mother    Hypertension Mother    Heart disease Sister    Cancer Sister    Stroke Sister     SOCHx:   reports that she has never smoked. She has never used smokeless tobacco. She reports that she does not drink alcohol and does not use drugs.  ALLERGIES:  Allergies  Allergen Reactions   Carvedilol  Cough    Pt reports causes her to cough   Lisinopril  Cough    REPORTS CAUSES DRY COUGH    ROS: Pertinent items noted in HPI and remainder of comprehensive ROS otherwise negative.  HOME MEDS: Current Outpatient Medications on File Prior to Visit  Medication Sig  Dispense Refill   Accu-Chek Softclix Lancets lancets USE UP TO 4 TIMES DAILY AS DIRECTED 100 each 3   aspirin  EC 81 MG tablet Take 81 mg by mouth daily.     atorvastatin  (LIPITOR ) 80 MG tablet TAKE 1 TABLET BY MOUTH EVERYDAY AT BEDTIME 90 tablet 2   BD PEN NEEDLE NANO 2ND GEN 32G X 4 MM MISC USE NIGHTLY AS DIRECTED 100 each 3   blood glucose meter kit and supplies KIT Dispense based on patient and insurance preference. Use up to four times daily as directed. (FOR ICD-9 250.00, 250.01). 1 each 0   Cholecalciferol  125 MCG (5000 UT) TABS Take 1 tablet by mouth daily.     Continuous Glucose Sensor (DEXCOM G7 SENSOR) MISC Use as directed to check blood sugars. 4 each 2    empagliflozin  (JARDIANCE ) 10 MG TABS tablet Take 1 tablet (10 mg total) by mouth daily before breakfast. 90 tablet 0   ezetimibe  (ZETIA ) 10 MG tablet Take 1 tablet (10 mg total) by mouth daily. 90 tablet 2   furosemide  (LASIX ) 40 MG tablet TAKE 1 TABLET BY MOUTH EVERY DAY 90 tablet 0   insulin  degludec (TRESIBA  FLEXTOUCH) 100 UNIT/ML FlexTouch Pen Inject 15 Units into the skin daily.     Latanoprostene Bunod 0.024 % SOLN Apply 1 drop to eye daily.     levothyroxine  (SYNTHROID ) 50 MCG tablet TAKE 1 TABLET BY MOUTH EVERY DAY BEFORE BREAKFAST 90 tablet 1   linaclotide  (LINZESS ) 145 MCG CAPS capsule TAKE 1 CAPSULE (145 MCG TOTAL) BY MOUTH DAILY BEFORE BREAKFAST. FOR CONSTIPATION 90 capsule 2   losartan  (COZAAR ) 100 MG tablet Take 100 mg by mouth at bedtime.     Magnesium  250 MG TABS Take 1 tablet (250 mg total) by mouth every evening. Take with evening meal 90 tablet 1   nebivolol  (BYSTOLIC ) 2.5 MG tablet TAKE 1 TABLET BY MOUTH EVERY DAY 90 tablet 0   nystatin  powder Apply 1 Application topically 3 (three) times daily. 60 g 2   ONETOUCH VERIO test strip CHECK BLOOD SUGARS ONCE DAILY 50 strip 11   Semaglutide ,0.25 or 0.5MG /DOS, (OZEMPIC , 0.25 OR 0.5 MG/DOSE,) 2 MG/3ML SOPN Inject 0.5 mg into the skin once a week. 9 mL 2   timolol (TIMOPTIC) 0.5 % ophthalmic solution 1 drop 3 (three) times daily.     traMADol -acetaminophen  (ULTRACET ) 37.5-325 MG tablet TAKE 1 TABLET BY MOUTH EVERY 6 HOURS AS NEEDED FOR MODERATE BACK PAIN 20 tablet 0   vitamin C (ASCORBIC ACID) 250 MG tablet Take 250 mg by mouth daily.     Zinc Sulfate (ZINC-220 PO) Take 1 tablet by mouth daily.     No current facility-administered medications on file prior to visit.    LABS/IMAGING: No results found for this or any previous visit (from the past 48 hours). No results found.  LIPID PANEL:    Component Value Date/Time   CHOL 142 03/08/2024 0932   TRIG 84 03/08/2024 0932   HDL 55 03/08/2024 0932   CHOLHDL 2.6 03/08/2024 0932    CHOLHDL 3.3 12/21/2015 0400   VLDL 15 12/21/2015 0400   LDLCALC 71 03/08/2024 0932     WEIGHTS: Wt Readings from Last 3 Encounters:  03/08/24 (!) 304 lb 6.4 oz (138.1 kg)  09/02/23 298 lb (135.2 kg)  09/02/23 298 lb 3.2 oz (135.3 kg)    VITALS: There were no vitals taken for this visit.  EXAM: General appearance: alert, no distress, and morbidly obese Neck: no carotid bruit, no  JVD, and thyroid  not enlarged, symmetric, no tenderness/mass/nodules Lungs: clear to auscultation bilaterally Heart: regular rate and rhythm, S1, S2 normal, no murmur, click, rub or gallop Abdomen: soft, non-tender; bowel sounds normal; no masses,  no organomegaly and obese Extremities: edema trivial ankle bilateral Pulses: 2+ and symmetric Skin: Skin color, texture, turgor normal. No rashes or lesions Neurologic: Grossly normal Psych: Pleasant  EKG: EKG Interpretation Date/Time:  Tuesday July 05 2024 08:13:27 EDT Ventricular Rate:  73 PR Interval:  188 QRS Duration:  76 QT Interval:  360 QTC Calculation: 396 R Axis:   -17  Text Interpretation: Normal sinus rhythm Anterolateral infarct (cited on or before 11-Apr-2022) When compared with ECG of 11-Apr-2022 02:20, No significant change was found Confirmed by Mona Kent [phone]) on 07/05/2024 8:14:48 AM    ASSESSMENT: Chronic HFpEF CAD status post PCI to the LAD in 2017 Dyslipidemia, goal LDL less than 70 Type 2 diabetes-insulin -dependent, A1c 7.4% Morbid obesity Palpitations CKD 3-follows with Dr. Marlee  PLAN: 1.   Ms. Colvin has a combination of coronary artery disease and chronic heart failure with preserved ejection fraction.  Her last EF was 60 to 65% by echo in 2023.  She denies any current heart failure symptoms and would be considered NYHA class I-II.  Lipids are reasonably well-controlled but her target LDL is less than 70 and this has been going up somewhat recently.  I have encouraged her to watch dietary intake of  saturated fats.  She denies any recurrent palpitations.  Her kidney functions been stable.  Blood pressure is a little elevated although her nephrologist recently increased her losartan .  She has not had any chest pain.  Overall volume status appears stable.  Will continue with current medications.  Plan follow-up annually or sooner as necessary.  Kent KYM Mona, MD, [facility], FNLA, FACP  [place]  [facility]  Medical Director of the [facility] Lipid [facility]  Cardiovascular Risk Reduction [facility] Diplomate of the American Board of Clinical Lipidology Attending Cardiologist  Direct Dial: [phone]  Fax: [phone]  Website:  [email or web address]   Kent BROCKS Jorene Kaylor 07/05/2024, 8:14 AM

## 2024-08-05 ENCOUNTER — Other Ambulatory Visit: Payer: Self-pay | Admitting: Nurse Practitioner

## 2024-08-05 DIAGNOSIS — N1832 Chronic kidney disease, stage 3b: Secondary | ICD-10-CM

## 2024-09-08 ENCOUNTER — Other Ambulatory Visit: Payer: Self-pay | Admitting: Nurse Practitioner

## 2024-09-12 ENCOUNTER — Other Ambulatory Visit: Payer: Self-pay

## 2024-09-13 MED ORDER — NEBIVOLOL HCL 2.5 MG PO TABS: 2.5000 mg | ORAL_TABLET | Freq: Every day | ORAL | 3 refills | Status: AC

## 2024-09-13 MED ORDER — EZETIMIBE 10 MG PO TABS: 10.0000 mg | ORAL_TABLET | Freq: Every day | ORAL | 3 refills | Status: AC

## 2024-09-13 MED ORDER — FUROSEMIDE 40 MG PO TABS: 40.0000 mg | ORAL_TABLET | Freq: Every day | ORAL | 3 refills | Status: AC

## 2024-09-14 ENCOUNTER — Ambulatory Visit: Payer: 59

## 2024-09-14 ENCOUNTER — Ambulatory Visit: Payer: Self-pay | Admitting: Nurse Practitioner

## 2024-09-14 ENCOUNTER — Encounter: Payer: Self-pay | Admitting: Nurse Practitioner

## 2024-09-14 VITALS — BP 120/80 | HR 79 | Temp 98.9°F | Ht 62.0 in | Wt 299.6 lb

## 2024-09-14 DIAGNOSIS — E1122 Type 2 diabetes mellitus with diabetic chronic kidney disease: Secondary | ICD-10-CM

## 2024-09-14 DIAGNOSIS — Z23 Encounter for immunization: Secondary | ICD-10-CM | POA: Diagnosis not present

## 2024-09-14 DIAGNOSIS — N1832 Chronic kidney disease, stage 3b: Secondary | ICD-10-CM

## 2024-09-14 DIAGNOSIS — E039 Hypothyroidism, unspecified: Secondary | ICD-10-CM

## 2024-09-14 DIAGNOSIS — Z2821 Immunization not carried out because of patient refusal: Secondary | ICD-10-CM | POA: Diagnosis not present

## 2024-09-14 DIAGNOSIS — M25562 Pain in left knee: Secondary | ICD-10-CM | POA: Diagnosis not present

## 2024-09-14 DIAGNOSIS — I5032 Chronic diastolic (congestive) heart failure: Secondary | ICD-10-CM | POA: Diagnosis not present

## 2024-09-14 DIAGNOSIS — E2839 Other primary ovarian failure: Secondary | ICD-10-CM

## 2024-09-14 DIAGNOSIS — I129 Hypertensive chronic kidney disease with stage 1 through stage 4 chronic kidney disease, or unspecified chronic kidney disease: Secondary | ICD-10-CM

## 2024-09-14 DIAGNOSIS — I700 Atherosclerosis of aorta: Secondary | ICD-10-CM | POA: Diagnosis not present

## 2024-09-14 DIAGNOSIS — E782 Mixed hyperlipidemia: Secondary | ICD-10-CM | POA: Diagnosis not present

## 2024-09-14 DIAGNOSIS — I130 Hypertensive heart and chronic kidney disease with heart failure and stage 1 through stage 4 chronic kidney disease, or unspecified chronic kidney disease: Secondary | ICD-10-CM

## 2024-09-14 LAB — BMP8+EGFR
BUN/Creatinine Ratio: 20 (ref 12–28)
BUN: 28 mg/dL — ABNORMAL HIGH (ref 8–27)
CO2: 24 mmol/L (ref 20–29)
Calcium: 9.6 mg/dL (ref 8.7–10.3)
Chloride: 101 mmol/L (ref 96–106)
Creatinine, Ser: 1.43 mg/dL — ABNORMAL HIGH (ref 0.57–1.00)
Glucose: 153 mg/dL — ABNORMAL HIGH (ref 70–99)
Potassium: 4.6 mmol/L (ref 3.5–5.2)
Sodium: 140 mmol/L (ref 134–144)
eGFR: 36 mL/min/1.73 — ABNORMAL LOW

## 2024-09-14 LAB — LIPID PANEL
Chol/HDL Ratio: 2.3 ratio (ref 0.0–4.4)
Cholesterol, Total: 117 mg/dL (ref 100–199)
HDL: 50 mg/dL
LDL Chol Calc (NIH): 53 mg/dL (ref 0–99)
Triglycerides: 68 mg/dL (ref 0–149)
VLDL Cholesterol Cal: 14 mg/dL (ref 5–40)

## 2024-09-14 LAB — TSH+FREE T4
Free T4: 1.15 ng/dL (ref 0.82–1.77)
TSH: 2.26 u[IU]/mL (ref 0.450–4.500)

## 2024-09-14 LAB — HEMOGLOBIN A1C
Est. average glucose Bld gHb Est-mCnc: 160 mg/dL
Hgb A1c MFr Bld: 7.2 % — ABNORMAL HIGH (ref 4.8–5.6)

## 2024-09-14 MED ORDER — DICLOFENAC SODIUM 1 % EX GEL: 2.0000 g | Freq: Four times a day (QID) | CUTANEOUS | 2 refills | Status: AC

## 2024-09-14 NOTE — Patient Instructions (Signed)
                         Contains text generated by Abridge.                                 Contains text generated by Abridge.

## 2024-09-14 NOTE — Progress Notes (Signed)
 [name], CMA,acting as a neurosurgeon for Sarah Ada, FNP.,have documented all relevant documentation on the behalf of Sarah Ada, FNP,as directed by  Sarah Ada, FNP while in the presence of Sarah Ada, FNP.  Subjective:  Patient ID: Fred[place] Lang Crimes , female    DOB: 21-May-1936 , 88 y.o.   MRN: [id number]  Chief Complaint  Patient presents with   Hypertension    Patient presents today for a bp and dm follow up, Patient reports compliance with medication. Patient denies any chest pain, SOB, or headaches.    Knee Pain    Patient reports she has a throbbing pain in her left knee when walking, she would like to try a pain cream or something to help her with the pain.      She has an appt with her Nephrologist- Dr. Marlee next month. She also has a new Cardiologist - Dr. Mona she is to see him October 2026. She is scheduled to see Dr. Octavia in March 2026  Diabetes She presents for her follow-up diabetic visit. She has type 2 diabetes mellitus. Her disease course has been stable. Pertinent negatives for hypoglycemia include no dizziness or headaches. Pertinent negatives for diabetes include no blurred vision, no chest pain, no fatigue, no polydipsia, no polyphagia and no polyuria. Pertinent negatives for hypoglycemia complications include no blackouts and no hospitalization. Symptoms are stable. Diabetic complications include heart disease, nephropathy and peripheral neuropathy. Risk factors for coronary artery disease include dyslipidemia, diabetes mellitus, hypertension, obesity and sedentary lifestyle. Current diabetic treatment includes oral agent (dual therapy). She is compliant with treatment all of the time. She is following a low salt diet. When asked about meal planning, she reported none. She has not had a previous visit with a dietitian. She never participates in exercise. (Blood sugar today was 134) Eye exam is current.  Hypertension This is a chronic problem. The  problem has been gradually improving since onset. The problem is controlled. Pertinent negatives include no blurred vision, chest pain, headaches, palpitations or shortness of breath. Risk factors for coronary artery disease include diabetes mellitus, obesity, sedentary lifestyle and dyslipidemia. Past treatments include calcium  channel blockers. The current treatment provides mild improvement. Compliance problems include diet.  Hypertensive end-organ damage includes kidney disease, CAD/MI and heart failure. Identifiable causes of hypertension include chronic renal disease and a thyroid  problem.  Knee Pain  The incident occurred 5 to 7 days ago (Occurred on Christmas Day). The injury mechanism was a twisting injury. The quality of the pain is described as aching. The pain is at a severity of 8/10. Pertinent negatives include no inability to bear weight.    Discussed the use of AI scribe software for clinical note transcription with the patient, who gave verbal consent to proceed.  History of Present Illness Meriah Shands is an 88 year old female with diabetes and knee pain who presents for a follow-up visit.  She is managing her diabetes with Ozempic , recently increased to 0.5 mg, and Tresiba . She regularly monitors her blood sugar, with a reading of 134 mg/dL this morning. Her A1c has improved over the past year, decreasing from 9.8% to 7.4%. She previously had issues obtaining Ozempic  but resolved them after speaking with a pharmacist.  She experiences aching pain in her left knee, which began on Christmas Day. The pain is exacerbated by movement, particularly when pulling herself back, and she rates it as an 8 out of 10 at its worst. The pain is described as 'very  tender and hurt some,' especially when walking. She is concerned about the pain worsening to the point where she cannot sit comfortably.  She has upcoming appointments with her kidney doctor in January and her heart doctor,  Dr. Mona, whom she has seen once, in a year. She also has an eye doctor appointment with Dr. Jens in March.  She has two great-grandchildren who attend school and visit her regularly, and she is cautious about potential exposure to illnesses, especially during the flu season.  No issues with her current medications and no problems with sleep unless stressed.  Past Medical History:  Diagnosis Date   Arthritis    all over   Chronic lower back pain    CKD Stage III 07/26/2018   Hx of hyperkalemia   Coronary artery disease    a. status post DES to the LAD 12/2015.   Glaucoma    HFpEF    Echocardiogram 12/2019: EF 60-65, no RWMA, mild LVH, Gr 1 DD, trivial MR, mild AoV sclerosis, no AS   Hyperkalemia    Hyperlipidemia    Hypertension    Mobitz (type) I (Wenckebach's) atrioventricular block 10/15/2021   Palpitations Zio Patch Monitor 5/21 NSR, avg HR 67, 1st degree AV block, intermittent Wenckebach during sleep, rare PACs/PVCs   Morbid obesity (HCC)    Palpitations    Type II diabetes mellitus (HCC)      Family History  Problem Relation Age of Onset   Heart disease Mother    Heart attack Mother    Hypertension Mother    Heart disease Sister    Cancer Sister    Stroke Sister     Current Medications[1]   Allergies[2]   Review of Systems  Constitutional:  Negative for chills, fatigue, fever and unexpected weight change.  Eyes:  Negative for blurred vision.  Respiratory:  Negative for shortness of breath.   Cardiovascular:  Negative for chest pain and palpitations.  Endocrine: Negative for polydipsia, polyphagia and polyuria.  Genitourinary:  Negative for decreased urine volume, difficulty urinating and dysuria.  Neurological:  Negative for dizziness and headaches.     Today's Vitals   09/14/24 0910  BP: 120/80  Pulse: 79  Temp: 98.9 F (37.2 C)  TempSrc: Oral  Weight: 299 lb 9.6 oz (135.9 kg)  Height: 5' 2 (1.575 m)  PainSc: 0-No pain   Body mass index is  54.8 kg/m.  Wt Readings from Last 3 Encounters:  09/14/24 299 lb 9.6 oz (135.9 kg)  07/05/24 293 lb 9.6 oz (133.2 kg)  03/08/24 (!) 304 lb 6.4 oz (138.1 kg)      Objective:  Physical Exam Vitals and nursing note reviewed.  Constitutional:      Appearance: She is obese.  HENT:     Head: Normocephalic and atraumatic.  Neck:     Vascular: No carotid bruit.  Cardiovascular:     Rate and Rhythm: Normal rate and regular rhythm.     Pulses: Normal pulses.     Heart sounds: Normal heart sounds.  Pulmonary:     Effort: Pulmonary effort is normal.     Breath sounds: Normal breath sounds.  Musculoskeletal:        General: Tenderness (left knee tenderness to lateral lower knee) present. No swelling.     Right lower leg: No edema.     Left lower leg: No edema.     Comments: Using a rollator walker and wearing support socks.   Skin:    General: Skin is  warm and dry.     Capillary Refill: Capillary refill takes less than 2 seconds.  Neurological:     General: No focal deficit present.     Mental Status: She is alert and oriented to person, place, and time.     Cranial Nerves: No cranial nerve deficit.     Motor: No weakness.  Psychiatric:        Mood and Affect: Mood normal.        Behavior: Behavior normal.        Thought Content: Thought content normal.        Judgment: Judgment normal.     Assessment And Plan:   Assessment & Plan Type 2 diabetes mellitus with stage 3b chronic kidney disease, without long-term current use of insulin  (HCC) Type 2 diabetes mellitus with improved glycemic control. Hemoglobin A1c decreased from 9.8% to 7.4% over the past year. Currently on Ozempic  and Tresiba . Blood sugar level was 134 mg/dL this morning. No issues with medication access. - Continue current diabetes medications (Ozempic  and Tresiba ). - Monitor blood sugar levels regularly. Mixed hyperlipidemia Cholesterol levels are stable, continue current medications.  Hypertensive  nephropathy Upcoming appointment with nephrologist in January. - Continue current management. - Follow up with nephrologist in January. Herpes zoster vaccination declined  Need for influenza vaccination Influenza vaccine administered Encouraged to take Tylenol  as needed for fever or muscle aches.  Acquired hypothyroidism TSH drawn, stable on levothyroxine  50 mcg daily.   Atherosclerosis of aorta Continue statin, tolerating well. Chronic heart failure with preserved ejection fraction (HCC) Chronic heart failure with preserved ejection fraction. Upcoming appointment with cardiologist in October. - Continue current management. - Follow up with cardiologist in October. Acute pain of left knee Aching pain, exacerbated by movement, rated 8/10 when stretching. Tenderness upon palpation. - Prescribed pain cream for knee pain. - Advised to report back if pain cream is ineffective. Decreased estrogen level   Orders Placed This Encounter  Procedures   DG Bone Density   Flu vaccine HIGH DOSE PF(Fluzone Trivalent)   Hemoglobin A1c   Lipid panel   BMP8+eGFR   TSH + free T4     Return for controlled DM check 4 months.  Patient was given opportunity to ask questions. Patient verbalized understanding of the plan and was able to repeat key elements of the plan. All questions were answered to their satisfaction.    LILLETTE Sarah Ada, FNP, have reviewed all documentation for this visit. The documentation on 09/14/2024 for the exam, diagnosis, procedures, and orders are all accurate and complete.   IF YOU HAVE BEEN REFERRED TO A SPECIALIST, IT MAY TAKE 1-2 WEEKS TO SCHEDULE/PROCESS THE REFERRAL. IF YOU HAVE NOT HEARD FROM US /SPECIALIST IN TWO WEEKS, PLEASE GIVE US  A CALL AT [phone].      [1]  Current Outpatient Medications:    Accu-Chek Softclix Lancets lancets, USE UP TO 4 TIMES DAILY AS DIRECTED, Disp: 100 each, Rfl: 3   aspirin  EC 81 MG tablet, Take 81 mg by mouth daily.,  Disp: , Rfl:    atorvastatin  (LIPITOR ) 80 MG tablet, TAKE 1 TABLET BY MOUTH EVERYDAY AT BEDTIME, Disp: 90 tablet, Rfl: 2   BD PEN NEEDLE NANO 2ND GEN 32G X 4 MM MISC, USE NIGHTLY AS DIRECTED, Disp: 100 each, Rfl: 3   blood glucose meter kit and supplies KIT, Dispense based on patient and insurance preference. Use up to four times daily as directed. (FOR ICD-9 250.00, 250.01)., Disp: 1 each, Rfl: 0  Cholecalciferol  125 MCG (5000 UT) TABS, Take 1 tablet by mouth daily., Disp: , Rfl:    Continuous Glucose Sensor (DEXCOM G7 SENSOR) MISC, USE AS DIRECTED TO CHECK BLOOD SUGARS, Disp: 3 each, Rfl: 3   diclofenac  Sodium (VOLTAREN ) 1 % GEL, Apply 2 g topically 4 (four) times daily., Disp: 100 g, Rfl: 2   empagliflozin  (JARDIANCE ) 10 MG TABS tablet, Take 1 tablet (10 mg total) by mouth daily before breakfast., Disp: 90 tablet, Rfl: 0   ezetimibe  (ZETIA ) 10 MG tablet, Take 1 tablet (10 mg total) by mouth daily., Disp: 90 tablet, Rfl: 3   furosemide  (LASIX ) 40 MG tablet, Take 1 tablet (40 mg total) by mouth daily., Disp: 90 tablet, Rfl: 3   insulin  degludec (TRESIBA  FLEXTOUCH) 100 UNIT/ML FlexTouch Pen, Inject 15 Units into the skin daily., Disp: , Rfl:    Latanoprostene Bunod 0.024 % SOLN, Apply 1 drop to eye daily., Disp: , Rfl:    levothyroxine  (SYNTHROID ) 50 MCG tablet, TAKE 1 TABLET BY MOUTH EVERY DAY BEFORE BREAKFAST, Disp: 90 tablet, Rfl: 1   linaclotide  (LINZESS ) 145 MCG CAPS capsule, TAKE 1 CAPSULE (145 MCG TOTAL) BY MOUTH DAILY BEFORE BREAKFAST. FOR CONSTIPATION, Disp: 90 capsule, Rfl: 2   losartan  (COZAAR ) 100 MG tablet, Take 100 mg by mouth at bedtime., Disp: , Rfl:    Magnesium  250 MG TABS, Take 1 tablet (250 mg total) by mouth every evening. Take with evening meal, Disp: 90 tablet, Rfl: 1   nebivolol  (BYSTOLIC ) 2.5 MG tablet, Take 1 tablet (2.5 mg total) by mouth daily., Disp: 90 tablet, Rfl: 3   nystatin  powder, Apply 1 Application topically 3 (three) times daily., Disp: 60 g, Rfl: 2   ONETOUCH  VERIO test strip, CHECK BLOOD SUGARS ONCE DAILY, Disp: 50 strip, Rfl: 11   Semaglutide ,0.25 or 0.5MG /DOS, (OZEMPIC , 0.25 OR 0.5 MG/DOSE,) 2 MG/3ML SOPN, Inject 0.5 mg into the skin once a week., Disp: 9 mL, Rfl: 2   timolol (TIMOPTIC) 0.5 % ophthalmic solution, 1 drop 3 (three) times daily., Disp: , Rfl:    traMADol -acetaminophen  (ULTRACET ) 37.5-325 MG tablet, TAKE 1 TABLET BY MOUTH EVERY 6 HOURS AS NEEDED FOR MODERATE BACK PAIN, Disp: 20 tablet, Rfl: 0   vitamin C (ASCORBIC ACID) 250 MG tablet, Take 250 mg by mouth daily., Disp: , Rfl:    Zinc Sulfate (ZINC-220 PO), Take 1 tablet by mouth daily., Disp: , Rfl:  [2]  Allergies Allergen Reactions   Carvedilol  Cough    Pt reports causes her to cough   Lisinopril  Cough    REPORTS CAUSES DRY COUGH

## 2024-09-23 ENCOUNTER — Ambulatory Visit

## 2024-09-23 DIAGNOSIS — Z0000 Encounter for general adult medical examination without abnormal findings: Secondary | ICD-10-CM

## 2024-09-23 NOTE — Progress Notes (Deleted)
 "  Chief Complaint  Patient presents with   Medicare Wellness     Subjective:   Sarah Weaver is a 89 y.o. female who presents for a Medicare Annual Wellness Visit.  Fall Screening Falls in the past year?: 0 Number of falls in past year: 0 Was there an injury with Fall?: 0 Fall Risk Category Calculator: 0 Patient Fall Risk Level: Low Fall Risk  Fall Risk Patient at Risk for Falls Due to: No Fall Risks Fall risk Follow up: Falls evaluation completed    Allergies (verified) Carvedilol  and Lisinopril    Current Medications (verified) Outpatient Encounter Medications as of 09/23/2024  Medication Sig   Accu-Chek Softclix Lancets lancets USE UP TO 4 TIMES DAILY AS DIRECTED   aspirin  EC 81 MG tablet Take 81 mg by mouth daily.   atorvastatin  (LIPITOR ) 80 MG tablet TAKE 1 TABLET BY MOUTH EVERYDAY AT BEDTIME   BD PEN NEEDLE NANO 2ND GEN 32G X 4 MM MISC USE NIGHTLY AS DIRECTED   blood glucose meter kit and supplies KIT Dispense based on patient and insurance preference. Use up to four times daily as directed. (FOR ICD-9 250.00, 250.01).   Cholecalciferol  125 MCG (5000 UT) TABS Take 1 tablet by mouth daily.   Continuous Glucose Sensor (DEXCOM G7 SENSOR) MISC USE AS DIRECTED TO CHECK BLOOD SUGARS   diclofenac  Sodium (VOLTAREN ) 1 % GEL Apply 2 g topically 4 (four) times daily.   empagliflozin  (JARDIANCE ) 10 MG TABS tablet Take 1 tablet (10 mg total) by mouth daily before breakfast.   ezetimibe  (ZETIA ) 10 MG tablet Take 1 tablet (10 mg total) by mouth daily.   furosemide  (LASIX ) 40 MG tablet Take 1 tablet (40 mg total) by mouth daily.   insulin  degludec (TRESIBA  FLEXTOUCH) 100 UNIT/ML FlexTouch Pen Inject 15 Units into the skin daily.   Latanoprostene Bunod 0.024 % SOLN Apply 1 drop to eye daily.   levothyroxine  (SYNTHROID ) 50 MCG tablet TAKE 1 TABLET BY MOUTH EVERY DAY BEFORE BREAKFAST   linaclotide  (LINZESS ) 145 MCG CAPS capsule TAKE 1 CAPSULE (145 MCG TOTAL) BY MOUTH DAILY  BEFORE BREAKFAST. FOR CONSTIPATION   losartan  (COZAAR ) 100 MG tablet Take 100 mg by mouth at bedtime.   Magnesium  250 MG TABS Take 1 tablet (250 mg total) by mouth every evening. Take with evening meal   nebivolol  (BYSTOLIC ) 2.5 MG tablet Take 1 tablet (2.5 mg total) by mouth daily.   nystatin  powder Apply 1 Application topically 3 (three) times daily.   ONETOUCH VERIO test strip CHECK BLOOD SUGARS ONCE DAILY   Semaglutide ,0.25 or 0.5MG /DOS, (OZEMPIC , 0.25 OR 0.5 MG/DOSE,) 2 MG/3ML SOPN Inject 0.5 mg into the skin once a week.   timolol (TIMOPTIC) 0.5 % ophthalmic solution 1 drop 3 (three) times daily.   traMADol -acetaminophen  (ULTRACET ) 37.5-325 MG tablet TAKE 1 TABLET BY MOUTH EVERY 6 HOURS AS NEEDED FOR MODERATE BACK PAIN   vitamin C (ASCORBIC ACID) 250 MG tablet Take 250 mg by mouth daily.   Zinc Sulfate (ZINC-220 PO) Take 1 tablet by mouth daily.   No facility-administered encounter medications on file as of 09/23/2024.    History: Past Medical History:  Diagnosis Date   Arthritis    all over   Chronic lower back pain    CKD Stage III 07/26/2018   Hx of hyperkalemia   Coronary artery disease    a. status post DES to the LAD 12/2015.   Glaucoma    HFpEF    Echocardiogram 12/2019: EF 60-65, no RWMA,  mild LVH, Gr 1 DD, trivial MR, mild AoV sclerosis, no AS   Hyperkalemia    Hyperlipidemia    Hypertension    Mobitz (type) I (Wenckebach's) atrioventricular block 10/15/2021   Palpitations Zio Patch Monitor 5/21 NSR, avg HR 67, 1st degree AV block, intermittent Wenckebach during sleep, rare PACs/PVCs   Morbid obesity (HCC)    Palpitations    Type II diabetes mellitus (HCC)    Past Surgical History:  Procedure Laterality Date   CARDIAC CATHETERIZATION N/A 12/20/2015   Procedure: Left Heart Cath and Coronary Angiography;  Surgeon: Dorn JINNY Lesches, MD;  Location: [facility];  Service: Cardiovascular;  Laterality: N/A;   CARDIAC CATHETERIZATION N/A 12/20/2015   Procedure:  Coronary Stent Intervention;  Surgeon: Dorn JINNY Lesches, MD;  Location: [facility];  Service: Cardiovascular;  Laterality: N/A;  mid LAD 2.5 x 20 Synergy   CATARACT EXTRACTION W/ INTRAOCULAR LENS  IMPLANT, BILATERAL Bilateral    CORONARY ANGIOPLASTY WITH STENT PLACEMENT  12/20/2015   LAPAROSCOPIC CHOLECYSTECTOMY  04/23/11   TONSILLECTOMY     Family History  Problem Relation Age of Onset   Heart disease Mother    Heart attack Mother    Hypertension Mother    Heart disease Sister    Cancer Sister    Stroke Sister    Social History   Occupational History   Occupation: retired  Tobacco Use   Smoking status: Never   Smokeless tobacco: Never  Vaping Use   Vaping status: Never Used  Substance and Sexual Activity   Alcohol use: No   Drug use: No   Sexual activity: Not Currently   Tobacco Counseling Counseling given: Not Answered  SDOH Screenings   Food Insecurity: No Food Insecurity (09/02/2023)  Housing: Unknown (09/02/2023)  Transportation Needs: No Transportation Needs (09/02/2023)  Utilities: Not At Risk (09/02/2023)  Alcohol Screen: Low Risk (09/02/2023)  Depression (PHQ2-9): Low Risk (09/14/2024)  Financial Resource Strain: Low Risk (09/02/2023)  Physical Activity: Insufficiently Active (09/02/2023)  Social Connections: Moderately Isolated (09/02/2023)  Stress: No Stress Concern Present (09/02/2023)  Tobacco Use: Low Risk (09/14/2024)  Health Literacy: Adequate Health Literacy (09/02/2023)   See flowsheets for full screening details  Depression Screen PHQ 2 & 9 Depression Scale- Over the past 2 weeks, how often have you been bothered by any of the following problems? Little interest or pleasure in doing things: 0 Feeling down, depressed, or hopeless (PHQ Adolescent also includes...irritable): 0 PHQ-2 Total Score: 0 Trouble falling or staying asleep, or sleeping too much: 0 Feeling tired or having little energy: 0 Poor appetite or overeating (PHQ Adolescent  also includes...weight loss): 0 Feeling bad about yourself - or that you are a failure or have let yourself or your family down: 0 Trouble concentrating on things, such as reading the newspaper or watching television (PHQ Adolescent also includes...like school work): 0 Moving or speaking so slowly that other people could have noticed. Or the opposite - being so fidgety or restless that you have been moving around a lot more than usual: 0 Thoughts that you would be better off dead, or of hurting yourself in some way: 0 PHQ-9 Total Score: 0 If you checked off any problems, how difficult have these problems made it for you to do your work, take care of things at home, or get along with other people?: Not difficult at all     Goals Addressed   None          Objective:  There were no vitals filed for this visit. There is no height or weight on file to calculate BMI.  Hearing/Vision screen No results found. Immunizations and Health Maintenance Health Maintenance  Topic Date Due   FOOT EXAM  08/20/2022   Medicare Annual Wellness (AWV)  09/01/2024   COVID-19 Vaccine (4 - 2025-26 season) 09/30/2024 (Originally 05/16/2024)   Zoster Vaccines- Shingrix (1 of 2) 12/13/2024 (Originally 10/09/1987)   OPHTHALMOLOGY EXAM  02/25/2025   HEMOGLOBIN A1C  03/14/2025   DTaP/Tdap/Td (2 - Td or Tdap) 09/22/2028   Pneumococcal Vaccine: 50+ Years  Completed   Influenza Vaccine  Completed   Bone Density Scan  Completed   Meningococcal B Vaccine  Aged Out        Assessment/Plan:  This is a routine wellness examination for Sarah Weaver.  Patient Care Team: Georgina Speaks, FNP as PCP - General (General Practice) Nahser, Aleene PARAS, MD (Inactive) as PCP - Cardiology (Cardiology) Pearson, Vallie J, RPH (Inactive) (Pharmacist) Lelon Glendia ONEIDA DEVONNA as Physician Assistant (Cardiology)  I have personally reviewed and noted the following in the patients chart:   Medical and social history Use of alcohol,  tobacco or illicit drugs  Current medications and supplements including opioid prescriptions. Functional ability and status Nutritional status Physical activity Advanced directives List of other physicians Hospitalizations, surgeries, and ER visits in previous 12 months Vitals Screenings to include cognitive, depression, and falls Referrals and appointments  No orders of the defined types were placed in this encounter.  In addition, I have reviewed and discussed with patient certain preventive protocols, quality metrics, and best practice recommendations. A written personalized care plan for preventive services as well as general preventive health recommendations were provided to patient.   Kristeen PARAS Lunger, CMA   09/23/2024   No follow-ups on file.  After Visit Summary: (MyChart) Due to this being a telephonic visit, the after visit summary with patients personalized plan was offered to patient via MyChart   Nurse Notes:  "

## 2024-09-25 ENCOUNTER — Ambulatory Visit: Payer: Self-pay | Admitting: Nurse Practitioner

## 2024-09-25 DIAGNOSIS — E1122 Type 2 diabetes mellitus with diabetic chronic kidney disease: Secondary | ICD-10-CM

## 2024-09-25 MED ORDER — SEMAGLUTIDE (1 MG/DOSE) 4 MG/3ML ~~LOC~~ SOPN: 1.0000 mg | PEN_INJECTOR | SUBCUTANEOUS | 1 refills | Status: AC

## 2024-09-25 NOTE — Assessment & Plan Note (Addendum)
 TSH drawn, stable on levothyroxine  50 mcg daily.

## 2024-09-25 NOTE — Assessment & Plan Note (Addendum)
 Cholesterol levels are stable, continue current medications.

## 2024-09-25 NOTE — Assessment & Plan Note (Signed)
 Upcoming appointment with nephrologist in January. - Continue current management. - Follow up with nephrologist in January.

## 2024-09-25 NOTE — Assessment & Plan Note (Addendum)
 Influenza vaccine administered Encouraged to take Tylenol as needed for fever or muscle aches.

## 2024-09-25 NOTE — Assessment & Plan Note (Signed)
 Type 2 diabetes mellitus with improved glycemic control. Hemoglobin A1c decreased from 9.8% to 7.4% over the past year. Currently on Ozempic  and Tresiba . Blood sugar level was 134 mg/dL this morning. No issues with medication access. - Continue current diabetes medications (Ozempic  and Tresiba ). - Monitor blood sugar levels regularly.

## 2024-09-25 NOTE — Assessment & Plan Note (Addendum)
 Continue statin, tolerating well

## 2024-09-30 ENCOUNTER — Other Ambulatory Visit: Payer: Self-pay

## 2024-09-30 NOTE — Patient Instructions (Signed)
 I connected with  Sarah Weaver on 09/30/24 by a audio enabled telemedicine application and verified that I am speaking with the correct person using two identifiers.  Patient Location: Home  Provider Location: Office/Clinic  Persons Participating in Visit: Patient.  I discussed the limitations of evaluation and management by telemedicine. The patient expressed understanding and agreed to proceed.   Vital Signs: Because this visit was a virtual/telehealth visit, some criteria may be missing or patient reported. Any vitals not documented were not able to be obtained and vitals that have been documented are patient reported.

## 2024-10-11 NOTE — Addendum Note (Signed)
 Addended by: GLADIS KRISTEEN PARAS on: 10/11/2024 03:12 PM   Modules accepted: Orders, Level of Service

## 2024-10-11 NOTE — Progress Notes (Signed)
 "  Chief Complaint  Patient presents with   Medicare Wellness     Subjective:   Sarah Weaver is a 89 y.o. female who presents for a Medicare Annual Wellness Visit.  Visit info / Clinical Intake: Medicare Wellness Visit Type:: Subsequent Annual Wellness Visit Persons participating in visit and providing information:: patient Medicare Wellness Visit Mode:: Telephone If telephone:: video declined Since this visit was completed virtually, some vitals may be partially provided or unavailable. Missing vitals are due to the limitations of the virtual format.: Unable to obtain vitals - no equipment If Telephone or Video please confirm:: I connected with patient using audio/video enable telemedicine. I verified patient identity with two identifiers, discussed telehealth limitations, and patient agreed to proceed. Patient Location:: home Provider Location:: office Interpreter Needed?: No Pre-visit prep was completed: yes AWV questionnaire completed by patient prior to visit?: no Living arrangements:: with family/others Patient's Overall Health Status Rating: (!) fair Typical amount of pain: none Does pain affect daily life?: (!) yes Are you currently prescribed opioids?: no  Dietary Habits and Nutritional Risks How many meals a day?: 2 Eats fruit and vegetables daily?: yes Most meals are obtained by: having others provide food In the last 2 weeks, have you had any of the following?: none Diabetic:: (!) yes Any non-healing wounds?: no How often do you check your BS?: continuous glucose monitor Would you like to be referred to a Nutritionist or for Diabetic Management? : no  Functional Status Activities of Daily Living (to include ambulation/medication): Independent Ambulation: Independent Medication Administration: Independent Home Management (perform basic housework or laundry): Independent Manage your own finances?: yes Primary transportation is: family /  friends Concerns about vision?: no *vision screening is required for WTM* Concerns about hearing?: no  Fall Screening Falls in the past year?: 0 Number of falls in past year: 0 Was there an injury with Fall?: 0 Fall Risk Category Calculator: 0 Patient Fall Risk Level: Low Fall Risk  Fall Risk Patient at Risk for Falls Due to: No Fall Risks Fall risk Follow up: Falls evaluation completed; Education provided; Falls prevention discussed  Home and Transportation Safety: All rugs have non-skid backing?: N/A, no rugs All stairs or steps have railings?: N/A, no stairs Grab bars in the bathtub or shower?: (!) no Have non-skid surface in bathtub or shower?: yes Good home lighting?: yes Regular seat belt use?: yes Hospital stays in the last year:: no  Cognitive Assessment Difficulty concentrating, remembering, or making decisions? : no Will 6CIT or Mini Cog be Completed: yes What year is it?: 0 points What month is it?: 0 points Give patient an address phrase to remember (5 components): [address] Sarah Weaver About what time is it?: 3 points Count backwards from 20 to 1: 2 points Say the months of the year in reverse: 0 points Repeat the address phrase from earlier: 4 points 6 CIT Score: 9 points  Advance Directives (For Healthcare) Does Patient Have a Medical Advance Directive?: Yes Does patient want to make changes to medical advance directive?: Yes (ED - send information to MyChart) Type of Advance Directive: Living will Copy of Living Will in Chart?: No - copy requested Living Will Requested and Now in Chart: Copy in chart  Reviewed/Updated  Reviewed/Updated: Reviewed All (Medical, Surgical, Family, Medications, Allergies, Care Teams, Patient Goals); Surgical History; Medical History; Family History; Medications; Allergies; Care Teams; Patient Goals    Allergies (verified) Carvedilol  and Lisinopril    Current Medications (verified) Outpatient Encounter Medications as of  09/23/2024  Medication Sig   Accu-Chek Softclix Lancets lancets USE UP TO 4 TIMES DAILY AS DIRECTED   aspirin  EC 81 MG tablet Take 81 mg by mouth daily.   atorvastatin  (LIPITOR ) 80 MG tablet TAKE 1 TABLET BY MOUTH EVERYDAY AT BEDTIME   BD PEN NEEDLE NANO 2ND GEN 32G X 4 MM MISC USE NIGHTLY AS DIRECTED   blood glucose meter kit and supplies KIT Dispense based on patient and insurance preference. Use up to four times daily as directed. (FOR ICD-9 250.00, 250.01).   Cholecalciferol  125 MCG (5000 UT) TABS Take 1 tablet by mouth daily.   Continuous Glucose Sensor (DEXCOM G7 SENSOR) MISC USE AS DIRECTED TO CHECK BLOOD SUGARS   diclofenac  Sodium (VOLTAREN ) 1 % GEL Apply 2 g topically 4 (four) times daily.   empagliflozin  (JARDIANCE ) 10 MG TABS tablet Take 1 tablet (10 mg total) by mouth daily before breakfast.   ezetimibe  (ZETIA ) 10 MG tablet Take 1 tablet (10 mg total) by mouth daily.   furosemide  (LASIX ) 40 MG tablet Take 1 tablet (40 mg total) by mouth daily.   insulin  degludec (TRESIBA  FLEXTOUCH) 100 UNIT/ML FlexTouch Pen Inject 15 Units into the skin daily.   Latanoprostene Bunod 0.024 % SOLN Apply 1 drop to eye daily.   levothyroxine  (SYNTHROID ) 50 MCG tablet TAKE 1 TABLET BY MOUTH EVERY DAY BEFORE BREAKFAST   linaclotide  (LINZESS ) 145 MCG CAPS capsule TAKE 1 CAPSULE (145 MCG TOTAL) BY MOUTH DAILY BEFORE BREAKFAST. FOR CONSTIPATION   losartan  (COZAAR ) 100 MG tablet Take 100 mg by mouth at bedtime.   Magnesium  250 MG TABS Take 1 tablet (250 mg total) by mouth every evening. Take with evening meal   nebivolol  (BYSTOLIC ) 2.5 MG tablet Take 1 tablet (2.5 mg total) by mouth daily.   nystatin  powder Apply 1 Application topically 3 (three) times daily.   ONETOUCH VERIO test strip CHECK BLOOD SUGARS ONCE DAILY   timolol (TIMOPTIC) 0.5 % ophthalmic solution 1 drop 3 (three) times daily.   traMADol -acetaminophen  (ULTRACET ) 37.5-325 MG tablet TAKE 1 TABLET BY MOUTH EVERY 6 HOURS AS NEEDED FOR MODERATE BACK  PAIN   vitamin C (ASCORBIC ACID) 250 MG tablet Take 250 mg by mouth daily.   Zinc Sulfate (ZINC-220 PO) Take 1 tablet by mouth daily.   [DISCONTINUED] Semaglutide ,0.25 or 0.5MG /DOS, (OZEMPIC , 0.25 OR 0.5 MG/DOSE,) 2 MG/3ML SOPN Inject 0.5 mg into the skin once a week.   No facility-administered encounter medications on file as of 09/23/2024.    History: Past Medical History:  Diagnosis Date   Arthritis    all over   Chronic lower back pain    CKD Stage III 07/26/2018   Hx of hyperkalemia   Coronary artery disease    a. status post DES to the LAD 12/2015.   Glaucoma    HFpEF    Echocardiogram 12/2019: EF 60-65, no RWMA, mild LVH, Gr 1 DD, trivial MR, mild AoV sclerosis, no AS   Hyperkalemia    Hyperlipidemia    Hypertension    Mobitz (type) I (Wenckebach's) atrioventricular block 10/15/2021   Palpitations Zio Patch Monitor 5/21 NSR, avg HR 67, 1st degree AV block, intermittent Wenckebach during sleep, rare PACs/PVCs   Morbid obesity (HCC)    Palpitations    Type II diabetes mellitus (HCC)    Past Surgical History:  Procedure Laterality Date   CARDIAC CATHETERIZATION N/A 12/20/2015   Procedure: Left Heart Cath and Coronary Angiography;  Surgeon: Dorn JINNY Lesches, MD;  [facility] INVASIVE CV  LAB;  Service: Cardiovascular;  Laterality: N/A;   CARDIAC CATHETERIZATION N/A 12/20/2015   Procedure: Coronary Stent Intervention;  Surgeon: Dorn JINNY Lesches, MD;  Location: [facility] INVASIVE CV LAB;  Service: Cardiovascular;  Laterality: N/A;  mid LAD 2.5 x 20 Synergy   CATARACT EXTRACTION W/ INTRAOCULAR LENS  IMPLANT, BILATERAL Bilateral    CORONARY ANGIOPLASTY WITH STENT PLACEMENT  12/20/2015   LAPAROSCOPIC CHOLECYSTECTOMY  04/23/11   TONSILLECTOMY     Family History  Problem Relation Age of Onset   Heart disease Mother    Heart attack Mother    Hypertension Mother    Heart disease Sister    Cancer Sister    Stroke Sister    Social History   Occupational History   Occupation: retired   Tobacco Use   Smoking status: Never   Smokeless tobacco: Never  Vaping Use   Vaping status: Never Used  Substance and Sexual Activity   Alcohol use: No   Drug use: No   Sexual activity: Not Currently   Tobacco Counseling Counseling given: Not Answered  SDOH Screenings   Food Insecurity: No Food Insecurity (09/23/2024)  Housing: Low Risk (09/23/2024)  Transportation Needs: No Transportation Needs (09/23/2024)  Utilities: Not At Risk (09/23/2024)  Alcohol Screen: Low Risk (09/23/2024)  Depression (PHQ2-9): Low Risk (09/23/2024)  Financial Resource Strain: Low Risk (09/23/2024)  Physical Activity: Inactive (09/23/2024)  Social Connections: Moderately Isolated (09/23/2024)  Stress: No Stress Concern Present (09/23/2024)  Tobacco Use: Low Risk (09/23/2024)  Health Literacy: Adequate Health Literacy (09/23/2024)   See flowsheets for full screening details  Depression Screen PHQ 2 & 9 Depression Scale- Over the past 2 weeks, how often have you been bothered by any of the following problems? Little interest or pleasure in doing things: 0 Feeling down, depressed, or hopeless (PHQ Adolescent also includes...irritable): 0 PHQ-2 Total Score: 0 Trouble falling or staying asleep, or sleeping too much: 0 Feeling tired or having little energy: 0 Poor appetite or overeating (PHQ Adolescent also includes...weight loss): 0 Feeling bad about yourself - or that you are a failure or have let yourself or your family down: 0 Trouble concentrating on things, such as reading the newspaper or watching television (PHQ Adolescent also includes...like school work): 0 Moving or speaking so slowly that other people could have noticed. Or the opposite - being so fidgety or restless that you have been moving around a lot more than usual: 0 Thoughts that you would be better off dead, or of hurting yourself in some way: 0 PHQ-9 Total Score: 0 If you checked off any problems, how difficult have these problems made it for you to do  your work, take care of things at home, or get along with other people?: Not difficult at all     Goals Addressed               This Visit's Progress     Patient Stated (pt-stated)        No goals              Objective:    Today's Vitals   09/23/24 1003  PainSc: 6    There is no height or weight on file to calculate BMI.  Hearing/Vision screen No results found. Immunizations and Health Maintenance Health Maintenance  Topic Date Due   FOOT EXAM  08/20/2022   COVID-19 Vaccine (4 - 2025-26 season) 05/16/2024   Zoster Vaccines- Shingrix (1 of 2) 12/13/2024 (Originally 10/09/1985)   OPHTHALMOLOGY EXAM  02/25/2025  HEMOGLOBIN A1C  03/14/2025   Medicare Annual Wellness (AWV)  09/23/2025   DTaP/Tdap/Td (2 - Td or Tdap) 09/22/2028   Pneumococcal Vaccine: 50+ Years  Completed   Influenza Vaccine  Completed   Bone Density Scan  Completed   Meningococcal B Vaccine  Aged Out        Assessment/Plan:  This is a routine wellness examination for Sarah Weaver.  Patient Care Team: Georgina Speaks, FNP as PCP - General (General Practice) Nahser, Aleene PARAS, MD (Inactive) as PCP - Cardiology (Cardiology) Pearson, Vallie J, RPH (Inactive) (Pharmacist) Lelon Glendia ONEIDA DEVONNA as Physician Assistant (Cardiology)  I have personally reviewed and noted the following in the patients chart:   Medical and social history Use of alcohol, tobacco or illicit drugs  Current medications and supplements including opioid prescriptions. Functional ability and status Nutritional status Physical activity Advanced directives List of other physicians Hospitalizations, surgeries, and ER visits in previous 12 months Vitals Screenings to include cognitive, depression, and falls Referrals and appointments  No orders of the defined types were placed in this encounter.  In addition, I have reviewed and discussed with patient certain preventive protocols, quality metrics, and best practice  recommendations. A written personalized care plan for preventive services as well as general preventive health recommendations were provided to patient.   Sarah Weaver, CMA   10/11/2024   Return in 1 year (on 09/23/2025).  After Visit Summary: (MyChart) Due to this being a telephonic visit, the after visit summary with patients personalized plan was offered to patient via MyChart   Nurse Notes:  "

## 2025-01-02 ENCOUNTER — Ambulatory Visit: Payer: Self-pay | Admitting: Nurse Practitioner

## 2025-09-27 ENCOUNTER — Ambulatory Visit: Payer: Self-pay
